# Patient Record
Sex: Male | Born: 1960 | Race: White | Hispanic: No | Smoking: Never smoker
Health system: Southern US, Community
[De-identification: ages and names within clinical notes are randomized; demographics above are authoritative.]

## PROBLEM LIST (undated history)

## (undated) DIAGNOSIS — R030 Elevated blood-pressure reading, without diagnosis of hypertension: Secondary | ICD-10-CM

## (undated) DIAGNOSIS — R7301 Impaired fasting glucose: Secondary | ICD-10-CM

## (undated) DIAGNOSIS — E782 Mixed hyperlipidemia: Secondary | ICD-10-CM

## (undated) DIAGNOSIS — K219 Gastro-esophageal reflux disease without esophagitis: Secondary | ICD-10-CM

## (undated) DIAGNOSIS — I10 Essential (primary) hypertension: Secondary | ICD-10-CM

## (undated) DIAGNOSIS — R972 Elevated prostate specific antigen [PSA]: Secondary | ICD-10-CM

## (undated) DIAGNOSIS — I639 Cerebral infarction, unspecified: Secondary | ICD-10-CM

## (undated) DIAGNOSIS — R079 Chest pain, unspecified: Secondary | ICD-10-CM

## (undated) HISTORY — DX: Chest pain, unspecified: R07.9

## (undated) HISTORY — DX: Elevated blood-pressure reading, without diagnosis of hypertension: R03.0

## (undated) HISTORY — DX: Mixed hyperlipidemia: E78.2

## (undated) HISTORY — DX: Elevated prostate specific antigen (PSA): R97.20

## (undated) HISTORY — DX: Impaired fasting glucose: R73.01

---

## 2017-10-24 DIAGNOSIS — R079 Chest pain, unspecified: Secondary | ICD-10-CM | POA: Insufficient documentation

## 2017-10-24 DIAGNOSIS — R7301 Impaired fasting glucose: Secondary | ICD-10-CM

## 2017-10-24 DIAGNOSIS — E782 Mixed hyperlipidemia: Secondary | ICD-10-CM

## 2017-10-24 DIAGNOSIS — R030 Elevated blood-pressure reading, without diagnosis of hypertension: Secondary | ICD-10-CM

## 2017-10-24 DIAGNOSIS — R972 Elevated prostate specific antigen [PSA]: Secondary | ICD-10-CM

## 2017-10-24 HISTORY — DX: Impaired fasting glucose: R73.01

## 2017-10-24 HISTORY — DX: Elevated prostate specific antigen (PSA): R97.20

## 2017-10-24 HISTORY — DX: Chest pain, unspecified: R07.9

## 2017-10-24 HISTORY — DX: Mixed hyperlipidemia: E78.2

## 2017-10-24 HISTORY — DX: Elevated blood-pressure reading, without diagnosis of hypertension: R03.0

## 2017-10-25 ENCOUNTER — Ambulatory Visit: Payer: Self-pay | Admitting: Cardiology

## 2021-12-25 ENCOUNTER — Inpatient Hospital Stay (HOSPITAL_COMMUNITY)
Admission: EM | Admit: 2021-12-25 | Discharge: 2022-01-15 | DRG: 003 | Disposition: A | Discharge status: Other Acute Inpatient Hospital | Payer: Managed Care, Other (non HMO) | Attending: Internal Medicine | Admitting: Internal Medicine

## 2021-12-25 ENCOUNTER — Inpatient Hospital Stay (HOSPITAL_COMMUNITY): Payer: Managed Care, Other (non HMO)

## 2021-12-25 ENCOUNTER — Emergency Department (HOSPITAL_COMMUNITY): Payer: Managed Care, Other (non HMO) | Admitting: Certified Registered Nurse Anesthetist

## 2021-12-25 ENCOUNTER — Emergency Department (HOSPITAL_COMMUNITY): Payer: Managed Care, Other (non HMO)

## 2021-12-25 ENCOUNTER — Encounter (HOSPITAL_COMMUNITY)
Admission: EM | Disposition: A | Discharge status: Nursing Home (Includes ICF) | Payer: Self-pay | Source: Home / Self Care | Attending: Neurology

## 2021-12-25 DIAGNOSIS — K219 Gastro-esophageal reflux disease without esophagitis: Secondary | ICD-10-CM | POA: Diagnosis present

## 2021-12-25 DIAGNOSIS — N170 Acute kidney failure with tubular necrosis: Secondary | ICD-10-CM | POA: Diagnosis present

## 2021-12-25 DIAGNOSIS — G8194 Hemiplegia, unspecified affecting left nondominant side: Secondary | ICD-10-CM | POA: Diagnosis not present

## 2021-12-25 DIAGNOSIS — R509 Fever, unspecified: Secondary | ICD-10-CM | POA: Diagnosis not present

## 2021-12-25 DIAGNOSIS — D62 Acute posthemorrhagic anemia: Secondary | ICD-10-CM | POA: Diagnosis present

## 2021-12-25 DIAGNOSIS — D649 Anemia, unspecified: Secondary | ICD-10-CM | POA: Diagnosis not present

## 2021-12-25 DIAGNOSIS — N400 Enlarged prostate without lower urinary tract symptoms: Secondary | ICD-10-CM | POA: Diagnosis present

## 2021-12-25 DIAGNOSIS — F419 Anxiety disorder, unspecified: Secondary | ICD-10-CM | POA: Diagnosis present

## 2021-12-25 DIAGNOSIS — R7303 Prediabetes: Secondary | ICD-10-CM | POA: Diagnosis present

## 2021-12-25 DIAGNOSIS — R414 Neurologic neglect syndrome: Secondary | ICD-10-CM | POA: Diagnosis present

## 2021-12-25 DIAGNOSIS — I330 Acute and subacute infective endocarditis: Secondary | ICD-10-CM | POA: Diagnosis present

## 2021-12-25 DIAGNOSIS — Z6830 Body mass index (BMI) 30.0-30.9, adult: Secondary | ICD-10-CM

## 2021-12-25 DIAGNOSIS — D500 Iron deficiency anemia secondary to blood loss (chronic): Secondary | ICD-10-CM | POA: Diagnosis not present

## 2021-12-25 DIAGNOSIS — I10 Essential (primary) hypertension: Secondary | ICD-10-CM | POA: Diagnosis present

## 2021-12-25 DIAGNOSIS — R5381 Other malaise: Secondary | ICD-10-CM | POA: Diagnosis present

## 2021-12-25 DIAGNOSIS — R233 Spontaneous ecchymoses: Secondary | ICD-10-CM | POA: Diagnosis not present

## 2021-12-25 DIAGNOSIS — R972 Elevated prostate specific antigen [PSA]: Secondary | ICD-10-CM | POA: Diagnosis not present

## 2021-12-25 DIAGNOSIS — B9561 Methicillin susceptible Staphylococcus aureus infection as the cause of diseases classified elsewhere: Secondary | ICD-10-CM | POA: Diagnosis not present

## 2021-12-25 DIAGNOSIS — G9340 Encephalopathy, unspecified: Secondary | ICD-10-CM | POA: Diagnosis present

## 2021-12-25 DIAGNOSIS — K567 Ileus, unspecified: Secondary | ICD-10-CM | POA: Diagnosis present

## 2021-12-25 DIAGNOSIS — I248 Other forms of acute ischemic heart disease: Secondary | ICD-10-CM | POA: Diagnosis present

## 2021-12-25 DIAGNOSIS — R34 Anuria and oliguria: Secondary | ICD-10-CM | POA: Diagnosis not present

## 2021-12-25 DIAGNOSIS — D72829 Elevated white blood cell count, unspecified: Secondary | ICD-10-CM

## 2021-12-25 DIAGNOSIS — R6521 Severe sepsis with septic shock: Secondary | ICD-10-CM | POA: Diagnosis present

## 2021-12-25 DIAGNOSIS — E669 Obesity, unspecified: Secondary | ICD-10-CM | POA: Diagnosis present

## 2021-12-25 DIAGNOSIS — I63411 Cerebral infarction due to embolism of right middle cerebral artery: Secondary | ICD-10-CM | POA: Diagnosis present

## 2021-12-25 DIAGNOSIS — Z930 Tracheostomy status: Secondary | ICD-10-CM | POA: Diagnosis not present

## 2021-12-25 DIAGNOSIS — R197 Diarrhea, unspecified: Secondary | ICD-10-CM | POA: Diagnosis present

## 2021-12-25 DIAGNOSIS — R5082 Postprocedural fever: Secondary | ICD-10-CM | POA: Diagnosis not present

## 2021-12-25 DIAGNOSIS — I38 Endocarditis, valve unspecified: Secondary | ICD-10-CM | POA: Diagnosis not present

## 2021-12-25 DIAGNOSIS — I639 Cerebral infarction, unspecified: Secondary | ICD-10-CM | POA: Diagnosis not present

## 2021-12-25 DIAGNOSIS — K56609 Unspecified intestinal obstruction, unspecified as to partial versus complete obstruction: Secondary | ICD-10-CM | POA: Diagnosis not present

## 2021-12-25 DIAGNOSIS — J189 Pneumonia, unspecified organism: Secondary | ICD-10-CM | POA: Diagnosis not present

## 2021-12-25 DIAGNOSIS — I63511 Cerebral infarction due to unspecified occlusion or stenosis of right middle cerebral artery: Secondary | ICD-10-CM

## 2021-12-25 DIAGNOSIS — N412 Abscess of prostate: Secondary | ICD-10-CM | POA: Diagnosis present

## 2021-12-25 DIAGNOSIS — E875 Hyperkalemia: Secondary | ICD-10-CM | POA: Diagnosis present

## 2021-12-25 DIAGNOSIS — K449 Diaphragmatic hernia without obstruction or gangrene: Secondary | ICD-10-CM | POA: Diagnosis present

## 2021-12-25 DIAGNOSIS — Z20822 Contact with and (suspected) exposure to covid-19: Secondary | ICD-10-CM | POA: Diagnosis present

## 2021-12-25 DIAGNOSIS — A4101 Sepsis due to Methicillin susceptible Staphylococcus aureus: Secondary | ICD-10-CM | POA: Diagnosis present

## 2021-12-25 DIAGNOSIS — K317 Polyp of stomach and duodenum: Secondary | ICD-10-CM | POA: Diagnosis present

## 2021-12-25 DIAGNOSIS — E878 Other disorders of electrolyte and fluid balance, not elsewhere classified: Secondary | ICD-10-CM | POA: Diagnosis not present

## 2021-12-25 DIAGNOSIS — R471 Dysarthria and anarthria: Secondary | ICD-10-CM | POA: Diagnosis present

## 2021-12-25 DIAGNOSIS — D638 Anemia in other chronic diseases classified elsewhere: Secondary | ICD-10-CM | POA: Diagnosis present

## 2021-12-25 DIAGNOSIS — I059 Rheumatic mitral valve disease, unspecified: Secondary | ICD-10-CM | POA: Diagnosis not present

## 2021-12-25 DIAGNOSIS — K2971 Gastritis, unspecified, with bleeding: Secondary | ICD-10-CM | POA: Diagnosis present

## 2021-12-25 DIAGNOSIS — I959 Hypotension, unspecified: Secondary | ICD-10-CM | POA: Diagnosis not present

## 2021-12-25 DIAGNOSIS — R4701 Aphasia: Secondary | ICD-10-CM | POA: Diagnosis present

## 2021-12-25 DIAGNOSIS — J90 Pleural effusion, not elsewhere classified: Secondary | ICD-10-CM | POA: Diagnosis present

## 2021-12-25 DIAGNOSIS — I340 Nonrheumatic mitral (valve) insufficiency: Secondary | ICD-10-CM | POA: Diagnosis not present

## 2021-12-25 DIAGNOSIS — I6601 Occlusion and stenosis of right middle cerebral artery: Secondary | ICD-10-CM | POA: Diagnosis present

## 2021-12-25 DIAGNOSIS — N179 Acute kidney failure, unspecified: Secondary | ICD-10-CM | POA: Diagnosis present

## 2021-12-25 DIAGNOSIS — E8770 Fluid overload, unspecified: Secondary | ICD-10-CM | POA: Diagnosis not present

## 2021-12-25 DIAGNOSIS — R29700 NIHSS score 0: Secondary | ICD-10-CM | POA: Diagnosis present

## 2021-12-25 DIAGNOSIS — I63442 Cerebral infarction due to embolism of left cerebellar artery: Secondary | ICD-10-CM | POA: Diagnosis present

## 2021-12-25 DIAGNOSIS — K921 Melena: Secondary | ICD-10-CM | POA: Diagnosis not present

## 2021-12-25 DIAGNOSIS — Z7982 Long term (current) use of aspirin: Secondary | ICD-10-CM

## 2021-12-25 DIAGNOSIS — I611 Nontraumatic intracerebral hemorrhage in hemisphere, cortical: Secondary | ICD-10-CM | POA: Diagnosis present

## 2021-12-25 DIAGNOSIS — H53462 Homonymous bilateral field defects, left side: Secondary | ICD-10-CM | POA: Diagnosis present

## 2021-12-25 DIAGNOSIS — E43 Unspecified severe protein-calorie malnutrition: Secondary | ICD-10-CM | POA: Diagnosis present

## 2021-12-25 DIAGNOSIS — J9601 Acute respiratory failure with hypoxia: Secondary | ICD-10-CM | POA: Diagnosis present

## 2021-12-25 DIAGNOSIS — R000 Tachycardia, unspecified: Secondary | ICD-10-CM | POA: Diagnosis present

## 2021-12-25 DIAGNOSIS — E782 Mixed hyperlipidemia: Secondary | ICD-10-CM | POA: Diagnosis present

## 2021-12-25 DIAGNOSIS — J690 Pneumonitis due to inhalation of food and vomit: Secondary | ICD-10-CM | POA: Diagnosis present

## 2021-12-25 DIAGNOSIS — R1310 Dysphagia, unspecified: Secondary | ICD-10-CM | POA: Diagnosis present

## 2021-12-25 DIAGNOSIS — Z79899 Other long term (current) drug therapy: Secondary | ICD-10-CM

## 2021-12-25 DIAGNOSIS — K648 Other hemorrhoids: Secondary | ICD-10-CM | POA: Diagnosis present

## 2021-12-25 DIAGNOSIS — R609 Edema, unspecified: Secondary | ICD-10-CM | POA: Diagnosis not present

## 2021-12-25 DIAGNOSIS — I339 Acute and subacute endocarditis, unspecified: Secondary | ICD-10-CM | POA: Diagnosis not present

## 2021-12-25 DIAGNOSIS — A419 Sepsis, unspecified organism: Secondary | ICD-10-CM | POA: Diagnosis not present

## 2021-12-25 DIAGNOSIS — M25461 Effusion, right knee: Secondary | ICD-10-CM | POA: Diagnosis present

## 2021-12-25 DIAGNOSIS — E861 Hypovolemia: Secondary | ICD-10-CM | POA: Diagnosis present

## 2021-12-25 DIAGNOSIS — K922 Gastrointestinal hemorrhage, unspecified: Secondary | ICD-10-CM | POA: Diagnosis not present

## 2021-12-25 DIAGNOSIS — R739 Hyperglycemia, unspecified: Secondary | ICD-10-CM | POA: Diagnosis present

## 2021-12-25 DIAGNOSIS — I3139 Other pericardial effusion (noninflammatory): Secondary | ICD-10-CM | POA: Diagnosis not present

## 2021-12-25 DIAGNOSIS — I76 Septic arterial embolism: Secondary | ICD-10-CM | POA: Diagnosis present

## 2021-12-25 DIAGNOSIS — G9341 Metabolic encephalopathy: Secondary | ICD-10-CM | POA: Diagnosis present

## 2021-12-25 DIAGNOSIS — F05 Delirium due to known physiological condition: Secondary | ICD-10-CM | POA: Diagnosis not present

## 2021-12-25 DIAGNOSIS — E870 Hyperosmolality and hypernatremia: Secondary | ICD-10-CM | POA: Diagnosis not present

## 2021-12-25 DIAGNOSIS — K3189 Other diseases of stomach and duodenum: Secondary | ICD-10-CM | POA: Diagnosis not present

## 2021-12-25 DIAGNOSIS — J9811 Atelectasis: Secondary | ICD-10-CM | POA: Diagnosis not present

## 2021-12-25 DIAGNOSIS — E8809 Other disorders of plasma-protein metabolism, not elsewhere classified: Secondary | ICD-10-CM | POA: Diagnosis present

## 2021-12-25 DIAGNOSIS — K56600 Partial intestinal obstruction, unspecified as to cause: Secondary | ICD-10-CM | POA: Diagnosis present

## 2021-12-25 DIAGNOSIS — H538 Other visual disturbances: Secondary | ICD-10-CM | POA: Diagnosis present

## 2021-12-25 DIAGNOSIS — R29810 Facial weakness: Secondary | ICD-10-CM | POA: Diagnosis present

## 2021-12-25 DIAGNOSIS — J181 Lobar pneumonia, unspecified organism: Secondary | ICD-10-CM | POA: Diagnosis not present

## 2021-12-25 HISTORY — PX: IR PERCUTANEOUS ART THROMBECTOMY/INFUSION INTRACRANIAL INC DIAG ANGIO: IMG6087

## 2021-12-25 HISTORY — PX: IR CT HEAD LTD: IMG2386

## 2021-12-25 HISTORY — PX: RADIOLOGY WITH ANESTHESIA: SHX6223

## 2021-12-25 LAB — PROCALCITONIN: Procalcitonin: 2.95 ng/mL

## 2021-12-25 LAB — PROTIME-INR
INR: 1.2 (ref 0.8–1.2)
INR: 1.3 — ABNORMAL HIGH (ref 0.8–1.2)
Prothrombin Time: 14.9 seconds (ref 11.4–15.2)
Prothrombin Time: 15.9 seconds — ABNORMAL HIGH (ref 11.4–15.2)

## 2021-12-25 LAB — DIFFERENTIAL
Abs Immature Granulocytes: 0.71 10*3/uL — ABNORMAL HIGH (ref 0.00–0.07)
Basophils Absolute: 0.1 10*3/uL (ref 0.0–0.1)
Basophils Relative: 0 %
Eosinophils Absolute: 0.1 10*3/uL (ref 0.0–0.5)
Eosinophils Relative: 0 %
Immature Granulocytes: 4 %
Lymphocytes Relative: 7 %
Lymphs Abs: 1.4 10*3/uL (ref 0.7–4.0)
Monocytes Absolute: 1.4 10*3/uL — ABNORMAL HIGH (ref 0.1–1.0)
Monocytes Relative: 7 %
Neutro Abs: 16.3 10*3/uL — ABNORMAL HIGH (ref 1.7–7.7)
Neutrophils Relative %: 82 %

## 2021-12-25 LAB — POCT I-STAT 7, (LYTES, BLD GAS, ICA,H+H)
Acid-base deficit: 1 mmol/L (ref 0.0–2.0)
Acid-base deficit: 2 mmol/L (ref 0.0–2.0)
Bicarbonate: 22 mmol/L (ref 20.0–28.0)
Bicarbonate: 21.5 mmol/L (ref 20.0–28.0)
Calcium, Ion: 1 mmol/L — ABNORMAL LOW (ref 1.15–1.40)
Calcium, Ion: 0.95 mmol/L — ABNORMAL LOW (ref 1.15–1.40)
HCT: 19 % — ABNORMAL LOW (ref 39.0–52.0)
HCT: 20 % — ABNORMAL LOW (ref 39.0–52.0)
Hemoglobin: 6.5 g/dL — CL (ref 13.0–17.0)
Hemoglobin: 6.8 g/dL — CL (ref 13.0–17.0)
O2 Saturation: 100 %
O2 Saturation: 100 %
Patient temperature: 98.6
Patient temperature: 98.6
Potassium: 6.1 mmol/L — ABNORMAL HIGH (ref 3.5–5.1)
Potassium: 5.7 mmol/L — ABNORMAL HIGH (ref 3.5–5.1)
Sodium: 137 mmol/L (ref 135–145)
Sodium: 137 mmol/L (ref 135–145)
TCO2: 23 mmol/L (ref 22–32)
TCO2: 22 mmol/L (ref 22–32)
pCO2 arterial: 29.4 mmHg — ABNORMAL LOW (ref 32–48)
pCO2 arterial: 30.4 mmHg — ABNORMAL LOW (ref 32–48)
pH, Arterial: 7.482 — ABNORMAL HIGH (ref 7.35–7.45)
pH, Arterial: 7.457 — ABNORMAL HIGH (ref 7.35–7.45)
pO2, Arterial: 405 mmHg — ABNORMAL HIGH (ref 83–108)
pO2, Arterial: 177 mmHg — ABNORMAL HIGH (ref 83–108)

## 2021-12-25 LAB — TROPONIN I (HIGH SENSITIVITY)
Troponin I (High Sensitivity): 337 ng/L (ref <18)
Troponin I (High Sensitivity): 315 ng/L (ref <18)

## 2021-12-25 LAB — BASIC METABOLIC PANEL
Anion gap: 6 (ref 5–15)
Anion gap: 8 (ref 5–15)
BUN: 36 mg/dL — ABNORMAL HIGH (ref 6–20)
BUN: 40 mg/dL — ABNORMAL HIGH (ref 6–20)
CO2: 21 mmol/L — ABNORMAL LOW (ref 22–32)
CO2: 19 mmol/L — ABNORMAL LOW (ref 22–32)
Calcium: 6.8 mg/dL — ABNORMAL LOW (ref 8.9–10.3)
Calcium: 6.7 mg/dL — ABNORMAL LOW (ref 8.9–10.3)
Chloride: 112 mmol/L — ABNORMAL HIGH (ref 98–111)
Chloride: 113 mmol/L — ABNORMAL HIGH (ref 98–111)
Creatinine, Ser: 1.73 mg/dL — ABNORMAL HIGH (ref 0.61–1.24)
Creatinine, Ser: 1.98 mg/dL — ABNORMAL HIGH (ref 0.61–1.24)
GFR, Estimated: 45 mL/min — ABNORMAL LOW (ref >60)
GFR, Estimated: 38 mL/min — ABNORMAL LOW (ref >60)
Glucose, Bld: 242 mg/dL — ABNORMAL HIGH (ref 70–99)
Glucose, Bld: 241 mg/dL — ABNORMAL HIGH (ref 70–99)
Potassium: 5.5 mmol/L — ABNORMAL HIGH (ref 3.5–5.1)
Potassium: 6.5 mmol/L (ref 3.5–5.1)
Sodium: 139 mmol/L (ref 135–145)
Sodium: 140 mmol/L (ref 135–145)

## 2021-12-25 LAB — HIV ANTIBODY (ROUTINE TESTING W REFLEX): HIV Screen 4th Generation wRfx: NONREACTIVE

## 2021-12-25 LAB — PREPARE RBC (CROSSMATCH)

## 2021-12-25 LAB — URINALYSIS, ROUTINE W REFLEX MICROSCOPIC
Bilirubin Urine: NEGATIVE
Glucose, UA: NEGATIVE mg/dL
Ketones, ur: NEGATIVE mg/dL
Leukocytes,Ua: NEGATIVE
Nitrite: NEGATIVE
Protein, ur: NEGATIVE mg/dL
Specific Gravity, Urine: 1.034 — ABNORMAL HIGH (ref 1.005–1.030)
pH: 5 (ref 5.0–8.0)

## 2021-12-25 LAB — CBC
HCT: 35 % — ABNORMAL LOW (ref 39.0–52.0)
HCT: 26.8 % — ABNORMAL LOW (ref 39.0–52.0)
Hemoglobin: 11.8 g/dL — ABNORMAL LOW (ref 13.0–17.0)
Hemoglobin: 9.2 g/dL — ABNORMAL LOW (ref 13.0–17.0)
MCH: 30.7 pg (ref 26.0–34.0)
MCH: 31.4 pg (ref 26.0–34.0)
MCHC: 33.7 g/dL (ref 30.0–36.0)
MCHC: 34.3 g/dL (ref 30.0–36.0)
MCV: 91.1 fL (ref 80.0–100.0)
MCV: 91.5 fL (ref 80.0–100.0)
Platelets: 199 10*3/uL (ref 150–400)
Platelets: 205 10*3/uL (ref 150–400)
RBC: 3.84 MIL/uL — ABNORMAL LOW (ref 4.22–5.81)
RBC: 2.93 MIL/uL — ABNORMAL LOW (ref 4.22–5.81)
RDW: 13.9 % (ref 11.5–15.5)
RDW: 14.1 % (ref 11.5–15.5)
WBC: 20 10*3/uL — ABNORMAL HIGH (ref 4.0–10.5)
WBC: 20.6 10*3/uL — ABNORMAL HIGH (ref 4.0–10.5)
nRBC: 0 % (ref 0.0–0.2)
nRBC: 0 % (ref 0.0–0.2)

## 2021-12-25 LAB — COMPREHENSIVE METABOLIC PANEL
ALT: 34 U/L (ref 0–44)
AST: 29 U/L (ref 15–41)
Albumin: 2.1 g/dL — ABNORMAL LOW (ref 3.5–5.0)
Alkaline Phosphatase: 100 U/L (ref 38–126)
Anion gap: 9 (ref 5–15)
BUN: 34 mg/dL — ABNORMAL HIGH (ref 6–20)
CO2: 21 mmol/L — ABNORMAL LOW (ref 22–32)
Calcium: 7.9 mg/dL — ABNORMAL LOW (ref 8.9–10.3)
Chloride: 108 mmol/L (ref 98–111)
Creatinine, Ser: 1.83 mg/dL — ABNORMAL HIGH (ref 0.61–1.24)
GFR, Estimated: 42 mL/min — ABNORMAL LOW (ref >60)
Glucose, Bld: 138 mg/dL — ABNORMAL HIGH (ref 70–99)
Potassium: 4.9 mmol/L (ref 3.5–5.1)
Sodium: 138 mmol/L (ref 135–145)
Total Bilirubin: 0.5 mg/dL (ref 0.3–1.2)
Total Protein: 5.7 g/dL — ABNORMAL LOW (ref 6.5–8.1)

## 2021-12-25 LAB — LACTIC ACID, PLASMA
Lactic Acid, Venous: 1.5 mmol/L (ref 0.5–1.9)
Lactic Acid, Venous: 1.5 mmol/L (ref 0.5–1.9)
Lactic Acid, Venous: 3 mmol/L (ref 0.5–1.9)

## 2021-12-25 LAB — RESP PANEL BY RT-PCR (FLU A&B, COVID) ARPGX2
Influenza A by PCR: NEGATIVE
Influenza B by PCR: NEGATIVE
SARS Coronavirus 2 by RT PCR: NEGATIVE

## 2021-12-25 LAB — I-STAT CHEM 8, ED
BUN: 34 mg/dL — ABNORMAL HIGH (ref 6–20)
Calcium, Ion: 1.03 mmol/L — ABNORMAL LOW (ref 1.15–1.40)
Chloride: 108 mmol/L (ref 98–111)
Creatinine, Ser: 1.9 mg/dL — ABNORMAL HIGH (ref 0.61–1.24)
Glucose, Bld: 139 mg/dL — ABNORMAL HIGH (ref 70–99)
HCT: 35 % — ABNORMAL LOW (ref 39.0–52.0)
Hemoglobin: 11.9 g/dL — ABNORMAL LOW (ref 13.0–17.0)
Potassium: 4.8 mmol/L (ref 3.5–5.1)
Sodium: 139 mmol/L (ref 135–145)
TCO2: 21 mmol/L — ABNORMAL LOW (ref 22–32)

## 2021-12-25 LAB — MRSA NEXT GEN BY PCR, NASAL: MRSA by PCR Next Gen: NOT DETECTED

## 2021-12-25 LAB — HEMOGLOBIN AND HEMATOCRIT, BLOOD
HCT: 25.6 % — ABNORMAL LOW (ref 39.0–52.0)
HCT: 21.1 % — ABNORMAL LOW (ref 39.0–52.0)
HCT: 27.3 % — ABNORMAL LOW (ref 39.0–52.0)
Hemoglobin: 8.8 g/dL — ABNORMAL LOW (ref 13.0–17.0)
Hemoglobin: 7 g/dL — ABNORMAL LOW (ref 13.0–17.0)
Hemoglobin: 9.5 g/dL — ABNORMAL LOW (ref 13.0–17.0)

## 2021-12-25 LAB — ABO/RH: ABO/RH(D): O POS

## 2021-12-25 LAB — HEMOGLOBIN A1C
Hgb A1c MFr Bld: 6 % — ABNORMAL HIGH (ref 4.8–5.6)
Mean Plasma Glucose: 125.5 mg/dL

## 2021-12-25 LAB — APTT
aPTT: 28 seconds (ref 24–36)
aPTT: 32 seconds (ref 24–36)

## 2021-12-25 LAB — GLUCOSE, CAPILLARY
Glucose-Capillary: 186 mg/dL — ABNORMAL HIGH (ref 70–99)
Glucose-Capillary: 250 mg/dL — ABNORMAL HIGH (ref 70–99)
Glucose-Capillary: 290 mg/dL — ABNORMAL HIGH (ref 70–99)

## 2021-12-25 LAB — CBG MONITORING, ED: Glucose-Capillary: 135 mg/dL — ABNORMAL HIGH (ref 70–99)

## 2021-12-25 LAB — PLATELET COUNT: Platelets: 187 10*3/uL (ref 150–400)

## 2021-12-25 LAB — SEDIMENTATION RATE: Sed Rate: 50 mm/hr — ABNORMAL HIGH (ref 0–16)

## 2021-12-25 IMAGING — CT CT ANGIO HEAD-NECK (W OR W/O PERF)
2 of 7 series · 8 of 33 positions shown · IV contrast (OMNI 350)
Comparison: Plain head CT reported separately.

CLINICAL DATA: Code stroke. 60-year-old male with right MCA
syndrome; gaze deviation and left side weakness.

EXAM:
CT ANGIOGRAPHY HEAD AND NECK
TECHNIQUE: Multidetector CT imaging of the head and neck was performed using
the standard protocol during bolus administration of intravenous
contrast. Multiplanar CT image reconstructions and MIPs were
obtained to evaluate the vascular anatomy. Carotid stenosis
measurements (when applicable) are obtained utilizing NASCET
criteria, using the distal internal carotid diameter as the
denominator.

[Series 5: cta neck · axial · 0.52mm/px · z∈[-262,-134]mm · 2 of 194 slices shown]
[im 65/194  soft-tissue]
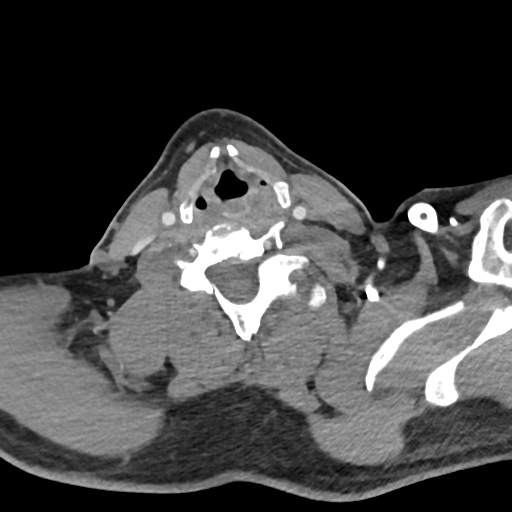
[im 129/194  soft-tissue]
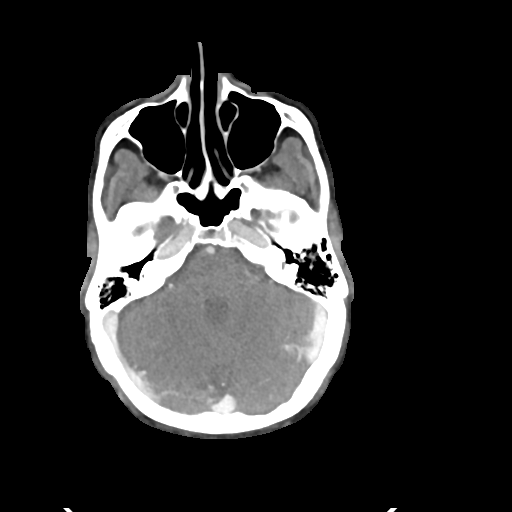

[Series 7: cta neck axial · axial · 0.39mm/px · z∈[-336,-63]mm · 6 of 383 slices shown]
[im 55/383  soft-tissue]
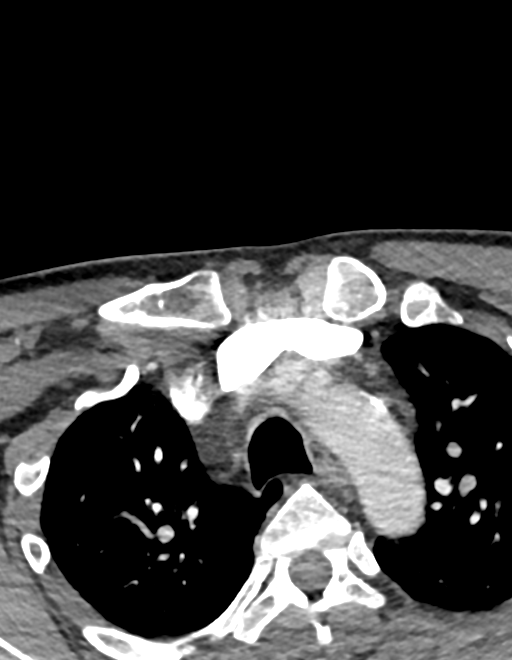
[im 110/383  bone]
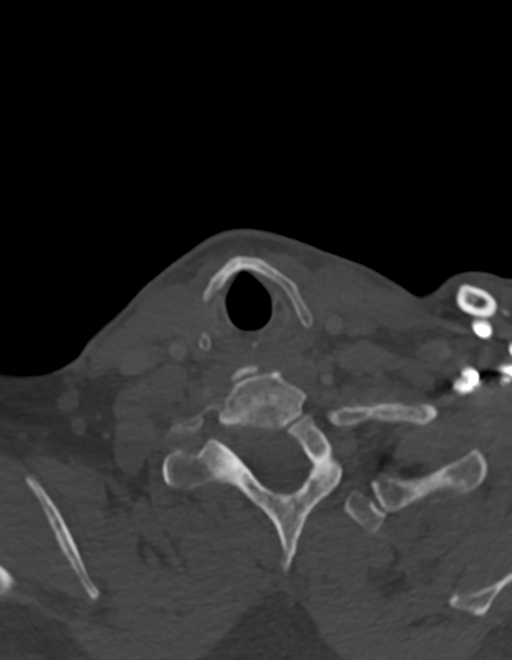
[im 164/383  soft-tissue]
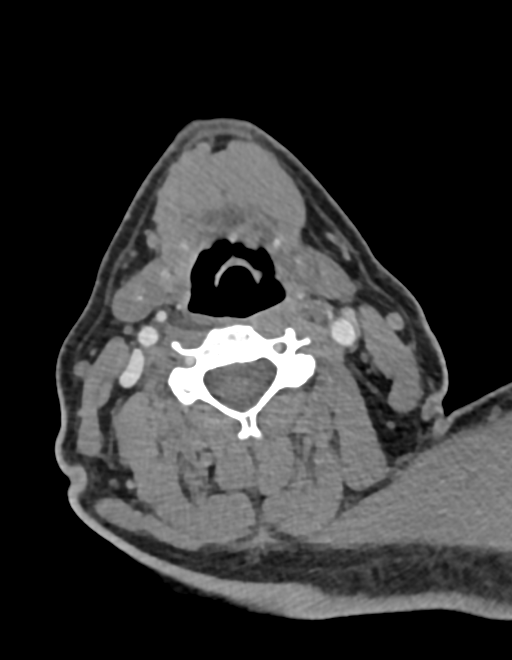
[im 219/383  bone]
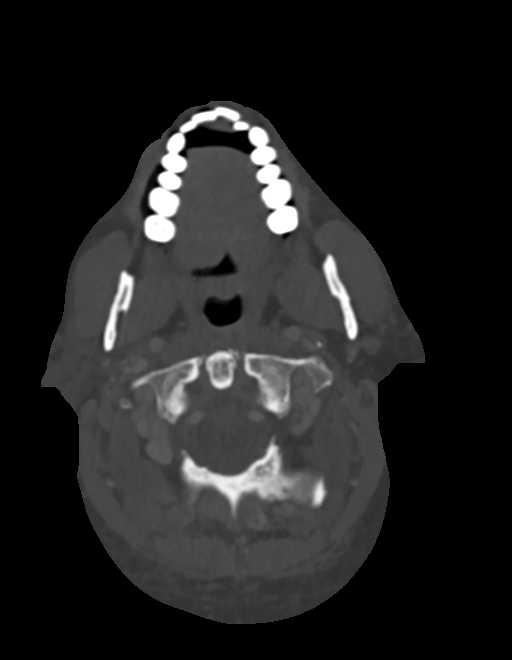
[im 273/383  soft-tissue]
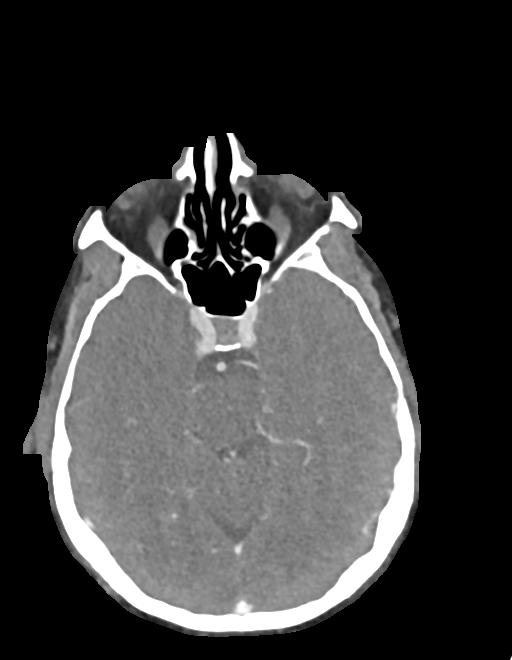
[im 328/383  bone]
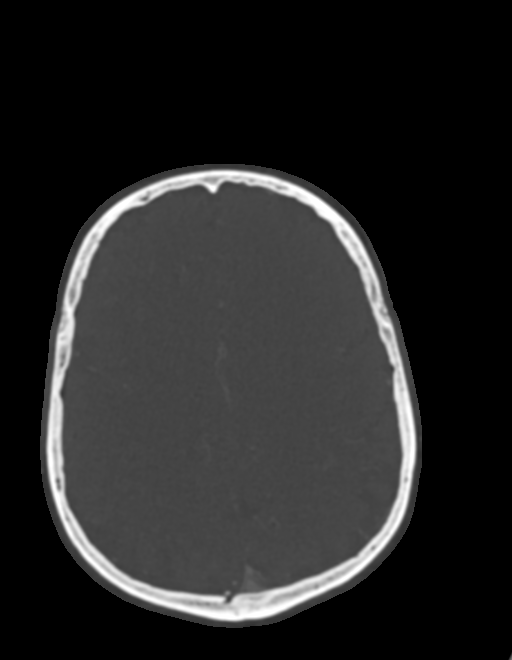

[8 of 33 positions shown; findings below may reference images not displayed]

RADIATION DOSE REDUCTION: This exam was performed according to the
departmental dose-optimization program which includes automated
exposure control, adjustment of the mA and/or kV according to
patient size and/or use of iterative reconstruction technique.

CONTRAST:  100mL OMNIPAQUE IOHEXOL 350 MG/ML SOLN
FINDINGS: CTA NECK

Skeleton: No acute osseous abnormality identified. There is
levoconvex scoliosis.

Upper chest: Mild atelectasis and gas trapping in the upper lungs.
Negative visible superior mediastinum. Visible central pulmonary
arteries appear to be patent.

Other neck: Negative.

Aortic arch: Calcified aortic atherosclerosis. 3 vessel arch
configuration.

Right carotid system: Negative brachiocephalic and right CCA origin.
Negative right CCA and carotid bifurcation. Tortuous right ICA just
below the skull base with no plaque or stenosis.

Left carotid system: Similar tortuosity with no plaque or stenosis.

Vertebral arteries:
Mild calcified plaque at the right vertebral artery origin but no
significant stenosis (series 8, image 93). Right vertebral artery
appears patent and normal to the skull base.

Normal proximal left subclavian artery and left vertebral artery
origin. Codominant left vertebral artery appears patent and normal
to the skull base.

CTA HEAD

Posterior circulation: Codominant distal vertebral arteries are
patent to the vertebrobasilar junction with no plaque or stenosis.
Mild heterogeneity of the proximal basilar artery seen on series 7,
image 131 is felt to be streak artifact. Patent right AICA near this
level. Elsewhere the basilar artery appears patent and within normal
limits. SCA and PCA origins are patent, there is a fetal type left
PCA origin. Bilateral PCA branches are within normal limits.

Anterior circulation: Both ICA siphons are patent. No significant
siphon plaque or stenosis. Normal right ophthalmic and left
posterior communicating artery origins. Patent carotid termini, MCA
and ACA origins. Dominant right ACA A1. Anterior communicating
artery and bilateral ACA branches are within normal limits. Left MCA
M1 segment and bifurcation are patent without stenosis. Left MCA
branches are within normal limits.

Right MCA M1 segment remains patent. Patent anterior temporal artery
origin. Just beyond the right MCA bifurcation the anterior division
is thrombosed. See series 9, images 59-56. Posterior MCA division
appears relatively spared. Intermediate level of collateral branch
enhancement.

Venous sinuses: Patent.

Anatomic variants: Fetal left PCA origin.  Dominant right ACA A1.

Review of the MIP images confirms the above findings
IMPRESSION: 1. Positive for Emergent Large Vessel Occlusion: Right MCA anterior
M2.
This was discussed by telephone with Dr. CEOLA initially
at [ZT] hours. CTP pending.

2. No other large vessel occlusion and no significant
atherosclerosis or stenosis elsewhere in the head or neck.

3. Aortic Atherosclerosis ([ZT]-[ZT]).

## 2021-12-25 IMAGING — DX DG ABDOMEN 1V
1 series · 1 of 1 positions shown · non-contrast
Comparison: None.

CLINICAL DATA: Orogastric tube placement.

EXAM:
ABDOMEN - 1 VIEW

[abdomen]
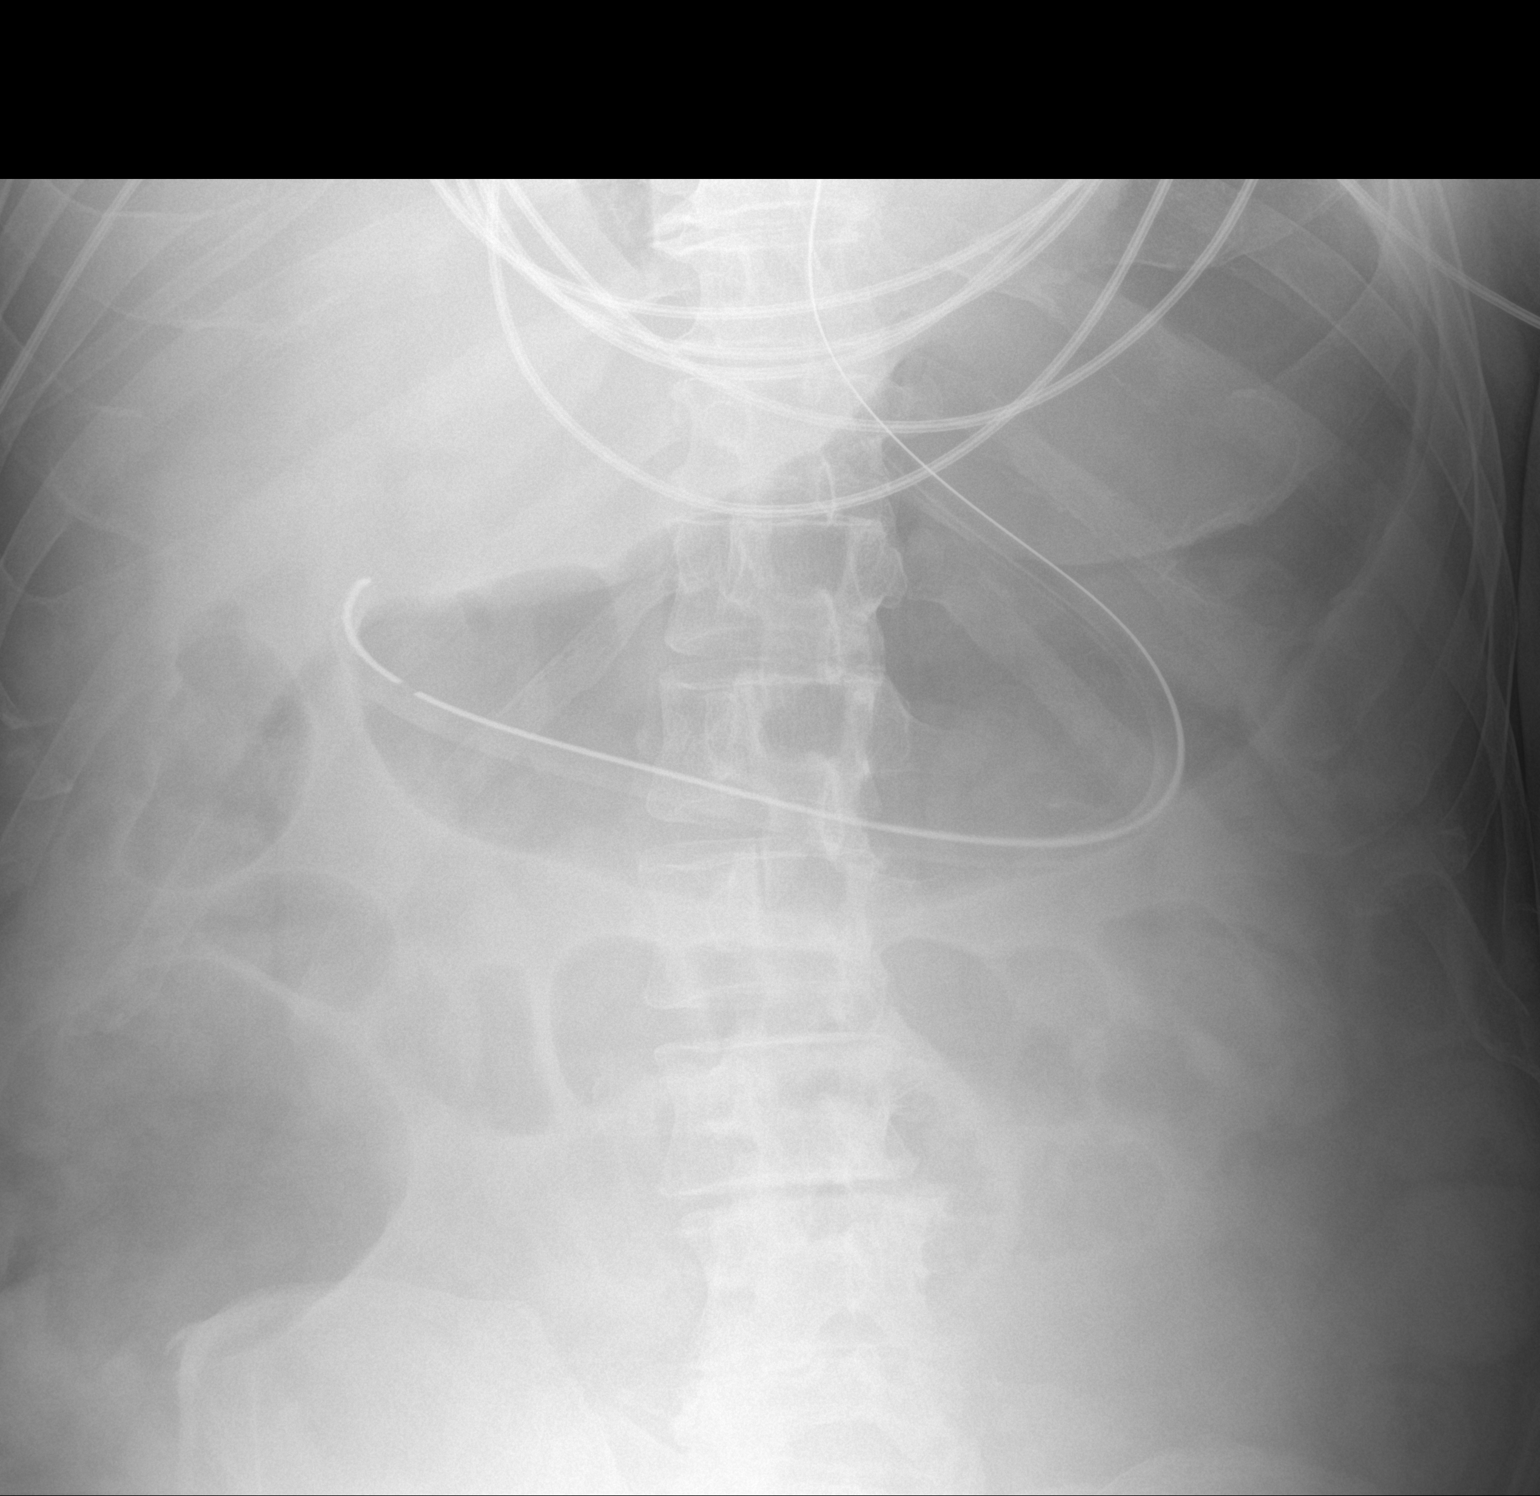

[1 of 1 positions shown; findings below may reference images not displayed]

FINDINGS: An orogastric tube is seen with its distal tip overlying the
expected region of the gastric antrum. The bowel gas pattern is
normal. No radio-opaque calculi or other significant radiographic
abnormality are seen.
IMPRESSION: Orogastric tube positioning, as described above.

## 2021-12-25 IMAGING — DX DG CHEST 1V PORT
1 series · 1 of 1 positions shown · non-contrast
Comparison: [DATE]

CLINICAL DATA: Evaluate for pneumonia.

EXAM:
PORTABLE CHEST 1 VIEW

[chest ap]
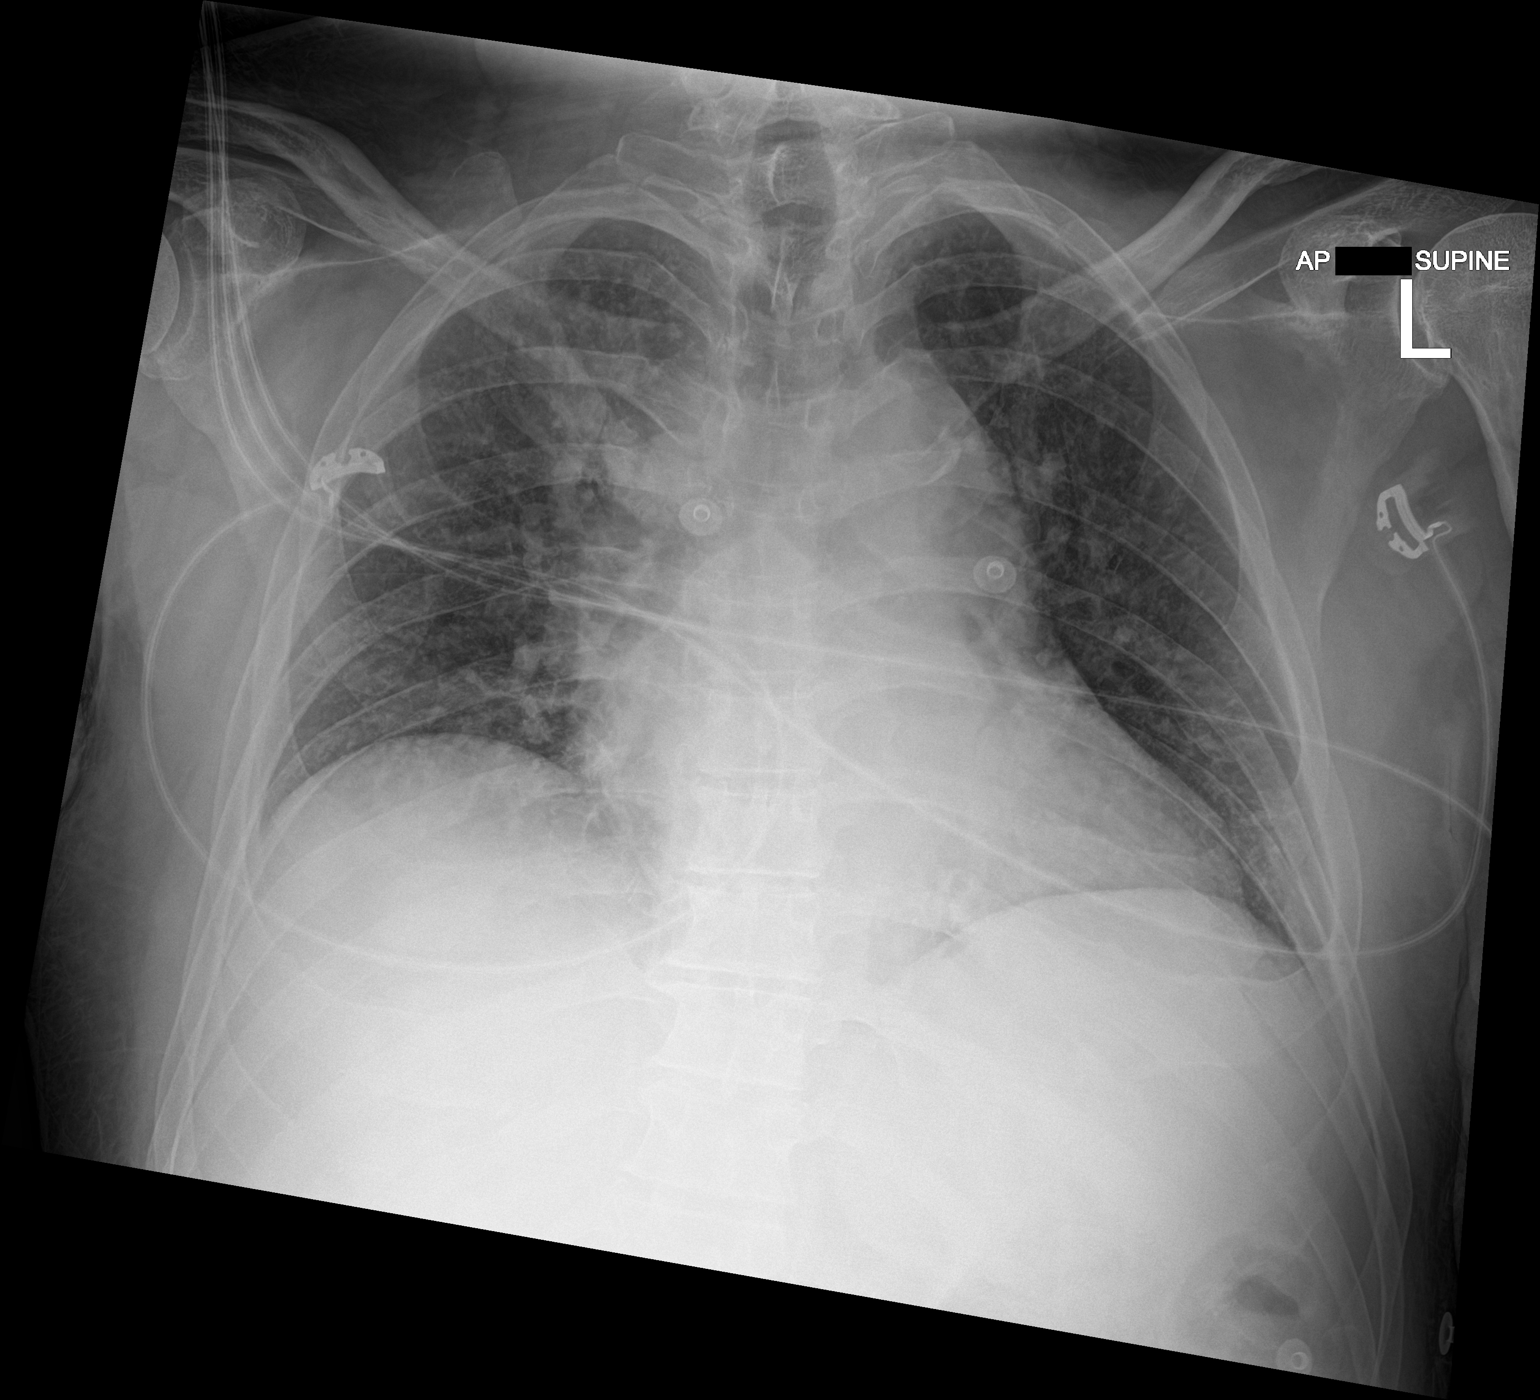

[1 of 1 positions shown; findings below may reference images not displayed]

FINDINGS: Stable cardiomediastinal contours. Lung volumes are decreased and
there is asymmetric elevation of the right hemidiaphragm. Diffuse
pulmonary vascular congestion. No pleural effusion or edema. No
airspace densities.
IMPRESSION: 1. Low lung volumes with asymmetric elevation of right
hemidiaphragm.
2. Pulmonary vascular congestion.

## 2021-12-25 IMAGING — XA IR PERCUTANEOUS ART THORMBECTOMY/INFUSION INTRACRANIAL INCLUDE D
11 of 12 series · 13 of 24 positions shown · IV contrast (IODINE)
Comparison: CT angiogram of the head and neck [DATE].

INDICATION: Left-sided hemiplegia right-sided gaze deficit. Occluded superior
division of the right middle cerebral artery on CT angiogram of the
head and neck.

EXAM:
1. EMERGENT LARGE VESSEL OCCLUSION THROMBOLYSIS (anterior
CIRCULATION)
TECHNIQUE: Following a full explanation of the procedure along with the
potential associated complications, an informed witnessed consent
was obtained. The risks of intracranial hemorrhage of 10%, worsening
neurological deficit, ventilator dependency, death and inability to
revascularize were all reviewed in detail with the patient's sister.

[Series 1: carotid · 11 acquisitions, 1 frame shown (1 of 10)]
[im 1/11]
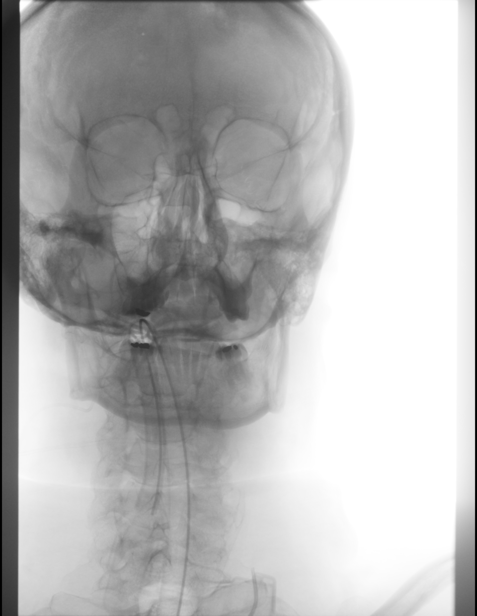

[Series 2: carotid · 10 acquisitions, 1 frame shown (2 of 10)]
[im 1/10]
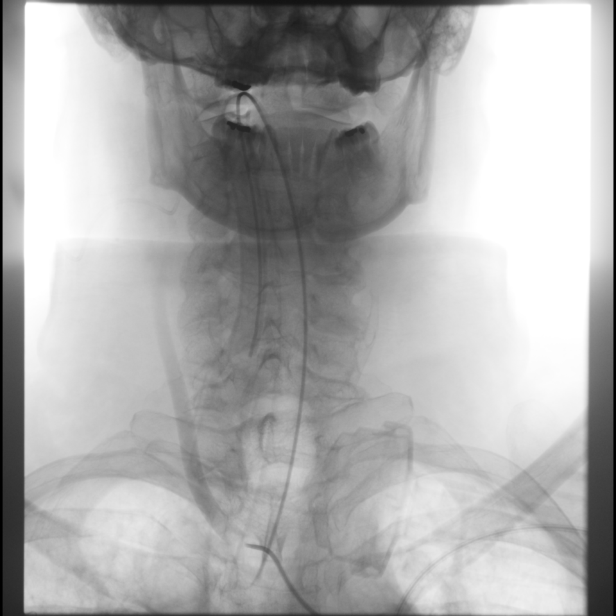

[Series 3: carotid · 10 acquisitions, 1 frame shown (3 of 10)]
[im 1/10]
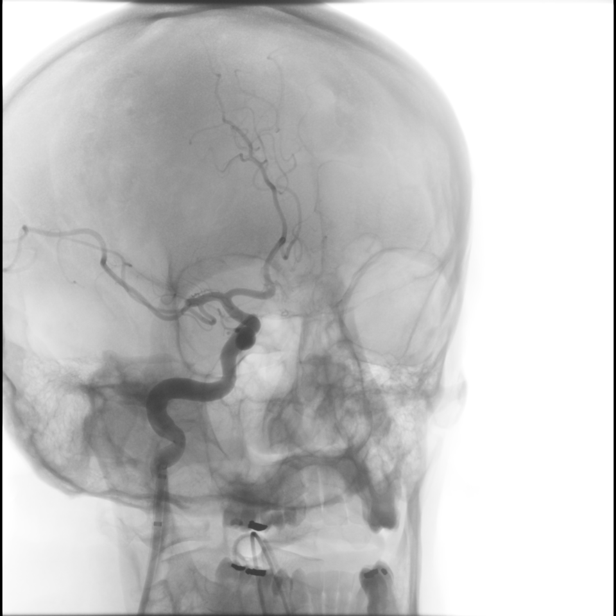

[Series 4: carotid · 17 acquisitions, 1 frame shown (4 of 10)]
[im 1/17]
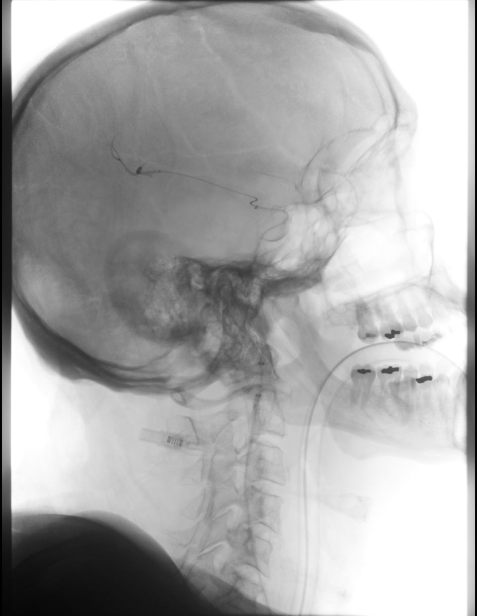

[Series 6: carotid · 19 acquisitions, 1 frame shown (5 of 10)]
[im 1/19]
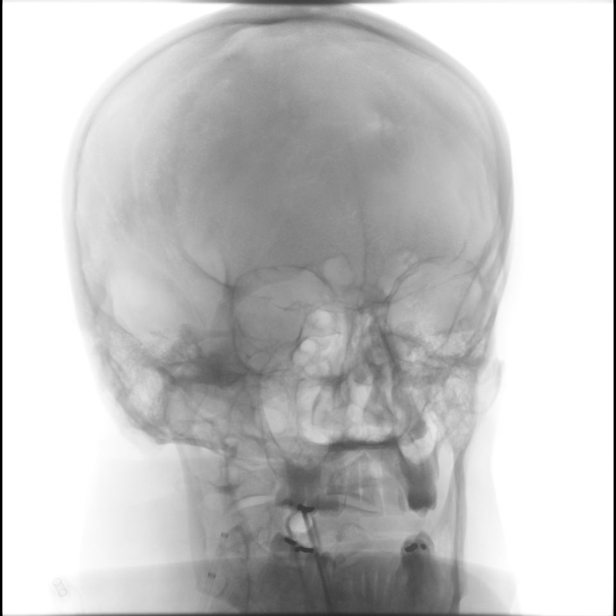

[Series 7: carotid · 11 acquisitions, 1 frame shown (6 of 10)]
[im 1/11]
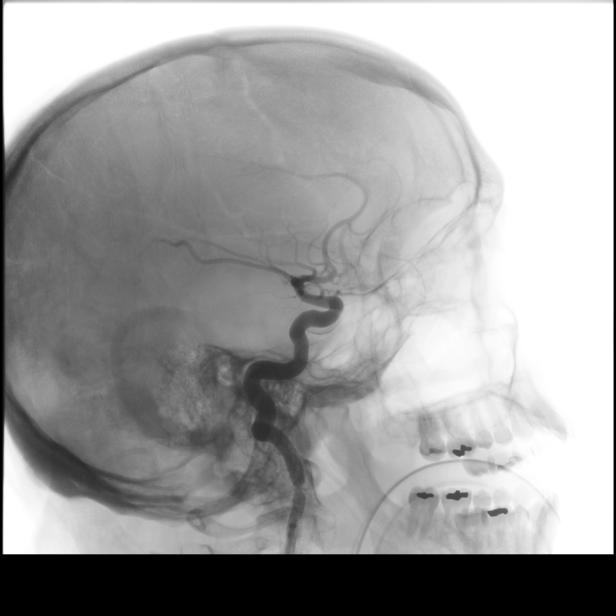

[Series 8: carotid · 11 acquisitions, 1 frame shown (7 of 10)]
[im 1/11]
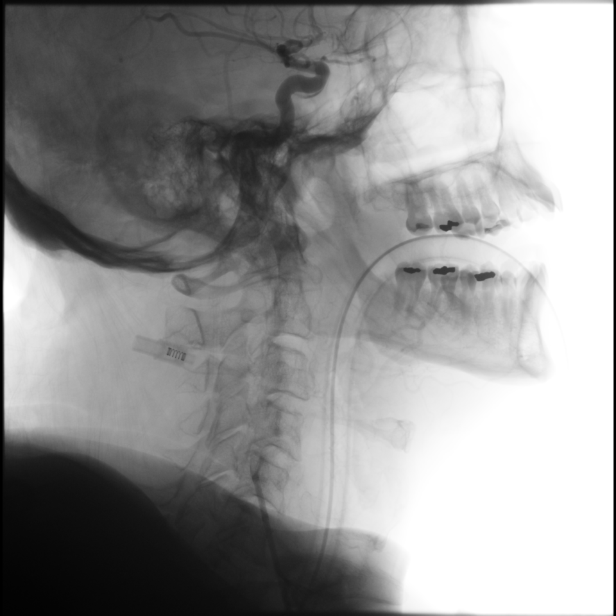

[Series 9: carotid · 23 acquisitions, 1 frame shown (8 of 10)]
[im 1/23]
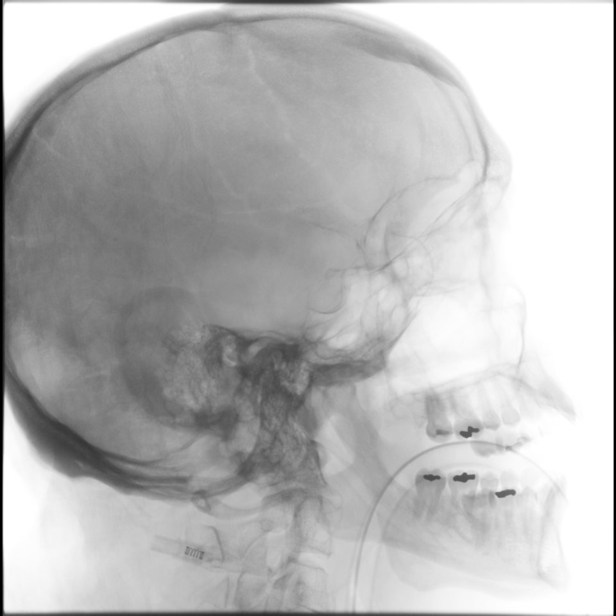

[Series 10: carotid · 15 acquisitions, 1 frame shown (9 of 10)]
[im 1/15]
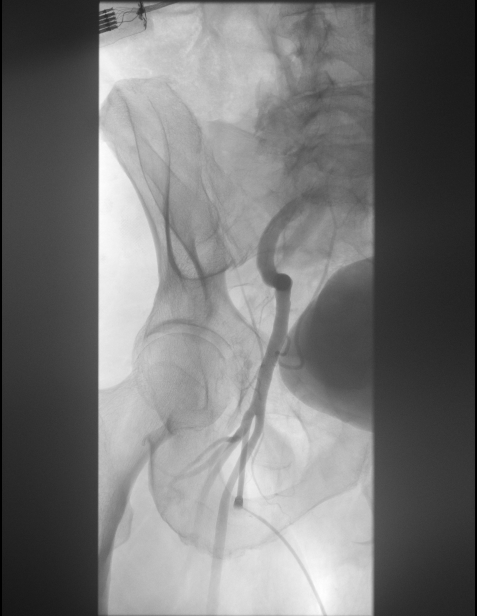

[Series 11: carotid · 1 of 18 frames shown (10 of 10)]
[frame 10/18]
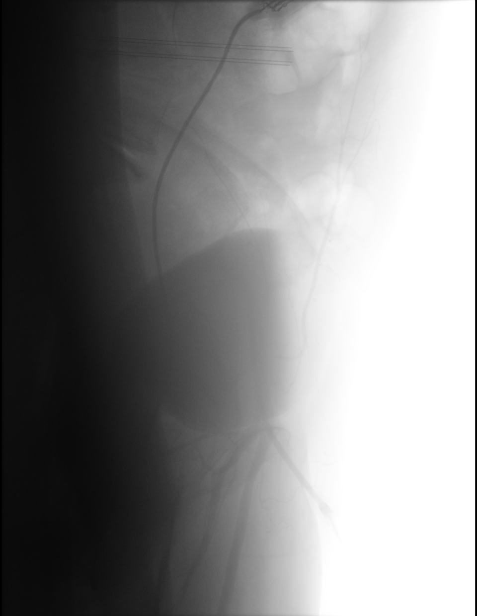

[Series 12: dct head clear nat fill full hu normal ( · axial · 0.5mm · 0.49mm/px · z∈[-187,-27]mm · 3 of 391 slices shown]
[im 66/391]
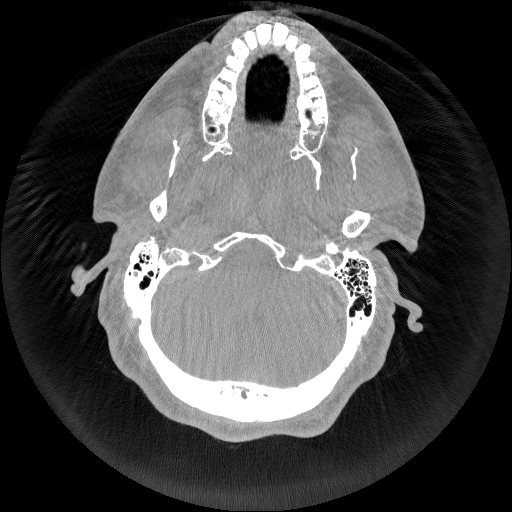
[im 228/391]
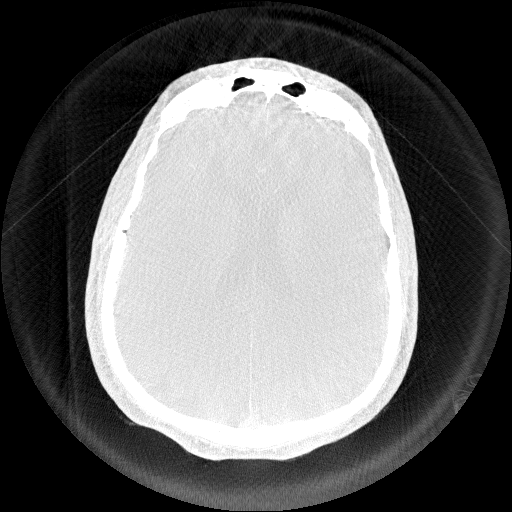
[im 391/391  full-range]
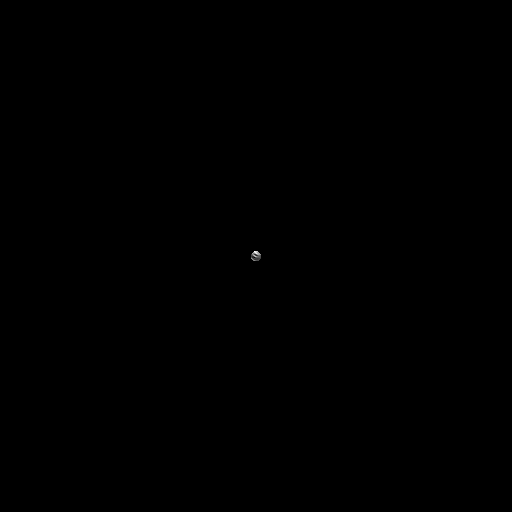

[13 of 24 positions shown; findings below may reference images not displayed]

MEDICATIONS:
Ancef 2 g IV antibiotic was administered within 1 hour of the
procedure.

ANESTHESIA/SEDATION:
General anesthesia.

CONTRAST:  Omnipaque 300 approximately 60 mL.

FLUOROSCOPY TIME:  Fluoroscopy Time: 16 minutes 48 seconds (468
mGy).

COMPLICATIONS:
None immediate.
The patient was then put under general anesthesia by the [REDACTED] at [HOSPITAL].

The right groin was prepped and draped in the usual sterile fashion.
Thereafter using modified Seldinger technique, transfemoral access
into the right common femoral artery was obtained without
difficulty. Over a 0.035 inch guidewire an 8 French 15 cm Pinnacle
sheath was inserted. Through this, and also over a 0.035 inch
guidewire a combination of an 087 95 cm balloon guide catheter
inside of which was a support 6 French 125 cm OFELIA catheter was
advanced to the right common carotid artery, and then to the distal
right internal carotid artery.
FINDINGS: Right common carotid arteriogram demonstrates the right external
carotid artery and its major branches to be widely patent.

The right internal carotid artery at the bulb to the cranial skull
base is widely patent. The petrous, the cavernous, and the
supraclinoid segments demonstrate wide patency.

The right anterior cerebral artery opacifies into the capillary and
venous phases.

The right middle cerebral artery demonstrates occlusion of the
codominant superior division. Inferior division in the anterior
temporal branches demonstrate wide patency into the capillary and
venous phases.

PROCEDURE:
The balloon guide catheter in the distal right internal carotid
artery, a combination of an 055 132 cm Zoom aspiration catheter with
an 021 150 cm microcatheter was advanced over a 0.014 inch standard
Synchro micro guidewire to the supraclinoid right ICA.

With the micro guidewire with a moderate J configuration, and a
torque device, access was obtained into the superior division M2 M3
region followed by the microcatheter.

The Zoom aspiration catheter was then advanced to the proximal right
middle cerebral artery.

Micro guidewire was removed. Good aspiration obtained from the hub
of the microcatheter. A gentle control arteriogram performed through
the microcatheter demonstrates safe positioning of tip of the
microcatheter. A 4 mm x 40 mm Solitaire X retrieval device was then
advanced to the distal end of the microcatheter and deployed in the
usual manner by slight forward gentle traction with the right hand
on the delivery micro guidewire with the left hand unsheathing the
distal and then the proximal portion of the retrieval device. The
device proximal portion was just proximal to the origin of the
superior division.

At this time the Zoom aspiration catheter was advanced into the
origin of the superior division.

With proximal flow arrest in the right internal carotid artery, and
constant aspiration with a 20 mL syringe at the hub of the balloon
guide catheter and with the aspiration device at the hub of the Zoom
aspiration catheter for approximately 2-1/2 minutes, the combination
of the retrieval device, the microcatheter and the Zoom aspiration
catheter was retrieved and removed. A control arteriogram performed
through the balloon guide catheter following reversal of flow arrest
demonstrated a complete revascularization of the superior division
with the right middle cerebral artery remaining. Branches remained
widely patent achieving a TICI 3 revascularization. A long segment
of clot was seen imbedded in the retrieval device sticky and
consistency mixed with blood.

The balloon guide catheter was retrieved and removed. A 5 French
diagnostic catheter was advanced to the left common carotid artery.
A control arteriogram performed through this demonstrated the left
external carotid artery and its major branches to be widely patent.

The left internal carotid artery at the bulb to the cranial skull
base demonstrates wide patency. The left middle and the left
anterior cerebral artery is opacified into the capillary and venous
phases.

The 8 French sheath was removed with hemostasis achieved with an 8
French Angio-Seal closure device.

The patient was then extubated and was noted to be moving his right
side spontaneously. No significant motion was noted in the left arm
and leg. He was then transferred to the PACU and then neuro ICU for
post revascularization care.

An immediate CT of the brain demonstrated no evidence of gross
intracranial hemorrhage or mass effect.
IMPRESSION: Status post endovascular complete revascularization of the superior
division of the right middle cerebral artery with 1 pass with a 4 mm
x 40 mm Solitaire X retrieval device, and contact aspiration
achieving a TICI 3 revascularization.

PLAN:
Follow-up in the clinic in 2 weeks post discharge.

## 2021-12-25 IMAGING — DX DG ABDOMEN 1V
1 series · 1 of 1 positions shown · non-contrast
Comparison: One view chest x-ray [DATE] p.m.

CLINICAL DATA: OG tube placement.

EXAM:
ABDOMEN - 1 VIEW

[abdomen]
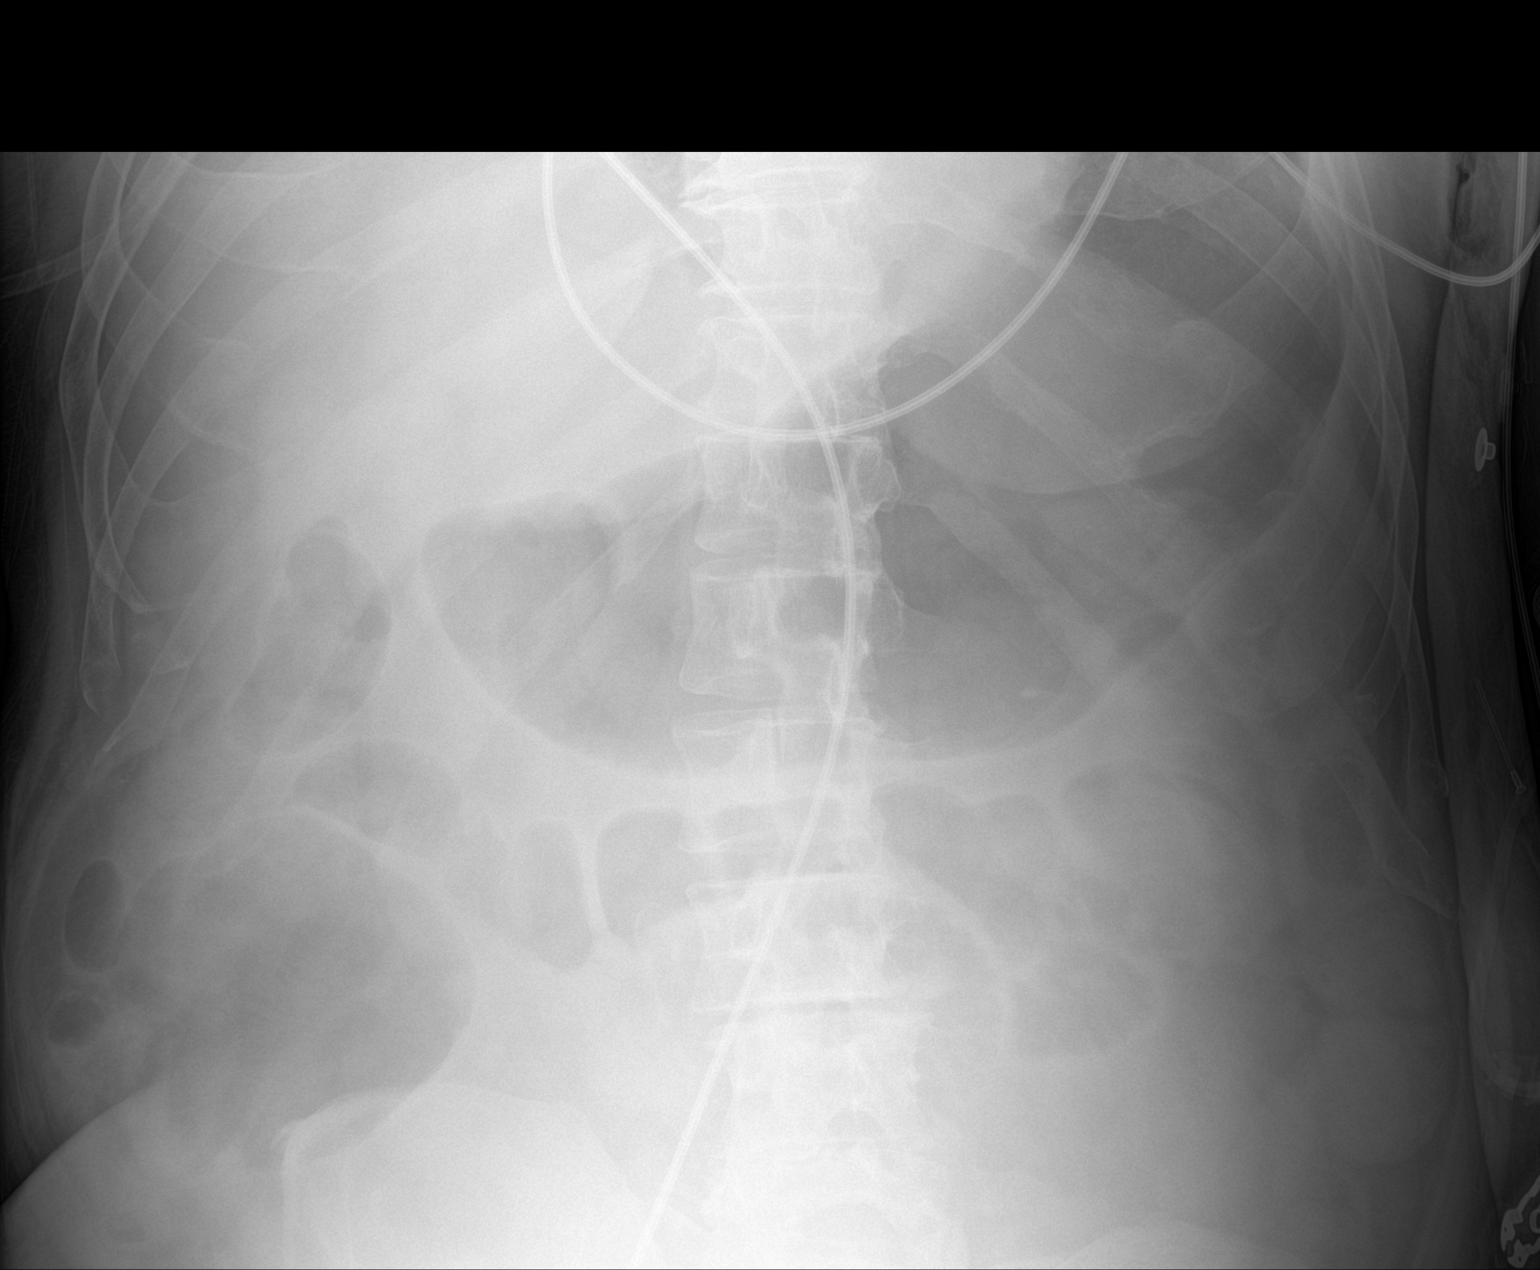

[1 of 1 positions shown; findings below may reference images not displayed]

FINDINGS: Gaseous distention of the stomach noted. Bowel gas pattern otherwise
unremarkable. OG tube is not visualized over the distal esophagus or
stomach.
IMPRESSION: OG tube is not visualized over the distal esophagus or stomach.
Gaseous distention of the stomach.

## 2021-12-25 IMAGING — DX DG CHEST 1V PORT
1 series · 1 of 1 positions shown · non-contrast
Comparison: One-view chest x-ray [DATE]

CLINICAL DATA: Intubation and OG tube placement.

EXAM:
PORTABLE CHEST 1 VIEW

[chest]
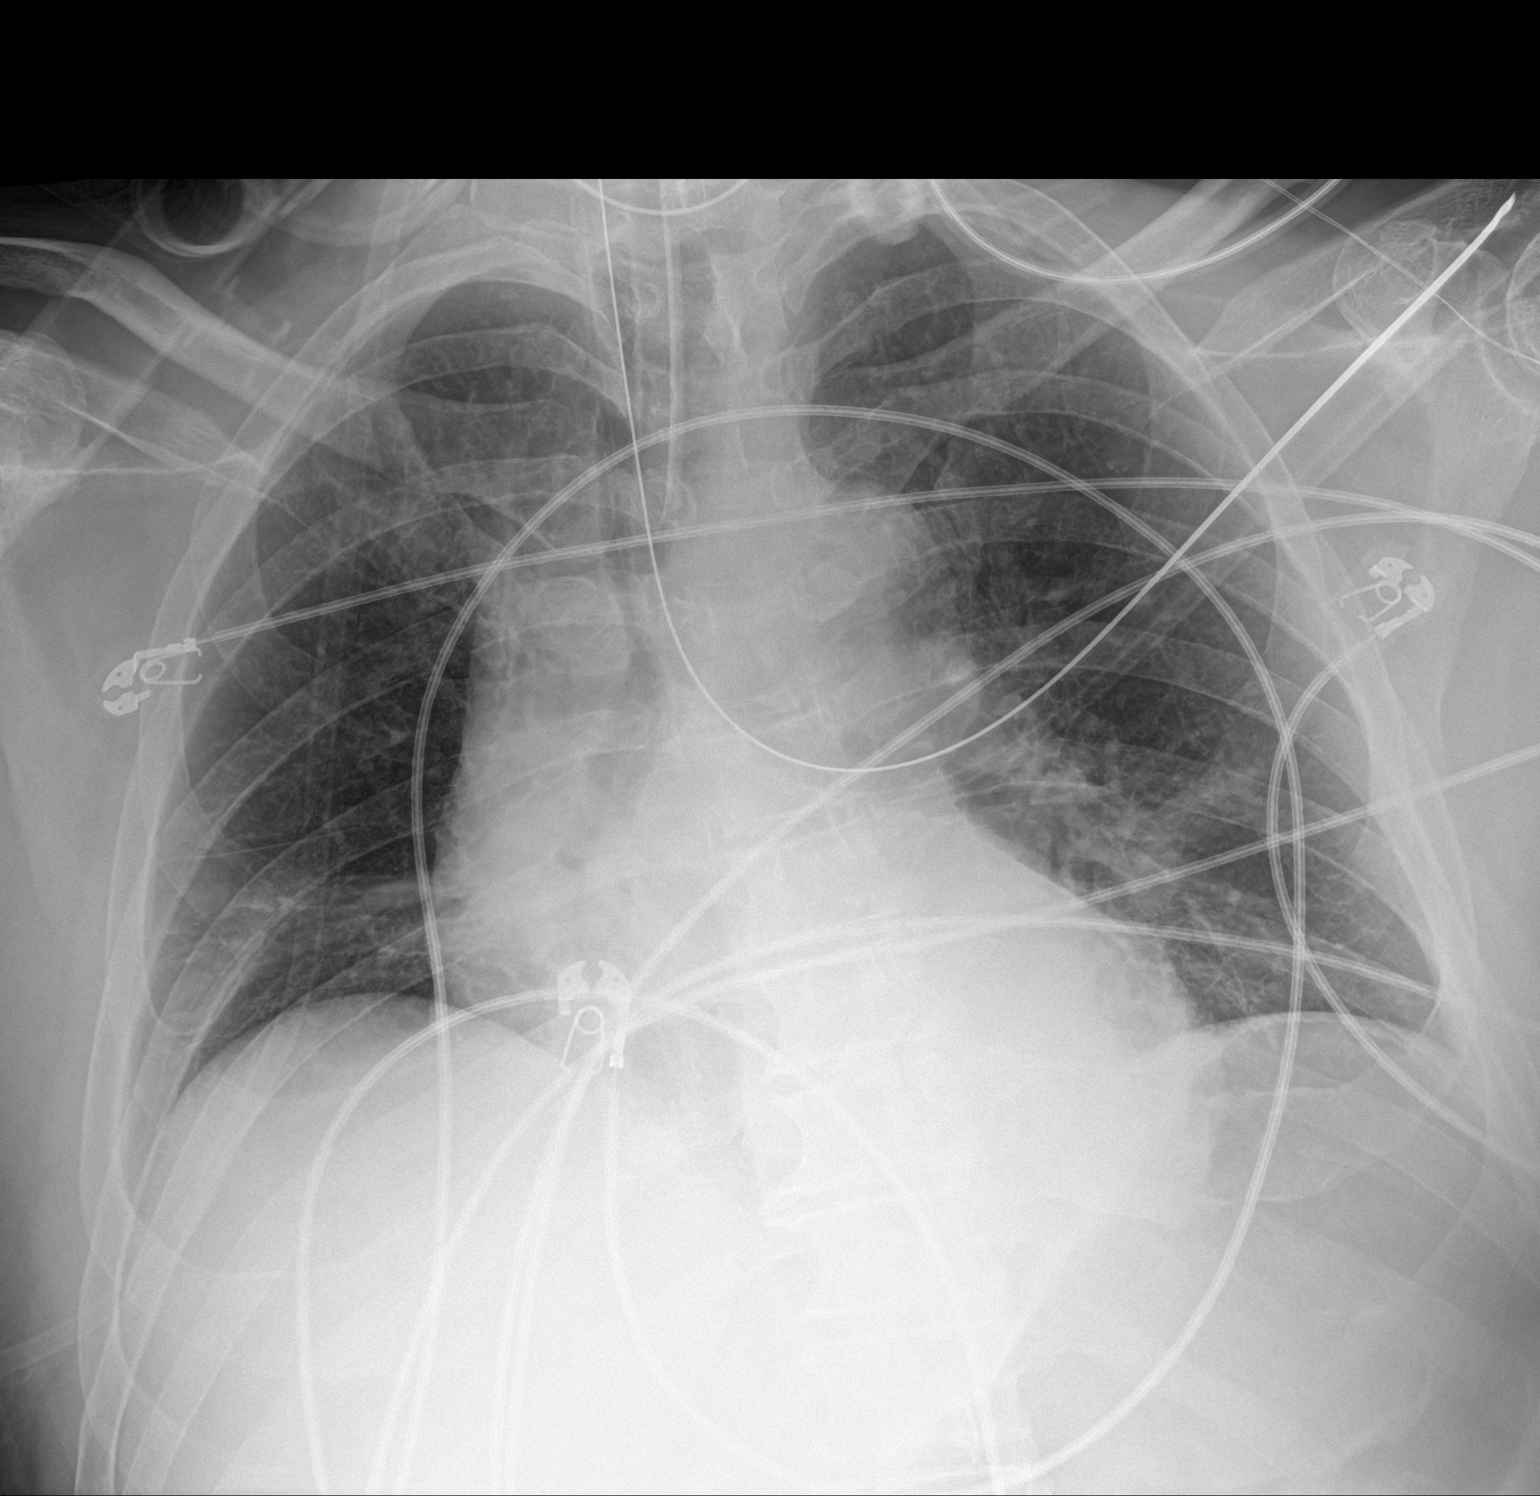

[1 of 1 positions shown; findings below may reference images not displayed]

FINDINGS: Heart is enlarged. Pulmonary vascular congestion noted peer airspace
opacities noted over the lower lobes bilaterally.

Patient has been intubated. Endotracheal tube terminates 4 cm above
the carina. OG tube is coiled in the cervical esophagus.
IMPRESSION: 1. Cardiomegaly and pulmonary vascular congestion concerning for
congestive heart failure.
2. Bibasilar airspace disease likely reflects atelectasis. Infection
is not excluded.
3. Satisfactory positioning of endotracheal tube.
4. OG tube is coiled in the cervical esophagus.

These results will be called to the ordering clinician or
representative by the Radiologist Assistant, and communication
documented in the PACS or [REDACTED].

## 2021-12-25 IMAGING — DX DG CHEST 1V PORT
1 series · 1 of 1 positions shown · non-contrast
Comparison: Earlier today.

CLINICAL DATA: Endotracheal tube placement, central line placement.

EXAM:
PORTABLE CHEST 1 VIEW

[chest ap]
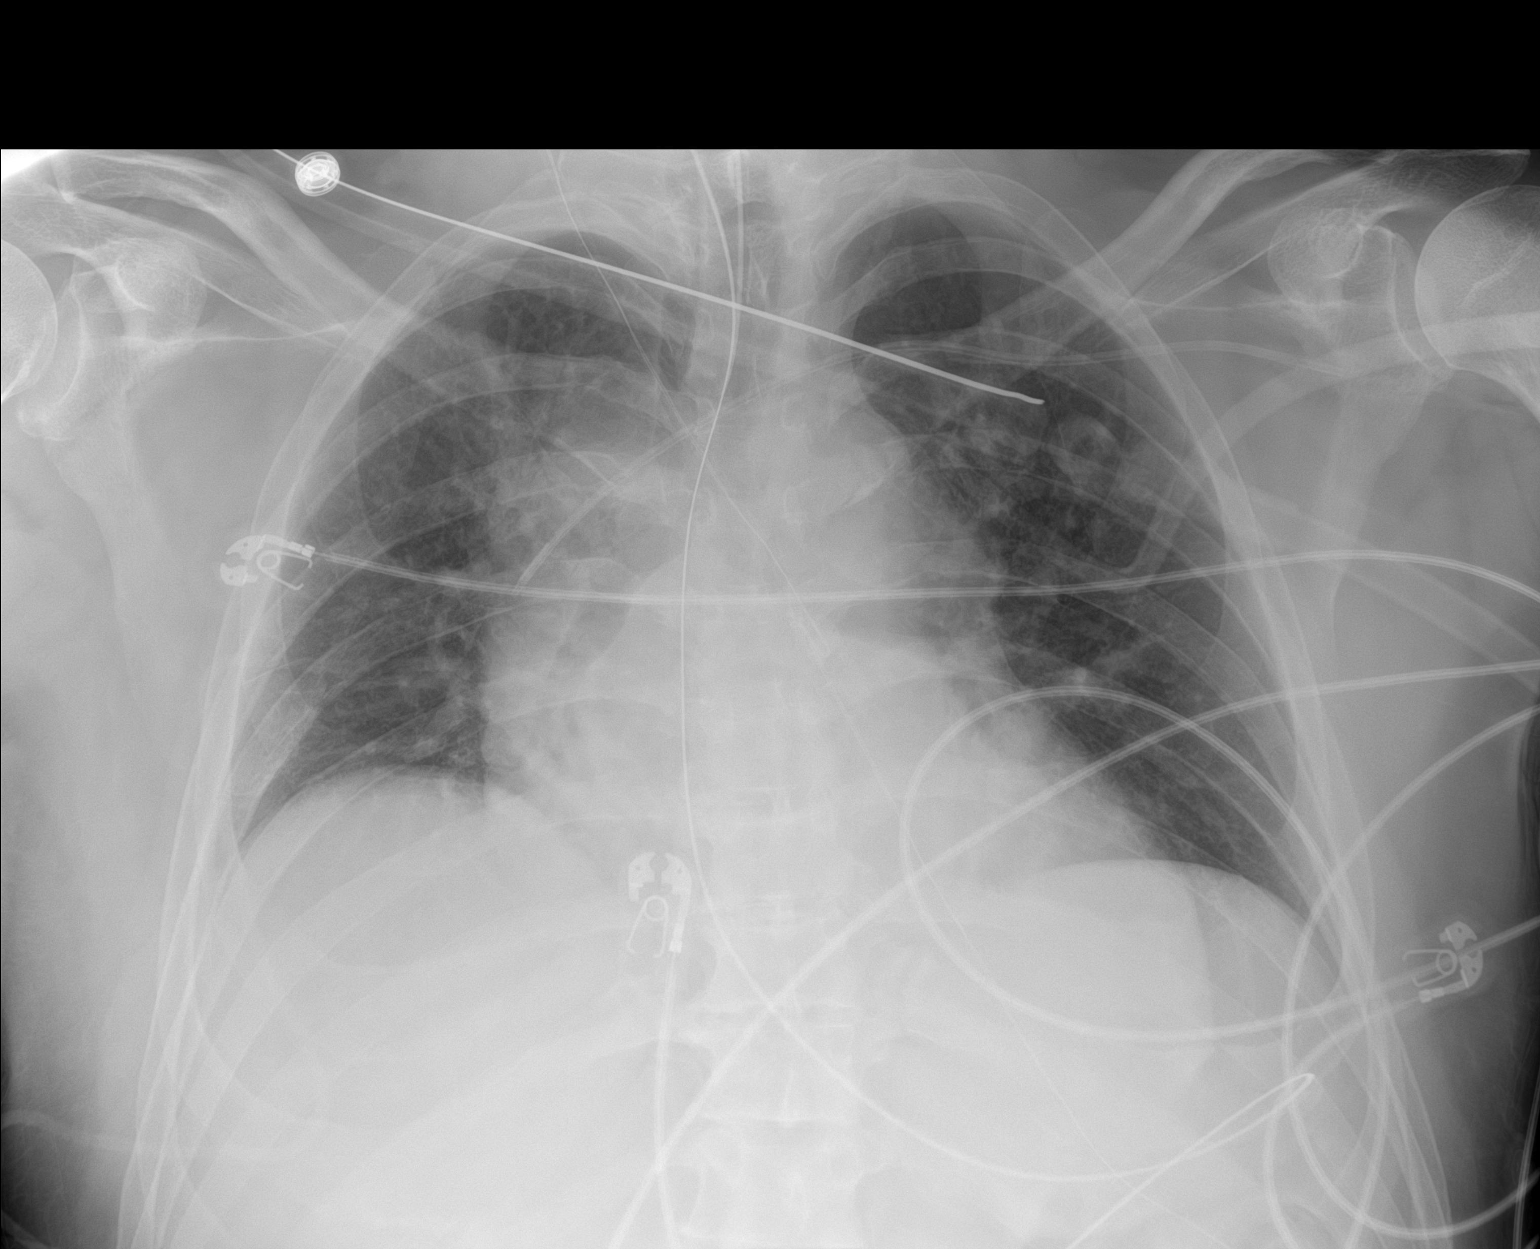

[1 of 1 positions shown; findings below may reference images not displayed]

FINDINGS: Endotracheal tube tip 4.3 cm from the carina at the level of the
clavicular heads. New left subclavian central venous catheter tip
overlies the upper SVC. No pneumothorax. Enteric tube in place with
tip and side-port below the diaphragm not included in the field of
view. Persistent low lung volumes. There are patchy right supra and
infrahilar opacities. No large pleural effusion.
IMPRESSION: 1. Left subclavian central venous line tip projects over the upper
SVC. No pneumothorax.
2. Stable positioning of endotracheal and enteric tubes.
3. Persistent low lung volumes with patchy right supra and
infrahilar opacities.

## 2021-12-25 SURGERY — IR WITH ANESTHESIA
Anesthesia: General

## 2021-12-25 MED ORDER — MIDAZOLAM HCL 2 MG/2ML IJ SOLN
INTRAMUSCULAR | Status: AC
  Filled 2021-12-25: qty 2

## 2021-12-25 MED ORDER — PANTOPRAZOLE SODIUM 40 MG IV SOLR
40.0000 mg | Freq: Two times a day (BID) | INTRAVENOUS | Status: DC
  Administered 2021-12-25 – 2022-01-03 (×19): 40 mg via INTRAVENOUS
  Filled 2021-12-25 (×19): qty 10

## 2021-12-25 MED ORDER — ROCURONIUM BROMIDE 10 MG/ML (PF) SYRINGE
PREFILLED_SYRINGE | INTRAVENOUS | Status: AC
  Administered 2021-12-25: 100 mg
  Filled 2021-12-25: qty 10

## 2021-12-25 MED ORDER — SODIUM CHLORIDE 0.9 % IV SOLN
500.0000 mg | INTRAVENOUS | Status: DC
  Administered 2021-12-25: 500 mg via INTRAVENOUS
  Filled 2021-12-25: qty 5

## 2021-12-25 MED ORDER — ACETAMINOPHEN 325 MG PO TABS: 650.0000 mg | ORAL_TABLET | ORAL | Status: DC | PRN

## 2021-12-25 MED ORDER — SODIUM CHLORIDE 0.9% FLUSH: 3.0000 mL | Freq: Once | INTRAVENOUS | Status: DC

## 2021-12-25 MED ORDER — ACETAMINOPHEN 650 MG RE SUPP
650.0000 mg | Freq: Once | RECTAL | Status: AC
  Administered 2021-12-25: 650 mg via RECTAL

## 2021-12-25 MED ORDER — SODIUM CHLORIDE 0.9% IV SOLUTION: Freq: Once | INTRAVENOUS | Status: DC

## 2021-12-25 MED ORDER — ETOMIDATE 2 MG/ML IV SOLN
INTRAVENOUS | Status: AC
Start: 2021-12-25 — End: 2021-12-25
  Administered 2021-12-25: 20 mg
  Filled 2021-12-25: qty 20

## 2021-12-25 MED ORDER — NITROGLYCERIN 1 MG/10 ML FOR IR/CATH LAB
INTRA_ARTERIAL | Status: AC
  Filled 2021-12-25: qty 10

## 2021-12-25 MED ORDER — VASOPRESSIN 20 UNITS/100 ML INFUSION FOR SHOCK
0.0000 [IU]/min | INTRAVENOUS | Status: DC
  Administered 2021-12-25 – 2021-12-26 (×2): 0.03 [IU]/min via INTRAVENOUS
  Filled 2021-12-25 (×2): qty 100

## 2021-12-25 MED ORDER — IOHEXOL 350 MG/ML SOLN
100.0000 mL | Freq: Once | INTRAVENOUS | Status: AC | PRN
  Administered 2021-12-25: 100 mL via INTRAVENOUS

## 2021-12-25 MED ORDER — POLYETHYLENE GLYCOL 3350 17 G PO PACK
17.0000 g | PACK | Freq: Every day | ORAL | Status: DC
  Filled 2021-12-25: qty 1

## 2021-12-25 MED ORDER — ACETAMINOPHEN 325 MG PO TABS
650.0000 mg | ORAL_TABLET | ORAL | Status: DC | PRN
  Administered 2022-01-15: 650 mg via ORAL
  Filled 2021-12-25: qty 2

## 2021-12-25 MED ORDER — VANCOMYCIN HCL IN DEXTROSE 1-5 GM/200ML-% IV SOLN
1000.0000 mg | Freq: Two times a day (BID) | INTRAVENOUS | Status: DC
  Administered 2021-12-26 – 2021-12-27 (×2): 1000 mg via INTRAVENOUS
  Filled 2021-12-25 (×2): qty 200

## 2021-12-25 MED ORDER — LACTATED RINGERS IV BOLUS
1000.0000 mL | Freq: Once | INTRAVENOUS | Status: AC
Start: 2021-12-25 — End: 2021-12-25
  Administered 2021-12-25: 1000 mL via INTRAVENOUS

## 2021-12-25 MED ORDER — ORAL CARE MOUTH RINSE
15.0000 mL | OROMUCOSAL | Status: DC
  Administered 2021-12-25 – 2022-01-15 (×200): 15 mL via OROMUCOSAL

## 2021-12-25 MED ORDER — DOCUSATE SODIUM 50 MG/5ML PO LIQD
100.0000 mg | Freq: Two times a day (BID) | ORAL | Status: DC
  Administered 2021-12-26 – 2022-01-04 (×7): 100 mg
  Filled 2021-12-25 (×10): qty 10

## 2021-12-25 MED ORDER — ENOXAPARIN SODIUM 40 MG/0.4ML IJ SOSY: 40.0000 mg | PREFILLED_SYRINGE | INTRAMUSCULAR | Status: DC

## 2021-12-25 MED ORDER — ACETAMINOPHEN 160 MG/5ML PO SOLN: 650.0000 mg | ORAL | Status: DC | PRN

## 2021-12-25 MED ORDER — INSULIN ASPART 100 UNIT/ML IJ SOLN
5.0000 [IU] | Freq: Once | INTRAMUSCULAR | Status: AC
  Administered 2021-12-25: 5 [IU] via INTRAVENOUS

## 2021-12-25 MED ORDER — SENNOSIDES-DOCUSATE SODIUM 8.6-50 MG PO TABS: 1.0000 | ORAL_TABLET | Freq: Every evening | ORAL | Status: DC | PRN

## 2021-12-25 MED ORDER — SODIUM CHLORIDE 0.9 % IV SOLN
2.0000 g | INTRAVENOUS | Status: DC
  Administered 2021-12-25: 2 g via INTRAVENOUS
  Filled 2021-12-25: qty 20

## 2021-12-25 MED ORDER — PHENYLEPHRINE HCL-NACL 20-0.9 MG/250ML-% IV SOLN
INTRAVENOUS | Status: DC | PRN
Start: 2021-12-25 — End: 2021-12-25
  Administered 2021-12-25: 50 ug/min via INTRAVENOUS

## 2021-12-25 MED ORDER — CEFAZOLIN SODIUM-DEXTROSE 2-4 GM/100ML-% IV SOLN
INTRAVENOUS | Status: AC
  Filled 2021-12-25: qty 100

## 2021-12-25 MED ORDER — SODIUM CHLORIDE (PF) 0.9 % IJ SOLN
5.0000 [IU] | Freq: Once | INTRAMUSCULAR | Status: DC
  Filled 2021-12-25: qty 0.05

## 2021-12-25 MED ORDER — EPHEDRINE SULFATE-NACL 50-0.9 MG/10ML-% IV SOSY
PREFILLED_SYRINGE | INTRAVENOUS | Status: DC | PRN
Start: 2021-12-25 — End: 2021-12-25
  Administered 2021-12-25: 10 mg via INTRAVENOUS

## 2021-12-25 MED ORDER — FENTANYL CITRATE PF 50 MCG/ML IJ SOSY
50.0000 ug | PREFILLED_SYRINGE | INTRAMUSCULAR | Status: AC | PRN
  Administered 2021-12-28 (×3): 50 ug via INTRAVENOUS
  Filled 2021-12-25 (×3): qty 1

## 2021-12-25 MED ORDER — SUCCINYLCHOLINE CHLORIDE 200 MG/10ML IV SOSY
PREFILLED_SYRINGE | INTRAVENOUS | Status: DC | PRN
Start: 2021-12-25 — End: 2021-12-25
  Administered 2021-12-25: 160 mg via INTRAVENOUS

## 2021-12-25 MED ORDER — CHLORHEXIDINE GLUCONATE CLOTH 2 % EX PADS
6.0000 | MEDICATED_PAD | Freq: Every day | CUTANEOUS | Status: DC
  Administered 2021-12-25 – 2022-01-15 (×23): 6 via TOPICAL

## 2021-12-25 MED ORDER — PIPERACILLIN-TAZOBACTAM 3.375 G IVPB 30 MIN: 3.3750 g | Freq: Once | INTRAVENOUS | Status: DC

## 2021-12-25 MED ORDER — ROCURONIUM BROMIDE 10 MG/ML (PF) SYRINGE
PREFILLED_SYRINGE | INTRAVENOUS | Status: DC | PRN
  Administered 2021-12-25: 50 mg via INTRAVENOUS

## 2021-12-25 MED ORDER — CHLORHEXIDINE GLUCONATE 0.12% ORAL RINSE (MEDLINE KIT)
15.0000 mL | Freq: Two times a day (BID) | OROMUCOSAL | Status: DC
  Administered 2021-12-25 – 2022-01-15 (×42): 15 mL via OROMUCOSAL

## 2021-12-25 MED ORDER — NITROGLYCERIN 5 MG/ML IV SOLN
INTRAVENOUS | Status: DC | PRN
  Administered 2021-12-25 (×2): 25 ug via INTRA_ARTERIAL

## 2021-12-25 MED ORDER — SODIUM CHLORIDE 0.9 % IV SOLN: INTRAVENOUS | Status: DC | PRN

## 2021-12-25 MED ORDER — SODIUM CHLORIDE 0.9 % IV SOLN: INTRAVENOUS | Status: DC

## 2021-12-25 MED ORDER — ACETAMINOPHEN 650 MG RE SUPP
RECTAL | Status: AC
  Filled 2021-12-25: qty 1

## 2021-12-25 MED ORDER — NOREPINEPHRINE 4 MG/250ML-% IV SOLN
0.0000 ug/min | INTRAVENOUS | Status: DC
  Administered 2021-12-25: 34 ug/min via INTRAVENOUS
  Administered 2021-12-26: 22 ug/min via INTRAVENOUS
  Administered 2021-12-26: 7 ug/min via INTRAVENOUS
  Administered 2021-12-26: 22 ug/min via INTRAVENOUS
  Administered 2021-12-26: 18 ug/min via INTRAVENOUS
  Filled 2021-12-25 (×5): qty 250

## 2021-12-25 MED ORDER — FENTANYL CITRATE PF 50 MCG/ML IJ SOSY
50.0000 ug | PREFILLED_SYRINGE | INTRAMUSCULAR | Status: DC | PRN
  Administered 2021-12-25: 100 ug via INTRAVENOUS
  Administered 2021-12-26 – 2021-12-27 (×2): 50 ug via INTRAVENOUS
  Administered 2021-12-27 – 2021-12-30 (×6): 100 ug via INTRAVENOUS
  Administered 2021-12-30: 50 ug via INTRAVENOUS
  Administered 2021-12-31 (×2): 100 ug via INTRAVENOUS
  Administered 2021-12-31 – 2022-01-01 (×2): 50 ug via INTRAVENOUS
  Administered 2022-01-01 (×2): 100 ug via INTRAVENOUS
  Administered 2022-01-01 – 2022-01-02 (×2): 50 ug via INTRAVENOUS
  Administered 2022-01-03: 100 ug via INTRAVENOUS
  Administered 2022-01-03: 50 ug via INTRAVENOUS
  Administered 2022-01-03 (×4): 100 ug via INTRAVENOUS
  Filled 2021-12-25: qty 1
  Filled 2021-12-25 (×4): qty 2
  Filled 2021-12-25: qty 1
  Filled 2021-12-25: qty 2
  Filled 2021-12-25 (×2): qty 1
  Filled 2021-12-25 (×3): qty 2
  Filled 2021-12-25: qty 1
  Filled 2021-12-25 (×4): qty 2
  Filled 2021-12-25: qty 1
  Filled 2021-12-25: qty 4
  Filled 2021-12-25 (×3): qty 2
  Filled 2021-12-25 (×2): qty 1
  Filled 2021-12-25: qty 2

## 2021-12-25 MED ORDER — DEXTROSE 50 % IV SOLN
25.0000 g | Freq: Once | INTRAVENOUS | Status: AC
  Administered 2021-12-25: 25 g via INTRAVENOUS
  Filled 2021-12-25: qty 50

## 2021-12-25 MED ORDER — SODIUM BICARBONATE 8.4 % IV SOLN
INTRAVENOUS | Status: AC
  Filled 2021-12-25: qty 50

## 2021-12-25 MED ORDER — VANCOMYCIN HCL 2000 MG/400ML IV SOLN
2000.0000 mg | Freq: Once | INTRAVENOUS | Status: AC
  Administered 2021-12-25: 2000 mg via INTRAVENOUS
  Filled 2021-12-25 (×2): qty 400

## 2021-12-25 MED ORDER — IOHEXOL 300 MG/ML  SOLN
100.0000 mL | Freq: Once | INTRAMUSCULAR | Status: AC | PRN
  Administered 2021-12-25: 68 mL via INTRA_ARTERIAL

## 2021-12-25 MED ORDER — LIDOCAINE 2% (20 MG/ML) 5 ML SYRINGE
INTRAMUSCULAR | Status: DC | PRN
  Administered 2021-12-25: 100 mg via INTRAVENOUS

## 2021-12-25 MED ORDER — ACETAMINOPHEN 160 MG/5ML PO SOLN
650.0000 mg | ORAL | Status: DC | PRN
Start: 2021-12-25 — End: 2022-01-15
  Administered 2021-12-26 – 2022-01-14 (×27): 650 mg
  Filled 2021-12-25 (×28): qty 20.3

## 2021-12-25 MED ORDER — FENTANYL CITRATE PF 50 MCG/ML IJ SOSY
PREFILLED_SYRINGE | INTRAMUSCULAR | Status: AC
  Filled 2021-12-25: qty 2

## 2021-12-25 MED ORDER — ALBUMIN HUMAN 5 % IV SOLN: INTRAVENOUS | Status: DC | PRN

## 2021-12-25 MED ORDER — LACTATED RINGERS IV SOLN: INTRAVENOUS | Status: DC | PRN

## 2021-12-25 MED ORDER — LACTATED RINGERS IV BOLUS
250.0000 mL | Freq: Once | INTRAVENOUS | Status: AC
  Administered 2021-12-25: 250 mL via INTRAVENOUS

## 2021-12-25 MED ORDER — PROPOFOL 10 MG/ML IV BOLUS
INTRAVENOUS | Status: DC | PRN
  Administered 2021-12-25: 100 mg via INTRAVENOUS

## 2021-12-25 MED ORDER — ACETAMINOPHEN 650 MG RE SUPP: 650.0000 mg | RECTAL | Status: DC | PRN

## 2021-12-25 MED ORDER — CEFAZOLIN SODIUM-DEXTROSE 2-3 GM-%(50ML) IV SOLR
INTRAVENOUS | Status: DC | PRN
  Administered 2021-12-25: 2 g via INTRAVENOUS

## 2021-12-25 MED ORDER — CLEVIDIPINE BUTYRATE 0.5 MG/ML IV EMUL: 0.0000 mg/h | INTRAVENOUS | Status: DC

## 2021-12-25 MED ORDER — SODIUM BICARBONATE 8.4 % IV SOLN
50.0000 meq | Freq: Once | INTRAVENOUS | Status: AC
  Administered 2021-12-25: 50 meq via INTRAVENOUS

## 2021-12-25 MED ORDER — DEXMEDETOMIDINE HCL IN NACL 400 MCG/100ML IV SOLN
0.0000 ug/kg/h | INTRAVENOUS | Status: AC
  Administered 2021-12-25: 0.4 ug/kg/h via INTRAVENOUS
  Administered 2021-12-25 – 2021-12-26 (×2): 0.8 ug/kg/h via INTRAVENOUS
  Administered 2021-12-26 (×2): 0.4 ug/kg/h via INTRAVENOUS
  Administered 2021-12-26 – 2021-12-27 (×2): 0.8 ug/kg/h via INTRAVENOUS
  Administered 2021-12-27: 0.6 ug/kg/h via INTRAVENOUS
  Administered 2021-12-27: 0.4 ug/kg/h via INTRAVENOUS
  Administered 2021-12-27: 0.8 ug/kg/h via INTRAVENOUS
  Administered 2021-12-28: 0.6 ug/kg/h via INTRAVENOUS
  Administered 2021-12-28 (×2): 0.8 ug/kg/h via INTRAVENOUS
  Administered 2021-12-28: 1 ug/kg/h via INTRAVENOUS
  Filled 2021-12-25 (×14): qty 100

## 2021-12-25 MED ORDER — STROKE: EARLY STAGES OF RECOVERY BOOK
Freq: Once | Status: AC
  Filled 2021-12-25 (×2): qty 1

## 2021-12-25 MED ORDER — ACETAMINOPHEN 650 MG RE SUPP
650.0000 mg | RECTAL | Status: DC | PRN
Start: 2021-12-25 — End: 2022-01-15

## 2021-12-25 MED ORDER — SODIUM CHLORIDE 0.9% IV SOLUTION: Freq: Once | INTRAVENOUS | Status: AC

## 2021-12-25 MED ORDER — SUGAMMADEX SODIUM 200 MG/2ML IV SOLN
INTRAVENOUS | Status: DC | PRN
  Administered 2021-12-25: 200 mg via INTRAVENOUS

## 2021-12-25 MED ORDER — PHENYLEPHRINE 40 MCG/ML (10ML) SYRINGE FOR IV PUSH (FOR BLOOD PRESSURE SUPPORT)
PREFILLED_SYRINGE | INTRAVENOUS | Status: DC | PRN
  Administered 2021-12-25: 80 ug via INTRAVENOUS
  Administered 2021-12-25: 200 ug via INTRAVENOUS

## 2021-12-25 MED ORDER — NOREPINEPHRINE 4 MG/250ML-% IV SOLN
0.0000 ug/min | INTRAVENOUS | Status: DC
  Administered 2021-12-25: 2 ug/min via INTRAVENOUS
  Filled 2021-12-25: qty 250

## 2021-12-25 NOTE — Progress Notes (Signed)
Pharmacy Antibiotic Note ? ?Vincent Ferrell is a 61 y.o. 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  Pharmacy has been consulted for vancomycin and Zosyn dosing. ? ?WBC 20.6, febrile at 101.19F on presentation. Will give vancomycin 2000 mg IV x1 (~20mg /kg). ? ?Plan: ?Vancomycin 2000 mg IV x1 ?Zosyn 3.375 mg IV x1 ?F/u clinical course, fever curve, renal function ? ?Weight: 100.8 kg (222 lb 3.6 oz) ? ?No data recorded. ? ?Recent Labs  ?Lab 12/25/21 ?1032 12/25/21 ?1048  ?WBC  --  20.0*  ?CREATININE 1.90*  --   ?  ?CrCl cannot be calculated (Unknown ideal weight.).   ? ?Not on File ? ?Antimicrobials this admission: ?Zosyn 4/14 >>  ?Vancomycin 4/14 >>  ? ?Dose adjustments this admission: ?N/A ? ?Microbiology results: ?None ? ?Thank you for allowing pharmacy to be a part of this patient?s care. ? ?Lissa Merlin, PharmD ?PGY1 Acute Care Pharmacy Resident  ?Phone: [phone] ?12/25/2021 11:41 AM ? ?Please check [email or web address] for unit-specific pharmacy phone numbers. ? ? ?

## 2021-12-25 NOTE — Consult Note (Signed)
? ?Referring Provider:  Dr. Prudencio Burly          ?Primary Care Physician:  Houston Siren., MD ?Primary Gastroenterologist:     Althia Forts        ?Reason for Consultation:  Hematochezia               ? ? ?ASSESSMENT /  PLAN   ? ?Hematochezia ?Hypovolemic/septic shock ?Patient developed significant maroon colored hematochezia this evening.  Hemoglobin has steadily downtrended throughout today from 11.9 upon arrival to 9.2 to 8.8 to 7.0.  At this time would be more suspicious for a lower GI source since patient has yellow-colored OG tube output. No blood thinner use.  Last platelets and coags were normal, though patient has become progressively more hypotensive, requiring vasopressor therapy, which could put him at increased risk for going into DIC.  At this time sources of his GI bleed could include diverticular bleed, colonic ischemia, Dieulafoy's lesion, AVM, or malignancy. With normal platelets upon arrival and lack of liver disease history would not be concerned about variceal bleeding. Plan for CTA GI bleed for initial evaluation if patient is able to be adequately stabilized and if patient's family is agreeable to a contrast load in order to be able to perform the CT GI bleed. I do believe that the patient's hypotension is likely multifactorial in the setting of infection and lower GI bleeding. Patient may also be experiencing cardiogenic shock since there is still potential concern that the patient may have suffered a STEMI. I spoke to the patient's sister and significant other to update them about the plan for evaluation of his hematochezia ?-Trend hemoglobin closely. Transfuse for hemoglobin less than 7  ?-Maintain active type and screen ?-Recommend rechecking PT/INR and PTT ?-Recommend CTA GI bleed to try and identify source of lower GI bleeding.  If a source is identified, recommend contacting IR for further treatment.  If CTA does not reveal a source of GI bleeding, then would plan for colonoscopy for  further evaluation pending discussion with patient's sister who is his HCPOA. ? ? ?HPI:   ? ? ?Vincent Ferrell is a 61 y.o. male with history of GERD, hypertension, HLD, recent pneumonia presents with left-sided weakness, left facial droop, and right gaze deviation found to have a right MCA stroke for which he underwent mechanical revascularization with IR earlier today.  We are consulted for hematochezia that developed this evening.  He had scant amounts of rectal bleeding noted earlier today when he had a Tylenol suppository that was administered.  Has never had GI bleeding like this in the past.  Has passed several episodes of maroon-colored blood clots this evening.  An OG tube was placed that has yellow output.  Denies home blood thinner use.  Has not received any blood thinners during this hospitalization.  Over the last week he has been having some issues with respiratory symptoms and fever for which he has been taking ibuprofen alternating with Tylenol.  Per review of [facility]where notes, patient had last colonoscopy done in 2014 and has a repeat due in 2024. Per patient's sister, patient has been quite private about his medical history in the past. ? ?During this hospitalization patient has been diagnosed with potential endocarditis and pericarditis based upon diffuse ST elevations that were noted on his EKG.  His chest imaging shows pneumonia.  He was also noted to have an AKI upon arrival with creatinine of 1.9, rechecked as 1.7.  INR upon arrival was 1.2.  PTT was  28.  CBC with hemoglobin of 11.8 earlier today, which has subsequently dropped to 7.0. ? ? ?Past Medical History:  ?Diagnosis Date  ? Chest pain in adult 10/24/2017  ? Elevated BP without diagnosis of hypertension 10/24/2017  ? Impaired fasting glucose 10/24/2017  ? Mixed hyperlipidemia 10/24/2017  ? Rising PSA level 10/24/2017  ? ?Prior to Admission medications   ?Medication Sig Start Date End Date Taking? Authorizing Provider  ?benzonatate  (TESSALON) 100 MG capsule Take 100 mg by mouth 3 (three) times daily as needed for cough. 12/22/21   [provider]  ?doxycycline (VIBRAMYCIN) 100 MG capsule Take 100 mg by mouth 2 (two) times daily. 12/22/21   [provider]  ?lisinopril (ZESTRIL) 5 MG tablet Take 5 mg by mouth daily. 12/03/21   [provider]  ?pantoprazole (PROTONIX) 20 MG tablet Take 20 mg by mouth daily. 12/03/21   [provider]  ?simvastatin (ZOCOR) 40 MG tablet Take 40 mg by mouth at bedtime. 12/03/21   [provider]  ? ? ?Current Facility-Administered Medications  ?Medication Dose Route Frequency Provider Last Rate Last Admin  ?  stroke: early stages of recovery book   Does not apply Once Donnetta Simpers, MD      ? 0.9 %  sodium chloride infusion (Manually program via Guardrails IV Fluids)   Intravenous Once Jennelle Human B, NP      ? 0.9 %  sodium chloride infusion (Manually program via Guardrails IV Fluids)   Intravenous Once Cecilie Lowers T, MD      ? 0.9 %  sodium chloride infusion (Manually program via Guardrails IV Fluids)   Intravenous Once Estill Cotta, NP      ? 0.9 %  sodium chloride infusion (Manually program via Guardrails IV Fluids)   Intravenous Once Etheleen Nicks, MD      ? 0.9 %  sodium chloride infusion   Intravenous Continuous Donnetta Simpers, MD   Stopped at 12/25/21 1817  ? 0.9 %  sodium chloride infusion   Intra-arterial PRN Elmer Sow, MD      ? acetaminophen (TYLENOL) tablet 650 mg  650 mg Oral Q4H PRN Luanne Bras, MD      ? Or  ? acetaminophen (TYLENOL) 160 MG/5ML solution 650 mg  650 mg Per Tube Q4H PRN Deveshwar, Willaim Rayas, MD      ? Or  ? acetaminophen (TYLENOL) suppository 650 mg  650 mg Rectal Q4H PRN Deveshwar, Willaim Rayas, MD      ? acetaminophen (TYLENOL) 650 MG suppository           ? azithromycin (ZITHROMAX) 500 mg in sodium chloride 0.9 % 250 mL IVPB  500 mg Intravenous Q24H Jennelle Human B, NP   Stopped at 12/25/21 1800  ? ceFAZolin  (ANCEF) 2-4 GM/100ML-% IVPB           ? cefTRIAXone (ROCEPHIN) 2 g in sodium chloride 0.9 % 100 mL IVPB  2 g Intravenous Q24H Arnell Asal, NP   Paused at 12/25/21 1615  ? chlorhexidine gluconate (MEDLINE KIT) (PERIDEX) 0.12 % solution 15 mL  15 mL Mouth Rinse BID Arnell Asal, NP      ? Chlorhexidine Gluconate Cloth 2 % PADS 6 each  6 each Topical Daily Donnetta Simpers, MD   6 each at 12/25/21 1548  ? clevidipine (CLEVIPREX) infusion 0.5 mg/mL  0-21 mg/hr Intravenous Continuous Deveshwar, Sanjeev, MD      ? dexmedetomidine (PRECEDEX) 400 MCG/100ML (4 mcg/mL) infusion  0-1.2 mcg/kg/hr  Intravenous Continuous Jennelle Human B, NP 20.2 mL/hr at 12/25/21 2224 0.8 mcg/kg/hr at 12/25/21 2224  ? docusate (COLACE) 50 MG/5ML liquid 100 mg  100 mg Per Tube BID Jennelle Human B, NP      ? fentaNYL (SUBLIMAZE) injection 50 mcg  50 mcg Intravenous Q15 min PRN Jennelle Human B, NP      ? fentaNYL (SUBLIMAZE) injection 50-200 mcg  50-200 mcg Intravenous Q30 min PRN Jennelle Human B, NP   100 mcg at 12/25/21 2119  ? lactated ringers bolus 250 mL  250 mL Intravenous Once Elmer Sow, MD      ? MEDLINE mouth rinse  15 mL Mouth Rinse 10 times per day Jennelle Human B, NP      ? nitroGLYCERIN 100 mcg/mL intra-arterial injection           ? norepinephrine (LEVOPHED) 51m in 2549m(0.016 mg/mL) premix infusion  0-40 mcg/min Intravenous Titrated GeEstill CottaNP      ? pantoprazole (PROTONIX) injection 40 mg  40 mg Intravenous Q12H SiJennelle Human, NP      ? polyethylene glycol (MIRALAX / GLYCOLAX) packet 17 g  17 g Per Tube Daily SiJennelle Human, NP      ? senna-docusate (Senokot-S) tablet 1 tablet  1 tablet Oral QHS PRN KhDonnetta SimpersMD      ? sodium bicarbonate 1 mEq/mL injection           ? sodium chloride flush (NS) 0.9 % injection 3 mL  3 mL Intravenous Once RaPattricia BossMD      ? [SDerrill MemoN 12/26/2021] vancomycin (VANCOCIN) IVPB 1000 mg/200 mL premix  1,000 mg Intravenous Q12H AmFranky MachoRPH      ?  vasopressin (PITRESSIN) 20 Units in sodium chloride 0.9 % 100 mL infusion-*FOR SHOCK*  0-0.03 Units/min Intravenous Continuous GeEstill CottaNP 9 mL/hr at 12/25/21 2159 0.03 Units/min at 12/25/21 2159  ? ? ?Aller

## 2021-12-25 NOTE — Procedures (Signed)
INR. ?Bilateral common carotid arteriograms. ?Right CFA approach. ?Findings. ?1.  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 ?8 French Angio-Seal closure device placed for hemostasis at the the right groin puncture site.  Distal pulses all dopplerable unchanged ?Patient extubated. ?Moves right side spontaneously.  No movement in the left arm and leg.  Mild left facial droop. ?Both pupils 3 mm, sluggishly reactive. ? ?Fatima Sanger MD ?

## 2021-12-25 NOTE — Anesthesia Postprocedure Evaluation (Signed)
Anesthesia Post Note ? ?Patient: Vincent Ferrell ? ?Procedure(s) Performed: IR WITH ANESTHESIA ? ?  ? ?Patient location during evaluation: PACU ?Anesthesia Type: General ?Level of consciousness: awake and alert ?Pain management: pain level controlled ?Vital Signs Assessment: post-procedure vital signs reviewed and stable ?Respiratory status: spontaneous breathing, nonlabored ventilation and respiratory function stable ?Cardiovascular status: blood pressure returned to baseline and stable ?Postop Assessment: no apparent nausea or vomiting ?Anesthetic complications: no ? ? ?No notable events documented. ? ?Last Vitals:  ?Vitals:  ? 12/25/21 1400 12/25/21 1415  ?BP: 129/79 123/86  ?Pulse: (!) 144 (!) 145  ?Resp: (!) 39 (!) 32  ?Temp:    ?SpO2: 95% 95%  ?  ?Last Pain: There were no vitals filed for this visit. ? ?  ?  ?  ?  ?  ?  ? ?Lynda Rainwater ? ? ? ? ?

## 2021-12-25 NOTE — Progress Notes (Signed)
RN and team setting up patient for intubation and found a large amount of bloody clots from his rectum. Pt was intubated and cleaned up. CCM MD aware. 1 unit of blood ordered to transfuse. Evone Arseneau, Rande Brunt, RN   ?

## 2021-12-25 NOTE — Progress Notes (Signed)
Echo attempted. Patient HR 150+ at time of attempt. Will attempt again later when HR is appropriate for accurate echo.  ?

## 2021-12-25 NOTE — Consult Note (Signed)
? ?NAME:  Vincent GowdaDavid Wendland, MRN:  [id number], DOB:  1960/11/05, LOS: 0 ?ADMISSION DATE:  12/25/2021, CONSULTATION DATE:  12/25/2021 ?REFERRING MD:  Dr. Corliss Skainseveshwar, CHIEF COMPLAINT:  left sided weakness  ? ?History of Present Illness:  ?HPI mostly obtained from medical chart review.  Attempted from patient but limited given aphasia.  ? ?61 year old male with prior history of HTN, HLD, and GERD who presented to ER as code stroke.  Patient was at the vet's office when he acutely developed left sided weakness and right gaze deviation at 0945.  Code stroke activated by EMS in addition to code STEMI based on diffuse STE.  Of note, patient recently diagnosed with RLL bronchopneumonia two days ago by his PCP.   ? ?He was evaluated by Neurology and Cardiology in ER, HR in 150s with no chest pain or dyspnea, with EKG not meeting criteria for STEMI but showed mild STE, felt possibly rate related versus possible pericarditis.  Echo and cardiac workup ordered.  CT head showed concern for subacute infarcts in the R PCA territory and two small areas possibly representing calcification or blood, therefore thrombolytics deferred.  CTA head/ neck and perfusion study showed acute right MCA anterior M2 occlusion.   ?Labs significant for WBC 20.6, Hgb 9.2, Hct 26.8, plts 205, normal coags, K 4.9, bicarb 21, glucose 138, BUN 34, sCr 1.83, iCa 1.03, low albumin/ protein, lactic acid 1.5. Trop hs pending.  CXR showing elevation of right hemidiaphragm with pulmonary vascular congestion.  Blood cultures sent.   Temp 100 in ER, remains ST, and initial BP 140/90, and remains on room air > 94%.  He was taken emergently to Neuro IR for mechanical thrombectomy with complete revascularization achieving TICI 3 revascularization.  Patient was extubated post intervention.  Briefly required phenylephrine in PACU.  Received 2L LR and albumin in procedure.  PCCM consulted for further medical assistance given concern for sepsis and possible cardioembolic  event related to stroke. ? ?Patient is currently only able to nod yes/ no to question given expressive aphasia and mostly incomprehensible speech.  Nodded yes to recent fever, cough with productive sputum, and headache.  Nods yes to recent bloody stools.  Did indicate that he started antibiotics from PCP.  Denied current or previous SOB, CP, palpitations, N/V/abd pain, lower leg swelling.   Patient indicates he lives alone, not married, no children, and has living parents out of state.  Non smoker, occasional but not daily ETOH, denies drug abuse.  ? ?Pertinent  Medical History  ?HLT, HLD, GERD, rising PSA level, impaired fasting glucose ? 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  Cards consulted for initial code STEMI with diffuse STE, no STEMI   ? ?Interim History / Subjective:  ? ? ?Objective   ?Blood pressure (!) 135/95, pulse (!) 138, temperature 100 ?F (37.8 ?C), resp. rate (!) 22, weight 100.8 kg, SpO2 94 %. ?   ?   ? ?Intake/Output Summary (Last 24 hours) at 12/25/2021 1337 ?Last data filed at 12/25/2021 1233 ?Gross per 24 hour  ?Intake 1300 ml  ?Output --  ?Net 1300 ml  ? ?Filed Weights  ? 12/25/21 1000  ?Weight: 100.8 kg  ? ? ?Examination: ?General:  Well nourished adult male lying flat in PACU stretcher in semi reverse trendelenburg  ?HEENT: MM pink/dry, pupils 3/reactive, anicertic, facial droop present, no obvious dental abnormalities ?  Neuro:  mostly incomprehensible slurred speech but is awake and follows specific commands in RUE, RLL and decreased movement in LLL, LUE remains flaccid.  ?CV: rr, ST, no obvious murmur ?PULM:  tachypneic, mild work of breathing, sounds audibly rhonchus at times, felt to be upper airway, lungs clear and diminished in bases  ?GI: soft, +bs, voiding, found to have dark red/brownish liquid stool   ?Extremities: warm/dry, no LE edema, previous left middle finger amputation ?Skin: see pictures.  Splinter hemorrhage noted on left middle finger and suspected janeway lesions to right hand and soles of feet as these are not painful to patient ? ? ? ? ? ? ? ? ? ?Resolved Hospital Problem list   ? ?Assessment & Plan:  ? ?Acute R MCA, M2 occlusion s/p mechanical thrombectomy with complete revascularization.  ?- Per Neuro and Neuro IR  ?- SBP goal 120-140 ?- stroke workup per Neurology ?- serial neuro exams ?- further imaging per Neuro, MRI ordered when stable  ?- still flaccid in left arm but getting some movement in LLL ?- suspected cardio-embolic source, workup as below.  Additional small areas on [facility] concerning for blood vs calcification -> ?septic emboli  ? ? ?RLL pneumonia ?High risk for aspiration/ respiratory insufficiency related to above  ?- supplemental O2 prn ?- high risk for reintubation, monitor closely ?- reverse trendelenburg for now for HOB up ?- NPO ?- CAP coverage as below  ?- checking strep ua ?- CXR in am  ? ?Sepsis ?Suspected IE  ?- given fever, janeway lesions, leukocytosis, diffuse mild STE on EKG ?P:  ?- additional LR bolus now ?- remains normotensive currently, map goal > 65, SBP for stroke 120-140 ?- tylenol prn fever  ?- previous post procedure hypotension resolved, goal MAP remains > 65 ?- pending trop hs ?- lactic acid 1.5, reassuring  ?- abx - vanc, ceftriaxone, azithro for now ?- follow cultures  ?- pending PCT  ?- echo when HR better ?- pending UA ? ?Diffuse mild STE  ?- suspected to be related to possible IE as above vs sepsis vs stress demand ?- echo, troponin's pending ?- consider cardiology consult  ?- likely will need TEE  ?- blood cultures and abx ordered  ? ?AKI  ?- s/p 2L LR and albumin in OR, additional LR bolus now ?- unclear baseline sCr  ?- foley removed as was leaking ?- pending UA ?- consider renal ultrasound if worsening renal function ?- serial renal indices, strict  I/Os / urinary output ?- Replace electrolytes as indicated ?- Avoid nephrotoxic agents, ensure adequate renal perfusion ? ?Normocytic anemia  ?Suspected GI bleed- although denied any previous N/V and abd pain ?- PPI BID ?- serial H/H q 6 ?- stat T&S ?- hold on GI consult for now ? ? ?Best Practice (right click and "Reselect all SmartList Selections" daily)  ? ?Diet/type: NPO ?DVT prophylaxis: LMWH ?GI prophylaxis: PPI ?Lines: N/A ?Foley:  N/A ?Code Status:  full code ?Last date of multidisciplinary goals of care discussion [pending] ? ?Unknown parents number.  Patient did not want ER staff to contact his girlfriend.   ? ?Labs   ?CBC: ?Recent Labs  ?Lab 12/25/21 ?1032 12/25/21 ?1048  ?WBC  --  20.0*  ?NEUTROABS  --  16.3*  ?HGB 11.9* 11.8*  ?HCT 35.0* 35.0*  ?MCV  --  91.1  ?PLT  --  199  ? ? ?Basic Metabolic Panel: ?Recent Labs  ?Lab 12/25/21 ?1032 12/25/21 ?1048  ?NA 139 138  ?K 4.8 4.9  ?  CL 108 108  ?CO2  --  21*  ?GLUCOSE 139* 138*  ?BUN 34* 34*  ?CREATININE 1.90* 1.83*  ?CALCIUM  --  7.9*  ? ?GFR: ?CrCl cannot be calculated (Unknown ideal weight.). ?Recent Labs  ?Lab 12/25/21 ?1048  ?WBC 20.0*  ? ? ?Liver Function Tests: ?Recent Labs  ?Lab 12/25/21 ?1048  ?AST 29  ?ALT 34  ?ALKPHOS 100  ?BILITOT 0.5  ?PROT 5.7*  ?ALBUMIN 2.1*  ? ?No results for input(s): LIPASE, AMYLASE in the last 168 hours. ?No results for input(s): AMMONIA in the last 168 hours. ? ?ABG ?   ?Component Value Date/Time  ? TCO2 21 (L) 12/25/2021 1032  ?  ? ?Coagulation Profile: ?Recent Labs  ?Lab 12/25/21 ?1048  ?INR 1.2  ? ? ?Cardiac Enzymes: ?No results for input(s): CKTOTAL, CKMB, CKMBINDEX, TROPONINI in the last 168 hours. ? ?HbA1C: ?No results found for: HGBA1C ? ?CBG: ?Recent Labs  ?Lab 12/25/21 ?1025  ?GLUCAP 135*  ? ? ?Review of Systems:   ?Limited given aphasia- see HPI  ? ?Past Medical History:  ?He,  has a past medical history of Chest pain in adult (10/24/2017), Elevated BP without diagnosis of hypertension (10/24/2017), Impaired  fasting glucose (10/24/2017), Mixed hyperlipidemia (10/24/2017), and Rising PSA level (10/24/2017).  ? ?Surgical History:  ?Unable  ? ?Social History:  ?  Lives alone, non smoker, occasional ETOH, denies drug use  ? ?Family

## 2021-12-25 NOTE — Anesthesia Procedure Notes (Signed)
Procedure Name: Intubation ?Date/Time: 12/25/2021 11:12 AM ?Performed by: Mayer Camel, CRNA ?Pre-anesthesia Checklist: Patient identified, Emergency Drugs available, Suction available and Patient being monitored ?Patient Re-evaluated:Patient Re-evaluated prior to induction ?Oxygen Delivery Method: Circle System Utilized ?Preoxygenation: Pre-oxygenation with 100% oxygen ?Induction Type: IV induction and Rapid sequence ?Laryngoscope Size: Hyacinth Meeker and 2 ?Grade View: Grade I ?Tube type: Oral ?Tube size: 7.5 mm ?Number of attempts: 1 ?Airway Equipment and Method: Stylet and Oral airway ?Placement Confirmation: ETT inserted through vocal cords under direct vision, positive ETCO2 and breath sounds checked- equal and bilateral ?Secured at: 23 cm ?Tube secured with: Tape ?Dental Injury: Teeth and Oropharynx as per pre-operative assessment  ? ? ? ? ?

## 2021-12-25 NOTE — Progress Notes (Signed)
Pharmacy Antibiotic Note ? ?Vincent Ferrell is a 61 y.o. 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  Pharmacy has been consulted for vancomycin dosing. ? ?Plan: ?Vancomycin 1000mg  IV q24h. Goal AUC 400-550. ?Expected AUC: 462, SCr used: 1.83, Vd coeff 0.5 ?Will f/u renal function, micro data, and pt's clinical condition ?Vanc levels prn ? ?Height: 5' 11.5" (181.6 cm) ?Weight: 100.8 kg (222 lb 3.6 oz) ?IBW/kg (Calculated) : 76.45 ? ?Temp (24hrs), Avg:100.9 ?F (38.3 ?C), Min:100 ?F (37.8 ?C), Max:101.5 ?F (38.6 ?C) ? ?Recent Labs  ?Lab 12/25/21 ?1032 12/25/21 ?1048 12/25/21 ?1315 12/25/21 ?1520  ?WBC  --  20.0* 20.6*  --   ?CREATININE 1.90* 1.83*  --   --   ?LATICACIDVEN  --   --  1.5 1.5  ? ?  ?Estimated Creatinine Clearance: 52.3 mL/min (A) (by C-G formula based on SCr of 1.83 mg/dL (H)).   ? ?Not on File ? ?Antimicrobials this admission: ?Vancomycin 4/14 >>  ?Rocephin 4/14 >> ?Azithromycin 4/14>> ? ?Microbiology results: ?4/14 BCx: ?4/14 Trach asp: ?4/14 MRSA PCR: ? ?Thank you for allowing pharmacy to be a part of this patient?s care. ? ?Sherlon Handing, PharmD, BCPS ?Please see amion for complete clinical pharmacist phone list ?12/25/2021 8:11 PM ? ? ? ?

## 2021-12-25 NOTE — Progress Notes (Signed)
Patient was noted to be tachypneic with a decreased responsiveness, he continued to spike fever and remained tachycardic to 140s ? ?Due to decreased responsiveness and high risk of aspiration, decision was to intubate and place him on mechanical ventilator.  Patient's family was contacted they were in agreement, all questions answered, patient was intubated and placed on mechanical ventilator successfully. ? ?We will start him on Precedex and fentanyl infusions with RASS goal -2/-3 ? ? ? ?Additional critical care time: 31 minutes ? ?Performed by: Cheri Fowler ?  ?Critical care time was exclusive of separately billable procedures and treating other patients. ?  ?Critical care was necessary to treat or prevent imminent or life-threatening deterioration. ?  ?Critical care was time spent personally by me on the following activities: development of treatment plan with patient and/or surrogate as well as nursing, discussions with consultants, evaluation of patient's response to treatment, examination of patient, obtaining history from patient or surrogate, ordering and performing treatments and interventions, ordering and review of laboratory studies, ordering and review of radiographic studies, pulse oximetry and re-evaluation of patient's condition. ?  ?Cheri Fowler MD ?[facility] ?See Amion for pager ?If no response to pager, please call [phone] until 7pm ?After 7pm, Please call E-link [phone] ? ?

## 2021-12-25 NOTE — Progress Notes (Signed)
Patient's sister Vincent Ferrell lives in [place] and was updated over the phone. Pt's girlfriend, Vincent Ferrell, is at the bedside. Pt gave permission for them both to be updated.  A password was set up for the chart. Aviv Lengacher, [place] Scrape, RN   ?

## 2021-12-25 NOTE — Sedation Documentation (Signed)
Patient transported to PACU with CRNA-Shane. Kaitlyn RN at the bedside to receive patient. Groin site assessed. Clean, dry and intact. Soft to palpation, no hematoma noted. +2 distal pulses intact.  ?

## 2021-12-25 NOTE — Progress Notes (Addendum)
eLink Physician-Brief Progress Note ?Patient Name: Vincent Ferrell ?DOB: September 25, 1960 ?MRN: [id number] ? ? ?Date of Service ? 12/25/2021  ?HPI/Events of Note ? Pt with lower GIB having bloody stools, receivng PRBC x 1 for Hgb 9.0. Ordered to maintain SBP 120-140, now 106 with MAP 88. Pt only has PIVs and precedex running. ? ?Discussed with RN. ?Re intubated in evening. ? ?Camera: ?Fresh and clotted blood per rectum. MAP < 65. Getting PRBC. ?On Vent, in synchrony. On precedex gtt. ?Sinus tachy 112. ? ?GI been notified earlier  by NP, before this  shift, not on order list. ?Abdomen is soft. Not distended.  ?A: ?Hypotension. LGI bleeding. NG no  bleeding. ?No TNK, just thrombectomy for CVA earlier. ? 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 Elevated troponin. Seen by cards already.  ?Demand cardiac ischemia ?Acute kidney injury-hyperkalemia.  ? ? ?   ?eICU Interventions ? - Levophed via 18 G PIV, has 4 PIV. Asking for a central line soon. ?- will call GI consultation. Cr > 1.7. might need CTA abdomen and IR intervention ?- stat ABG, troponin and LA follow up. ?- hyperkalemia-insuline-dextrose ordered. Follow BMP and Hg post PRBC. ?- SCD ?- on Protonix q12 hr. ?- on abx for pneumonia.  ?  ? ? ? ?Intervention Category ?Intermediate Interventions: Bleeding - evaluation and treatment with blood products ? ?Ranee Gosselin ?12/25/2021, 8:05 PM ? ?21:00 ? ?Camera: ?MAP < 65, levo at 10. ?Notified bed side CCM for evaluation for bleeding and central line. ? ?Completed PRBC. ? ?Plan: ?- stat 2 units PRBC ordered on emergent basis. Follow Hg. ?- GI been contacted from bed side RN. He is going to call us, per RN for further recommendations. ?- LR 250 ml bolus ?- wean fio2. pH 7.48.  ?- updated ground team ? ?21:04 ?GI Dr Leonides Schanz called back. If family take risk of contrast/CTA due to AKI, will  go for it.  ?- place art line. ?Bolus- MAP > 70 now ? ?22;25 ?Camera: ?Getting 4 th unit, FFP, platelet. Trendelenberg position. LA 3. Troponin decreasing.  ?Once stable to go for CTA. Discussed with RN. ?Hyperkalemia down to 5.7 from > 6.5, treated earlier. Follow levels at mid night ? ? ?23:47 ?Camera re eval as a follow through; ? ?Hg 9.6, VS  stable. Getting ready to go for CTA ? ?GI notes reviewed ?Follow labs post PRBC, CTA.  ? ?2:12 ?CTA chest, abd, pelvis: no bleeding. SBO +. Bed side notifying GI Dr Leonides Schanz. ? ?IMPRESSION: ?1. Partial small bowel obstruction with a transition zone seen ?within the anterior aspect of the mid abdomen. ?2. Moderate severity areas of posterior bilateral upper lobe and ?bilateral lower lobe consolidation, concerning for multifocal ?pneumonia. ?3. Small bilateral pleural effusions. ?4. 2.4 cm x 3.5 cm x 2.9 cm lobulated area of low attenuation within ?the inferolateral aspect of a markedly enlarged prostate gland on ?the left. Correlation with PSA values is recommended. ?5. Cholelithiasis. ? ? ? ? ?

## 2021-12-25 NOTE — H&P (Signed)
NEUROLOGY CONSULTATION NOTE  ? ?Date of service: December 25, 2021 ?Patient Name: Vincent Ferrell ?MRN:  [id number] ?DOB:  1961/04/26 ? ?_ _ _   _ __   _ __ _ _  __ __   _ __   __ _ ? ?History of Present Illness  ?Vincent Ferrell is a 61 y.o. male with PMH significant for HLD, HTN, impaired fasting glucose, recent diagnosis of bronchopneumonia by PCP on 12/22/21 and on doxycycline who had sudden onset L sided weakness, L facial droop and R gaze deviation [facility]. ? ?Symptoms started at 0945. He was noted to be tachycardic to 140s-150s by EMS and EKG with diffuse ST elevation and he was brought in as a code stroke and code STEMI. ED team discussed case with Dr. Martinique with cardiology and felt this was more concerning for pericarditis rather than STEMI. ? ?On arrival, patient denies any chest pain, no shortness of breath. He has no immediate family in [place]. Endorses being [facility] this [facility] when he collapsed. Unable to provide detailed history thou. He has L sided neglect and does not recognize his L side at  all. We were unable to get in touch with any immediate family on the listed phone numbers. ? ?LKW: 12/25/21 0945. ?mRS: 0 ?tNKASE: not offered, CTH demonstrates subacute appearing infarct in the R PCA territory along with 2 small L MCA stroke. Suspicion for potential endocarditis given recent pneumonia, EKG concerning for pericarditis, leukocytosis and subacute embolic appearing infarcts along with ?janeway lesions in palms and splinter hemorrhages in finger nails. ?Thrombectomy: yes offered, found to have a R MCA M2 anterior division occlusion. Unable to get in touch with family and thus 2 phsycician emergent consent was obtained. I discussed the case with Dr. Juliet Rude and we both agreed that an emergent thrombectomy would be beneficial. ? ? ?NIHSS components Score: Comment  ?1a Level of Conscious 0[x]  1[]  2[]  3[]      ?1b LOC Questions 0[x]  1[]  2[]        ?1c LOC Commands 0[x]  1[]  2[]       ?2 Best Gaze 0[]  1[x]  2[]       ?3 Visual 0[]  1[]  2[x]  3[]      ?4 Facial Palsy 0[]  1[]  2[x]  3[]      ?5a Motor Arm - left 0[]  1[]  2[]  3[]  4[x]  UN[]    ?5b Motor Arm - Right 0[x]  1[]  2[]  3[]  4[]  UN[]    ?6a Motor Leg - Left 0[]  1[]  2[]  3[]  4[x]  UN[]    ?6b Motor Leg - Right 0[]  1[x]  2[]  3[]  4[]  UN[]    ?7 Limb Ataxia 0[x]  1[]  2[]  3[]  UN[]     ?8 Sensory 0[]  1[]  2[x]  UN[]      ?9 Best Language 0[]  1[]  2[x]  3[]      ?10 Dysarthria 0[]  1[]  2[x]  UN[]      ?11 Extinct. and Inattention 0[]  1[x]  2[]       ?TOTAL: 21   ?  ?ROS  ? ?Unable to obtain detailed ROS given dysarthric speech, bradyphrenia/aphasia. ? ?Past History  ? ?Past Medical History:  ?Diagnosis Date  ?? Chest pain in adult 10/24/2017  ?? Elevated BP without diagnosis of hypertension 10/24/2017  ?? Impaired fasting glucose 10/24/2017  ?? Mixed hyperlipidemia 10/24/2017  ?? Rising PSA level 10/24/2017  ? ? ?No family history on file. ?Social History  ? ?Socioeconomic History  ?? Marital status: Unknown  ?  Spouse name: Not on file  ?? Number of children: Not  on file  ?? Years of education: Not on file  ?? Highest education level: Not on file  ?Occupational History  ?? Not on file  ?Tobacco Use  ?? Smoking status: Not on file  ?? Smokeless tobacco: Not on file  ?Substance and Sexual Activity  ?? Alcohol use: Not on file  ?? Drug use: Not on file  ?? Sexual activity: Not on file  ?Other Topics Concern  ?? Not on file  ?Social History Narrative  ?? Not on file  ? ?Social Determinants of Health  ? ?Financial Resource Strain: Not on file  ?Food Insecurity: Not on file  ?Transportation Needs: Not on file  ?Physical Activity: Not on file  ?Stress: Not on file  ?Social Connections: Not on file  ? ?Not on File ? ?Medications  ?(Not in a hospital admission) ?  ? ?Vitals  ? ?Vitals:  ? 12/25/21 1000 12/25/21 1030  ?BP:  (!) 139/91  ?Weight: 100.8 kg   ?  ? ?There is no height or weight on file to calculate BMI. ? ?Physical Exam  ? ?General: Laying  comfortably in bed; in no acute distress.  ?HENT: Normal oropharynx and mucosa. Normal external appearance of ears and nose.  ?Neck: Supple, no pain or tenderness  ?CV: No JVD. No peripheral edema.  ?Pulmonary: Symmetric Chest rise. Normal respiratory effort.  ?Abdomen: Soft to touch, non-tender.  ?Ext: No cyanosis, edema, or deformity  ?Skin: No rash. Normal palpation of skin.   ?Musculoskeletal: Normal digits and nails by inspection. No clubbing.  ? ?Neurologic Examination  ?Mental status/Cognition: Alert, oriented to self, poor attention.  ?Speech/language: soft, non fluent, dysarthric speech. Follows 1 step commands with only LUE. ?Cranial nerves:  ? CN II Pupils equal and reactive to light, L hemianopsia.  ? CN III,IV,VI R gaze preference.  ? CN V Corneals intact BL  ? CN VII L facial droop  ? CN VIII normal hearing to speech  ? CN IX & X normal palatal elevation, no uvular deviation  ? CN XI   ? CN XII midline tongue protrusion  ? ?Motor:  ?Muscle bulk: poor, tone flaccid in LUE and LLE. ?Unable to do detailed strength testing secondary to aphasia and bradyphrenia/somnolence. ?He will hold his right upper extremity off the bed for more than 10 seconds with no drift. ?Right lower extremity will drift when held off the bed. ?No movement in left upper extremity and left lower extremity. ? ?Sensation: ? Light touch Absent left upper extremity and left lower extremity.  ? Pin prick   ? Temperature   ? Vibration   ?Proprioception   ? ?Coordination/Complex Motor:  ?- Finger to Nose intact with RUE ?- Gait: Deferred for patient safety. ? ?Labs  ? ?CBC:  ?Recent Labs  ?Lab 12/25/21 ?1032 12/25/21 ?1048  ?WBC  --  20.0*  ?NEUTROABS  --  16.3*  ?HGB 11.9* 11.8*  ?HCT 35.0* 35.0*  ?MCV  --  91.1  ?PLT  --  199  ? ? ?Basic Metabolic Panel:  ?Lab Results  ?Component Value Date  ? NA 139 12/25/2021  ? K 4.8 12/25/2021  ? GLUCOSE 139 (H) 12/25/2021  ? BUN 34 (H) 12/25/2021  ? CREATININE 1.90 (H) 12/25/2021  ? ?Lipid Panel: No  results found for: [place] ?HgbA1c: No results found for: HGBA1C ?Urine Drug Screen: No results found for: LABOPIA, COCAINSCRNUR, [place], [place], THCU, LABBARB  ?Alcohol Level No results found for: ETH ? ?CT Head without contrast(Personally reviewed): ?1. No Right MCA  acute cortically based infarct.  ASPECTS 10. ?  ?2. Subacute appearing small infarct in the Right PCA territory, ?occipital pole. And at least two small areas of chronic appearing ?posterior Left MCA territory encephalomalacia, possibly with chronic ?hemosiderin. ? ?CT angio Head and Neck with contrast(Personally reviewed): ?Right MCA M2 anterior division occluded. ? ?CT perfusion (Personally reviewed): ?CT Perfusion estimates a core infarct of 40 mL, despite normal ?ASPECTS. ?72 mL total estimated penumbra volume with hypoperfusion index of ?0.4. ? ?MRI Brain: ?pending ? ?Impression  ? ?Qwanell Pelegrin is a 61 y.o. male with PMH significant for HLD, HTN, impaired fasting glucose, recent diagnosis of bronchopneumonia by PCP on 12/22/21 and on doxycycline who had sudden onset L sided weakness, L facial droop and R gaze deviation. Found to have proximal R MCA M2 occlusion. ?He was not offered tNKASE due to noted subacute embolic appearing infarcts BL and suspicion for infective endocarditis. He was taken for thrombectomy after emergency 2 physician consent after discussion between me and Dr. Estanislado Pandy due to no identifiable next of kin. ? ?Primary Diagnosis:  ?Cerebral infarction due to embolism of  right middle cerebral artery.  ? ?Secondary Diagnosis: ?Essential (primary) hypertension ?Right lower lobe pneumonia. ?Concern for infective endocarditis. ? ?Recommendations  ? ?Acute ischemic R MCA stroke with Right MCA M2 occlusion undergoing mechanical thrombectomy: ?- Frequent Neuro checks per Neuro ICU protocol ?- Brain imaging with MRI Brain without contrast ?- TTE ?- Recommend obtaining Lipid panel with LDL ?- Please start statin if LDL > 70 ?-  Recommend HbA1c ?- Antithrombotic - Per Neuro IR for the first 24 hours after intervention. ?- DVT ppx ?- SBP goal - per Neuro IR for the first 24 hours after intervention. ?- Recommend Telemetry monitor

## 2021-12-25 NOTE — Progress Notes (Signed)
Patient ID: Vincent Ferrell, male   DOB: [birth year], 61 y.o.   MRN: [id number] ?INR. ?61 year old male right-handed.  Last seen well 9:45 AM. ?New onset left-sided paralysis, and right gaze deviation..  CT of the brain demonstrates no intracranial hemorrhage.  CT angiogram of the head and neck shows occlusion of her superior division right middle cerebral artery. ? ?CT perfusion maps show a 40 mm core, and Tmax greater than 6 seconds of 72 mL with mismatch of 32 mL. ? ?Patient did not appropriate for endovascular revascularization given the neurological examination, in the neuroimaging findings.  A two-physician consent was obtained as no family member available or reachable by telephone.  Patient unable to provide consent due to medical condition. ? ?Fatima Sanger MD ?

## 2021-12-25 NOTE — Progress Notes (Signed)
Sister in [place] [phone] updated by phone.  ?

## 2021-12-25 NOTE — Progress Notes (Signed)
Patient arrived to 4N26 from PACU. CCM NP at bedside within few min. She is aware of the bloody stool smear seen by PACU. We cleaned him up and found more red colored stool. Pt says he started noticing it yesterday. Pt's groin site from procedure is soft and clean. Pulses are palpable. LR bolus and labs ordered. HR still in the 150's and Trop elevated. Providers aware. Kayley Zeiders, Rande Brunt, RN   ?

## 2021-12-25 NOTE — Code Documentation (Addendum)
Stroke Response Nurse Documentation ?Code Documentation ? ?Vincent Ferrell is a 61 y.o. male arriving to [facility]  via [facility] on 12/25/21 with past medical hx of HTN, GERD, bronchopneumonia, HLD, chest pain. On aspirin 81 mg daily. Code stroke was activated by EMS. BP 144/96. HR in the 150's. Concern for ST elevation. No chest pain. ? ?Patient at the [facility] where he was LKW at [place] and now complaining of right gaze, left-sided weakness, slurred speech and facial droop. Patient was at a vet appointment and LSN at 0945 when he came up to the desk and sat down and the staff noticed his facial droop and slurred speech. They called EMS. [facility] arrived and activated the code stroke.  ? ?Stroke team at the bedside on patient arrival. Labs drawn and patient cleared for CT by Dr. Billy Fischer. EKG showing sinus tach and per EDP not a candidate for code stemi. Patient to CT with team. NIHSS 20, see documentation for details and code stroke times. Patient with right gaze preference , left hemianopia, right facial droop, left arm weakness, left leg weakness, left decreased sensation, Expressive aphasia , dysarthria , and Sensory  neglect on exam. The following imaging was completed:  CT Head, CTA, and CTP. Patient is not a candidate for IV Thrombolytic due to subacute strokes seen on CT scan per MD. Patient is a candidate for IR due to CTP results and discussion between IR MD and neurologist. Code IR activated by rapid response RN. Patient taken to IR and prepped for procedure in the suite. 4N made aware of bed need.  ? ?Care Plan: IR for thrombectomy and admission to ICU.  ? ?Bedside handoff with IR RN.   ? ?Raidyn Breiner, Rande Brunt  ?Stroke Response RN ?  ?

## 2021-12-25 NOTE — Procedures (Signed)
Name:  [name] ?MRN:  [id number] ?DOB:  11/09/1960 ? ?PROCEDURE NOTE ? ?Procedure:  Central venous catheter placement. ? ?Indications:  Need for intravenous access and hemodynamic monitoring. ? ?Consent:  Consent was implied due to the emergency nature of the procedure. ? ?Anesthesia:  A total of 10 mL of 1% Lidocaine was used for local infiltration anesthesia. ? ?Procedure summary:  Appropriate equipment was assembled.  The patient was identified as Vincent Ferrell and safety timeout was performed. The patient was placed in Trendelenburg position.  Sterile technique was used. The patient's Left anterior chest wall was prepped using chlorhexidine / alcohol scrub and the field was draped in usual sterile fashion with full body drape. After the adequate anesthesia was achieved, the Left subclavian vein was cannulated with the introducer needle without difficulty. A guide wire was advanced through the introducer needle, which was then withdrawn. A small skin incision was made at the point of wire entry, the dilator was inserted over the guide wire and appropriate dilation was obtained. The dilator was removed and 7 French triple-lumen catheter was advanced over the guide wire, which was then removed.  All ports were aspirated and flushed with normal saline without difficulty. The catheter was secured into place at 18 cm. Antibiotic patch was placed and sterile dressing was applied. Post-procedure chest x-ray was ordered. ? ?Complications:  No immediate complications were noted.  Hemodynamic parameters and oxygenation remained stable throughout the procedure. ? ?Estimated blood loss:  Less then 5 mL. ? ? ? ?12/25/2021, 9:50 PM  ?

## 2021-12-25 NOTE — Progress Notes (Signed)
?  PCCM interval note ? ?-   large red bloody stool noted.  LGIB suspected. ? Ischemic given today's events, although admit lactate was reassuring.  Pending repeat.  Will transfuse 1unit now and recheck post transfusion ?- serial H/H ordered ?-   called GI consult- Vincent Ferrell, will see.  Ideally would like CTA GIB study however he came in with AKI then to NIR.  Will recheck BMET now, if sCr ok, will proceed with CTA study.  If not, consider NM red tagged study and possibly IR.  ?-  GI ask that if he has more bloody stools and if requires vasopressor support to please let them know.   ? ? ?Additional CCT 20 mins ? ? ?Vincent Ferrell, ACNP ?[facility] ?12/25/2021, 7:16 PM ? ?See Amion for pager ?If no response to pager, please call PCCM consult pager ?After 7:00 pm call Elink   ? ?

## 2021-12-25 NOTE — Transfer of Care (Signed)
Immediate Anesthesia Transfer of Care Note ? ?Patient: Vincent Ferrell ? ?Procedure(s) Performed: IR WITH ANESTHESIA ? ?Patient Location: PACU ? ?Anesthesia Type:General ? ?Level of Consciousness: unresponsive and drowsy ? ?Airway & Oxygen Therapy: Patient Spontanous Breathing and Patient connected to face mask oxygen ? ?Post-op Assessment: Report given to RN and Post -op Vital signs reviewed and stable ? ?Post vital signs: Reviewed ? ?Last Vitals:  ?Vitals Value Taken Time  ?BP 131/86 12/25/21 1246  ?Temp    ?Pulse 131 12/25/21 1248  ?Resp 27 12/25/21 1248  ?SpO2 100 % 12/25/21 1248  ?Vitals shown include unvalidated device data. ? ?Last Pain: There were no vitals filed for this visit.   ? ?  ? ?Complications: No notable events documented. ?

## 2021-12-25 NOTE — Sedation Documentation (Signed)
Robin-Bed control notified of active code stroke at [facility] IR. No ICU beds at this time  ?

## 2021-12-25 NOTE — Anesthesia Preprocedure Evaluation (Signed)
Anesthesia Evaluation  ?Patient identified by MRN, date of birth, ID band ?Patient awake ? ? ? ?Reviewed: ?Allergy & Precautions, NPO status , Patient's Chart, lab work & pertinent test results ? ?Airway ?Mallampati: II ? ?TM Distance: >3 FB ?Neck ROM: Full ? ? ? Dental ?no notable dental hx. ? ?  ?Pulmonary ?neg pulmonary ROS,  ?  ?Pulmonary exam normal ?breath sounds clear to auscultation ? ? ? ? ? ? Cardiovascular ?negative cardio ROS ?Normal cardiovascular exam ?Rhythm:Regular Rate:Normal ? ? ?  ?Neuro/Psych ?CVA negative psych ROS  ? GI/Hepatic ?negative GI ROS, Neg liver ROS,   ?Endo/Other  ?negative endocrine ROS ? Renal/GU ?negative Renal ROS  ?negative genitourinary ?  ?Musculoskeletal ?negative musculoskeletal ROS ?(+)  ? Abdominal ?  ?Peds ?negative pediatric ROS ?(+)  Hematology ?negative hematology ROS ?(+)   ?Anesthesia Other Findings ?Acute Stroke ? Reproductive/Obstetrics ?negative OB ROS ? ?  ? ? ? ? ? ? ? ? ? ? ? ? ? ?  ?  ? ? ? ? ? ? ? ? ?Anesthesia Physical ?Anesthesia Plan ? ?ASA: 4 and emergent ? ?Anesthesia Plan: General  ? ?Post-op Pain Management:   ? ?Induction: Intravenous ? ?PONV Risk Score and Plan: 2 and Ondansetron, Midazolam and Treatment may vary due to age or medical condition ? ?Airway Management Planned: Oral ETT ? ?Additional Equipment:  ? ?Intra-op Plan:  ? ?Post-operative Plan: Post-operative intubation/ventilation ? ?Informed Consent: I have reviewed the patients History and Physical, chart, labs and discussed the procedure including the risks, benefits and alternatives for the proposed anesthesia with the patient or authorized representative who has indicated his/her understanding and acceptance.  ? ? ? ?Dental advisory given ? ?Plan Discussed with: CRNA ? ?Anesthesia Plan Comments:   ? ? ? ? ? ? ?Anesthesia Quick Evaluation ? ?

## 2021-12-25 NOTE — Procedures (Signed)
Intubation Procedure Note ? ?Vincent Ferrell  ?[id number]  ?[birth year] ? ?Date:12/25/21  ?Time:6:10 PM  ? ?Provider Performing:Maurilio Puryear  ? ? ?Procedure: Intubation (31500) ? ?Indication(s) ?Respiratory Failure ? ?Consent ?Risks of the procedure as well as the alternatives and risks of each were explained to the patient and/or caregiver.  Consent for the procedure was obtained and is signed in the bedside chart ? ? ?Anesthesia ?Etomidate and Rocuronium ? ? ?Time Out ?Verified patient identification, verified procedure, site/side was marked, verified correct patient position, special equipment/implants available, medications/allergies/relevant history reviewed, required imaging and test results available. ? ? ?Sterile Technique ?Usual hand hygeine, masks, and gloves were used ? ? ?Procedure Description ?Patient positioned in bed supine.  Sedation given as noted above.  Patient was intubated with endotracheal tube using  DL .  View was Grade 2 only posterior commissure .  Number of attempts was 1.  Colorimetric CO2 detector was consistent with tracheal placement. ? ? ?Complications/Tolerance ?None; patient tolerated the procedure well. ?Chest X-ray is ordered to verify placement. ? ? ?EBL ?Minimal ? ? ?Specimen(s) ?None ? ?

## 2021-12-25 NOTE — ED Provider Notes (Signed)
?[facility] EMERGENCY DEPARTMENT ?Provider Note ? ? ?CSN: [id number] ?Arrival date & time: 12/25/21  1022 ? ?An emergency department physician performed an initial assessment on this suspected stroke patient at 44. ? ?History ? ?No chief complaint on file. ? ? ?Yacob Wilkerson is a 61 y.o. male. ? ?HPI ? ?  ? ?61 year old male with history of hyperlipidemia, essential hypertension, GERD, impaired fasting glucose, rising PSA, recent diagnosis of bronchopneumonia 2 days ago through PCP, presents as a code stroke with acute left-sided paralysis and right gaze deviation. ? ?He was also noted to have ST elevations inferiorly on his EKG. ? ?He was bringing his pet to the vet, when acutely at 945 developed left-sided weakness.  EMS was called and a code stroke was called.  They also called a code STEMI based off of EKG changes. ? ?History is limited by acuity of condition, slurred speech. ?He is able to state his name.  Denies chest pain, shortness of breath, headache.  Acknowledges that the left side feels different. ? ?Past Medical History:  ?Diagnosis Date  ? Chest pain in adult 10/24/2017  ? Elevated BP without diagnosis of hypertension 10/24/2017  ? Impaired fasting glucose 10/24/2017  ? Mixed hyperlipidemia 10/24/2017  ? Rising PSA level 10/24/2017  ?  ? ?Home Medications ?Prior to Admission medications   ?Not on File  ?   ? ?Allergies    ?Patient has no allergy information on record.   ? ?Review of Systems   ?Review of Systems ? ?Physical Exam ?Updated Vital Signs ?BP (!) 139/91   Wt 100.8 kg  ?Physical Exam ?Vitals and nursing note reviewed.  ?Constitutional:   ?   General: He is not in acute distress. ?   Appearance: He is well-developed. He is not diaphoretic.  ?HENT:  ?   Head: Normocephalic and atraumatic.  ?Eyes:  ?   Conjunctiva/sclera: Conjunctivae normal.  ?Cardiovascular:  ?   Rate and Rhythm: Normal rate and regular rhythm.  ?   Heart sounds: Normal heart sounds. No murmur heard. ?  No  friction rub. No gallop.  ?Pulmonary:  ?   Effort: Pulmonary effort is normal. No respiratory distress.  ?   Breath sounds: Normal breath sounds. No wheezing or rales.  ?Abdominal:  ?   General: There is no distension.  ?   Palpations: Abdomen is soft.  ?   Tenderness: There is no abdominal tenderness. There is no guarding.  ?Musculoskeletal:  ?   Cervical back: Normal range of motion.  ?Skin: ?   General: Skin is warm and dry.  ?Neurological:  ?   Mental Status: He is alert and oriented to person, place, and time.  ?   GCS: GCS eye subscore is 4. GCS verbal subscore is 5. GCS motor subscore is 6.  ?   Cranial Nerves: Dysarthria present.  ?   Sensory: Sensory deficit present.  ?   Motor: Weakness present.  ?   Comments: Right sided gaze deviation ?Left facial droop, left sided paralysis ?Able to name things, thumb, ring, fish, month of april  ? ? ?ED Results / Procedures / Treatments   ?Labs ?(all labs ordered are listed, but only abnormal results are displayed) ?Labs Reviewed  ?CBC - Abnormal; Notable for the following components:  ?    Result Value  ? WBC 20.0 (*)   ? RBC 3.84 (*)   ? Hemoglobin 11.8 (*)   ? HCT 35.0 (*)   ? All other components within  normal limits  ?DIFFERENTIAL - Abnormal; Notable for the following components:  ? Neutro Abs 16.3 (*)   ? Monocytes Absolute 1.4 (*)   ? Abs Immature Granulocytes 0.71 (*)   ? All other components within normal limits  ?CBG MONITORING, ED - Abnormal; Notable for the following components:  ? Glucose-Capillary 135 (*)   ? All other components within normal limits  ?I-STAT CHEM 8, ED - Abnormal; Notable for the following components:  ? BUN 34 (*)   ? Creatinine, Ser 1.90 (*)   ? Glucose, Bld 139 (*)   ? Calcium, Ion 1.03 (*)   ? TCO2 21 (*)   ? Hemoglobin 11.9 (*)   ? HCT 35.0 (*)   ? All other components within normal limits  ?RESP PANEL BY RT-PCR (FLU A&B, COVID) ARPGX2  ?PROTIME-INR  ?APTT  ?COMPREHENSIVE METABOLIC PANEL  ?HIV ANTIBODY (ROUTINE TESTING W REFLEX)   ?HEMOGLOBIN A1C  ?CBC  ?I-STAT CHEM 8, ED  ?CBG MONITORING, ED  ? ? ?EKG ?EKG Interpretation ? ?Date/Time:  Friday December 25 2021 10:55:58 EDT ?Ventricular Rate:  148 ?PR Interval:  142 ?QRS Duration: 78 ?QT Interval:  260 ?QTC Calculation: 408 ?R Axis:   8 ?Text Interpretation: Sinus tachycardia Mild diffuse STE, consider pericarditis Abnormal ECG No previous ECGs available Confirmed by Alvira MondaySchlossman, Donae Kueker ([id number]) on 12/25/2021 11:03:06 AM ? ?Radiology ?CT CEREBRAL PERFUSION W CONTRAST ? ?Result Date: 12/25/2021 ?CLINICAL DATA:  Code stroke. 61 year old male with right MCA M2 ELVO. EXAM: CT PERFUSION BRAIN TECHNIQUE: Multiphase CT imaging of the brain was performed following IV bolus contrast injection. Subsequent parametric perfusion maps were calculated using RAPID software. RADIATION DOSE REDUCTION: This exam was performed according to the departmental dose-optimization program which includes automated exposure control, adjustment of the mA and/or kV according to patient size and/or use of iterative reconstruction technique. CONTRAST:  100mL OMNIPAQUE IOHEXOL 350 MG/ML SOLN COMPARISON:  CTA the same day. FINDINGS: CT Brain Perfusion Findings: CBF (<30%) Volume: 40mL. 10 mL of detected CBV changes in the right MCA middle [place] territory. Perfusion (Tmax>6.0s) volume: 72mL. Hypoperfusion index of 0.4, 30 mL of T-max greater than 10 seconds in the right MCA territory. Mismatch Volume: 32mL (mismatch ratio 1.8). ASPECTS on noncontrast CT Head: 10 at 1034 hours today. Infarction Location:Right MCA IMPRESSION: Right MCA territory ischemia. CT Perfusion estimates a core infarct of 40 mL, despite normal ASPECTS. 72 mL total estimated penumbra volume with hypoperfusion index of 0.4. Electronically Signed   By: Odessa FlemingH  Hall M.D.   On: 12/25/2021 11:09  ? ?CT HEAD CODE STROKE WO CONTRAST` ? ?Result Date: 12/25/2021 ?CLINICAL DATA:  Code stroke. 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 EXAM: CT HEAD WITHOUT CONTRAST TECHNIQUE: Contiguous axial images were obtained from the base of the skull through the vertex without intravenous contrast. RADIATION DOSE REDUCTION: This exam was performed according to the departmental dose-optimization program which includes automated exposure control, adjustment of the mA and/or kV according to patient size and/or use of iterative reconstruction technique. COMPARISON:  None. FINDINGS: Brain: There are at least 2 small areas of chronic appearing cortical encephalomalacia in the posterior left MCA territory (series 2, images 29 and 24). Associated with the 2nd area is a small circumscribed crescent shaped 6 mm density best seen on sagittal image 44. This most resembles dystrophic calcification or chronic blood. No mass effect there. Superimposed small right occipital pole roughly 18 mm area of well-developed cortical edema (series 2, image 15  and series 6, image 30) with no evidence of hemorrhage or mass effect there. No other cortical infarct changes identified. No acute intracranial hemorrhage or mass effect. No ventriculomegaly. Normal basilar cisterns. Vascular: No suspicious intracranial vascular hyperdensity. Skull: Negative. Sinuses/Orbits: Well aerated paranasal sinuses. Occasional small polypoid areas of mucosal thickening. Tympanic cavities and mastoids are clear. Other: Orbit motion artifact. Visualized scalp soft tissues are within normal limits. ASPECTS [facility] Stroke Program Early CT Score) Total score (0-10 with 10 being normal): 10 (negative right MCA territory and chronic appearing cortical encephalomalacia on the left). IMPRESSION: 1. No Right MCA acute cortically based infarct.  ASPECTS 10. 2. Subacute appearing small infarct in the Right PCA territory, occipital pole. And at least two small areas of chronic appearing posterior Left MCA territory encephalomalacia, possibly with chronic hemosiderin. 3. Study discussed by telephone with  Dr. Erick Blinks on 12/25/2021 beginning at 1036 hours. Electronically Signed   By: Odessa Fleming M.D.   On: 12/25/2021 10:57  ? ?CT ANGIO HEAD NECK W WO CM (CODE STROKE) ? ?Result Date: 12/25/2021 ?CLINICAL DATA:  Cod

## 2021-12-25 NOTE — Procedures (Signed)
Arterial Catheter Insertion Procedure Note ? ?Vincent Ferrell  ?[id number]  ?1961-03-20 ? ?Date:12/25/21  ?Time:10:03 PM  ? ? ?Provider Performing: Donnelly Angelica  ? ? ?Procedure: Insertion of Arterial Line (72536) without US guidance ? ?Indication(s) ?Blood pressure monitoring and/or need for frequent ABGs ? ?Consent ?Unable to obtain consent due to emergent nature of procedure. ? ?Anesthesia ?None ? ? ?Time Out ?Verified patient identification, verified procedure, site/side was marked, verified correct patient position, special equipment/implants available, medications/allergies/relevant history reviewed, required imaging and test results available. ? ? ?Sterile Technique ?Maximal sterile technique including full sterile barrier drape, hand hygiene, sterile gown, sterile gloves, mask, hair covering, sterile ultrasound probe cover (if used). ? ? ?Procedure Description ?Area of catheter insertion was cleaned with chlorhexidine and draped in sterile fashion. Without real-time ultrasound guidance an arterial catheter was placed into the left radial artery.  Appropriate arterial tracings confirmed on monitor.   ? ? ?Complications/Tolerance ?None; patient tolerated the procedure well. ? ? ?EBL ?Minimal ? ?20G catheter placed on first attempt.  Site dressed and secured per RT protocol.  Line leveled and zeroed.  Normal saline flush used. Good draw back and waveform.  ? ? ?Specimen(s) ?None ? ?

## 2021-12-26 ENCOUNTER — Inpatient Hospital Stay (HOSPITAL_COMMUNITY): Payer: Managed Care, Other (non HMO)

## 2021-12-26 DIAGNOSIS — I63511 Cerebral infarction due to unspecified occlusion or stenosis of right middle cerebral artery: Secondary | ICD-10-CM

## 2021-12-26 DIAGNOSIS — K56609 Unspecified intestinal obstruction, unspecified as to partial versus complete obstruction: Secondary | ICD-10-CM

## 2021-12-26 DIAGNOSIS — N400 Enlarged prostate without lower urinary tract symptoms: Secondary | ICD-10-CM

## 2021-12-26 DIAGNOSIS — R509 Fever, unspecified: Secondary | ICD-10-CM

## 2021-12-26 DIAGNOSIS — A419 Sepsis, unspecified organism: Secondary | ICD-10-CM

## 2021-12-26 DIAGNOSIS — K922 Gastrointestinal hemorrhage, unspecified: Secondary | ICD-10-CM

## 2021-12-26 DIAGNOSIS — R6521 Severe sepsis with septic shock: Secondary | ICD-10-CM

## 2021-12-26 LAB — CBC WITH DIFFERENTIAL/PLATELET
Abs Immature Granulocytes: 1.1 10*3/uL — ABNORMAL HIGH (ref 0.00–0.07)
Basophils Absolute: 0.1 10*3/uL (ref 0.0–0.1)
Basophils Relative: 0 %
Eosinophils Absolute: 0 10*3/uL (ref 0.0–0.5)
Eosinophils Relative: 0 %
HCT: 27.2 % — ABNORMAL LOW (ref 39.0–52.0)
Hemoglobin: 9.6 g/dL — ABNORMAL LOW (ref 13.0–17.0)
Immature Granulocytes: 5 %
Lymphocytes Relative: 9 %
Lymphs Abs: 2 10*3/uL (ref 0.7–4.0)
MCH: 30.2 pg (ref 26.0–34.0)
MCHC: 35.3 g/dL (ref 30.0–36.0)
MCV: 85.5 fL (ref 80.0–100.0)
Monocytes Absolute: 2 10*3/uL — ABNORMAL HIGH (ref 0.1–1.0)
Monocytes Relative: 9 %
Neutro Abs: 17.1 10*3/uL — ABNORMAL HIGH (ref 1.7–7.7)
Neutrophils Relative %: 77 %
Platelets: 202 10*3/uL (ref 150–400)
RBC: 3.18 MIL/uL — ABNORMAL LOW (ref 4.22–5.81)
RDW: 15.4 % (ref 11.5–15.5)
WBC: 22.3 10*3/uL — ABNORMAL HIGH (ref 4.0–10.5)
nRBC: 0 % (ref 0.0–0.2)

## 2021-12-26 LAB — PREPARE FRESH FROZEN PLASMA: Unit division: 0

## 2021-12-26 LAB — BPAM PLATELET PHERESIS
Blood Product Expiration Date: 202304162359
ISSUE DATE / TIME: 202304142207
Unit Type and Rh: 6200

## 2021-12-26 LAB — PREPARE PLATELET PHERESIS: Unit division: 0

## 2021-12-26 LAB — BASIC METABOLIC PANEL
Anion gap: 8 (ref 5–15)
BUN: 40 mg/dL — ABNORMAL HIGH (ref 6–20)
CO2: 20 mmol/L — ABNORMAL LOW (ref 22–32)
Calcium: 6.6 mg/dL — ABNORMAL LOW (ref 8.9–10.3)
Chloride: 112 mmol/L — ABNORMAL HIGH (ref 98–111)
Creatinine, Ser: 1.99 mg/dL — ABNORMAL HIGH (ref 0.61–1.24)
GFR, Estimated: 38 mL/min — ABNORMAL LOW (ref >60)
Glucose, Bld: 299 mg/dL — ABNORMAL HIGH (ref 70–99)
Potassium: 4.9 mmol/L (ref 3.5–5.1)
Sodium: 140 mmol/L (ref 135–145)

## 2021-12-26 LAB — LIPID PANEL
Cholesterol: 89 mg/dL (ref 0–200)
HDL: <10 mg/dL — ABNORMAL LOW (ref >40)
Triglycerides: 214 mg/dL — ABNORMAL HIGH (ref <150)
VLDL: 43 mg/dL — ABNORMAL HIGH (ref 0–40)

## 2021-12-26 LAB — RAPID URINE DRUG SCREEN, HOSP PERFORMED
Amphetamines: NOT DETECTED
Barbiturates: NOT DETECTED
Benzodiazepines: NOT DETECTED
Cocaine: NOT DETECTED
Opiates: NOT DETECTED
Tetrahydrocannabinol: NOT DETECTED

## 2021-12-26 LAB — STREP PNEUMONIAE URINARY ANTIGEN: Strep Pneumo Urinary Antigen: NEGATIVE

## 2021-12-26 LAB — ECHOCARDIOGRAM COMPLETE
AR max vel: 3.5 cm2
AV Area VTI: 3.12 cm2
AV Area mean vel: 3.02 cm2
AV Mean grad: 3 mmHg
AV Peak grad: 5.2 mmHg
Ao pk vel: 1.14 m/s
Area-P 1/2: 4.33 cm2
Height: 71.5 in
S' Lateral: 3.2 cm
Weight: 3555.58 oz

## 2021-12-26 LAB — GLUCOSE, CAPILLARY
Glucose-Capillary: 109 mg/dL — ABNORMAL HIGH (ref 70–99)
Glucose-Capillary: 132 mg/dL — ABNORMAL HIGH (ref 70–99)
Glucose-Capillary: 156 mg/dL — ABNORMAL HIGH (ref 70–99)
Glucose-Capillary: 197 mg/dL — ABNORMAL HIGH (ref 70–99)
Glucose-Capillary: 257 mg/dL — ABNORMAL HIGH (ref 70–99)
Glucose-Capillary: 276 mg/dL — ABNORMAL HIGH (ref 70–99)

## 2021-12-26 LAB — POTASSIUM: Potassium: 5.3 mmol/L — ABNORMAL HIGH (ref 3.5–5.1)

## 2021-12-26 LAB — BPAM FFP
Blood Product Expiration Date: 202304192359
ISSUE DATE / TIME: 202304142207
Unit Type and Rh: 6200

## 2021-12-26 LAB — LDL CHOLESTEROL, DIRECT: Direct LDL: 37.7 mg/dL (ref 0–99)

## 2021-12-26 IMAGING — CT CT ANGIO CHEST-ABD-PELV FOR DISSECTION W/ AND WO/W CM
3 of 10 series · 9 of 46 positions shown, 15 images · IV contrast (Omni 300)
Comparison: None.

CLINICAL DATA: Evaluation for GI bleed.

EXAM:
CT ANGIOGRAPHY CHEST, ABDOMEN AND PELVIS
TECHNIQUE: Non-contrast CT of the chest was initially obtained.

[Series 7: arterial 3.0 · axial · arterial · 0.82mm/px · z∈[-668,-596]mm · 2 of 239 slices shown]
[im 24/239  soft-tissue]
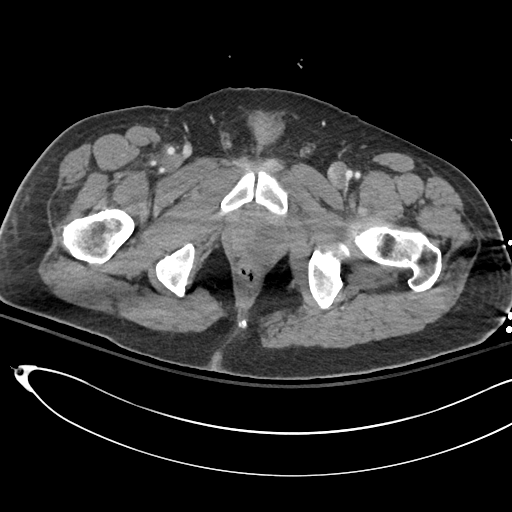
[im 48/239  soft-tissue]
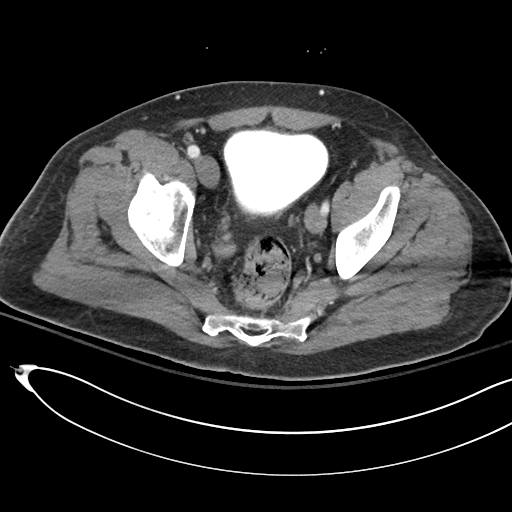

[Series 9: arterial cor · coronal · arterial · 0.98mm/px · 2 of 183 slices shown, 3 images]
[im 61/183  soft-tissue]
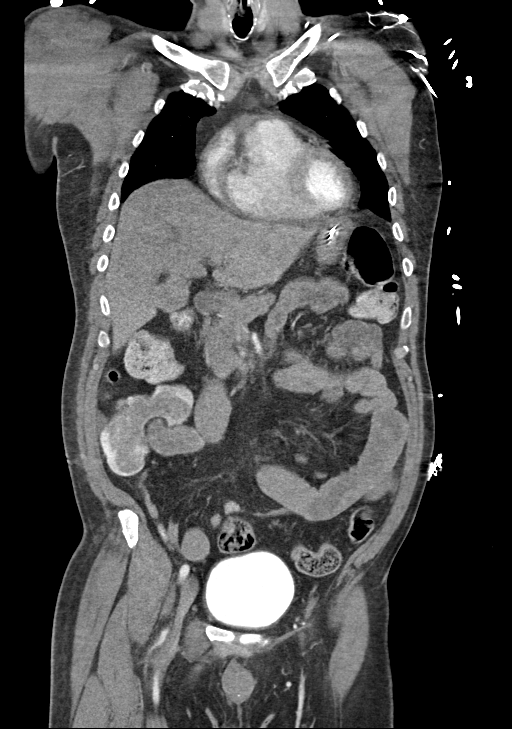
[im 61/183  bone]
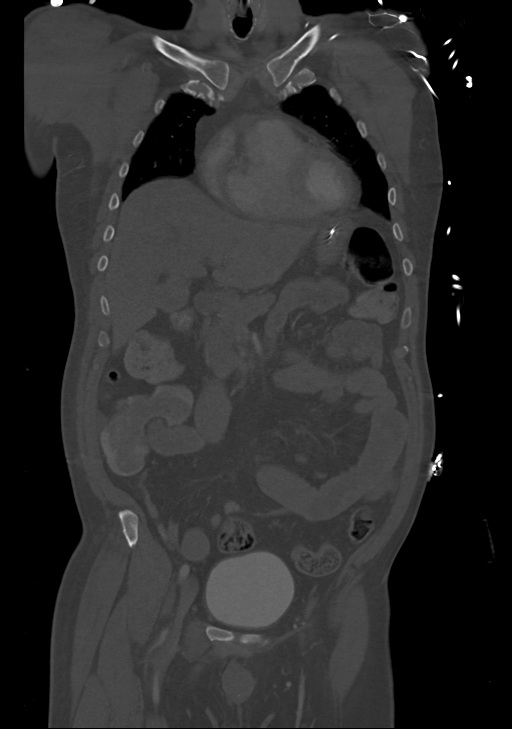
[im 122/183  soft-tissue]
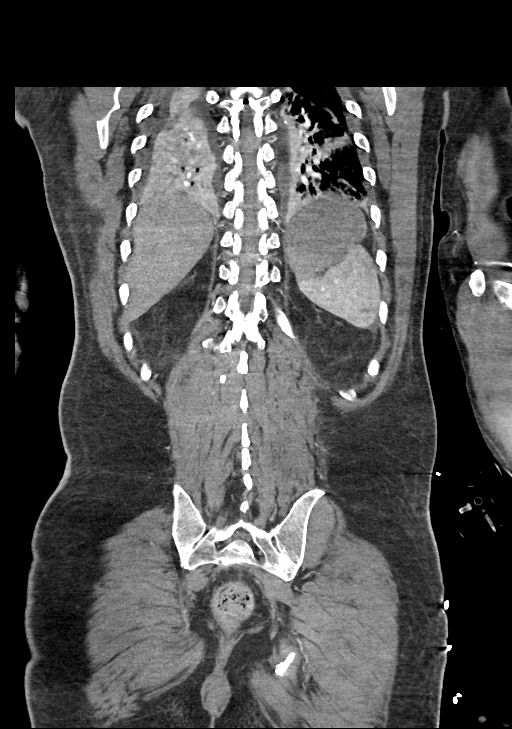

[Series 13: portal venous · axial · portal-venous · 0.82mm/px · z∈[-622,-142]mm · 5 of 144 slices shown, 10 images]
[im 24/144  soft-tissue]
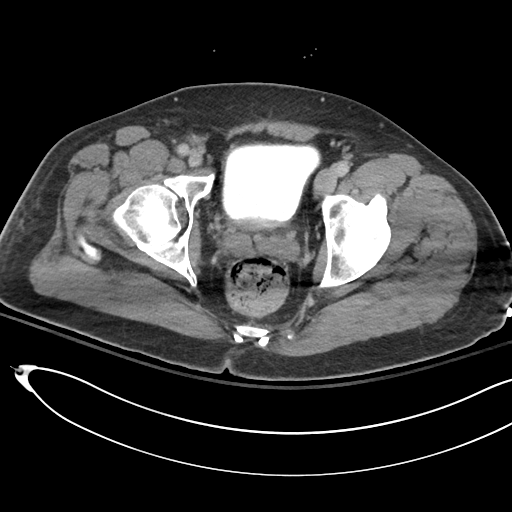
[im 24/144  bone]
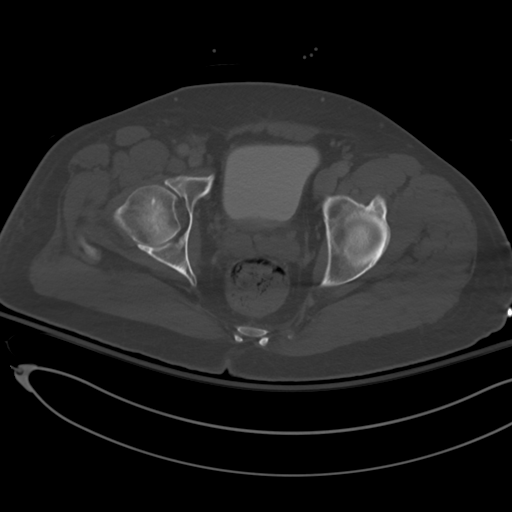
[im 48/144  soft-tissue]
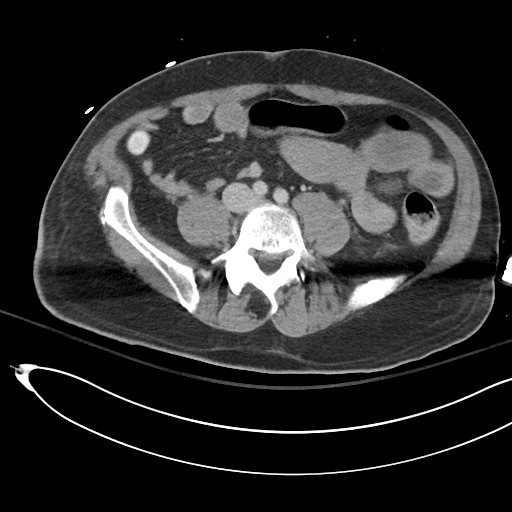
[im 48/144  lung]
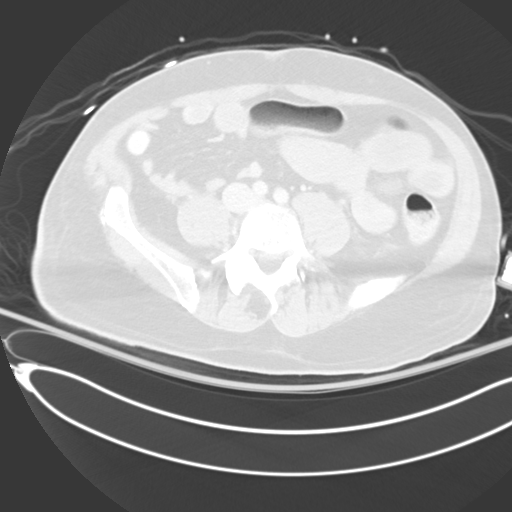
[im 72/144  soft-tissue]
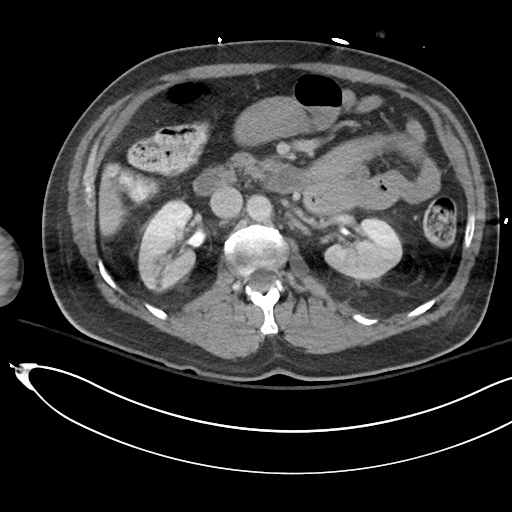
[im 72/144  lung]
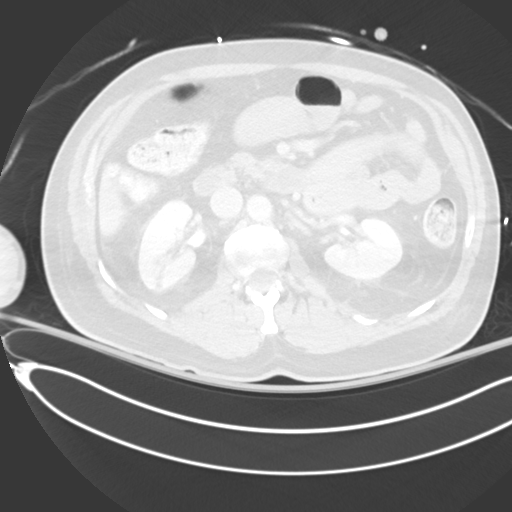
[im 96/144  soft-tissue]
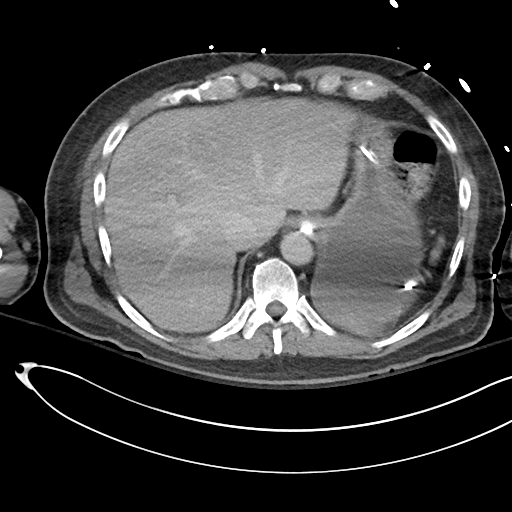
[im 96/144  lung]
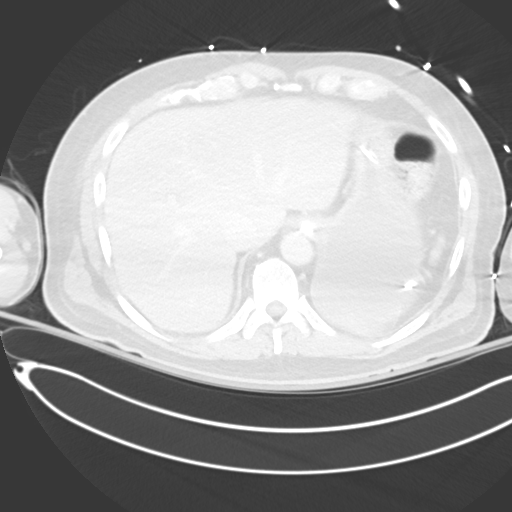
[im 120/144  soft-tissue]
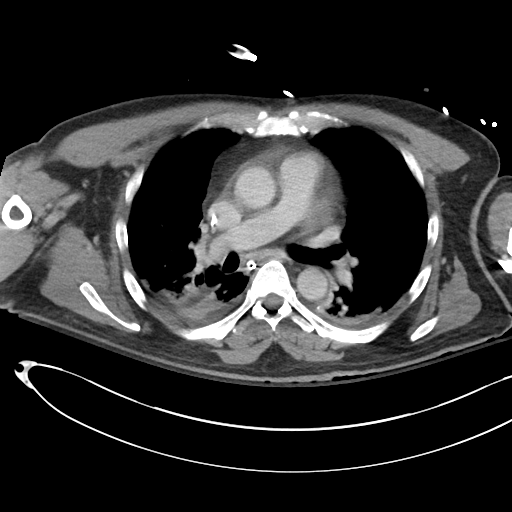
[im 120/144  lung]
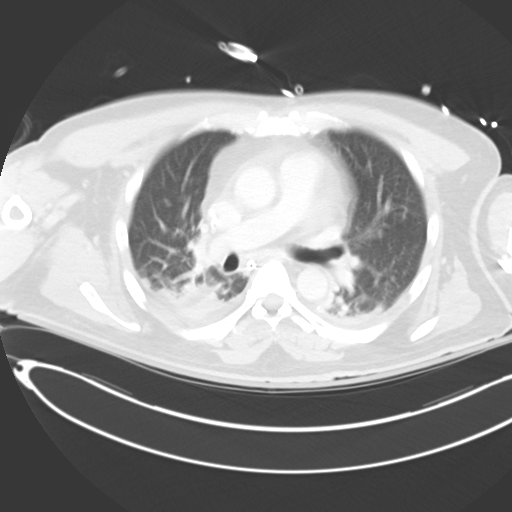

[9 of 46 positions shown; findings below may reference images not displayed]

Multidetector CT imaging through the chest, abdomen and pelvis was
performed using the standard protocol during bolus administration of
intravenous contrast. Multiplanar reconstructed images and MIPs were
obtained and reviewed to evaluate the vascular anatomy.

RADIATION DOSE REDUCTION: This exam was performed according to the
departmental dose-optimization program which includes automated
exposure control, adjustment of the mA and/or kV according to
patient size and/or use of iterative reconstruction technique.

CONTRAST:  100mL OMNIPAQUE IOHEXOL 350 MG/ML SOLN
FINDINGS: CTA CHEST FINDINGS

Cardiovascular: There is moderate to marked severity calcification
of the aortic arch, without evidence of aortic aneurysm or
dissection. Satisfactory opacification of the pulmonary arteries to
the segmental level. No evidence of pulmonary embolism. There is
mild cardiomegaly. A trace amount of pericardial effusion is noted.

Mediastinum/Nodes: Endotracheal and nasogastric tubes are in place.
No enlarged mediastinal, hilar, or axillary lymph nodes. Thyroid
gland, trachea, and esophagus demonstrate no significant findings.

Lungs/Pleura: Moderate severity linear atelectasis is seen within
the anterior aspect of the right upper lobe.

Moderate severity areas of consolidation are noted within the
posterior aspects of the bilateral upper lobes and bilateral lower
lobes.

Small bilateral pleural effusions are seen.

No pneumothorax is identified.

Musculoskeletal: Degenerative changes are seen throughout the
thoracic spine.

Review of the MIP images confirms the above findings.

CTA ABDOMEN AND PELVIS FINDINGS

VASCULAR

Aorta: Normal caliber aorta without aneurysm, dissection, vasculitis
or significant stenosis.

Celiac: Patent without evidence of aneurysm, dissection, vasculitis
or significant stenosis.

SMA: Patent without evidence of aneurysm, dissection, vasculitis or
significant stenosis.

Renals: Both renal arteries are patent without evidence of aneurysm,
dissection, vasculitis, fibromuscular dysplasia or significant
stenosis.

IMA: Patent without evidence of aneurysm, dissection, vasculitis or
significant stenosis.

Inflow: Patent without evidence of aneurysm, dissection, vasculitis
or significant stenosis.

Veins: No obvious venous abnormality within the limitations of this
arterial phase study.

Review of the MIP images confirms the above findings.

NON-VASCULAR

Hepatobiliary: A 7 mm focus of parenchymal low attenuation is seen
within the anterior aspect of the left lobe of the liver. This is
too small to characterize by CT examination. Numerous tiny
gallstones are seen within the lumen of an otherwise
normal-appearing gallbladder.

Pancreas: Unremarkable. No pancreatic ductal dilatation or
surrounding inflammatory changes.

Spleen: A 2.9 cm x 1.6 cm cyst versus hemangioma is seen within the
anterior aspect of an otherwise normal-appearing spleen.

Adrenals/Urinary Tract: Adrenal glands are unremarkable. Kidneys are
normal in size, without renal calculi or hydronephrosis.
Subcentimeter simple cysts are seen within both kidneys. No
additional follow-up or imaging is recommended. Contrast is seen
throughout the lumen of a normal appearing urinary bladder.

Stomach/Bowel: Stomach is within normal limits. Appendix appears
normal. Multiple dilated small bowel loops are seen within the
abdomen (maximum small bowel diameter of approximately 3.0 cm). A
transition zone is seen within the anterior aspect of the mid
series 7).

Lymphatic: No abnormal abdominal or pelvic lymph nodes are
identified.

Reproductive: A 2.4 cm x 3.5 cm x 2.9 cm lobulated area of low
attenuation (approximately 30.68 Hounsfield units) is seen within
the inferolateral aspect of a markedly enlarged prostate gland on
the left.

Other: No abdominal wall hernia or abnormality. No abdominopelvic
ascites.

Musculoskeletal: Degenerative changes seen throughout the lumbar
spine.

Review of the MIP images confirms the above findings.
IMPRESSION: 1. Partial small bowel obstruction with a transition zone seen
within the anterior aspect of the mid abdomen.
2. Moderate severity areas of posterior bilateral upper lobe and
bilateral lower lobe consolidation, concerning for multifocal
pneumonia.
3. Small bilateral pleural effusions.
4. 2.4 cm x 3.5 cm x 2.9 cm lobulated area of low attenuation within
the inferolateral aspect of a markedly enlarged prostate gland on
the left. Correlation with PSA values is recommended.
5. Cholelithiasis.

Aortic Atherosclerosis ([5T]-[5T]).

## 2021-12-26 IMAGING — MR MR MRA HEAD W/O CM
1 series · 19 of 48 positions shown · non-contrast
Comparison: CTA head and neck [DATE].  Revascularization films.

CLINICAL DATA: Acute infarct. Revascularization of superior right
M2 segment.

EXAM:
MRA HEAD WITHOUT CONTRAST
TECHNIQUE: Angiographic images of the Circle of Willis were acquired using MRA
technique without intravenous contrast.

[Series 3: ax (id) · axial · 1.0mm · 0.43mm/px · z∈[-43,+71]mm · 19 of 241 slices shown]
[im 1/241]
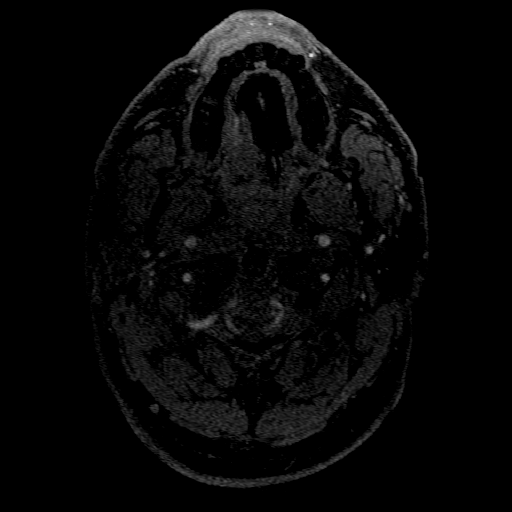
[im 6/241]
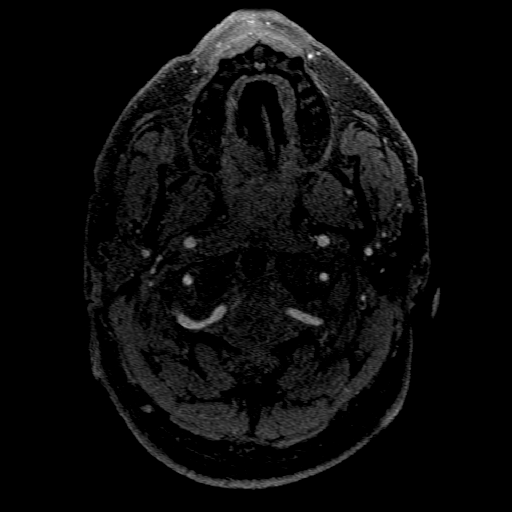
[im 11/241]
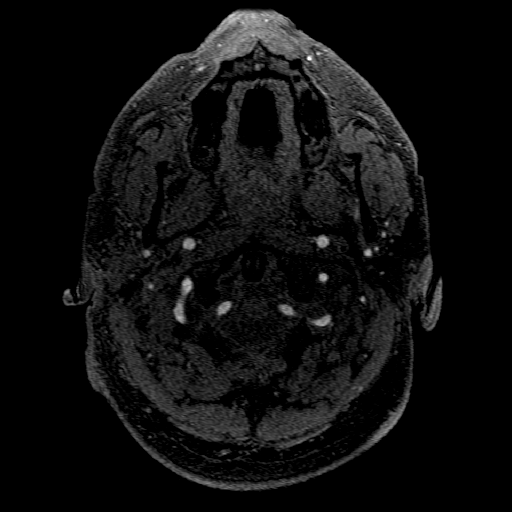
[im 16/241]
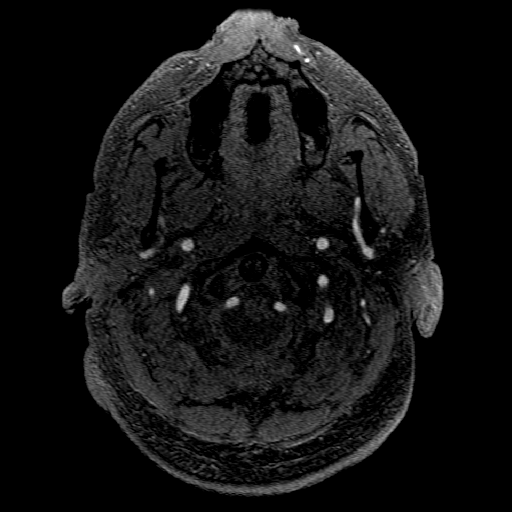
[im 21/241]
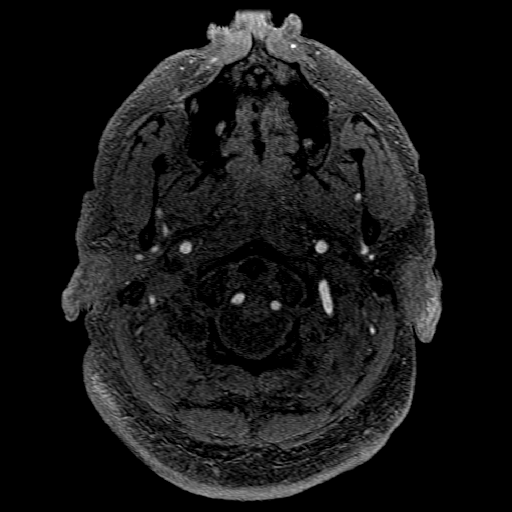
[im 26/241]
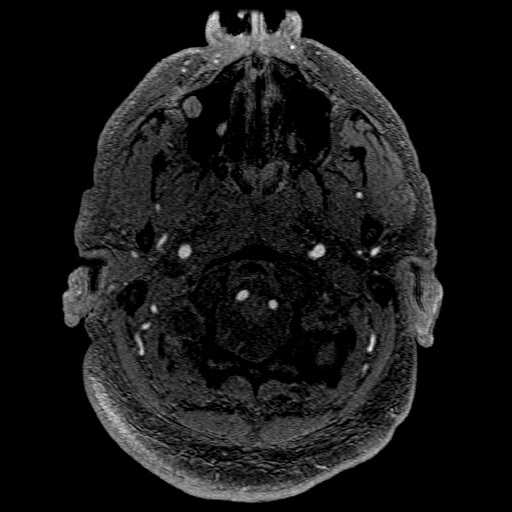
[im 31/241]
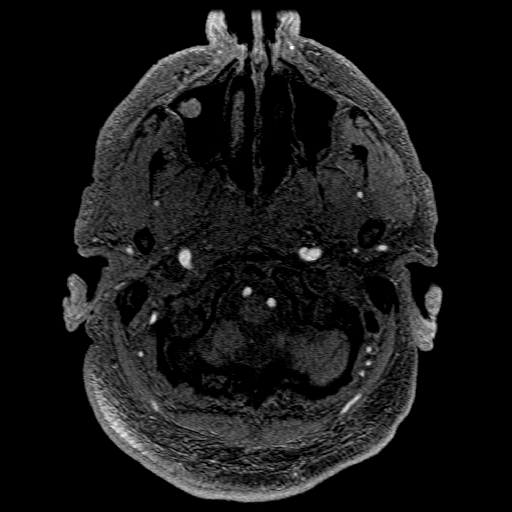
[im 36/241]
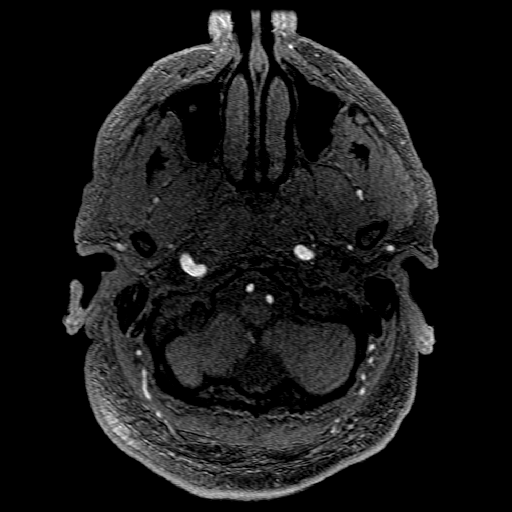
[im 41/241]
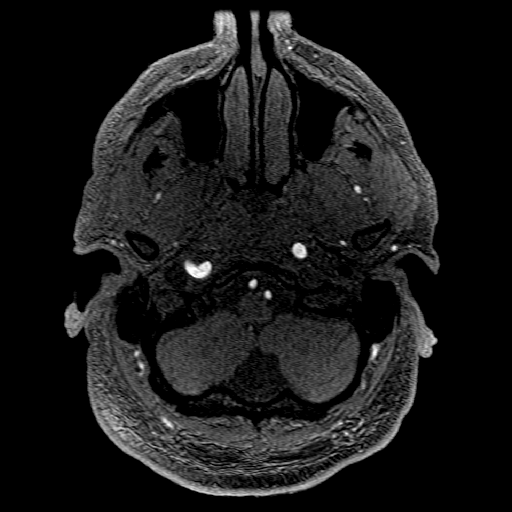
[im 46/241]
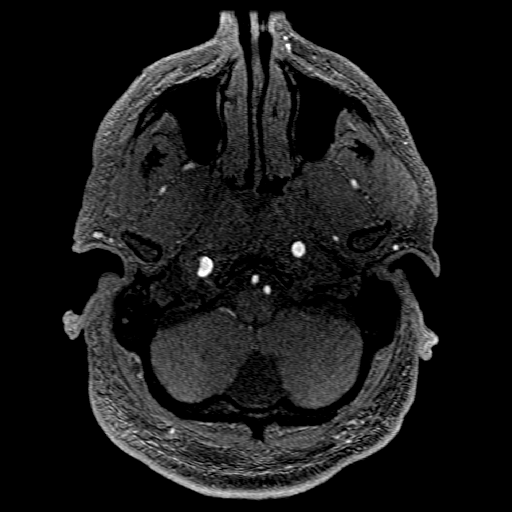
[im 52/241]
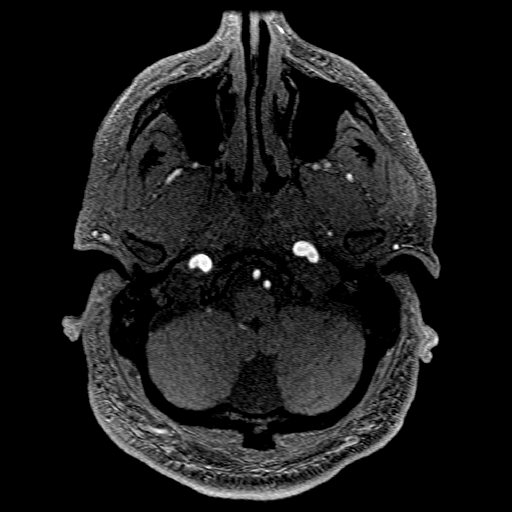
[im 77/241]
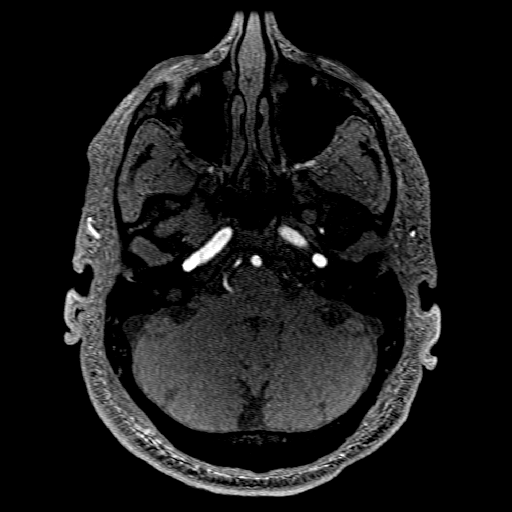
[im 108/241]
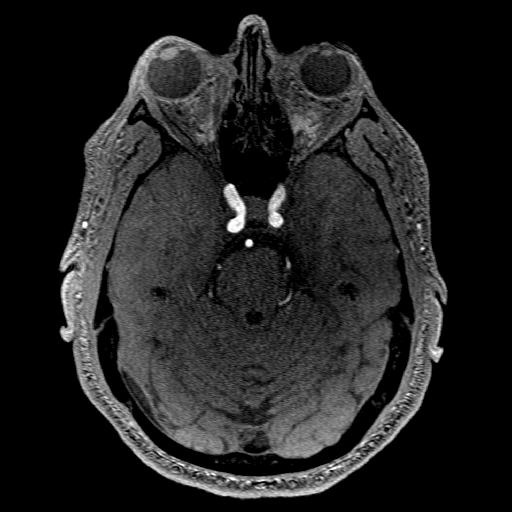
[im 123/241]
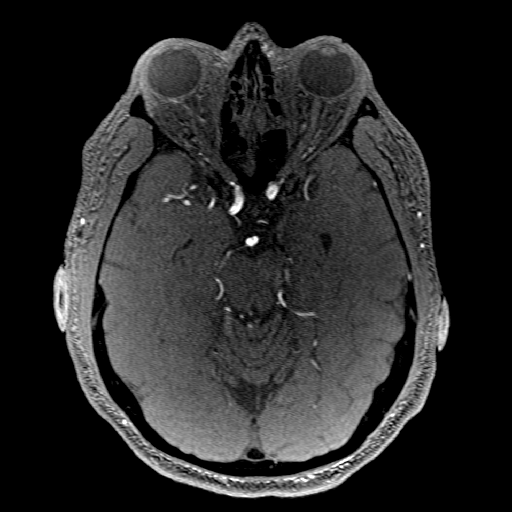
[im 138/241]
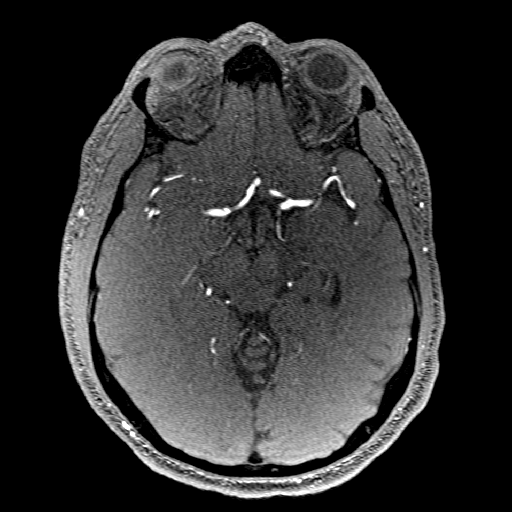
[im 169/241]
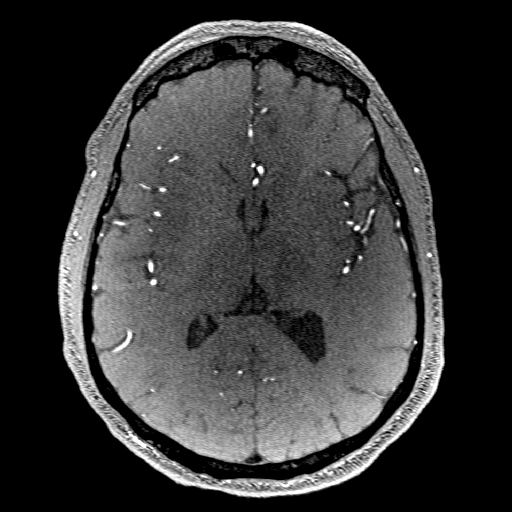
[im 200/241]
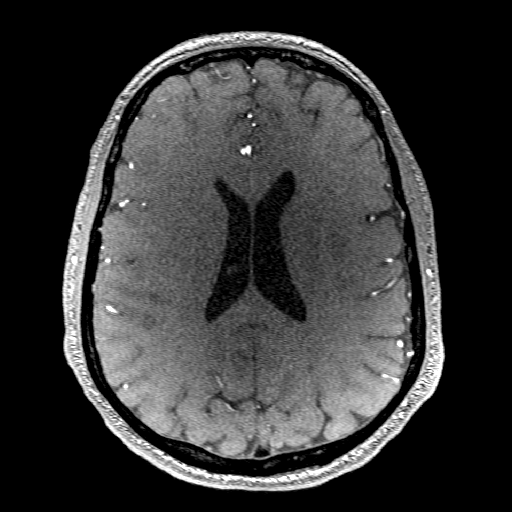
[im 205/241]
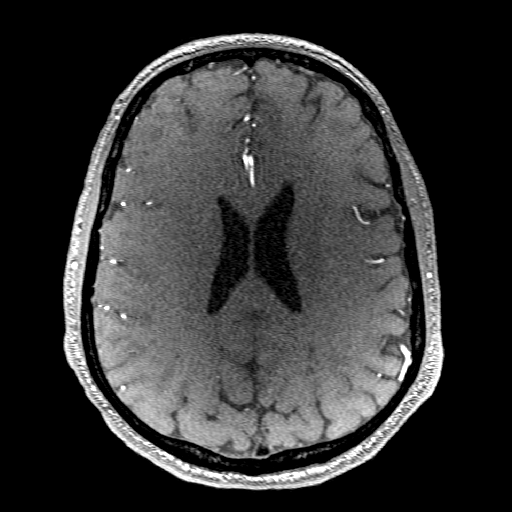
[im 230/241]
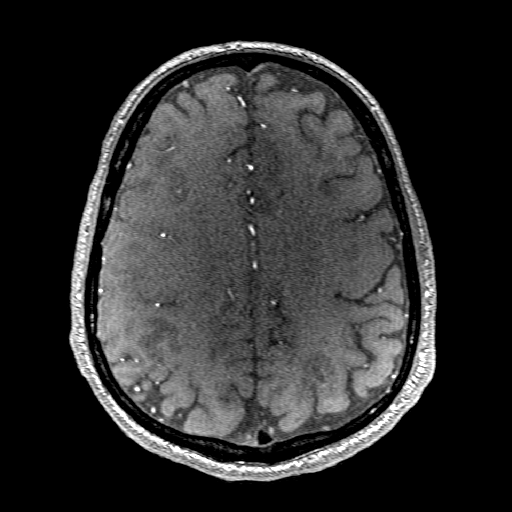

[19 of 48 positions shown; findings below may reference images not displayed]

FINDINGS: Anterior circulation: The internal carotid arteries are within
normal limits from high cervical segments through the ICA termini
bilaterally. The A1 and M1 segments are normal. MCA bifurcations are
within normal limits. MCA bifurcations are intact. The
revascularized superior right M2 segments remain patent. ACA and MCA
branch vessels are normal bilaterally.

Posterior circulation: The vertebral arteries are codominant. AICA
vessels are dominant. The basilar artery is within normal limits.
Right posterior cerebral artery originates from basilar tip. Left
posterior cerebral artery is of fetal type. A left P1 segment
contributes. PCA branch vessels are within normal limits
bilaterally.

Anatomic variants: None

Other: None.
IMPRESSION: 1. Normal variant MRA Circle of Willis without evidence for
significant proximal stenosis, aneurysm, or branch vessel occlusion.
2. The revascularized superior right M2 segments remain patent.

## 2021-12-26 IMAGING — MR MR HEAD W/O CM
6 of 11 series · 26 of 48 positions shown · non-contrast
Comparison: CTA head and neck, CT perfusion, and revascularization
images [DATE]

CLINICAL DATA: Status post revascularization of right superior M2
occlusion.

EXAM:
MRI HEAD WITHOUT CONTRAST
TECHNIQUE: Multiplanar, multiecho pulse sequences of the brain and surrounding
structures were obtained without intravenous contrast.

[Series 2: DWI · axial · 3.0mm · 0.94mm/px · z∈[-41,+114]mm · 8 of 106 slices shown (1 of 2)]
[im 1/106]
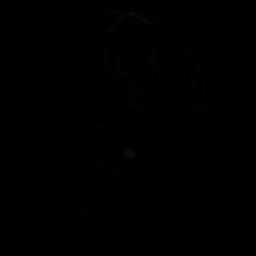
[im 12/106]
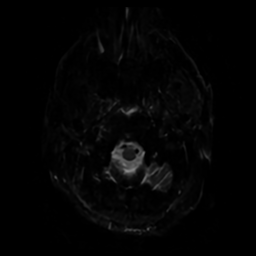
[im 36/106]
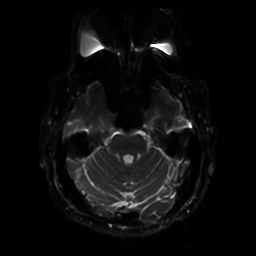
[im 47/106]
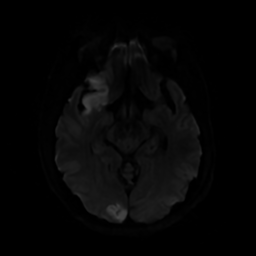
[im 59/106]
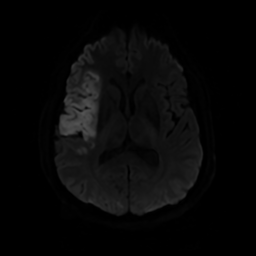
[im 71/106]
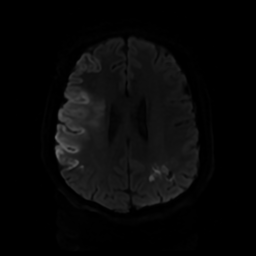
[im 94/106]
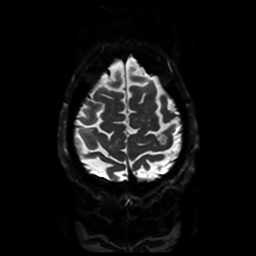
[im 106/106]
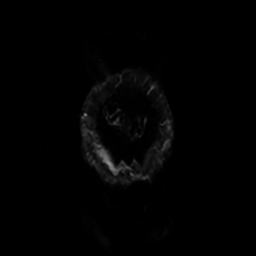

[Series 4: DWI · coronal · 4.0mm · 0.94mm/px · 6 of 73 slices shown (2 of 2)]
[im 1/73]
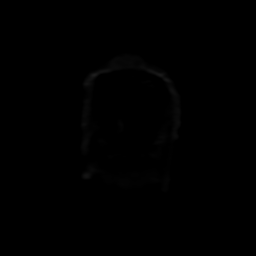
[im 15/73]
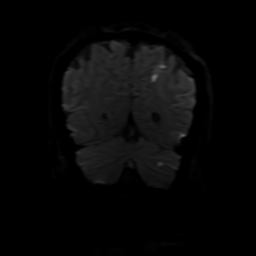
[im 29/73]
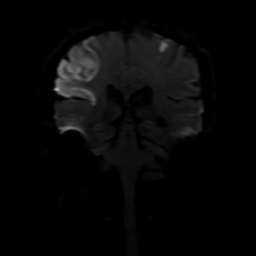
[im 44/73]
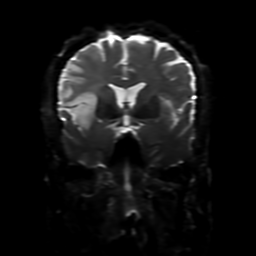
[im 58/73]
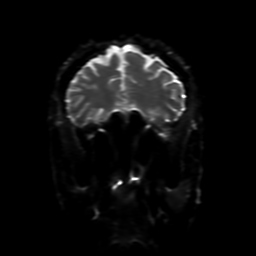
[im 73/73]
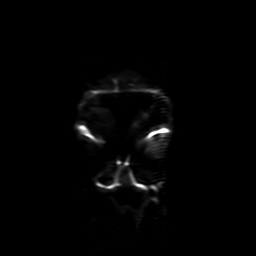

[Series 5: FLAIR · sagittal · 5.0mm · 0.23mm/px · 2 of 25 slices shown (1 of 2)]
[im 1/25]
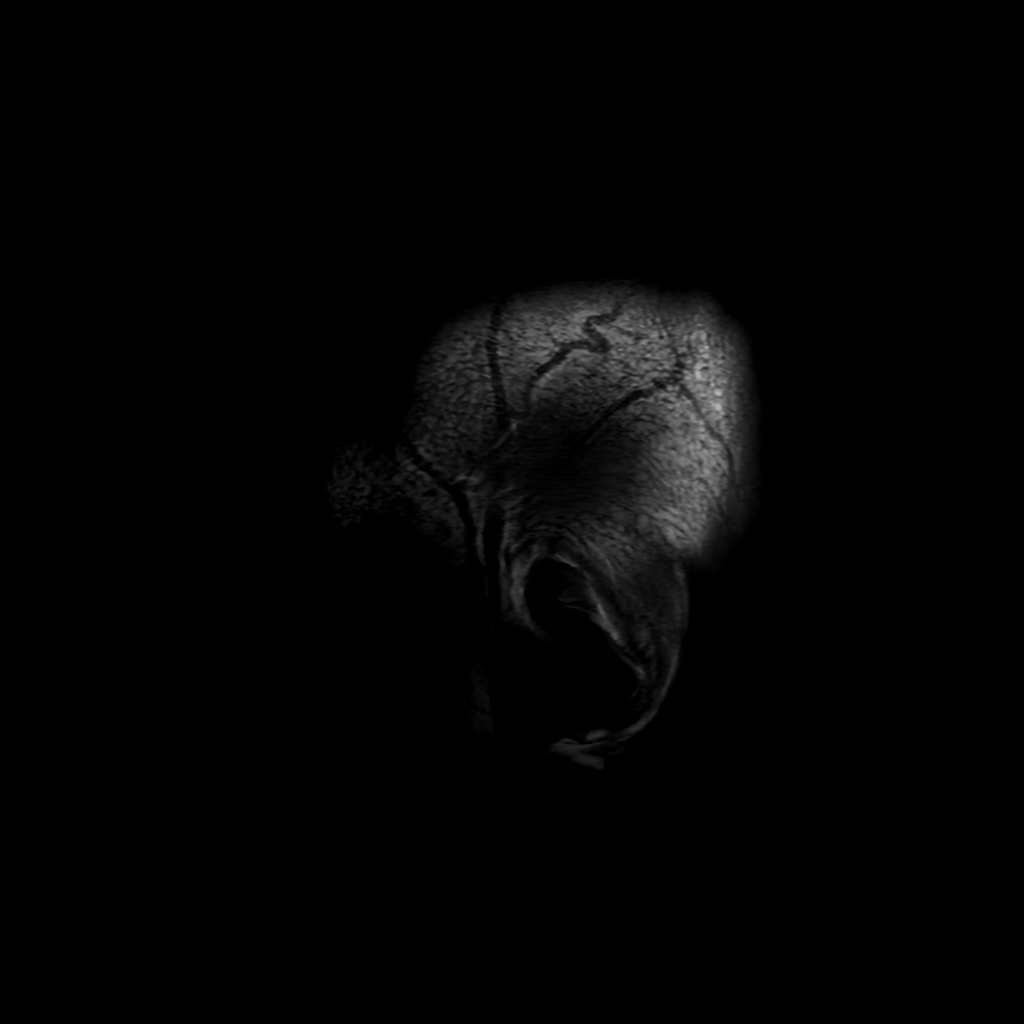
[im 25/25]
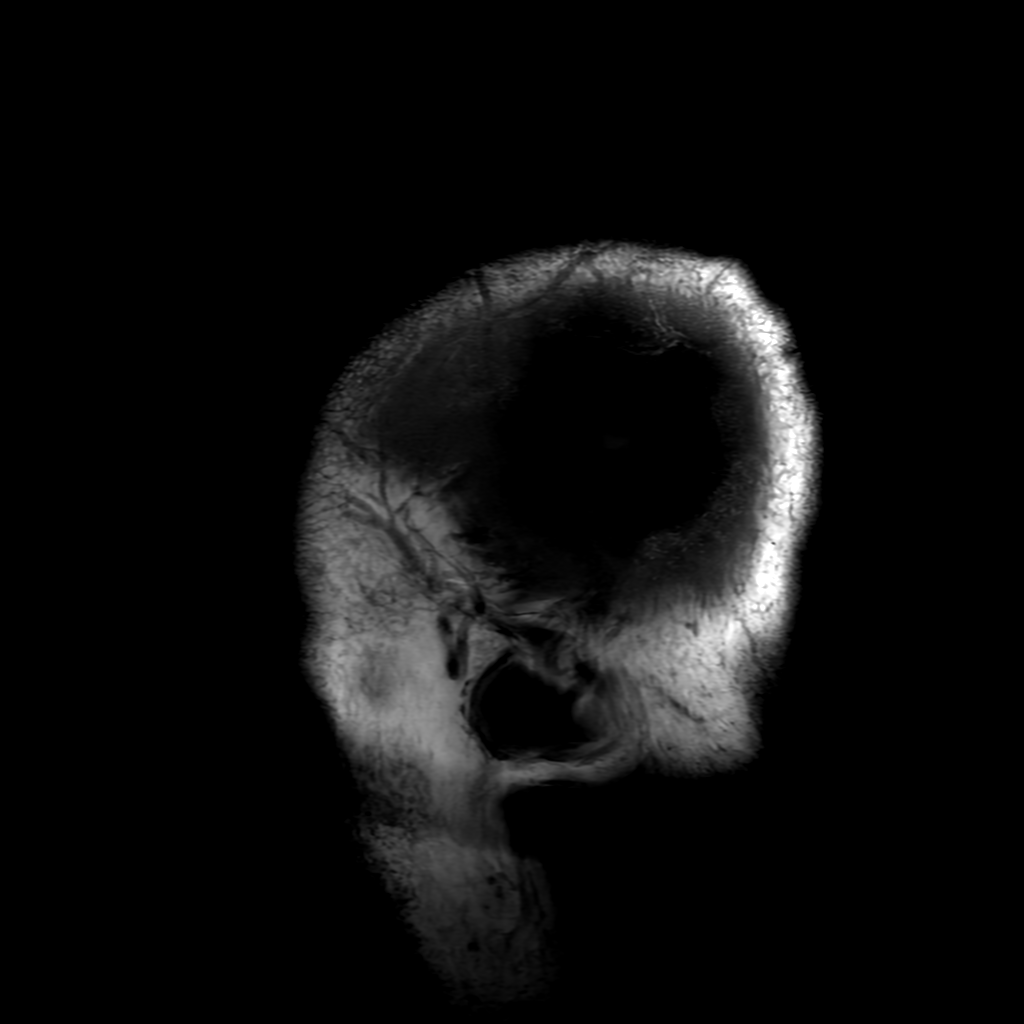

[Series 7: FLAIR · axial · 4.0mm · 0.45mm/px · z∈[-47,+116]mm · 3 of 35 slices shown (2 of 2)]
[im 1/35]
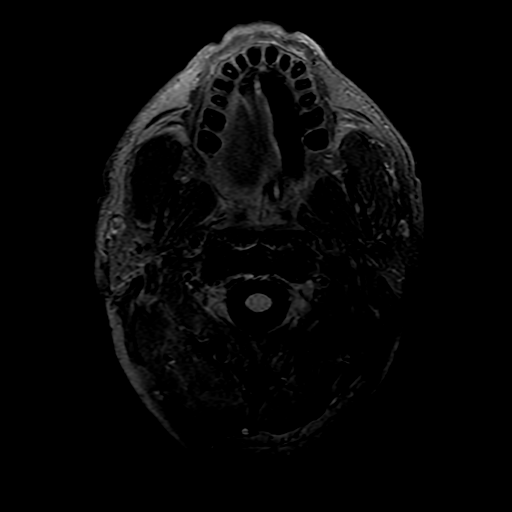
[im 18/35]
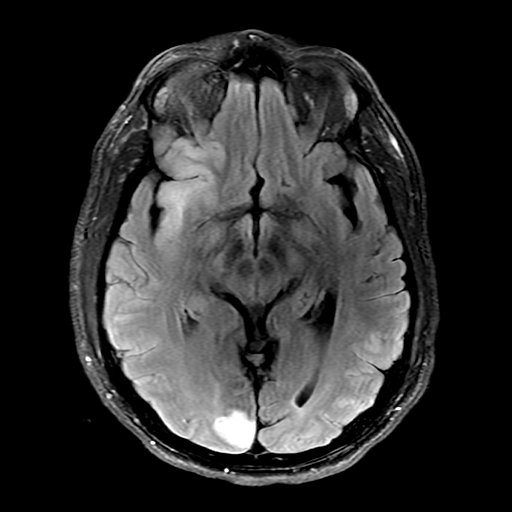
[im 35/35]
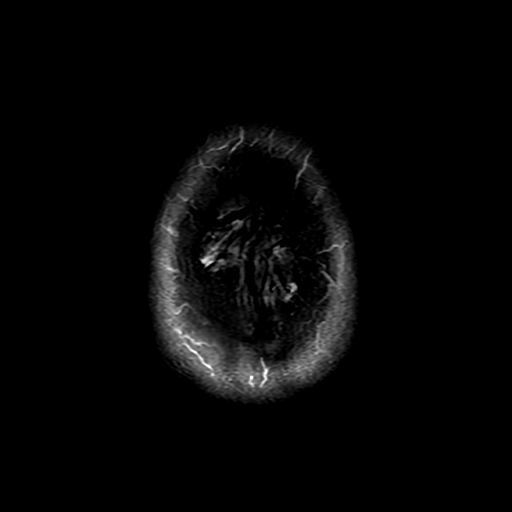

[Series 250: ADC · axial · 3.0mm · 0.94mm/px · z∈[-41,+114]mm · 4 of 50 slices shown (1 of 2)]
[im 1/50]
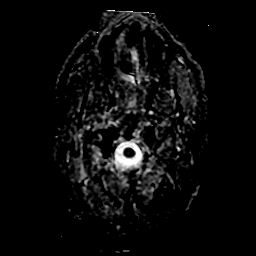
[im 17/50]
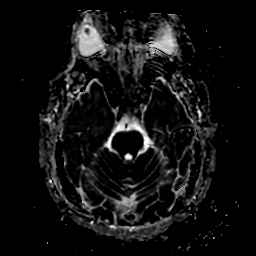
[im 33/50]
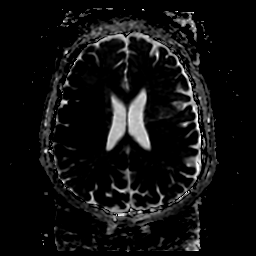
[im 50/50]
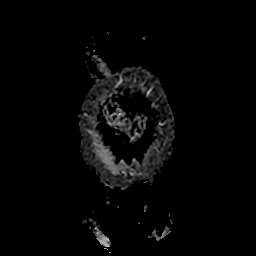

[Series 450: ADC · coronal · 4.0mm · 0.94mm/px · 3 of 37 slices shown (2 of 2)]
[im 1/37]
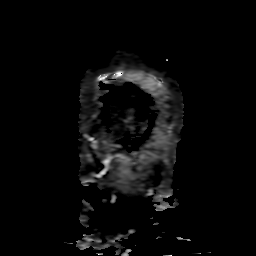
[im 19/37]
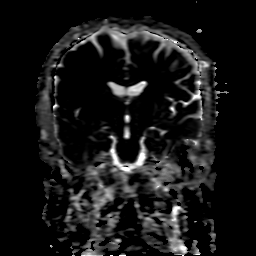
[im 37/37]
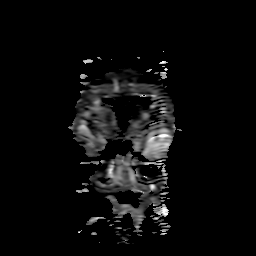

[26 of 48 positions shown; findings below may reference images not displayed]

FINDINGS: Brain: Diffusion-weighted images demonstrate acute hemorrhage within
the superior right MCA division involving the right insular cortex
and frontal lobe. Diffuse cortical infarct involves the high right
parietal cortex. Sparing of the more inferior parietal lobe and
superior temporal lobe noted.

Focal area of nonhemorrhagic infarct is present in the right
occipital pole. Contralateral left acute parietal and posterior left
frontal lobe cortical infarcts are present as well.

T2 hyperintensities are associated with the areas of acute/subacute
infarction. Focal susceptibility involving the left parietal infarct
noted. Additional areas of cortical hemorrhage are present in the
high posterior right parietal lobe and in the right frontal
operculum. Smaller foci of hemorrhage are present in the posterior
left frontal infarct.

Minimal white matter changes are present otherwise. The ventricles
are of normal size. No significant extraaxial fluid collection is
present.

The internal auditory canals are within normal limits. The brainstem
and cerebellum are within normal limits.

Vascular: Flow is present in the major intracranial arteries.

Skull and upper cervical spine: The craniocervical junction is
normal. Upper cervical spine is within normal limits. Marrow signal
is unremarkable.

Sinuses/Orbits: Bilateral mastoid effusions are present. There is
some fluid in the posterior nasopharynx. OG tube and endotracheal
tube are in place. The paranasal sinuses and mastoid air cells are
otherwise clear. The globes and orbits are within normal limits.
IMPRESSION: 1. Acute/subacute cortical infarct involving the superior right MCA
division involving the right insular cortex and frontal lobe spite
revascularization.
2. Contralateral acute/subacute cortical infarcts involving the left
parietal and posterior left frontal lobe.
3. Focal area of acute/subacute infarct in the right occipital pole.
4. Focal cortical hemorrhage within scattered infarcts involving the
left parietal and frontal lobes. Additional cortical hemorrhage in
the high posterior right parietal lobe and frontal operculum.
5. Bilateral mastoid effusions likely secondary to intubation.

## 2021-12-26 MED ORDER — INSULIN ASPART 100 UNIT/ML IJ SOLN
0.0000 [IU] | INTRAMUSCULAR | Status: DC
  Administered 2021-12-26 (×2): 3 [IU] via SUBCUTANEOUS
  Administered 2021-12-26: 2 [IU] via SUBCUTANEOUS
  Administered 2021-12-27 (×2): 3 [IU] via SUBCUTANEOUS
  Administered 2021-12-27 – 2021-12-28 (×4): 2 [IU] via SUBCUTANEOUS
  Administered 2021-12-28: 3 [IU] via SUBCUTANEOUS
  Administered 2021-12-29 – 2022-01-05 (×15): 2 [IU] via SUBCUTANEOUS
  Administered 2022-01-05 – 2022-01-06 (×2): 3 [IU] via SUBCUTANEOUS
  Administered 2022-01-06 – 2022-01-07 (×5): 2 [IU] via SUBCUTANEOUS
  Administered 2022-01-07: 3 [IU] via SUBCUTANEOUS
  Administered 2022-01-07 – 2022-01-08 (×3): 2 [IU] via SUBCUTANEOUS
  Administered 2022-01-08 (×3): 3 [IU] via SUBCUTANEOUS
  Administered 2022-01-08 – 2022-01-09 (×3): 2 [IU] via SUBCUTANEOUS
  Administered 2022-01-09: 3 [IU] via SUBCUTANEOUS
  Administered 2022-01-09 (×3): 2 [IU] via SUBCUTANEOUS
  Administered 2022-01-09 – 2022-01-10 (×4): 3 [IU] via SUBCUTANEOUS
  Administered 2022-01-10 (×2): 2 [IU] via SUBCUTANEOUS
  Administered 2022-01-10 – 2022-01-11 (×2): 3 [IU] via SUBCUTANEOUS
  Administered 2022-01-11 (×4): 2 [IU] via SUBCUTANEOUS
  Administered 2022-01-11: 3 [IU] via SUBCUTANEOUS
  Administered 2022-01-11 – 2022-01-12 (×2): 2 [IU] via SUBCUTANEOUS
  Administered 2022-01-12: 3 [IU] via SUBCUTANEOUS
  Administered 2022-01-12 (×3): 2 [IU] via SUBCUTANEOUS
  Administered 2022-01-13: 3 [IU] via SUBCUTANEOUS
  Administered 2022-01-13 (×2): 2 [IU] via SUBCUTANEOUS
  Administered 2022-01-13: 3 [IU] via SUBCUTANEOUS
  Administered 2022-01-13 – 2022-01-14 (×4): 2 [IU] via SUBCUTANEOUS
  Administered 2022-01-14: 3 [IU] via SUBCUTANEOUS
  Administered 2022-01-14 (×3): 2 [IU] via SUBCUTANEOUS
  Administered 2022-01-15 (×2): 3 [IU] via SUBCUTANEOUS
  Administered 2022-01-15: 2 [IU] via SUBCUTANEOUS

## 2021-12-26 MED ORDER — PERFLUTREN LIPID MICROSPHERE
1.0000 mL | INTRAVENOUS | Status: DC | PRN
  Administered 2021-12-26: 5 mL via INTRAVENOUS
  Filled 2021-12-26: qty 10

## 2021-12-26 MED ORDER — SODIUM CHLORIDE 0.9 % IV SOLN
3.0000 g | Freq: Four times a day (QID) | INTRAVENOUS | Status: DC
  Administered 2021-12-26 – 2021-12-28 (×9): 3 g via INTRAVENOUS
  Filled 2021-12-26 (×9): qty 8

## 2021-12-26 MED ORDER — IOHEXOL 350 MG/ML SOLN
100.0000 mL | Freq: Once | INTRAVENOUS | Status: AC | PRN
  Administered 2021-12-26: 100 mL via INTRAVENOUS

## 2021-12-26 NOTE — Progress Notes (Signed)
[facility] ROUNDING NOTE ? ? ?Subjective: ?Per nursing, the patient did not have any more significant bloody bowel movements late last night or this morning.  His hemoglobin responded well to the 3 units he was transfused.  He is still on 2 vasopressors, but appears more clinically stable than he was last night, with heart rates now in the 80s. ? ?The CTA last night was negative for active bleeding, but did show what appeared to be a small bowel obstruction.  The patient has had yellowish/green output through his OG tube, but prior to that, there is no history to suggest a bowel obstruction. ? ?The patient was able to interact well despite being intubated and was able to respond to questions with head nods/shakes and hand gestures.  He denied having any abdominal pain, or having any pain with palpation of his abdomen. ? ? ?Objective: ?Vital signs in last 24 hours: ?Temp:  [97.7 ?F (36.5 ?C)-101.5 ?F (38.6 ?C)] 99.9 ?F (37.7 ?C) (04/15 0900) ?Pulse Rate:  [75-152] 85 (04/15 0900) ?Resp:  [20-39] 30 (04/15 0900) ?BP: (54-147)/(32-105) 137/84 (04/15 0900) ?SpO2:  [93 %-100 %] 94 % (04/15 0900) ?Arterial Line BP: (66-146)/(44-94) 146/76 (04/15 0900) ?FiO2 (%):  [40 %-100 %] 40 % (04/15 0738) ?Weight:  [100.8 kg] 100.8 kg (04/14 1000) ?Last BM Date : 12/25/21 ?General: Intubated, ill-appearing, responds appropriately to questions with head nods/shakes and hand gestures ?Lungs: Coarse breath sounds bilaterally ?Heart:  RRR, no m/r/g ?Abdomen:  Soft, NT, ND, hypoactive bowel sounds ?Rectal: Maroon-colored stool in rectal vault ?Ext:  No c/c/e, purplish discoloration of toe noted ? ? ? ?Intake/Output from previous day: ?04/14 0701 - 04/15 0700 ?In: 6737.4 [I.V.:3103.7; Blood:1849.8; IV Piggyback:1783.8] ?Out: 2025 [id number]; Emesis/NG output:100] ?Intake/Output this shift: ?No intake/output data recorded. ? ? ?Lab Results: ?Recent Labs  ?  12/25/21 ?1048 12/25/21 ?1315 12/25/21 ?1526 12/25/21 ?2209  12/25/21 ?2319 12/26/21 ?0230  ?WBC 20.0* 20.6*  --   --   --  22.3*  ?HGB 11.8* 9.2*   < > 6.8* 9.5* 9.6*  ?PLT 199 205  --   --  187 202  ?MCV 91.1 91.5  --   --   --  85.5  ? < > = values in this interval not displayed.  ? ?BMET ?Recent Labs  ?  12/25/21 ?1923 12/25/21 ?2027 12/25/21 ?2053 12/25/21 ?2209 12/25/21 ?2319 12/26/21 ?0230  ?NA 139   < > 140 137  --  140  ?K 5.5*   < > 6.5* 5.7* 5.3* 4.9  ?CL 112*  --  113*  --   --  112*  ?CO2 21*  --  19*  --   --  20*  ?GLUCOSE 242*  --  241*  --   --  299*  ?BUN 36*  --  40*  --   --  40*  ?CREATININE 1.73*  --  1.98*  --   --  1.99*  ?CALCIUM 6.8*  --  6.7*  --   --  6.6*  ? < > = values in this interval not displayed.  ? ?LFT ?Recent Labs  ?  12/25/21 ?1048  ?PROT 5.7*  ?ALBUMIN 2.1*  ?AST 29  ?ALT 34  ?ALKPHOS 100  ?BILITOT 0.5  ? ?PT/INR ?Recent Labs  ?  12/25/21 ?1048 12/25/21 ?2319  ?INR 1.2 1.3*  ? ? ? ? ?Imaging/Other results: ?DG Abd 1 View ? ?Result Date: 12/25/2021 ?CLINICAL DATA:  Orogastric tube placement. EXAM: ABDOMEN - 1 VIEW COMPARISON:  None.  FINDINGS: An orogastric tube is seen with its distal tip overlying the expected region of the gastric antrum. The bowel gas pattern is normal. No radio-opaque calculi or other significant radiographic abnormality are seen. IMPRESSION: Orogastric tube positioning, as described above. Electronically Signed   By: Virgina Norfolk M.D.   On: 12/25/2021 19:09  ? ?DG Abd 1 View ? ?Result Date: 12/25/2021 ?CLINICAL DATA:  OG tube placement. EXAM: ABDOMEN - 1 VIEW COMPARISON:  One view chest x-ray 12:45 p.m. FINDINGS: Gaseous distention of the stomach noted. Bowel gas pattern otherwise unremarkable. OG tube is not visualized over the distal esophagus or stomach. IMPRESSION: OG tube is not visualized over the distal esophagus or stomach. Gaseous distention of the stomach. Electronically Signed   By: San Morelle M.D.   On: 12/25/2021 19:06  ? ?CT CEREBRAL PERFUSION W CONTRAST ? ?Result Date: 12/25/2021 ?CLINICAL  DATA:  Code stroke. 61 year old male with right MCA M2 ELVO. EXAM: CT PERFUSION BRAIN TECHNIQUE: Multiphase CT imaging of the brain was performed following IV bolus contrast injection. Subsequent parametric perfusion maps were calculated using RAPID software. RADIATION DOSE REDUCTION: This exam was performed according to the departmental dose-optimization program which includes automated exposure control, adjustment of the mA and/or kV according to patient size and/or use of iterative reconstruction technique. CONTRAST:  161mL OMNIPAQUE IOHEXOL 350 MG/ML SOLN COMPARISON:  CTA the same day. FINDINGS: CT Brain Perfusion Findings: CBF (<30%) Volume: 35mL. 10 mL of detected CBV changes in the right MCA middle sylvian division territory. Perfusion (Tmax>6.0s) volume: 31mL. Hypoperfusion index of 0.4, 30 mL of T-max greater than 10 seconds in the right MCA territory. Mismatch Volume: 27mL (mismatch ratio 1.8). ASPECTS on noncontrast CT Head: 10 at 1034 hours today. Infarction Location:Right MCA IMPRESSION: Right MCA territory ischemia. CT Perfusion estimates a core infarct of 40 mL, despite normal ASPECTS. 72 mL total estimated penumbra volume with hypoperfusion index of 0.4. Electronically Signed   By: Genevie Ann M.D.   On: 12/25/2021 11:09  ? ?DG CHEST PORT 1 VIEW ? ?Result Date: 12/25/2021 ?CLINICAL DATA:  Endotracheal tube placement, central line placement. EXAM: PORTABLE CHEST 1 VIEW COMPARISON:  Earlier today. FINDINGS: Endotracheal tube tip 4.3 cm from the carina at the level of the clavicular heads. New left subclavian central venous catheter tip overlies the upper SVC. No pneumothorax. Enteric tube in place with tip and side-port below the diaphragm not included in the field of view. Persistent low lung volumes. There are patchy right supra and infrahilar opacities. No large pleural effusion. IMPRESSION: 1. Left subclavian central venous line tip projects over the upper SVC. No pneumothorax. 2. Stable positioning of  endotracheal and enteric tubes. 3. Persistent low lung volumes with patchy right supra and infrahilar opacities. Electronically Signed   By: Keith Rake M.D.   On: 12/25/2021 21:54  ? ?Portable Chest x-ray ? ?Result Date: 12/25/2021 ?CLINICAL DATA:  Intubation and OG tube placement. EXAM: PORTABLE CHEST 1 VIEW COMPARISON:  One-view chest x-ray 12/25/2021 FINDINGS: Heart is enlarged. Pulmonary vascular congestion noted peer airspace opacities noted over the lower lobes bilaterally. Patient has been intubated. Endotracheal tube terminates 4 cm above the carina. OG tube is coiled in the cervical esophagus. IMPRESSION: 1. Cardiomegaly and pulmonary vascular congestion concerning for congestive heart failure. 2. Bibasilar airspace disease likely reflects atelectasis. Infection is not excluded. 3. Satisfactory positioning of endotracheal tube. 4. OG tube is coiled in the cervical esophagus. These results will be called to the ordering clinician or representative by the  Psychologist, clinical, and communication documented in the PACS or [employer]. Electronically Signed   By: San Morelle M.D.   On: 12/25/2021 19:05  ? ?DG Chest Port 1 View ? ?Result Date: 12/25/2021 ?CLINICAL DATA:  Evaluate for pneumonia. EXAM: PORTABLE CHEST 1 VIEW COMPARISON:  12/22/2021 FINDINGS: Stable cardiomediastinal contours. Lung volumes are decreased and there is asymmetric elevation of the right hemidiaphragm. Diffuse pulmonary vascular congestion. No pleural effusion or edema. No airspace densities. IMPRESSION: 1. Low lung volumes with asymmetric elevation of right hemidiaphragm. 2. Pulmonary vascular congestion. Electronically Signed   By: Kerby Moors M.D.   On: 12/25/2021 13:05  ? ?CT Angio Chest/Abd/Pel for Dissection W and/or W/WO ? ?Result Date: 12/26/2021 ?CLINICAL DATA:  Evaluation for GI bleed. EXAM: CT ANGIOGRAPHY CHEST, ABDOMEN AND PELVIS TECHNIQUE: Non-contrast CT of the chest was initially obtained. Multidetector  CT imaging through the chest, abdomen and pelvis was performed using the standard protocol during bolus administration of intravenous contrast. Multiplanar reconstructed images and MIPs were obtained and rev

## 2021-12-26 NOTE — Progress Notes (Signed)
PT Cancellation Note ? ?Patient Details ?Name: Vincent Ferrell ?MRN: [id number] ?DOB: [birth year] ? ? ?Cancelled Treatment:    Reason Eval/Treat Not Completed: Active bedrest order (post sheath removal). Will follow as activity orders are advanced.  ? ?Lyanne Co, PT  ?Acute Rehab Services ? Pager [phone] ?Office [phone] ? ? ? ?Jazae Gandolfi L Siya Flurry ?12/26/2021, 8:04 AM ?

## 2021-12-26 NOTE — Plan of Care (Signed)
Care plan initiated.

## 2021-12-26 NOTE — Progress Notes (Signed)
Echo attempted. Patient not in room at time of attempt. Will attempt again later as schedule permits. ?

## 2021-12-26 NOTE — Progress Notes (Signed)
Pharmacy Antibiotic Note ? ?Vincent Ferrell is a 61 y.o. 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  Pharmacy has been consulted for vancomycin dosing, also changing to Unasyn. ? ?Tm 101.5, WBC up 22.3, PCT 2.95. AKI, SCr 1.99 (baseline ~1.2s). Cultures pending. ? ?Plan: ?Unasyn 3 g IV q6h ?Vancomycin 1000mg  IV q24h. Goal AUC 400-550. ?Expected AUC: 462, SCr used: 1.83, Vd coeff 0.5 ?Will f/u renal function, micro data, and pt's clinical condition ?Vanc levels prn ?Consider d/c vancomycin since MRSA PCR neg ? ?Height: 5' 11.5" (181.6 cm) ?Weight: 100.8 kg (222 lb 3.6 oz) ?IBW/kg (Calculated) : 76.45 ? ?Temp (24hrs), Avg:100.6 ?F (38.1 ?C), Min:97.7 ?F (36.5 ?C), Max:101.5 ?F (38.6 ?C) ? ?Recent Labs  ?Lab 12/25/21 ?1032 12/25/21 ?1048 12/25/21 ?1315 12/25/21 ?1520 12/25/21 ?1923 12/25/21 ?2053 12/26/21 ?0230  ?WBC  --  20.0* 20.6*  --   --   --  22.3*  ?CREATININE 1.90* 1.83*  --   --  1.73* 1.98* 1.99*  ?LATICACIDVEN  --   --  1.5 1.5  --  3.0*  --   ? ?  ?Estimated Creatinine Clearance: 48.1 mL/min (A) (by C-G formula based on SCr of 1.99 mg/dL (H)).   ? ?Not on File ? ?Antimicrobials this admission: ?Vancomycin 4/14 >>  ?Cefazolin 4/14 x1 ?Rocephin 4/14 x1 ?Azithromycin 4/14 x1 ?Unasyn 4/15 >> ? ?Microbiology results: ?4/14 BCx: ngtd ?4/14 Trach asp: ?4/14 MRSA PCR: neg ? ?Thank you for involving pharmacy in this patient's care. ? ?Renold Genta, PharmD, BCPS ?Clinical Pharmacist ?Clinical phone for 12/26/2021 until 3p is [phone] ?12/26/2021 10:19 AM ? ?**Pharmacist phone directory can be found on [email or web address] listed under [place]** ? ? ? ?

## 2021-12-26 NOTE — Plan of Care (Signed)
Interdisciplinary Goals of Care Family Meeting ? ? ?Date carried out:: 12/26/2021 ? ?Location of the meeting: Bedside ? ?Member's involved: Physician, Bedside Registered Nurse, and Family Member or next of kin ? ?Durable Power of Attorney or acting medical decision maker: Materials engineer (patient's sister) ? ?Discussion: We discussed goals of care for [facility] .   ? ?The Clinical status was relayed to patient's sister at bedside in detail. ?  ?Updated and notified of patients medical condition. ?Patient remains unresponsive and will not open eyes to command.   ?Patient is having a weak cough and struggling to remove secretions.   ?Patient with increased WOB and using accessory muscles to breathe ?Explained to family course of therapy and the modalities  ?Signs of brain damage ?Evidence of kidney failure ?Recommend follow up NEUROLOGY RECS ?  ?Patient with Progressive multiorgan failure and septic shock is high risk of complications and risk of death despite all aggressive and optimal medical therapy. ? ?Code status: Full Code ? ?Disposition: Continue current acute care ? ?  ?Family are satisfied with Plan of action and management. All questions answered ?  ?Cheri Fowler MD ?[facility] ?See Amion for pager ?If no response to pager, please call [phone] until 7pm ?After 7pm, Please call E-link [phone] ? ?

## 2021-12-26 NOTE — Progress Notes (Signed)
OT Cancellation Note ? ?Patient Details ?Name: Vincent Ferrell ?MRN: [id number] ?DOB: 21-Jul-1961 ? ? ?Cancelled Treatment:    Reason Eval/Treat Not Completed: Active bedrest order.  OT to continue efforts as appropriate.   ? ?Blaike Newburn D Jessah Danser ?12/26/2021, 1:16 PM ?

## 2021-12-26 NOTE — Progress Notes (Addendum)
Notified CCM MD, Dr. Marlane Mingle of low BP at 1945.  While on the phone with the MD, RN noticed large amount of bloody clots from pts rectum.  Levophed ordered and started.  Paged GI MD and spoke with Dr. Leonides Schanz on the phone.  RN gave Dr. Leonides Schanz Dr. Madilyn Fireman number so they could discuss the pt's needs.  Notified Dr. Marlane Mingle again when pt's BP did not improve while being maxed on Levophed.  CCM ground team came to bedside to assess pt and place central line.  3 units PRBCs, 1 unit of FFP, and 1 unit of platelets ordered and started.  Vasopressin ordered and hung.  Increased Levophed.  Pt's BP began to increase.  Dr. Leonides Schanz came to bedside to assess pt.  RN discussed need for CTA chest/abd/pelvis with Dr. Leonides Schanz.  RN will take pt to CT once vitals are stable.       ?

## 2021-12-26 NOTE — Progress Notes (Signed)
SLP Cancellation Note ? ?Patient Details ?Name: Vincent Ferrell ?MRN: [id number] ?DOB: 1960/11/01 ? ? ?Cancelled treatment:       Reason Eval/Treat Not Completed: Other (Pt on ventilator. Will f/u for completion of speech-language eval when pt is medically ready). ? ? ? ?Avie Echevaria, MA, CCC-SLP ?Acute Rehabilitation Services ?Office Number: [phone] ? ?Paulette Blanch ?12/26/2021, 8:26 AM ?

## 2021-12-26 NOTE — Progress Notes (Signed)
? ? ?Referring Physician(s): ?Dr Jerel Shepherd ? ?Supervising Physician: Julieanne Cotton ? ?Patient Status:  [facility] - In-pt ? ?Chief Complaint: ? 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 ?Subjective: ? 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 ? ?Intubated ?Moving right well ?Some movement of left leg ?Opens eyes to voice ?Follows most commands ? ?Will need MRI today ?Dr Corliss Skains spoke with family at bedside ? ?Allergies: ?Patient has no allergy information on record. ? ?Medications: ?Prior to Admission medications   ?Medication Sig Start Date End Date Taking? Authorizing Provider  ?benzonatate (TESSALON) 100 MG capsule Take 100 mg by mouth 3 (three) times daily as needed for cough. 12/22/21   [provider]  ?doxycycline (VIBRAMYCIN) 100 MG capsule Take 100 mg by mouth 2 (two) times daily. 12/22/21   [provider]  ?lisinopril (ZESTRIL) 5 MG tablet Take 5 mg by mouth daily. 12/03/21   [provider]  ?pantoprazole (PROTONIX) 20 MG tablet Take 20 mg by mouth daily. 12/03/21   [provider]  ?simvastatin (ZOCOR) 40 MG tablet Take 40 mg by mouth at bedtime. 12/03/21   [provider]  ? ? ? ?Vital Signs: ?BP 107/68   Pulse 89   Temp 100.2 ?F (37.9 ?C)   Resp (!) 30   Ht 5' 11.5" (1.816 m)   Wt 222 lb 3.6 oz (100.8 kg)   SpO2 95%   BMI 30.56 kg/m?  ? ?Physical Exam ?Vitals reviewed.  ?Constitutional:   ?   Comments: Intubated   ?Pulmonary:  ?   Comments: vent ?Musculoskeletal:  ?   Comments: Moving Rt well and to command ?Moving left minimally-- leg better than arm ?  ?Skin: ?   Comments: Rt groin is clean and dry ?NT ?No bleeding ?No hematoma ?Good Rt foot pulse ?   ? ? ?Imaging: ?DG Abd 1 View ? ?Result Date: 12/25/2021 ?CLINICAL DATA:  Orogastric tube placement. EXAM: ABDOMEN - 1 VIEW COMPARISON:  None. FINDINGS: An orogastric tube is seen with its distal tip overlying the expected region of the gastric antrum. The bowel gas pattern is normal. No radio-opaque calculi or other significant radiographic abnormality are seen. IMPRESSION: Orogastric tube positioning, as described above. Electronically Signed   By: Aram Candela M.D.   On: 12/25/2021 19:09  ? ?DG Abd 1 View ? ?Result Date: 12/25/2021 ?CLINICAL DATA:  OG tube placement. EXAM: ABDOMEN - 1 VIEW COMPARISON:  One view chest x-ray 12:45 p.m. FINDINGS: Gaseous distention of the stomach noted. Bowel gas pattern otherwise unremarkable. OG tube is not visualized over the distal esophagus or stomach. IMPRESSION: OG tube is not visualized over the distal esophagus or stomach. Gaseous distention of the stomach. Electronically Signed   By: Marin Roberts M.D.   On: 12/25/2021 19:06  ? ?CT CEREBRAL PERFUSION W CONTRAST ? ?Result Date: 12/25/2021 ?CLINICAL DATA:  Code stroke. 61 year old male with right MCA M2 ELVO. EXAM: CT PERFUSION BRAIN TECHNIQUE: Multiphase CT imaging of the brain was performed following IV bolus contrast injection. Subsequent parametric perfusion maps were calculated using RAPID software. RADIATION DOSE REDUCTION: This exam was performed according to the departmental dose-optimization program which includes automated exposure control, adjustment of the mA and/or kV according to patient size and/or use of  iterative reconstruction technique. CONTRAST:  OMNIPAQUE IOHEXOL 350 MG/ML SOLN COMPARISON:  CTA the same day. FINDINGS: CT Brain Perfusion Findings: CBF (<30%) Volume: 15mL. 10 mL of detected CBV changes in the right MCA middle sylvian division territory. Perfusion (Tmax>6.0s) volume: 61mL. Hypoperfusion index of 0.4, 30 mL of T-max greater than 10 seconds in the right MCA territory. Mismatch  Volume: 25mL (mismatch ratio 1.8). ASPECTS on noncontrast CT Head: 10 at 1034 hours today. Infarction Location:Right MCA IMPRESSION: Right MCA territory ischemia. CT Perfusion estimates a core infarct of 40 mL, despite normal ASPECTS. 72 mL total estimated penumbra volume with hypoperfusion index of 0.4. Electronically Signed   By: Odessa Fleming M.D.   On: 12/25/2021 11:09  ? ?DG CHEST PORT 1 VIEW ? ?Result Date: 12/25/2021 ?CLINICAL DATA:  Endotracheal tube placement, central line placement. EXAM: PORTABLE CHEST 1 VIEW COMPARISON:  Earlier today. FINDINGS: Endotracheal tube tip 4.3 cm from the carina at the level of the clavicular heads. New left subclavian central venous catheter tip overlies the upper SVC. No pneumothorax. Enteric tube in place with tip and side-port below the diaphragm not included in the field of view. Persistent low lung volumes. There are patchy right supra and infrahilar opacities. No large pleural effusion. IMPRESSION: 1. Left subclavian central venous line tip projects over the upper SVC. No pneumothorax. 2. Stable positioning of endotracheal and enteric tubes. 3. Persistent low lung volumes with patchy right supra and infrahilar opacities. Electronically Signed   By: Narda Rutherford M.D.   On: 12/25/2021 21:54  ? ?Portable Chest x-ray ? ?Result Date: 12/25/2021 ?CLINICAL DATA:  Intubation and OG tube placement. EXAM: PORTABLE CHEST 1 VIEW COMPARISON:  One-view chest x-ray 12/25/2021 FINDINGS: Heart is enlarged. Pulmonary vascular congestion noted peer airspace opacities noted over the lower lobes bilaterally. Patient has been intubated. Endotracheal tube terminates 4 cm above the carina. OG tube is coiled in the cervical esophagus. IMPRESSION: 1. Cardiomegaly and pulmonary vascular congestion concerning for congestive heart failure. 2. Bibasilar airspace disease likely reflects atelectasis. Infection is not excluded. 3. Satisfactory positioning of endotracheal tube. 4. OG tube is coiled in the  cervical esophagus. These results will be called to the ordering clinician or representative by the Radiologist Assistant, and communication documented in the PACS or [employer]. Electronically Signed   By: Marin Roberts M.D.   On: 12/25/2021 19:05  ? ?DG Chest Port 1 View ? ?Result Date: 12/25/2021 ?CLINICAL DATA:  Evaluate for pneumonia. EXAM: PORTABLE CHEST 1 VIEW COMPARISON:  12/22/2021 FINDINGS: Stable cardiomediastinal contours. Lung volumes are decreased and there is asymmetric elevation of the right hemidiaphragm. Diffuse pulmonary vascular congestion. No pleural effusion or edema. No airspace densities. IMPRESSION: 1. Low lung volumes with asymmetric elevation of right hemidiaphragm. 2. Pulmonary vascular congestion. Electronically Signed   By: Signa Kell M.D.   On: 12/25/2021 13:05  ? ?CT Angio Chest/Abd/Pel for Dissection W and/or W/WO ? ?Result Date: 12/26/2021 ?CLINICAL DATA:  Evaluation for GI bleed. EXAM: CT ANGIOGRAPHY CHEST, ABDOMEN AND PELVIS TECHNIQUE: Non-contrast CT of the chest was initially obtained. Multidetector CT imaging through the chest, abdomen and pelvis was performed using the standard protocol during bolus administration of intravenous contrast. Multiplanar reconstructed images and MIPs were obtained and reviewed to evaluate the vascular anatomy. RADIATION DOSE REDUCTION: This exam was performed according to the departmental dose-optimization program which includes automated exposure control, adjustment of the mA and/or kV according to patient size and/or use of iterative reconstruction technique. CONTRAST:  OMNIPAQUE IOHEXOL  350 MG/ML SOLN COMPARISON:  None. FINDINGS: CTA CHEST FINDINGS Cardiovascular: There is moderate to marked severity calcification of the aortic arch, without evidence of aortic aneurysm or dissection. Satisfactory opacification of the pulmonary arteries to the segmental level. No evidence of pulmonary embolism. There is mild cardiomegaly. A  trace amount of pericardial effusion is noted. Mediastinum/Nodes: Endotracheal and nasogastric tubes are in place. No enlarged mediastinal, hilar, or axillary lymph nodes. Thyroid gland, trachea, and esophagus demonstrate no

## 2021-12-26 NOTE — Progress Notes (Signed)
STROKE TEAM PROGRESS NOTE  ? ?SUBJECTIVE (INTERVAL HISTORY) ?His girlfriend and 2 other family members are at the bedside.  Patient still intubated, eyes closed, however able to follow simple commands.  Patient still has low-grade fever but tachycardia improved, still concerning for bacteremia and endocarditis.  Blood culture pending.  On antibiotics.  Overnight had GI bleeding with dropping hemoglobin, status post blood transfusion. ? ?OBJECTIVE ?Temp:  [97.7 ?F (36.5 ?C)-101.5 ?F (38.6 ?C)] 100.2 ?F (37.9 ?C) (04/15 1200) ?Pulse Rate:  [75-152] 83 (04/15 1200) ?Cardiac Rhythm: Normal sinus rhythm (04/15 0800) ?Resp:  [20-39] 30 (04/15 1200) ?BP: (54-147)/(32-105) 103/65 (04/15 1200) ?SpO2:  [93 %-100 %] 95 % (04/15 1200) ?Arterial Line BP: (66-146)/(44-94) 103/57 (04/15 1200) ?FiO2 (%):  [40 %-100 %] 40 % (04/15 1117) ? ?Recent Labs  ?Lab 12/25/21 ?1957 12/25/21 ?2330 12/26/21 ?0418 12/26/21 ?[id number] 12/26/21 ?1137  ?GLUCAP 250* 290* 276* 257* 197*  ? ?Recent Labs  ?Lab 12/25/21 ?1032 12/25/21 ?1032 12/25/21 ?1048 12/25/21 ?1923 12/25/21 ?2027 12/25/21 ?2053 12/25/21 ?2209 12/25/21 ?2319 12/26/21 ?0230  ?NA 139  --  138 139 137 140 137  --  140  ?K 4.8  --  4.9 5.5* 6.1* 6.5* 5.7* 5.3* 4.9  ?CL 108  --  108 112*  --  113*  --   --  112*  ?CO2  --   --  21* 21*  --  19*  --   --  20*  ?GLUCOSE 139*  --  138* 242*  --  241*  --   --  299*  ?BUN 34*  --  34* 36*  --  40*  --   --  40*  ?CREATININE 1.90*  --  1.83* 1.73*  --  1.98*  --   --  1.99*  ?CALCIUM  --    < > 7.9* 6.8*  --  6.7*  --   --  6.6*  ? < > = values in this interval not displayed.  ? ?Recent Labs  ?Lab 12/25/21 ?1048  ?AST 29  ?ALT 34  ?ALKPHOS 100  ?BILITOT 0.5  ?PROT 5.7*  ?ALBUMIN 2.1*  ? ?Recent Labs  ?Lab 12/25/21 ?1048 12/25/21 ?1315 12/25/21 ?1526 12/25/21 ?1928 12/25/21 ?2027 12/25/21 ?2209 12/25/21 ?2319 12/26/21 ?0230  ?WBC 20.0* 20.6*  --   --   --   --   --  22.3*  ?NEUTROABS 16.3*  --   --   --   --   --   --  17.1*  ?HGB 11.8* 9.2*   < >  7.0* 6.5* 6.8* 9.5* 9.6*  ?HCT 35.0* 26.8*   < > 21.1* 19.0* 20.0* 27.3* 27.2*  ?MCV 91.1 91.5  --   --   --   --   --  85.5  ?PLT 199 205  --   --   --   --  187 202  ? < > = values in this interval not displayed.  ? ?No results for input(s): CKTOTAL, CKMB, CKMBINDEX, TROPONINI in the last 168 hours. ?Recent Labs  ?  12/25/21 ?1048 12/25/21 ?2319  ?LABPROT 14.9 15.9*  ?INR 1.2 1.3*  ? ?Recent Labs  ?  12/25/21 ?1127  ?COLORURINE YELLOW  ?LABSPEC 1.034*  ?PHURINE 5.0  ?GLUCOSEU NEGATIVE  ?HGBUR LARGE*  ?BILIRUBINUR NEGATIVE  ?KETONESUR NEGATIVE  ?PROTEINUR NEGATIVE  ?NITRITE NEGATIVE  ?LEUKOCYTESUR NEGATIVE  ?  ?   ?Component Value Date/Time  ? CHOL 89 12/26/2021 0230  ? TRIG 214 (H) 12/26/2021 0230  ? HDL <  10 (L) 12/26/2021 0230  ? CHOLHDL NOT CALCULATED 12/26/2021 0230  ? VLDL 43 (H) 12/26/2021 0230  ? LDLCALC NOT CALCULATED 12/26/2021 0230  ? ?Lab Results  ?Component Value Date  ? HGBA1C 6.0 (H) 12/25/2021  ? ?   ?Component Value Date/Time  ? LABOPIA NONE DETECTED 12/26/2021 0116  ? COCAINSCRNUR NONE DETECTED 12/26/2021 0116  ? LABBENZ NONE DETECTED 12/26/2021 0116  ? AMPHETMU NONE DETECTED 12/26/2021 0116  ? THCU NONE DETECTED 12/26/2021 0116  ? LABBARB NONE DETECTED 12/26/2021 0116  ?  ?No results for input(s): ETH in the last 168 hours. ? ?I have personally reviewed the radiological images below and agree with the radiology interpretations. ? ?DG Abd 1 View ? ?Result Date: 12/25/2021 ?CLINICAL DATA:  Orogastric tube placement. EXAM: ABDOMEN - 1 VIEW COMPARISON:  None. FINDINGS: An orogastric tube is seen with its distal tip overlying the expected region of the gastric antrum. The bowel gas pattern is normal. No radio-opaque calculi or other significant radiographic abnormality are seen. IMPRESSION: Orogastric tube positioning, as described above. Electronically Signed   By: Aram Candela M.D.   On: 12/25/2021 19:09  ? ?DG Abd 1 View ? ?Result Date: 12/25/2021 ?CLINICAL DATA:  OG tube placement. EXAM: ABDOMEN  - 1 VIEW COMPARISON:  One view chest x-ray 12:45 p.m. FINDINGS: Gaseous distention of the stomach noted. Bowel gas pattern otherwise unremarkable. OG tube is not visualized over the distal esophagus or stomach. IMPRESSION: OG tube is not visualized over the distal esophagus or stomach. Gaseous distention of the stomach. Electronically Signed   By: Marin Roberts M.D.   On: 12/25/2021 19:06  ? ?CT CEREBRAL PERFUSION W CONTRAST ? ?Result Date: 12/25/2021 ?CLINICAL DATA:  Code stroke. 61 year old male with right MCA M2 ELVO. EXAM: CT PERFUSION BRAIN TECHNIQUE: Multiphase CT imaging of the brain was performed following IV bolus contrast injection. Subsequent parametric perfusion maps were calculated using RAPID software. RADIATION DOSE REDUCTION: This exam was performed according to the departmental dose-optimization program which includes automated exposure control, adjustment of the mA and/or kV according to patient size and/or use of iterative reconstruction technique. CONTRAST:  OMNIPAQUE IOHEXOL 350 MG/ML SOLN COMPARISON:  CTA the same day. FINDINGS: CT Brain Perfusion Findings: CBF (<30%) Volume: 72mL. 10 mL of detected CBV changes in the right MCA middle [place] territory. Perfusion (Tmax>6.0s) volume: 3mL. Hypoperfusion index of 0.4, 30 mL of T-max greater than 10 seconds in the right MCA territory. Mismatch Volume: 61mL (mismatch ratio 1.8). ASPECTS on noncontrast CT Head: 10 at 1034 hours today. Infarction Location:Right MCA IMPRESSION: Right MCA territory ischemia. CT Perfusion estimates a core infarct of 40 mL, despite normal ASPECTS. 72 mL total estimated penumbra volume with hypoperfusion index of 0.4. Electronically Signed   By: Odessa Fleming M.D.   On: 12/25/2021 11:09  ? ?DG CHEST PORT 1 VIEW ? ?Result Date: 12/25/2021 ?CLINICAL DATA:  Endotracheal tube placement, central line placement. EXAM: PORTABLE CHEST 1 VIEW COMPARISON:  Earlier today. FINDINGS: Endotracheal tube tip 4.3 cm from the  carina at the level of the clavicular heads. New left subclavian central venous catheter tip overlies the upper SVC. No pneumothorax. Enteric tube in place with tip and side-port below the diaphragm not included in the field of view. Persistent low lung volumes. There are patchy right supra and infrahilar opacities. No large pleural effusion. IMPRESSION: 1. Left subclavian central venous line tip projects over the upper SVC. No pneumothorax. 2. Stable positioning of endotracheal and enteric tubes. 3. Persistent  low lung volumes with patchy right supra and infrahilar opacities. Electronically Signed   By: Narda Rutherford M.D.   On: 12/25/2021 21:54  ? ?Portable Chest x-ray ? ?Result Date: 12/25/2021 ?CLINICAL DATA:  Intubation and OG tube placement. EXAM: PORTABLE CHEST 1 VIEW COMPARISON:  One-view chest x-ray 12/25/2021 FINDINGS: Heart is enlarged. Pulmonary vascular congestion noted peer airspace opacities noted over the lower lobes bilaterally. Patient has been intubated. Endotracheal tube terminates 4 cm above the carina. OG tube is coiled in the cervical esophagus. IMPRESSION: 1. Cardiomegaly and pulmonary vascular congestion concerning for congestive heart failure. 2. Bibasilar airspace disease likely reflects atelectasis. Infection is not excluded. 3. Satisfactory positioning of endotracheal tube. 4. OG tube is coiled in the cervical esophagus. These results will be called to the ordering clinician or representative by the Radiologist Assistant, and communication documented in the PACS or [employer]. Electronically Signed   By: Marin Roberts M.D.   On: 12/25/2021 19:05  ? ?DG Chest Port 1 View ? ?Result Date: 12/25/2021 ?CLINICAL DATA:  Evaluate for pneumonia. EXAM: PORTABLE CHEST 1 VIEW COMPARISON:  12/22/2021 FINDINGS: Stable cardiomediastinal contours. Lung volumes are decreased and there is asymmetric elevation of the right hemidiaphragm. Diffuse pulmonary vascular congestion. No pleural  effusion or edema. No airspace densities. IMPRESSION: 1. Low lung volumes with asymmetric elevation of right hemidiaphragm. 2. Pulmonary vascular congestion. Electronically Signed   By: Veronda Prude.D

## 2021-12-26 NOTE — Progress Notes (Signed)
Notified Elink of the following critical lab values: Lactic acid 3.0, Potassium 6.5, and Troponin 315.  No new orders at this time.  ?

## 2021-12-26 NOTE — Progress Notes (Signed)
RT note. ?Patient transported to MRI and back on vent without any complications noted. RT will continue to monitor.  ?

## 2021-12-26 NOTE — Consult Note (Signed)
? ?NAME:  Vincent GowdaDavid Roads, MRN:  [id number], DOB:  [birth year], LOS: 1 ?ADMISSION DATE:  12/25/2021, CONSULTATION DATE:  12/25/2021 ?REFERRING MD:  Dr. Corliss Skainseveshwar, CHIEF COMPLAINT:  left sided weakness  ? ?History of Present Illness:  ?HPI mostly obtained from medical chart review.  Attempted from patient but limited given aphasia.  ? ?61 year old male with prior history of HTN, HLD, and GERD who presented to ER as code stroke.  Patient was at the vet's office when he acutely developed left sided weakness and right gaze deviation at 0945.  Code stroke activated by EMS in addition to code STEMI based on diffuse STE.  Of note, patient recently diagnosed with RLL bronchopneumonia two days ago by his PCP.   ? ?He was evaluated by Neurology and Cardiology in ER, HR in 150s with no chest pain or dyspnea, with EKG not meeting criteria for STEMI but showed mild STE, felt possibly rate related versus possible pericarditis.  Echo and cardiac workup ordered.  CT head showed concern for subacute infarcts in the R PCA territory and two small areas possibly representing calcification or blood, therefore thrombolytics deferred.  CTA head/ neck and perfusion study showed acute right MCA anterior M2 occlusion.   ?Labs significant for WBC 20.6, Hgb 9.2, Hct 26.8, plts 205, normal coags, K 4.9, bicarb 21, glucose 138, BUN 34, sCr 1.83, iCa 1.03, low albumin/ protein, lactic acid 1.5. Trop hs pending.  CXR showing elevation of right hemidiaphragm with pulmonary vascular congestion.  Blood cultures sent.   Temp 100 in ER, remains ST, and initial BP 140/90, and remains on room air > 94%.  He was taken emergently to Neuro IR for mechanical thrombectomy with complete revascularization achieving TICI 3 revascularization.  Patient was extubated post intervention.  Briefly required phenylephrine in PACU.  Received 2L LR and albumin in procedure.  PCCM consulted for further medical assistance given concern for sepsis and possible cardioembolic  event related to stroke. ? ?Patient is currently only able to nod yes/ no to question given expressive aphasia and mostly incomprehensible speech.  Nodded yes to recent fever, cough with productive sputum, and headache.  Nods yes to recent bloody stools.  Did indicate that he started antibiotics from PCP.  Denied current or previous SOB, CP, palpitations, N/V/abd pain, lower leg swelling.   Patient indicates he lives alone, not married, no children, and has living parents out of state.  Non smoker, occasional but not daily ETOH, denies drug abuse.  ? ?Pertinent  Medical History  ?HLT, HLD, GERD, rising PSA level, impaired fasting glucose ? 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  Cards consulted for initial code STEMI with diffuse STE, no STEMI   ? ?Interim History / Subjective:  ?Patient continued to have lower GI bleeding since yesterday evening ?He became hypotensive started requiring IV vasopressors ?CT angiogram of chest abdomen pelvis showed no active bleeding but consistent with bilateral pneumonia ?Continue to spike fever ? ?Objective   ?Blood pressure 137/84, pulse 85, temperature 99.9 ?F (37.7 ?C), resp. rate (!) 30, height 5' 11.5" (1.816 m), weight 100.8 kg, SpO2 94 %. ?CVP:  [17 mmHg-24 mmHg] 24 mmHg  ?Vent Mode: PRVC ?FiO2 (%):  [40 %-100 %] 40 % ?Set Rate:  [30 bmp] 30 bmp ?Vt Set:  [600 mL] 600 mL ?PEEP:  [5 cmH20] 5 cmH20 ?Plateau Pressure:  [id number][16  cmH20-17 cmH20] 17 cmH20  ? ?Intake/Output Summary (Last 24 hours) at 12/26/2021 0952 ?Last data filed at 12/26/2021 0700 ?Gross per 24 hour  ?Intake 6737.35 ml  ?Output 2025 ml  ?Net 4712.35 ml  ? ?Filed Weights  ? 12/25/21 1000  ?Weight: 100.8 kg  ? ? ?Examination: ?General: Critically ill-appearing middle-age male, lying on the bed, orally intubated ?HEENT: MM pink/dry, pupils  3/reactive, anicertic, facial droop present, no obvious dental abnormalities, ETT/OGT in place ?Neuro: Sedated eyes closed, does not open, not following commands ?CV: rr, ST, no obvious murmur ?PULM: Coarse breath sounds, fine bilateral basal crackles, no wheezes ?GI: soft, nondistended, sluggish bowel sounds present  ?Extremities: warm/dry, no LE edema, previous left middle finger amputation ?Skin: see pictures.  Splinter hemorrhage noted on left middle finger and suspected janeway lesions to right hand and soles of feet as these are not painful to patient ? ? ? ? ? ? ? ? ? ?Resolved Hospital Problem list   ? ?Assessment & Plan:  ?Acute R MCA, with M2 occlusion s/p mechanical thrombectomy with complete revascularization.  ?Continue neuro watch every hour ?Stroke team is following ?Continue secondary stroke prophylaxis ?SBP goal 120-140 ?MRI/MRI brain is pending ?Echo is pending ?Continue antiplatelet and statin ? ? ?Acute hypoxic respiratory failure ?Bilateral lower lobe pneumonia, likely aspiration  ?Patient had to be reintubated yesterday due to increasing work of breathing and hypoxia ?Continue lung protective ventilation ?Discontinue IV ceftriaxone and azithromycin ?Continue vancomycin and started on IV Unasyn ?Follow-up cultures ?VAP bundle ?PAD protocol with RASS goal 0/-1, currently on Precedex and fentanyl ? ?Sepsis with septic shock, POA ?Suspected IE  ?Patient started requiring IV vasopressors due to shock combination of septic and hemorrhagic with GI bleeding ?Continue aggressive IV fluid resuscitation ?Titrate vasopressors with map goal 65 ?Continue IV antibiotics ?Follow-up cultures ?Echocardiogram is pending ? ? ?Diffuse mild STE, likely due to acute pericarditis ?Demand cardiac ischemia ?Suspected to be related to possible IE as above vs sepsis vs stress demand ?Serum troponins are trending down ?Will likely will need TEE  ? ?AKI, due to septic ATN versus contrast-induced ?Patient serum creatinine  continue to trend up ?Monitor intake and output ?Avoid nephrotoxic agents ?Closely monitor serum creatinine ? ?Acute blood loss anemia ?Lower GI bleeding ?Patient started with large amount of bleeding per rectum ?He had CT angiogram of the abdomen and pelvis which showed no extravasation ?GI has been consulted ?Continue PPI BID ?Required multiple blood transfusion ? ?Prediabetes with hyperglycemia ?Patient's hemoglobin A1c is 6 ?Continue sliding scale insulin with CBG goal 140-180 ? ?Best Practice (right click and "Reselect all SmartList Selections" daily)  ? ?Diet/type: NPO.  Tube feeds ?DVT prophylaxis: LMWH ?GI prophylaxis: PPI ?Lines: N/A ?Foley:  N/A ?Code Status:  full code ?Last date of multidisciplinary goals of care discussion [4/15, patient's sister was updated at bedside, please see iPal note] ?  ? ?Labs   ?CBC: ?Recent Labs  ?Lab 12/25/21 ?1048 12/25/21 ?1315 12/25/21 ?1526 12/25/21 ?1928 12/25/21 ?2027 12/25/21 ?2209 12/25/21 ?2319 12/26/21 ?0230  ?WBC 20.0* 20.6*  --   --   --   --   --  22.3*  ?NEUTROABS 16.3*  --   --   --   --   --   --  17.1*  ?HGB 11.8* 9.2*   < > 7.0* 6.5* 6.8* 9.5* 9.6*  ?HCT 35.0* 26.8*   < > 21.1* 19.0* 20.0* 27.3* 27.2*  ?MCV 91.1 91.5  --   --   --   --   --  85.5  ?PLT 199 205  --   --   --   --  187 202  ? < > = values in this interval not displayed.  ? ? ?Basic Metabolic Panel: ?Recent Labs  ?Lab 12/25/21 ?1032 12/25/21 ?1048 12/25/21 ?1923 12/25/21 ?2027 12/25/21 ?2053 12/25/21 ?2209 12/25/21 ?2319 12/26/21 ?0230  ?NA 139 138 139 137 140 137  --  140  ?K 4.8 4.9 5.5* 6.1* 6.5* 5.7* 5.3* 4.9  ?CL 108 108 112*  --  113*  --   --  112*  ?CO2  --  21* 21*  --  19*  --   --  20*  ?GLUCOSE 139* 138* 242*  --  241*  --   --  299*  ?BUN 34* 34* 36*  --  40*  --   --  40*  ?CREATININE 1.90* 1.83* 1.73*  --  1.98*  --   --  1.99*  ?CALCIUM  --  7.9* 6.8*  --  6.7*  --   --  6.6*  ? ?GFR: ?Estimated Creatinine Clearance: 48.1 mL/min (A) (by C-G formula based on SCr of 1.99 mg/dL  (H)). ?Recent Labs  ?Lab 12/25/21 ?1048 12/25/21 ?1315 12/25/21 ?1520 12/25/21 ?2053 12/26/21 ?0230  ?PROCALCITON  --  2.95  --   --   --   ?WBC 20.0* 20.6*  --   --  22.3*  ?LATICACIDVEN  --  1.5 1.5 3.0*  --   ?

## 2021-12-26 NOTE — Progress Notes (Signed)
Transported patient to C.T while on the ventilator. Patient remained stable during transport  

## 2021-12-27 DIAGNOSIS — R972 Elevated prostate specific antigen [PSA]: Secondary | ICD-10-CM

## 2021-12-27 DIAGNOSIS — K56609 Unspecified intestinal obstruction, unspecified as to partial versus complete obstruction: Secondary | ICD-10-CM

## 2021-12-27 DIAGNOSIS — D649 Anemia, unspecified: Secondary | ICD-10-CM

## 2021-12-27 LAB — BASIC METABOLIC PANEL
Anion gap: 8 (ref 5–15)
BUN: 53 mg/dL — ABNORMAL HIGH (ref 6–20)
CO2: 22 mmol/L (ref 22–32)
Calcium: 7 mg/dL — ABNORMAL LOW (ref 8.9–10.3)
Chloride: 113 mmol/L — ABNORMAL HIGH (ref 98–111)
Creatinine, Ser: 2.33 mg/dL — ABNORMAL HIGH (ref 0.61–1.24)
GFR, Estimated: 31 mL/min — ABNORMAL LOW (ref >60)
Glucose, Bld: 146 mg/dL — ABNORMAL HIGH (ref 70–99)
Potassium: 4.6 mmol/L (ref 3.5–5.1)
Sodium: 143 mmol/L (ref 135–145)

## 2021-12-27 LAB — HEPATIC FUNCTION PANEL
ALT: 24 U/L (ref 0–44)
AST: 33 U/L (ref 15–41)
Albumin: 1.6 g/dL — ABNORMAL LOW (ref 3.5–5.0)
Alkaline Phosphatase: 47 U/L (ref 38–126)
Bilirubin, Direct: <0.1 mg/dL (ref 0.0–0.2)
Total Bilirubin: 0.6 mg/dL (ref 0.3–1.2)
Total Protein: 4.2 g/dL — ABNORMAL LOW (ref 6.5–8.1)

## 2021-12-27 LAB — PSA: Prostatic Specific Antigen: 25.79 ng/mL — ABNORMAL HIGH (ref 0.00–4.00)

## 2021-12-27 LAB — GLUCOSE, CAPILLARY
Glucose-Capillary: 104 mg/dL — ABNORMAL HIGH (ref 70–99)
Glucose-Capillary: 109 mg/dL — ABNORMAL HIGH (ref 70–99)
Glucose-Capillary: 122 mg/dL — ABNORMAL HIGH (ref 70–99)
Glucose-Capillary: 147 mg/dL — ABNORMAL HIGH (ref 70–99)
Glucose-Capillary: 152 mg/dL — ABNORMAL HIGH (ref 70–99)
Glucose-Capillary: 164 mg/dL — ABNORMAL HIGH (ref 70–99)

## 2021-12-27 LAB — CBC
HCT: 23 % — ABNORMAL LOW (ref 39.0–52.0)
Hemoglobin: 7.9 g/dL — ABNORMAL LOW (ref 13.0–17.0)
MCH: 30.4 pg (ref 26.0–34.0)
MCHC: 34.3 g/dL (ref 30.0–36.0)
MCV: 88.5 fL (ref 80.0–100.0)
Platelets: 205 10*3/uL (ref 150–400)
RBC: 2.6 MIL/uL — ABNORMAL LOW (ref 4.22–5.81)
RDW: 16.2 % — ABNORMAL HIGH (ref 11.5–15.5)
WBC: 14.8 10*3/uL — ABNORMAL HIGH (ref 4.0–10.5)
nRBC: 0 % (ref 0.0–0.2)

## 2021-12-27 MED ORDER — PEG-KCL-NACL-NASULF-NA ASC-C 100 G PO SOLR: 1.0000 | Freq: Once | ORAL | Status: DC

## 2021-12-27 MED ORDER — PEG-KCL-NACL-NASULF-NA ASC-C 100 G PO SOLR
0.5000 | Freq: Once | ORAL | Status: DC
  Filled 2021-12-27: qty 1

## 2021-12-27 MED ORDER — PEG-KCL-NACL-NASULF-NA ASC-C 100 G PO SOLR
0.5000 | Freq: Once | ORAL | Status: AC
  Administered 2021-12-27: 100 g via ORAL
  Filled 2021-12-27: qty 1

## 2021-12-27 MED ORDER — SODIUM CHLORIDE 0.9 % IV SOLN: INTRAVENOUS | Status: DC | PRN

## 2021-12-27 MED ORDER — AMLODIPINE BESYLATE 10 MG PO TABS: 10.0000 mg | ORAL_TABLET | Freq: Every day | ORAL | Status: DC

## 2021-12-27 MED ORDER — CLONIDINE HCL 0.1 MG PO TABS
0.1000 mg | ORAL_TABLET | Freq: Three times a day (TID) | ORAL | Status: DC
  Administered 2021-12-27 (×2): 0.1 mg
  Filled 2021-12-27 (×2): qty 1

## 2021-12-27 MED ORDER — PEG-KCL-NACL-NASULF-NA ASC-C 100 G PO SOLR
0.5000 | Freq: Once | ORAL | Status: AC
  Administered 2021-12-28: 100 g

## 2021-12-27 MED ORDER — LACTATED RINGERS IV BOLUS
1000.0000 mL | Freq: Once | INTRAVENOUS | Status: AC
  Administered 2021-12-27: 1000 mL via INTRAVENOUS

## 2021-12-27 NOTE — Progress Notes (Signed)
PT Cancellation Note ? ?Patient Details ?Name: Vincent Ferrell ?MRN: [id number] ?DOB: 1961-04-10 ? ? ?Cancelled Treatment:    Reason Eval/Treat Not Completed: Medical issues which prohibited therapy at this time. Pt remains intubated, RN recommending PT check back for evaluation, will continue to follow and evaluate as appropriate.  ? ?Vickki Muff, PT, DPT  ? ?Acute Rehabilitation Department ?Pager #: [phone] - [phone] ? ? ?Ronnie Derby ?12/27/2021, 12:23 PM ?

## 2021-12-27 NOTE — H&P (View-Only) (Signed)
STROKE TEAM PROGRESS NOTE  ? ?SUBJECTIVE (INTERVAL HISTORY) ?His girlfriend is at the bedside.  Patient still intubated, eyes closed with intermittent opening, however able to follow all simple commands.  Patient afebrile today.  Blood culture negative so far.  On antibiotics. TEE planned in am. Overnight had again GI bleeding, Hb 7.9 this am, GI on board, plan colonoscopy in am.  ? ?OBJECTIVE ?Temp:  [99.3 ?F (37.4 ?C)-101.3 ?F (38.5 ?C)] 99.3 ?F (37.4 ?C) (04/16 1200) ?Pulse Rate:  [71-115] 80 (04/16 1515) ?Cardiac Rhythm: Normal sinus rhythm;Sinus tachycardia (04/16 0800) ?Resp:  [27-33] 30 (04/16 1515) ?BP: (94-156)/(57-105) 134/61 (04/16 1515) ?SpO2:  [92 %-100 %] 100 % (04/16 1515) ?Arterial Line BP: (88-196)/(50-86) 134/61 (04/16 1515) ?FiO2 (%):  [40 %-60 %] 40 % (04/16 1515) ? ?Recent Labs  ?Lab 12/26/21 ?2051 12/26/21 ?2347 12/27/21 ?0418 12/27/21 ?0725 12/27/21 ?1106  ?GLUCAP 109* 156* 152* 147* 104*  ? ?Recent Labs  ?Lab 12/25/21 ?1048 12/25/21 ?1923 12/25/21 ?2027 12/25/21 ?2053 12/25/21 ?2209 12/25/21 ?2319 12/26/21 ?0230 12/27/21 ?0032  ?NA 138 139 137 140 137  --  140 143  ?K 4.9 5.5* 6.1* 6.5* 5.7* 5.3* 4.9 4.6  ?CL 108 112*  --  113*  --   --  112* 113*  ?CO2 21* 21*  --  19*  --   --  20* 22  ?GLUCOSE 138* 242*  --  241*  --   --  299* 146*  ?BUN 34* 36*  --  40*  --   --  40* 53*  ?CREATININE 1.83* 1.73*  --  1.98*  --   --  1.99* 2.33*  ?CALCIUM 7.9* 6.8*  --  6.7*  --   --  6.6* 7.0*  ? ?Recent Labs  ?Lab 12/25/21 ?1048 12/27/21 ?0032  ?AST 29 33  ?ALT 34 24  ?ALKPHOS 100 47  ?BILITOT 0.5 0.6  ?PROT 5.7* 4.2*  ?ALBUMIN 2.1* 1.6*  ? ?Recent Labs  ?Lab 12/25/21 ?1048 12/25/21 ?1315 12/25/21 ?1526 12/25/21 ?2027 12/25/21 ?2209 12/25/21 ?2319 12/26/21 ?0230 12/27/21 ?0032  ?WBC 20.0* 20.6*  --   --   --   --  22.3* 14.8*  ?NEUTROABS 16.3*  --   --   --   --   --  17.1*  --   ?HGB 11.8* 9.2*   < > 6.5* 6.8* 9.5* 9.6* 7.9*  ?HCT 35.0* 26.8*   < > 19.0* 20.0* 27.3* 27.2* 23.0*  ?MCV 91.1 91.5  --   --    --   --  85.5 88.5  ?PLT 199 205  --   --   --  187 202 205  ? < > = values in this interval not displayed.  ? ?No results for input(s): CKTOTAL, CKMB, CKMBINDEX, TROPONINI in the last 168 hours. ?Recent Labs  ?  12/25/21 ?1048 12/25/21 ?2319  ?LABPROT 14.9 15.9*  ?INR 1.2 1.3*  ? ?Recent Labs  ?  12/25/21 ?1127  ?COLORURINE YELLOW  ?LABSPEC 1.034*  ?PHURINE 5.0  ?GLUCOSEU NEGATIVE  ?HGBUR LARGE*  ?BILIRUBINUR NEGATIVE  ?KETONESUR NEGATIVE  ?PROTEINUR NEGATIVE  ?NITRITE NEGATIVE  ?LEUKOCYTESUR NEGATIVE  ?  ?   ?Component Value Date/Time  ? CHOL 89 12/26/2021 0230  ? TRIG 214 (H) 12/26/2021 0230  ? HDL <10 (L) 12/26/2021 0230  ? CHOLHDL NOT CALCULATED 12/26/2021 0230  ? VLDL 43 (H) 12/26/2021 0230  ? LDLCALC NOT CALCULATED 12/26/2021 0230  ? ?Lab Results  ?Component Value Date  ? HGBA1C 6.0 (H) 12/25/2021  ? ?   ?  Component Value Date/Time  ? LABOPIA NONE DETECTED 12/26/2021 0116  ? COCAINSCRNUR NONE DETECTED 12/26/2021 0116  ? LABBENZ NONE DETECTED 12/26/2021 0116  ? AMPHETMU NONE DETECTED 12/26/2021 0116  ? THCU NONE DETECTED 12/26/2021 0116  ? LABBARB NONE DETECTED 12/26/2021 0116  ?  ?No results for input(s): ETH in the last 168 hours. ? ?I have personally reviewed the radiological images below and agree with the radiology interpretations. ? ?DG Abd 1 View ? ?Result Date: 12/25/2021 ?CLINICAL DATA:  Orogastric tube placement. EXAM: ABDOMEN - 1 VIEW COMPARISON:  None. FINDINGS: An orogastric tube is seen with its distal tip overlying the expected region of the gastric antrum. The bowel gas pattern is normal. No radio-opaque calculi or other significant radiographic abnormality are seen. IMPRESSION: Orogastric tube positioning, as described above. Electronically Signed   By: Aram Candela M.D.   On: 12/25/2021 19:09  ? ?DG Abd 1 View ? ?Result Date: 12/25/2021 ?CLINICAL DATA:  OG tube placement. EXAM: ABDOMEN - 1 VIEW COMPARISON:  One view chest x-ray 12:45 p.m. FINDINGS: Gaseous distention of the stomach noted.  Bowel gas pattern otherwise unremarkable. OG tube is not visualized over the distal esophagus or stomach. IMPRESSION: OG tube is not visualized over the distal esophagus or stomach. Gaseous distention of the stomach. Electronically Signed   By: Marin Roberts M.D.   On: 12/25/2021 19:06  ? ?MR ANGIO HEAD WO CONTRAST ? ?Result Date: 12/26/2021 ?CLINICAL DATA:  Acute infarct. Revascularization of superior right M2 segment. EXAM: MRA HEAD WITHOUT CONTRAST TECHNIQUE: Angiographic images of the Circle of Willis were acquired using MRA technique without intravenous contrast. COMPARISON:  CTA head and neck 12/25/2021.  Revascularization films. FINDINGS: Anterior circulation: The internal carotid arteries are within normal limits from high cervical segments through the ICA termini bilaterally. The A1 and M1 segments are normal. MCA bifurcations are within normal limits. MCA bifurcations are intact. The revascularized superior right M2 segments remain patent. ACA and MCA branch vessels are normal bilaterally. Posterior circulation: The vertebral arteries are codominant. AICA vessels are dominant. The basilar artery is within normal limits. Right posterior cerebral artery originates from basilar tip. Left posterior cerebral artery is of fetal type. A left P1 segment contributes. PCA branch vessels are within normal limits bilaterally. Anatomic variants: None Other: None. IMPRESSION: 1. Normal variant MRA Circle of Willis without evidence for significant proximal stenosis, aneurysm, or branch vessel occlusion. 2. The revascularized superior right M2 segments remain patent. Electronically Signed   By: Marin Roberts M.D.   On: 12/26/2021 16:54  ? ?MR BRAIN WO CONTRAST ? ?Result Date: 12/26/2021 ?CLINICAL DATA:  Status post revascularization of right superior M2 occlusion. EXAM: MRI HEAD WITHOUT CONTRAST TECHNIQUE: Multiplanar, multiecho pulse sequences of the brain and surrounding structures were obtained without  intravenous contrast. COMPARISON:  CTA head and neck, CT perfusion, and revascularization images 12/25/21 FINDINGS: Brain: Diffusion-weighted images demonstrate acute hemorrhage within the superior right MCA division involving the right insular cortex and frontal lobe. Diffuse cortical infarct involves the high right parietal cortex. Sparing of the more inferior parietal lobe and superior temporal lobe noted. Focal area of nonhemorrhagic infarct is present in the right occipital pole. Contralateral left acute parietal and posterior left frontal lobe cortical infarcts are present as well. T2 hyperintensities are associated with the areas of acute/subacute infarction. Focal susceptibility involving the left parietal infarct noted. Additional areas of cortical hemorrhage are present in the high posterior right parietal lobe and in the right frontal operculum. Smaller foci of  hemorrhage are present in the posterior left frontal infarct. Minimal white matter changes are present otherwise. The ventricles are of normal size. No significant extraaxial fluid collection is present. The internal auditory canals are within normal limits. The brainstem and cerebellum are within normal limits. Vascular: Flow is present in the major intracranial arteries. Skull and upper cervical spine: The craniocervical junction is normal. Upper cervical spine is within normal limits. Marrow signal is unremarkable. Sinuses/Orbits: Bilateral mastoid effusions are present. There is some fluid in the posterior nasopharynx. OG tube and endotracheal tube are in place. The paranasal sinuses and mastoid air cells are otherwise clear. The globes and orbits are within normal limits. IMPRESSION: 1. 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 2. Contralateral acute/subacute cortical infarcts involving the left parietal and posterior left frontal lobe.  3. Focal area of acute/subacute infarct in the right occipital pole. 4. Focal cortical hemorrhage within scattered infarcts involving the left parietal and frontal lobes. Additional cortical hemorrhage

## 2021-12-27 NOTE — Progress Notes (Signed)
STROKE TEAM PROGRESS NOTE  ? ?SUBJECTIVE (INTERVAL HISTORY) ?His girlfriend is at the bedside.  Patient still intubated, eyes closed with intermittent opening, however able to follow all simple commands.  Patient afebrile today.  Blood culture negative so far.  On antibiotics. TEE planned in am. Overnight had again GI bleeding, Hb 7.9 this am, GI on board, plan colonoscopy in am.  ? ?OBJECTIVE ?Temp:  [99.3 ?F (37.4 ?C)-101.3 ?F (38.5 ?C)] 99.3 ?F (37.4 ?C) (04/16 1200) ?Pulse Rate:  [71-115] 80 (04/16 1515) ?Cardiac Rhythm: Normal sinus rhythm;Sinus tachycardia (04/16 0800) ?Resp:  [27-33] 30 (04/16 1515) ?BP: (94-156)/(57-105) 134/61 (04/16 1515) ?SpO2:  [92 %-100 %] 100 % (04/16 1515) ?Arterial Line BP: (88-196)/(50-86) 134/61 (04/16 1515) ?FiO2 (%):  [40 %-60 %] 40 % (04/16 1515) ? ?Recent Labs  ?Lab 12/26/21 ?2051 12/26/21 ?2347 12/27/21 ?0418 12/27/21 ?0725 12/27/21 ?1106  ?GLUCAP 109* 156* 152* 147* 104*  ? ?Recent Labs  ?Lab 12/25/21 ?1048 12/25/21 ?1923 12/25/21 ?2027 12/25/21 ?2053 12/25/21 ?2209 12/25/21 ?2319 12/26/21 ?0230 12/27/21 ?0032  ?NA 138 139 137 140 137  --  140 143  ?K 4.9 5.5* 6.1* 6.5* 5.7* 5.3* 4.9 4.6  ?CL 108 112*  --  113*  --   --  112* 113*  ?CO2 21* 21*  --  19*  --   --  20* 22  ?GLUCOSE 138* 242*  --  241*  --   --  299* 146*  ?BUN 34* 36*  --  40*  --   --  40* 53*  ?CREATININE 1.83* 1.73*  --  1.98*  --   --  1.99* 2.33*  ?CALCIUM 7.9* 6.8*  --  6.7*  --   --  6.6* 7.0*  ? ?Recent Labs  ?Lab 12/25/21 ?1048 12/27/21 ?0032  ?AST 29 33  ?ALT 34 24  ?ALKPHOS 100 47  ?BILITOT 0.5 0.6  ?PROT 5.7* 4.2*  ?ALBUMIN 2.1* 1.6*  ? ?Recent Labs  ?Lab 12/25/21 ?1048 12/25/21 ?1315 12/25/21 ?1526 12/25/21 ?2027 12/25/21 ?2209 12/25/21 ?2319 12/26/21 ?0230 12/27/21 ?0032  ?WBC 20.0* 20.6*  --   --   --   --  22.3* 14.8*  ?NEUTROABS 16.3*  --   --   --   --   --  17.1*  --   ?HGB 11.8* 9.2*   < > 6.5* 6.8* 9.5* 9.6* 7.9*  ?HCT 35.0* 26.8*   < > 19.0* 20.0* 27.3* 27.2* 23.0*  ?MCV 91.1 91.5  --   --    --   --  85.5 88.5  ?PLT 199 205  --   --   --  187 202 205  ? < > = values in this interval not displayed.  ? ?No results for input(s): CKTOTAL, CKMB, CKMBINDEX, TROPONINI in the last 168 hours. ?Recent Labs  ?  12/25/21 ?1048 12/25/21 ?2319  ?LABPROT 14.9 15.9*  ?INR 1.2 1.3*  ? ?Recent Labs  ?  12/25/21 ?1127  ?COLORURINE YELLOW  ?LABSPEC 1.034*  ?PHURINE 5.0  ?GLUCOSEU NEGATIVE  ?HGBUR LARGE*  ?BILIRUBINUR NEGATIVE  ?KETONESUR NEGATIVE  ?PROTEINUR NEGATIVE  ?NITRITE NEGATIVE  ?LEUKOCYTESUR NEGATIVE  ?  ?   ?Component Value Date/Time  ? CHOL 89 12/26/2021 0230  ? TRIG 214 (H) 12/26/2021 0230  ? HDL <10 (L) 12/26/2021 0230  ? CHOLHDL NOT CALCULATED 12/26/2021 0230  ? VLDL 43 (H) 12/26/2021 0230  ? LDLCALC NOT CALCULATED 12/26/2021 0230  ? ?Lab Results  ?Component Value Date  ? HGBA1C 6.0 (H) 12/25/2021  ? ?   ?  Component Value Date/Time  ? LABOPIA NONE DETECTED 12/26/2021 0116  ? COCAINSCRNUR NONE DETECTED 12/26/2021 0116  ? LABBENZ NONE DETECTED 12/26/2021 0116  ? AMPHETMU NONE DETECTED 12/26/2021 0116  ? THCU NONE DETECTED 12/26/2021 0116  ? LABBARB NONE DETECTED 12/26/2021 0116  ?  ?No results for input(s): ETH in the last 168 hours. ? ?I have personally reviewed the radiological images below and agree with the radiology interpretations. ? ?DG Abd 1 View ? ?Result Date: 12/25/2021 ?CLINICAL DATA:  Orogastric tube placement. EXAM: ABDOMEN - 1 VIEW COMPARISON:  None. FINDINGS: An orogastric tube is seen with its distal tip overlying the expected region of the gastric antrum. The bowel gas pattern is normal. No radio-opaque calculi or other significant radiographic abnormality are seen. IMPRESSION: Orogastric tube positioning, as described above. Electronically Signed   By: Aram Candela M.D.   On: 12/25/2021 19:09  ? ?DG Abd 1 View ? ?Result Date: 12/25/2021 ?CLINICAL DATA:  OG tube placement. EXAM: ABDOMEN - 1 VIEW COMPARISON:  One view chest x-ray 12:45 p.m. FINDINGS: Gaseous distention of the stomach noted.  Bowel gas pattern otherwise unremarkable. OG tube is not visualized over the distal esophagus or stomach. IMPRESSION: OG tube is not visualized over the distal esophagus or stomach. Gaseous distention of the stomach. Electronically Signed   By: Marin Roberts M.D.   On: 12/25/2021 19:06  ? ?MR ANGIO HEAD WO CONTRAST ? ?Result Date: 12/26/2021 ?CLINICAL DATA:  Acute infarct. Revascularization of superior right M2 segment. EXAM: MRA HEAD WITHOUT CONTRAST TECHNIQUE: Angiographic images of the Circle of Willis were acquired using MRA technique without intravenous contrast. COMPARISON:  CTA head and neck 12/25/2021.  Revascularization films. FINDINGS: Anterior circulation: The internal carotid arteries are within normal limits from high cervical segments through the ICA termini bilaterally. The A1 and M1 segments are normal. MCA bifurcations are within normal limits. MCA bifurcations are intact. The revascularized superior right M2 segments remain patent. ACA and MCA branch vessels are normal bilaterally. Posterior circulation: The vertebral arteries are codominant. AICA vessels are dominant. The basilar artery is within normal limits. Right posterior cerebral artery originates from basilar tip. Left posterior cerebral artery is of fetal type. A left P1 segment contributes. PCA branch vessels are within normal limits bilaterally. Anatomic variants: None Other: None. IMPRESSION: 1. Normal variant MRA Circle of Willis without evidence for significant proximal stenosis, aneurysm, or branch vessel occlusion. 2. The revascularized superior right M2 segments remain patent. Electronically Signed   By: Marin Roberts M.D.   On: 12/26/2021 16:54  ? ?MR BRAIN WO CONTRAST ? ?Result Date: 12/26/2021 ?CLINICAL DATA:  Status post revascularization of right superior M2 occlusion. EXAM: MRI HEAD WITHOUT CONTRAST TECHNIQUE: Multiplanar, multiecho pulse sequences of the brain and surrounding structures were obtained without  intravenous contrast. COMPARISON:  CTA head and neck, CT perfusion, and revascularization images 12/25/21 FINDINGS: Brain: Diffusion-weighted images demonstrate acute hemorrhage within the superior right MCA division involving the right insular cortex and frontal lobe. Diffuse cortical infarct involves the high right parietal cortex. Sparing of the more inferior parietal lobe and superior temporal lobe noted. Focal area of nonhemorrhagic infarct is present in the right occipital pole. Contralateral left acute parietal and posterior left frontal lobe cortical infarcts are present as well. T2 hyperintensities are associated with the areas of acute/subacute infarction. Focal susceptibility involving the left parietal infarct noted. Additional areas of cortical hemorrhage are present in the high posterior right parietal lobe and in the right frontal operculum. Smaller foci of  hemorrhage are present in the posterior left frontal infarct. Minimal white matter changes are present otherwise. The ventricles are of normal size. No significant extraaxial fluid collection is present. The internal auditory canals are within normal limits. The brainstem and cerebellum are within normal limits. Vascular: Flow is present in the major intracranial arteries. Skull and upper cervical spine: The craniocervical junction is normal. Upper cervical spine is within normal limits. Marrow signal is unremarkable. Sinuses/Orbits: Bilateral mastoid effusions are present. There is some fluid in the posterior nasopharynx. OG tube and endotracheal tube are in place. The paranasal sinuses and mastoid air cells are otherwise clear. The globes and orbits are within normal limits. IMPRESSION: 1. 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 2. Contralateral acute/subacute cortical infarcts involving the left parietal and posterior left frontal lobe.  3. Focal area of acute/subacute infarct in the right occipital pole. 4. Focal cortical hemorrhage within scattered infarcts involving the left parietal and frontal lobes. Additional cortical hemorrhage

## 2021-12-27 NOTE — Progress Notes (Signed)
? ?NAME:  Vincent GowdaDavid Alberta, MRN:  [id number], DOB:  12/12/1960, LOS: 2 ?ADMISSION DATE:  12/25/2021, CONSULTATION DATE:  12/25/2021 ?REFERRING MD:  Dr. Corliss Skainseveshwar, CHIEF COMPLAINT:  left sided weakness  ? ?History of Present Illness:  ?HPI mostly obtained from medical chart review.  Attempted from patient but limited given aphasia.  ? ?61 year old male with prior history of HTN, HLD, and GERD who presented to ER as code stroke.  Patient was at the vet[place] office when he acutely developed left sided weakness and right gaze deviation at 0945.  Code stroke activated by EMS in addition to code STEMI based on diffuse STE.  Of note, patient recently diagnosed with RLL bronchopneumonia two days ago by his PCP.   ? ?He was evaluated by Neurology and Cardiology in ER, HR in 150s with no chest pain or dyspnea, with EKG not meeting criteria for STEMI but showed mild STE, felt possibly rate related versus possible pericarditis.  Echo and cardiac workup ordered.  CT head showed concern for subacute infarcts in the R PCA territory and two small areas possibly representing calcification or blood, therefore thrombolytics deferred.  CTA head/ neck and perfusion study showed acute right MCA anterior M2 occlusion.   ?Labs significant for WBC 20.6, Hgb 9.2, Hct 26.8, plts 205, normal coags, K 4.9, bicarb 21, glucose 138, BUN 34, sCr 1.83, iCa 1.03, low albumin/ protein, lactic acid 1.5. Trop hs pending.  CXR showing elevation of right hemidiaphragm with pulmonary vascular congestion.  Blood cultures sent.   Temp 100 in ER, remains ST, and initial BP 140/90, and remains on room air > 94%.  He was taken emergently to Neuro IR for mechanical thrombectomy with complete revascularization achieving TICI 3 revascularization.  Patient was extubated post intervention.  Briefly required phenylephrine in PACU.  Received 2L LR and albumin in procedure.  PCCM consulted for further medical assistance given concern for sepsis and possible cardioembolic  event related to stroke. ? ?Patient is currently only able to nod yes/ no to question given expressive aphasia and mostly incomprehensible speech.  Nodded yes to recent fever, cough with productive sputum, and headache.  Nods yes to recent bloody stools.  Did indicate that he started antibiotics from PCP.  Denied current or previous SOB, CP, palpitations, N/V/abd pain, lower leg swelling.   Patient indicates he lives alone, not married, no children, and has living parents out of state.  Non smoker, occasional but not daily ETOH, denies drug abuse.  ? ?Pertinent  Medical History  ?HLT, HLD, GERD, rising PSA level, impaired fasting glucose ? 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  Cards consulted for initial code STEMI with diffuse STE, no STEMI   ? ?Interim History / Subjective:  ?Patient remained afebrile, white count is trending down ?Continues to have lower GI bleeding ? ?Objective   ?Blood pressure 97/61, pulse 75, temperature 99.3 ?F (37.4 ?C), temperature source Axillary, resp. rate (!) 30, height 5' 11.5" (1.816 m), weight 100.8 kg, SpO2 100 %. ?   ?Vent Mode: PRVC ?FiO2 (%):  [40 %-60 %] 40 % ?Set Rate:  [30 bmp] 30 bmp ?Vt Set:  [600 mL] 600 mL ?PEEP:  [5 cmH20] 5 cmH20 ?Pressure Support:  [5 cmH20] 5 cmH20 ?Plateau Pressure:  [13 cmH20-21 cmH20] 21 cmH20  ? ?Intake/Output Summary (Last 24 hours) at 12/27/2021 1337 ?Last data filed  at 12/27/2021 0700 ?Gross per 24 hour  ?Intake 2335.74 ml  ?Output 1450 ml  ?Net 885.74 ml  ? ?Filed Weights  ? 12/25/21 1000  ?Weight: 100.8 kg  ? ? ?Examination: ?General: Critically ill-appearing middle-age male, lying on the bed, orally intubated ?HEENT: MM pink/dry, pupils 3/reactive, anicertic, left facial droop present, no obvious dental abnormalities, ETT/OGT in place ?Neuro: Opens eyes with vocal  stimuli, following simple, commands, plegic on left side, antigravity on right side ?CV: rr, ST, no obvious murmur ?PULM: Coarse breath sounds, fine bilateral basal crackles on the right side, no wheezes ?GI: soft, nondistended, sluggish bowel sounds present  ?Extremities: warm/dry, no LE edema, previous left middle finger amputation ?Skin: see pictures.  Splinter hemorrhage noted on left middle finger and suspected janeway lesions to right hand and soles of feet as these are not painful to patient ? ? ?Resolved Hospital Problem list   ? ?Assessment & Plan:  ?Acute R MCA, with M2 occlusion s/p mechanical thrombectomy with complete revascularization.  ?Continue neuro watch every hour ?Stroke team is following ?Continue secondary stroke prophylaxis ?SBP goal 120-140 ?MRI/MRI brain is done which confirmed right MCA territory stroke ?Echocardiogram was done which showed normal EF no wall motion abnormalities ?Continue antiplatelet and statin ? ?Acute hypoxic respiratory failure ?Bilateral lower lobe pneumonia, likely aspiration  ?Continue lung protective ventilation ?Continue vancomycin and started on IV Unasyn ?Cultures have been negative so far ?VAP bundle ?PAD protocol with RASS goal 0/-1, currently on Precedex and fentanyl ? ?Sepsis with septic shock, POA ?Suspected IE  ?Acute septic encephalopathy ?Patient is off vasopressors ?Now he is hypertensive ?Stop IV fluid ?Continue IV antibiotics ?TTE is negative for vegetations, scheduled for TEE tomorrow  ? ?Diffuse mild STE, likely due to acute pericarditis ?Demand cardiac ischemia ?Suspected to be related to possible IE as above vs sepsis vs stress demand ?Serum troponins are trending down ?TEE tomorrow ? ?AKI, due to septic ATN versus contrast-induced ?Patient serum creatinine continue to trend up ?Monitor intake and output ?Avoid nephrotoxic agents ?Closely monitor serum creatinine ? ?Acute blood loss anemia ?Lower GI bleeding ?Patient continued to have lower GI  bleeding ?Appreciate GI input, plan for colonoscopy tomorrow ?He had CT angiogram of the abdomen and pelvis which showed no extravasation ?Continue PPI BID ?Required multiple blood transfusion ? ?Prediabetes with hyperglycemia ?Patient's hemoglobin A1c is 6 ?Continue sliding scale insulin with CBG goal 140-180 ? ?Ileus/partial small bowel obstruction ?NG output has decreased ?Hold tube feeds ? ?Best Practice (right click and "Reselect all SmartList Selections" daily)  ? ?Diet/type: NPO.  ?DVT prophylaxis: LMWH ?GI prophylaxis: PPI ?Lines: N/A ?Foley:  N/A ?Code Status:  full code ?Last date of multidisciplinary goals of care discussion [4/15, patient's sister was updated at bedside, please see plan of care note] ?  ? ?Labs   ?CBC: ?Recent Labs  ?Lab 12/25/21 ?1048 12/25/21 ?1315 12/25/21 ?1526 12/25/21 ?2027 12/25/21 ?2209 12/25/21 ?2319 12/26/21 ?0230 12/27/21 ?0032  ?WBC 20.0* 20.6*  --   --   --   --  22.3* 14.8*  ?NEUTROABS 16.3*  --   --   --   --   --  17.1*  --   ?HGB 11.8* 9.2*   < > 6.5* 6.8* 9.5* 9.6* 7.9*  ?HCT 35.0* 26.8*   < > 19.0* 20.0* 27.3* 27.2* 23.0*  ?MCV 91.1 91.5  --   --   --   --  85.5 88.5  ?PLT 199 205  --   --   --  187 202 205  ? < > = values in this interval not displayed.  ? ? ?Basic Metabolic Panel: ?Recent Labs  ?Lab 12/25/21 ?1048 12/25/21 ?1923 12/25/21 ?2027 12/25/21 ?2053 12/25/21 ?2209 12/25/21 ?2319 12/26/21 ?0230 12/27/21 ?0032  ?NA 138 139 137 140 137  --  140 143  ?K 4.9 5.5* 6.1* 6.5* 5.7* 5.3* 4.9 4.6  ?CL 108 112*  --  113*  --   --  112* 113*  ?CO2 21* 21*  --  19*  --   --  20* 22  ?GLUCOSE 138* 242*  --  241*  --   --  299* 146*  ?BUN 34* 36*  --  40*  --   --  40* 53*  ?CREATININE 1.83* 1.73*  --  1.98*  --   --  1.99* 2.33*  ?CALCIUM 7.9* 6.8*  --  6.7*  --   --  6.6* 7.0*  ? ?GFR: ?Estimated Creatinine Clearance: 41.1 mL/min (A) (by C-G formula based on SCr of 2.33 mg/dL (H)). ?Recent Labs  ?Lab 12/25/21 ?1048 12/25/21 ?1315 12/25/21 ?1520 12/25/21 ?2053  12/26/21 ?0230 12/27/21 ?0032  ?PROCALCITON  --  2.95  --   --   --   --   ?WBC 20.0* 20.6*  --   --  22.3* 14.8*  ?LATICACIDVEN  --  1.5 1.5 3.0*  --   --   ? ? ?Liver Function Tests: ?Recent Labs  ?Lab 12/25/21 ?1048 04

## 2021-12-27 NOTE — Progress Notes (Signed)
SLP Cancellation Note ? ?Patient Details ?Name: Vincent Ferrell ?MRN: [id number] ?DOB: 1961-05-13 ? ? ?Cancelled treatment:       Reason Eval/Treat Not Completed: Medical issues which prohibited therapy; pt continues to be intubated; ST will continue efforts as pt able to participate. ? ? ?Tressie Stalker, M.S., CCC-SLP ?12/27/2021, 9:06 AM ?

## 2021-12-27 NOTE — Progress Notes (Addendum)
? ? ? ?[facility] Progress Note ? ?CC:  GI bleed  ? ?Subjective: His intubated, sedated. He is using thumbs up or thumbs down to respond to questions. His significant other is at the bedside. No bloody bowel movements thus far today. Hunter RN verified patient last past 4 dark maroon stools x 4 last night.  ? ?Objective:  ?Vital signs in last 24 hours: ?Temp:  [99.3 ?F (37.4 ?C)-101.3 ?F (38.5 ?C)] 99.3 ?F (37.4 ?C) (04/16 1200) ?Pulse Rate:  [71-115] 75 (04/16 1200) ?Resp:  [27-33] 30 (04/16 1200) ?BP: (94-156)/(57-105) 97/61 (04/16 1200) ?SpO2:  [92 %-100 %] 100 % (04/16 1200) ?Arterial Line BP: (88-196)/(50-86) 117/57 (04/16 1200) ?FiO2 (%):  [40 %-60 %] 40 % (04/16 1114) ?Last BM Date : 12/27/21 ? ?General:  61 year old male critically ill on the vent.  ?Heart: RRR, no murmur.  ?Pulm: Coarse breath sounds throughout.  ?Abdomen: Soft, nondistended. OG tube to intermittent suction, no bloody drainage. Positive bowel sounds x 4 quads. Patient instructed to squeeze my hand if he felt pain to his abdomen during exam, he squeezed my hand when palpating the RUQ, RLQ and LLQ areas indicating + tenderness without rebound or guarding.  ?Rectal: RN reported Dr. Tomasa Randunningham completed DRE earlier this am which showed maroon blood.  ?Extremities:  Without edema. ?Neurologic:  Sedated on Precedex but responsive. Follows simple commands. Does not open eyes.  ?Psych:  No agitation at this time.  ? ?Intake/Output from previous day: ?04/15 0701 - 04/16 0700 ?In: 3060.1 [I.V.:2272.2; IV Piggyback:787.9] ?Out: 2300 [Urine:1500; Emesis/NG output:800] ?Intake/Output this shift: ?No intake/output data recorded. ? ?Lab Results: ?Recent Labs  ?  12/25/21 ?1315 12/25/21 ?1526 12/25/21 ?2319 12/26/21 ?0230 12/27/21 ?0032  ?WBC 20.6*  --   --  22.3* 14.8*  ?HGB 9.2*   < > 9.5* 9.6* 7.9*  ?HCT 26.8*   < > 27.3* 27.2* 23.0*  ?PLT 205  --  187 202 205  ? < > = values in this interval not displayed.  ? ?BMET ?Recent Labs  ?   12/25/21 ?2053 12/25/21 ?2209 12/25/21 ?2319 12/26/21 ?0230 12/27/21 ?0032  ?NA 140 137  --  140 143  ?K 6.5* 5.7* 5.3* 4.9 4.6  ?CL 113*  --   --  112* 113*  ?CO2 19*  --   --  20* 22  ?GLUCOSE 241*  --   --  299* 146*  ?BUN 40*  --   --  40* 53*  ?CREATININE 1.98*  --   --  1.99* 2.33*  ?CALCIUM 6.7*  --   --  6.6* 7.0*  ? ?LFT ?Recent Labs  ?  12/27/21 ?0032  ?PROT 4.2*  ?ALBUMIN 1.6*  ?AST 33  ?ALT 24  ?ALKPHOS 47  ?BILITOT 0.6  ?BILIDIR <0.1  ?IBILI NOT CALCULATED  ? ?PT/INR ?Recent Labs  ?  12/25/21 ?1048 12/25/21 ?2319  ?LABPROT 14.9 15.9*  ?INR 1.2 1.3*  ? ?Hepatitis Panel ?No results for input(s): HEPBSAG, HCVAB, HEPAIGM, HEPBIGM in the last 72 hours. ? ?DG Abd 1 View ? ?Result Date: 12/25/2021 ?CLINICAL DATA:  Orogastric tube placement. EXAM: ABDOMEN - 1 VIEW COMPARISON:  None. FINDINGS: An orogastric tube is seen with its distal tip overlying the expected region of the gastric antrum. The bowel gas pattern is normal. No radio-opaque calculi or other significant radiographic abnormality are seen. IMPRESSION: Orogastric tube positioning, as described above. Electronically Signed   By: Aram Candelahaddeus  Houston M.D.   On: 12/25/2021 19:09  ? ?DG  Abd 1 View ? ?Result Date: 12/25/2021 ?CLINICAL DATA:  OG tube placement. EXAM: ABDOMEN - 1 VIEW COMPARISON:  One view chest x-ray 12:45 p.m. FINDINGS: Gaseous distention of the stomach noted. Bowel gas pattern otherwise unremarkable. OG tube is not visualized over the distal esophagus or stomach. IMPRESSION: OG tube is not visualized over the distal esophagus or stomach. Gaseous distention of the stomach. Electronically Signed   By: Marin Roberts M.D.   On: 12/25/2021 19:06  ? ?MR ANGIO HEAD WO CONTRAST ? ?Result Date: 12/26/2021 ?CLINICAL DATA:  Acute infarct. Revascularization of superior right M2 segment. EXAM: MRA HEAD WITHOUT CONTRAST TECHNIQUE: Angiographic images of the Circle of Willis were acquired using MRA technique without intravenous contrast. COMPARISON:   CTA head and neck 12/25/2021.  Revascularization films. FINDINGS: Anterior circulation: The internal carotid arteries are within normal limits from high cervical segments through the ICA termini bilaterally. The A1 and M1 segments are normal. MCA bifurcations are within normal limits. MCA bifurcations are intact. The revascularized superior right M2 segments remain patent. ACA and MCA branch vessels are normal bilaterally. Posterior circulation: The vertebral arteries are codominant. AICA vessels are dominant. The basilar artery is within normal limits. Right posterior cerebral artery originates from basilar tip. Left posterior cerebral artery is of fetal type. A left P1 segment contributes. PCA branch vessels are within normal limits bilaterally. Anatomic variants: None Other: None. IMPRESSION: 1. Normal variant MRA Circle of Willis without evidence for significant proximal stenosis, aneurysm, or branch vessel occlusion. 2. The revascularized superior right M2 segments remain patent. Electronically Signed   By: Marin Roberts M.D.   On: 12/26/2021 16:54  ? ?MR BRAIN WO CONTRAST ? ?Result Date: 12/26/2021 ?CLINICAL DATA:  Status post revascularization of right superior M2 occlusion. EXAM: MRI HEAD WITHOUT CONTRAST TECHNIQUE: Multiplanar, multiecho pulse sequences of the brain and surrounding structures were obtained without intravenous contrast. COMPARISON:  CTA head and neck, CT perfusion, and revascularization images 12/25/21 FINDINGS: Brain: Diffusion-weighted images demonstrate acute hemorrhage within the superior right MCA division involving the right insular cortex and frontal lobe. Diffuse cortical infarct involves the high right parietal cortex. Sparing of the more inferior parietal lobe and superior temporal lobe noted. Focal area of nonhemorrhagic infarct is present in the right occipital pole. Contralateral left acute parietal and posterior left frontal lobe cortical infarcts are present as well. T2  hyperintensities are associated with the areas of acute/subacute infarction. Focal susceptibility involving the left parietal infarct noted. Additional areas of cortical hemorrhage are present in the high posterior right parietal lobe and in the right frontal operculum. Smaller foci of hemorrhage are present in the posterior left frontal infarct. Minimal white matter changes are present otherwise. The ventricles are of normal size. No significant extraaxial fluid collection is present. The internal auditory canals are within normal limits. The brainstem and cerebellum are within normal limits. Vascular: Flow is present in the major intracranial arteries. Skull and upper cervical spine: The craniocervical junction is normal. Upper cervical spine is within normal limits. Marrow signal is unremarkable. Sinuses/Orbits: Bilateral mastoid effusions are present. There is some fluid in the posterior nasopharynx. OG tube and endotracheal tube are in place. The paranasal sinuses and mastoid air cells are otherwise clear. The globes and orbits are within normal limits. IMPRESSION: 1. 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 2. Contralateral acute/subacute cortical infarcts involving the left parietal and posterior left frontal lobe. 3. Focal area of  acute/subacute infarct in the right occipital pole. 4. Focal cortical hemorrhage within scattered infarcts involving the left parietal and frontal lobes. Additional cortical hemorrhage in the high posterior right parietal lobe and frontal operculum. 5. Bilateral mastoid effusions likely secondary to intubation. Electronically Signed   By: Marin Roberts M.D.   On: 12/26/2021 16:52  ? ?DG CHEST PORT 1 VIEW ? ?Result Date: 12/25/2021 ?CLINICAL DATA:  Endotracheal tube placement, central line placement. EXAM: PORTABLE CHEST 1 VIEW COMPARISON:  Earlier today. FINDINGS:  Endotracheal tube tip 4.3 cm from the carina at the level of the clavicular heads. New left subclavian central venous catheter tip overlies the upper SVC. No pneumothorax. Enteric tube in place with tip and si

## 2021-12-27 NOTE — Progress Notes (Signed)
? ? ?Referring Physician(s): ?Dr Erlinda Hong ? ?Supervising Physician: Luanne Bras ? ?Patient Status:  [facility] - In-pt ? ?Chief Complaint: ? ?CVA ?R MCA occlusion-- thrombectomy in NIR 12/25/21 ? ?Subjective: ? ?Still intubated this am ?Pt seems to be trying to communicate more today ?Moving all limbs but left arm to command today ?Giving thumbs up sign ? ?MRI yesterday:  IMPRESSION: ?1. 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 ?2. Contralateral acute/subacute cortical infarcts involving the left ?parietal and posterior left frontal lobe. ?3. Focal area of acute/subacute infarct in the right occipital pole. ?4. Focal cortical hemorrhage within scattered infarcts involving the ?left parietal and frontal lobes. Additional cortical hemorrhage in ?the high posterior right parietal lobe and frontal operculum. ?5. Bilateral mastoid effusions likely secondary to intubation ? ?MRA: IMPRESSION: ?1. Normal variant MRA Circle of Willis without evidence for ?significant proximal stenosis, aneurysm, or branch vessel occlusion. ?2. The revascularized superior right M2 segments remain patent. ? ?RN reports still with maroon stools; Hg down to 7.9-- for GI studies tomorrow ?Echo neg for vegetations ?BC neg so far ?TEE scheduled soon ? ?Allergies: ?Pollen extract ? ?Medications: ?Prior to Admission medications   ?Medication Sig Start Date End Date Taking? Authorizing Provider  ?aspirin EC 81 MG tablet Take 81 mg by mouth daily. Swallow whole.   Yes [provider]  ?benzonatate (TESSALON) 100 MG capsule Take 100 mg by mouth 3 (three) times daily as needed for cough. 12/22/21  Yes [provider]  ?doxycycline (VIBRAMYCIN) 100 MG capsule Take 100 mg by mouth 2 (two) times daily. 12/22/21  Yes [provider]  ?lisinopril (ZESTRIL) 5 MG tablet Take 5 mg by mouth daily. 12/03/21  Yes [provider]   ?loratadine (CLARITIN) 10 MG tablet Take 10 mg by mouth daily.   Yes [provider]  ?Omega-3 1000 MG CAPS Take 1,000 mg by mouth daily.   Yes [provider]  ?pantoprazole (PROTONIX) 20 MG tablet Take 20 mg by mouth daily. 12/03/21  Yes [provider]  ?simvastatin (ZOCOR) 40 MG tablet Take 40 mg by mouth at bedtime. 12/03/21  Yes [provider]  ? ? ? ?Vital Signs: ?BP 97/61   Pulse 75   Temp 99.3 ?F (37.4 ?C) (Axillary)   Resp (!) 30   Ht 5' 11.5" (1.816 m)   Wt 222 lb 3.6 oz (100.8 kg)   SpO2 100%   BMI 30.56 kg/m?  ? ?Intubated ?Moves left leg so much better today ?Left arm has no movement ?Rt arm and leg moving well ?Giving thumbs up sign to communicate ? ?Imaging: ?DG Abd 1 View ? ?Result Date: 12/25/2021 ?CLINICAL DATA:  Orogastric tube placement. EXAM: ABDOMEN - 1 VIEW COMPARISON:  None. FINDINGS: An orogastric tube is seen with its distal tip overlying the expected region of the gastric antrum. The bowel gas pattern is normal. No radio-opaque calculi or other significant radiographic abnormality are seen. IMPRESSION: Orogastric tube positioning, as described above. Electronically Signed   By: Virgina Norfolk M.D.   On: 12/25/2021 19:09  ? ?DG Abd 1 View ? ?Result Date: 12/25/2021 ?CLINICAL DATA:  OG tube placement. EXAM: ABDOMEN - 1 VIEW COMPARISON:  One view chest x-ray 12:45 p.m. FINDINGS: Gaseous distention of the stomach noted. Bowel gas pattern otherwise unremarkable. OG tube is not visualized over the distal esophagus or stomach. IMPRESSION: OG tube is not visualized over the distal esophagus or stomach. Gaseous distention  of the stomach. Electronically Signed   By: San Morelle M.D.   On: 12/25/2021 19:06  ? ?MR ANGIO HEAD WO CONTRAST ? ?Result Date: 12/26/2021 ?CLINICAL DATA:  Acute infarct. Revascularization of superior right M2 segment. EXAM: MRA HEAD WITHOUT CONTRAST TECHNIQUE: Angiographic images of the Circle of Willis were acquired using  MRA technique without intravenous contrast. COMPARISON:  CTA head and neck 12/25/2021.  Revascularization films. FINDINGS: Anterior circulation: The internal carotid arteries are within normal limits from high cervical segments through the ICA termini bilaterally. The A1 and M1 segments are normal. MCA bifurcations are within normal limits. MCA bifurcations are intact. The revascularized superior right M2 segments remain patent. ACA and MCA branch vessels are normal bilaterally. Posterior circulation: The vertebral arteries are codominant. AICA vessels are dominant. The basilar artery is within normal limits. Right posterior cerebral artery originates from basilar tip. Left posterior cerebral artery is of fetal type. A left P1 segment contributes. PCA branch vessels are within normal limits bilaterally. Anatomic variants: None Other: None. IMPRESSION: 1. Normal variant MRA Circle of Willis without evidence for significant proximal stenosis, aneurysm, or branch vessel occlusion. 2. The revascularized superior right M2 segments remain patent. Electronically Signed   By: San Morelle M.D.   On: 12/26/2021 16:54  ? ?MR BRAIN WO CONTRAST ? ?Result Date: 12/26/2021 ?CLINICAL DATA:  Status post revascularization of right superior M2 occlusion. EXAM: MRI HEAD WITHOUT CONTRAST TECHNIQUE: Multiplanar, multiecho pulse sequences of the brain and surrounding structures were obtained without intravenous contrast. COMPARISON:  CTA head and neck, CT perfusion, and revascularization images 12/25/21 FINDINGS: Brain: Diffusion-weighted images demonstrate acute hemorrhage within the superior right MCA division involving the right insular cortex and frontal lobe. Diffuse cortical infarct involves the high right parietal cortex. Sparing of the more inferior parietal lobe and superior temporal lobe noted. Focal area of nonhemorrhagic infarct is present in the right occipital pole. Contralateral left acute parietal and posterior left  frontal lobe cortical infarcts are present as well. T2 hyperintensities are associated with the areas of acute/subacute infarction. Focal susceptibility involving the left parietal infarct noted. Additional areas of cortical hemorrhage are present in the high posterior right parietal lobe and in the right frontal operculum. Smaller foci of hemorrhage are present in the posterior left frontal infarct. Minimal white matter changes are present otherwise. The ventricles are of normal size. No significant extraaxial fluid collection is present. The internal auditory canals are within normal limits. The brainstem and cerebellum are within normal limits. Vascular: Flow is present in the major intracranial arteries. Skull and upper cervical spine: The craniocervical junction is normal. Upper cervical spine is within normal limits. Marrow signal is unremarkable. Sinuses/Orbits: Bilateral mastoid effusions are present. There is some fluid in the posterior nasopharynx. OG tube and endotracheal tube are in place. The paranasal sinuses and mastoid air cells are otherwise clear. The globes and orbits are within normal limits. IMPRESSION: 1. 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 2. Contralateral acute/subacute cortical infarcts involving the left parietal and posterior left frontal lobe. 3. Focal area of acute/subacute infarct in the right occipital pole. 4. Focal cortical hemorrhage within scattered infarcts involving the left parietal and frontal lobes. Additional cortical hemorrhage in the high posterior right parietal lobe and frontal operculum. 5. Bilateral mastoid effusions likely secondary to intubation. Electronically Signed   By: San Morelle M.D.   On: 12/26/2021 16:52  ? ?CT CEREBRAL PERFUSION W CONTRAST ? ?  Result Date: 12/25/2021 ?CLINICAL DATA:  Code stroke. 61 year old male with right MCA M2 ELVO.  EXAM: CT PERFUSION BRAIN TECHNIQUE: Multiphase CT imaging of the brain was performed following IV bolus contrast injection. Subsequent parametric perfusion maps were calculated using RAPID software. RADIATION

## 2021-12-28 ENCOUNTER — Encounter (HOSPITAL_COMMUNITY)
Admission: EM | Disposition: A | Discharge status: Nursing Home (Includes ICF) | Payer: Self-pay | Source: Home / Self Care | Attending: Neurology

## 2021-12-28 ENCOUNTER — Inpatient Hospital Stay (HOSPITAL_COMMUNITY): Payer: Managed Care, Other (non HMO)

## 2021-12-28 ENCOUNTER — Encounter (HOSPITAL_COMMUNITY): Payer: Self-pay | Admitting: Neurology

## 2021-12-28 DIAGNOSIS — D72829 Elevated white blood cell count, unspecified: Secondary | ICD-10-CM

## 2021-12-28 DIAGNOSIS — I63511 Cerebral infarction due to unspecified occlusion or stenosis of right middle cerebral artery: Secondary | ICD-10-CM

## 2021-12-28 DIAGNOSIS — I6601 Occlusion and stenosis of right middle cerebral artery: Secondary | ICD-10-CM | POA: Diagnosis not present

## 2021-12-28 DIAGNOSIS — R972 Elevated prostate specific antigen [PSA]: Secondary | ICD-10-CM

## 2021-12-28 DIAGNOSIS — I3139 Other pericardial effusion (noninflammatory): Secondary | ICD-10-CM | POA: Diagnosis not present

## 2021-12-28 DIAGNOSIS — R000 Tachycardia, unspecified: Secondary | ICD-10-CM

## 2021-12-28 DIAGNOSIS — N179 Acute kidney failure, unspecified: Secondary | ICD-10-CM

## 2021-12-28 DIAGNOSIS — R6521 Severe sepsis with septic shock: Secondary | ICD-10-CM

## 2021-12-28 DIAGNOSIS — I639 Cerebral infarction, unspecified: Secondary | ICD-10-CM | POA: Diagnosis not present

## 2021-12-28 HISTORY — PX: COLONOSCOPY WITH PROPOFOL: SHX5780

## 2021-12-28 LAB — POCT I-STAT 7, (LYTES, BLD GAS, ICA,H+H)
Acid-base deficit: 3 mmol/L — ABNORMAL HIGH (ref 0.0–2.0)
Bicarbonate: 21 mmol/L (ref 20.0–28.0)
Calcium, Ion: 1.16 mmol/L (ref 1.15–1.40)
HCT: 17 % — ABNORMAL LOW (ref 39.0–52.0)
Hemoglobin: 5.8 g/dL — CL (ref 13.0–17.0)
O2 Saturation: 98 %
Patient temperature: 100.3
Potassium: 4 mmol/L (ref 3.5–5.1)
Sodium: 152 mmol/L — ABNORMAL HIGH (ref 135–145)
TCO2: 22 mmol/L (ref 22–32)
pCO2 arterial: 33.9 mmHg (ref 32–48)
pH, Arterial: 7.403 (ref 7.35–7.45)
pO2, Arterial: 107 mmHg (ref 83–108)

## 2021-12-28 LAB — BASIC METABOLIC PANEL
Anion gap: 5 (ref 5–15)
BUN: 46 mg/dL — ABNORMAL HIGH (ref 6–20)
CO2: 20 mmol/L — ABNORMAL LOW (ref 22–32)
Calcium: 7.4 mg/dL — ABNORMAL LOW (ref 8.9–10.3)
Chloride: 124 mmol/L — ABNORMAL HIGH (ref 98–111)
Creatinine, Ser: 1.87 mg/dL — ABNORMAL HIGH (ref 0.61–1.24)
GFR, Estimated: 41 mL/min — ABNORMAL LOW (ref >60)
Glucose, Bld: 143 mg/dL — ABNORMAL HIGH (ref 70–99)
Potassium: 4.2 mmol/L (ref 3.5–5.1)
Sodium: 149 mmol/L — ABNORMAL HIGH (ref 135–145)

## 2021-12-28 LAB — SURGICAL PATHOLOGY

## 2021-12-28 LAB — CBC
HCT: 22.1 % — ABNORMAL LOW (ref 39.0–52.0)
HCT: 23.8 % — ABNORMAL LOW (ref 39.0–52.0)
HCT: 22.4 % — ABNORMAL LOW (ref 39.0–52.0)
Hemoglobin: 7.1 g/dL — ABNORMAL LOW (ref 13.0–17.0)
Hemoglobin: 7.6 g/dL — ABNORMAL LOW (ref 13.0–17.0)
Hemoglobin: 6.8 g/dL — CL (ref 13.0–17.0)
MCH: 29.5 pg (ref 26.0–34.0)
MCH: 29.6 pg (ref 26.0–34.0)
MCH: 28.7 pg (ref 26.0–34.0)
MCHC: 32.1 g/dL (ref 30.0–36.0)
MCHC: 31.9 g/dL (ref 30.0–36.0)
MCHC: 30.4 g/dL (ref 30.0–36.0)
MCV: 91.7 fL (ref 80.0–100.0)
MCV: 92.6 fL (ref 80.0–100.0)
MCV: 94.5 fL (ref 80.0–100.0)
Platelets: 286 10*3/uL (ref 150–400)
Platelets: 332 10*3/uL (ref 150–400)
Platelets: 334 10*3/uL (ref 150–400)
RBC: 2.41 MIL/uL — ABNORMAL LOW (ref 4.22–5.81)
RBC: 2.57 MIL/uL — ABNORMAL LOW (ref 4.22–5.81)
RBC: 2.37 MIL/uL — ABNORMAL LOW (ref 4.22–5.81)
RDW: 16 % — ABNORMAL HIGH (ref 11.5–15.5)
RDW: 16 % — ABNORMAL HIGH (ref 11.5–15.5)
RDW: 16 % — ABNORMAL HIGH (ref 11.5–15.5)
WBC: 10.6 10*3/uL — ABNORMAL HIGH (ref 4.0–10.5)
WBC: 12.2 10*3/uL — ABNORMAL HIGH (ref 4.0–10.5)
WBC: 10.5 10*3/uL (ref 4.0–10.5)
nRBC: 0.2 % (ref 0.0–0.2)
nRBC: 0.2 % (ref 0.0–0.2)
nRBC: 0.5 % — ABNORMAL HIGH (ref 0.0–0.2)

## 2021-12-28 LAB — HEMOGLOBIN AND HEMATOCRIT, BLOOD
HCT: 21.5 % — ABNORMAL LOW (ref 39.0–52.0)
Hemoglobin: 6.8 g/dL — CL (ref 13.0–17.0)

## 2021-12-28 LAB — GLUCOSE, CAPILLARY
Glucose-Capillary: 103 mg/dL — ABNORMAL HIGH (ref 70–99)
Glucose-Capillary: 103 mg/dL — ABNORMAL HIGH (ref 70–99)
Glucose-Capillary: 128 mg/dL — ABNORMAL HIGH (ref 70–99)
Glucose-Capillary: 137 mg/dL — ABNORMAL HIGH (ref 70–99)
Glucose-Capillary: 154 mg/dL — ABNORMAL HIGH (ref 70–99)
Glucose-Capillary: 98 mg/dL (ref 70–99)

## 2021-12-28 LAB — PREPARE RBC (CROSSMATCH)

## 2021-12-28 LAB — VANCOMYCIN, RANDOM: Vancomycin Rm: 8

## 2021-12-28 SURGERY — COLONOSCOPY WITH PROPOFOL
Anesthesia: Moderate Sedation

## 2021-12-28 MED ORDER — MIDAZOLAM HCL (PF) 5 MG/ML IJ SOLN
INTRAMUSCULAR | Status: DC | PRN
  Administered 2021-12-28: 1 mg via INTRAVENOUS

## 2021-12-28 MED ORDER — FENTANYL CITRATE (PF) 100 MCG/2ML IJ SOLN
INTRAMUSCULAR | Status: AC
  Filled 2021-12-28: qty 2

## 2021-12-28 MED ORDER — ATORVASTATIN CALCIUM 40 MG PO TABS
40.0000 mg | ORAL_TABLET | Freq: Every day | ORAL | Status: DC
  Administered 2021-12-28 – 2022-01-15 (×19): 40 mg
  Filled 2021-12-28 (×20): qty 1

## 2021-12-28 MED ORDER — VANCOMYCIN HCL IN DEXTROSE 1-5 GM/200ML-% IV SOLN: 1000.0000 mg | INTRAVENOUS | Status: DC

## 2021-12-28 MED ORDER — PEG-KCL-NACL-NASULF-NA ASC-C 100 G PO SOLR
0.5000 | Freq: Once | ORAL | Status: AC
Start: 2021-12-28 — End: 2021-12-28
  Administered 2021-12-28: 100 g via ORAL
  Filled 2021-12-28: qty 1

## 2021-12-28 MED ORDER — VANCOMYCIN HCL 2000 MG/400ML IV SOLN
2000.0000 mg | Freq: Once | INTRAVENOUS | Status: DC
  Filled 2021-12-28: qty 400

## 2021-12-28 MED ORDER — CEFTRIAXONE SODIUM 2 G IJ SOLR
2.0000 g | INTRAMUSCULAR | Status: DC
  Administered 2021-12-28: 2 g via INTRAVENOUS
  Filled 2021-12-28: qty 20

## 2021-12-28 MED ORDER — VANCOMYCIN HCL 1250 MG/250ML IV SOLN
1250.0000 mg | INTRAVENOUS | Status: DC
  Filled 2021-12-28: qty 250

## 2021-12-28 MED ORDER — MIDAZOLAM HCL 2 MG/2ML IJ SOLN
1.0000 mg | INTRAMUSCULAR | Status: DC | PRN
  Administered 2021-12-28 (×2): 1 mg via INTRAVENOUS
  Administered 2021-12-28: 2 mg via INTRAVENOUS
  Administered 2021-12-29: 1 mg via INTRAVENOUS
  Filled 2021-12-28 (×4): qty 2

## 2021-12-28 MED ORDER — SODIUM CHLORIDE 0.9 % IV SOLN: INTRAVENOUS | Status: DC

## 2021-12-28 MED ORDER — SODIUM CHLORIDE 0.9% IV SOLUTION: Freq: Once | INTRAVENOUS | Status: AC

## 2021-12-28 MED ORDER — VANCOMYCIN HCL IN DEXTROSE 1-5 GM/200ML-% IV SOLN
1000.0000 mg | Freq: Once | INTRAVENOUS | Status: AC
  Administered 2021-12-28: 1000 mg via INTRAVENOUS
  Filled 2021-12-28: qty 200

## 2021-12-28 MED ORDER — PROPOFOL 1000 MG/100ML IV EMUL
INTRAVENOUS | Status: AC
  Filled 2021-12-28: qty 100

## 2021-12-28 MED ORDER — MIDAZOLAM HCL (PF) 5 MG/ML IJ SOLN
INTRAMUSCULAR | Status: AC
  Filled 2021-12-28: qty 1

## 2021-12-28 MED ORDER — NOREPINEPHRINE 4 MG/250ML-% IV SOLN
0.0000 ug/min | INTRAVENOUS | Status: DC
  Administered 2021-12-28: 2 ug/min via INTRAVENOUS
  Filled 2021-12-28: qty 250

## 2021-12-28 MED ORDER — PEG-KCL-NACL-NASULF-NA ASC-C 100 G PO SOLR
0.5000 | Freq: Once | ORAL | Status: AC
  Administered 2021-12-28: 100 g via ORAL
  Filled 2021-12-28: qty 1

## 2021-12-28 MED ORDER — PROPOFOL 1000 MG/100ML IV EMUL
5.0000 ug/kg/min | INTRAVENOUS | Status: DC
  Administered 2021-12-28: 30 ug/kg/min via INTRAVENOUS
  Administered 2021-12-28: 5 ug/kg/min via INTRAVENOUS
  Administered 2021-12-29: 40 ug/kg/min via INTRAVENOUS
  Administered 2021-12-29: 45 ug/kg/min via INTRAVENOUS
  Administered 2021-12-29: 50 ug/kg/min via INTRAVENOUS
  Administered 2021-12-30: 30 ug/kg/min via INTRAVENOUS
  Administered 2021-12-30: 40 ug/kg/min via INTRAVENOUS
  Filled 2021-12-28 (×10): qty 100

## 2021-12-28 MED ORDER — FENTANYL CITRATE PF 50 MCG/ML IJ SOSY
25.0000 ug | PREFILLED_SYRINGE | INTRAMUSCULAR | Status: DC | PRN
  Administered 2021-12-28: 25 ug via INTRAVENOUS
  Administered 2021-12-28: 50 ug via INTRAVENOUS
  Administered 2021-12-31 (×2): 25 ug via INTRAVENOUS
  Filled 2021-12-28 (×4): qty 1

## 2021-12-28 MED ORDER — PEG-KCL-NACL-NASULF-NA ASC-C 100 G PO SOLR: 1.0000 | Freq: Once | ORAL | Status: DC

## 2021-12-28 MED ORDER — FENTANYL CITRATE (PF) 100 MCG/2ML IJ SOLN
INTRAMUSCULAR | Status: DC | PRN
  Administered 2021-12-28 (×2): 25 ug via INTRAVENOUS

## 2021-12-28 SURGICAL SUPPLY — 22 items

## 2021-12-28 NOTE — Progress Notes (Addendum)
? ?NAME:  Vincent GowdaDavid Ferrell, MRN:  [id number], DOB:  Aug 01, 1961, LOS: 3 ?ADMISSION DATE:  12/25/2021, CONSULTATION DATE:  12/25/2021 ?REFERRING MD:  Dr. Corliss Skainseveshwar, CHIEF COMPLAINT:  left sided weakness  ? ?History of Present Illness:  ?HPI mostly obtained from medical chart review.  Attempted from patient but limited given aphasia.  ? ?61 year old male with prior history of HTN, HLD, and GERD who presented to ER as code stroke.  Patient was at the vet's office when he acutely developed left sided weakness and right gaze deviation at 0945.  Code stroke activated by EMS in addition to code STEMI based on diffuse STE.  Of note, patient recently diagnosed with RLL bronchopneumonia two days ago by his PCP.   ? ?He was evaluated by Neurology and Cardiology in ER, HR in 150s with no chest pain or dyspnea, with EKG not meeting criteria for STEMI but showed mild STE, felt possibly rate related versus possible pericarditis.  Echo and cardiac workup ordered.  CT head showed concern for subacute infarcts in the R PCA territory and two small areas possibly representing calcification or blood, therefore thrombolytics deferred.  CTA head/ neck and perfusion study showed acute right MCA anterior M2 occlusion.   ?Labs significant for WBC 20.6, Hgb 9.2, Hct 26.8, plts 205, normal coags, K 4.9, bicarb 21, glucose 138, BUN 34, sCr 1.83, iCa 1.03, low albumin/ protein, lactic acid 1.5. Trop hs pending.  CXR showing elevation of right hemidiaphragm with pulmonary vascular congestion.  Blood cultures sent.   Temp 100 in ER, remains ST, and initial BP 140/90, and remains on room air > 94%.  He was taken emergently to Neuro IR for mechanical thrombectomy with complete revascularization achieving TICI 3 revascularization.  Patient was extubated post intervention.  Briefly required phenylephrine in PACU.  Received 2L LR and albumin in procedure.  PCCM consulted for further medical assistance given concern for sepsis and possible cardioembolic  event related to stroke. ? ?Patient is currently only able to nod yes/ no to question given expressive aphasia and mostly incomprehensible speech.  Nodded yes to recent fever, cough with productive sputum, and headache.  Nods yes to recent bloody stools.  Did indicate that he started antibiotics from PCP.  Denied current or previous SOB, CP, palpitations, N/V/abd pain, lower leg swelling.   Patient indicates he lives alone, not married, no children, and has living parents out of state.  Non smoker, occasional but not daily ETOH, denies drug abuse.  ? ?Pertinent  Medical History  ?HLT, HLD, GERD, rising PSA level, impaired fasting glucose ? 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  Cards consulted for initial code STEMI with diffuse STE, no STEMI   ? ?Interim History / Subjective:  ? ?Large blood per rectum overnight. Hemoglobin stable though at 7.1 then 7.6.  ?Remains on Precedex.  ?Afebrile.  ? ?Objective   ?Blood pressure 98/65, pulse 66, temperature 99.3 ?F (37.4 ?C), temperature source Axillary, resp. rate (!) 30, height 5' 11.5" (1.816 m), weight 100.6 kg, SpO2 100 %. ?   ?Vent Mode: PRVC ?FiO2 (%):  [40 %] 40 % ?Set Rate:  [30 bmp] 30 bmp ?Vt Set:  [600 mL] 600 mL ?PEEP:  [5 cmH20] 5 cmH20 ?Plateau Pressure:  [17 cmH20-21 cmH20] 17 cmH20  ? ?Intake/Output Summary (Last 24 hours) at 12/28/2021 0930 ?Last data filed at 12/28/2021  0700 ?Gross per 24 hour  ?Intake 1022.48 ml  ?Output 1675 ml  ?Net -652.52 ml  ? ? ?Filed Weights  ? 12/25/21 1000 12/27/21 0500  ?Weight: 100.8 kg 100.6 kg  ? ?Examination: ?General: Critically ill-appearing middle-age male, lying on the bed, orally intubated ?HEENT: MM pink/dry, pupils 3/reactive, anicertic, left facial droop present, no obvious dental abnormalities, ETT/OGT in place ?Neuro: Following commands.  Answers questions with thumbs up or down. Able to move right arm and bilateral legs. Unable to move left arm. Sensation intact throughout.  ?CV: Regular rate and rhythm. No rub or murmurs.  ?PULM: Coarse breath sounds. No rales or rhonchi.  ?GI: soft, mildly distended , hypoactive bowel sounds present. ?Extremities: warm/dry, no LE edema ?Skin: see pictures.  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  ? ?Resolved Hospital Problem list   ? ?Assessment & Plan:  ? ?Acute R MCA, with M2 occlusion s/p mechanical thrombectomy with complete revascularization.  ?Continue neuro watch every hour ?Stroke team is following ?Continue secondary stroke prophylaxis ?SBP goal 120-140 ?Continue antiplatelet ?Start high intensity statin, Lipitor  ? ?Acute hypoxic respiratory failure ?Bilateral lower lobe pneumonia, likely aspiration  ?Continue lung protective ventilation ?Continue IV Unasyn for aspiration pneumonia ?Cultures have been negative so far ?VAP bundle ?PAD protocol with RASS goal 0/-1, currently on Precedex and fentanyl PRN ? ?Sepsis with septic shock, POA ?Suspected IE  ?Acute septic encephalopathy, resolved ?Patient is off vasopressors ?Continue IV antibiotics, Unasyn for aspiration pneumonia ?TTE is negative for vegetations, will work with Cardiology to obtained TEE to evaluate for endocarditis.  ?Hold off on broadening for possible culture negative endocarditis as clinically stable.  ? ?Diffuse mild STE ?Demand cardiac ischemia ?Suspected to be related to possible IE as above vs sepsis vs stress demand ?TEE pending ? ?AKI, due to septic ATN versus contrast-induced ?Renal function improving today. Good UOP over last 24 hours  ?Monitor intake and output ?Avoid nephrotoxic agents ?Daily BMP  ? ?Acute blood loss anemia s/p pRBC transfusion ?Lower GI bleeding ?Significant BRBPR overnight. Hemoglobin stable. No requiring pRBC over last 24 hours.   ?Appreciate GI input, plan for colonoscopy today at bedside ?Continue PPI BID ? ?Prediabetes with hyperglycemia ?Patient's hemoglobin A1c is 6 ?Continue sliding scale insulin with CBG goal 140-180 ? ?Ileus/partial small bowel obstruction ?No NGT output over last 24 hours documented  ?Holding tube feeds ? ?Best Practice (right click and "Reselect all SmartList Selections" daily)  ? ?Diet/type: NPO.  ?DVT prophylaxis: LMWH ?GI prophylaxis: PPI ?Lines: N/A ?Foley:  N/A ?Code Status:  full code ?Last date of multidisciplinary goals of care discussion [4/17, patient's sister was updated at bedside] ?  ?Labs   ?CBC: ?Recent Labs  ?Lab 12/25/21 ?1048 12/25/21 ?1315 12/25/21 ?1526 12/25/21 ?2319 12/26/21 ?0230 12/27/21 ?0032 12/28/21 ?0025 12/28/21 ?0453  ?WBC 20.0* 20.6*  --   --  22.3* 14.8* 10.6* 12.2*  ?NEUTROABS 16.3*  --   --   --  17.1*  --   --   --   ?HGB 11.8* 9.2*   < > 9.5* 9.6* 7.9* 7.1* 7.6*  ?HCT 35.0* 26.8*   < > 27.3* 27.2* 23.0* 22.1* 23.8*  ?MCV 91.1 91.5  --   --  85.5 88.5 91.7 92.6  ?PLT 199 205  --  187 202 205 286 332  ? < > = values in this interval not displayed.  ? ? ?Basic Metabolic Panel: ?Recent Labs  ?Lab 12/25/21 ?1923 12/25/21 ?  2027 12/25/21 ?2053 12/25/21 ?2209 12/25/21 ?2319 12/26/21 ?0230 12/27/21 ?0539 12/28/21 ?0025  ?NA 139   < > 140 137  --  140 143 149*  ?K 5.5*   < > 6.5* 5.7* 5.3* 4.9 4.6 4.2  ?CL 112*  --  113*  --   --  112* 113* 124*  ?CO2 21*  --  19*  --   --  20* 22 20*  ?GLUCOSE 242*  --  241*  --   --  299* 146* 143*  ?BUN 36*  --  40*  --   --  40* 53* 46*  ?CREATININE 1.73*  --  1.98*  --   --  1.99* 2.33* 1.87*  ?CALCIUM 6.8*  --  6.7*  --   --  6.6* 7.0* 7.4*  ? < > = values in this interval not displayed.  ? ? ?GFR: ?Estimated Creatinine Clearance: 51.2 mL/min (A) (by C-G formula based on SCr of 1.87 mg/dL (H)). ?Recent Labs  ?Lab 12/25/21 ?1315 12/25/21 ?1520 12/25/21 ?2053 12/26/21 ?0230 12/27/21 ?0032 12/28/21 ?0025 12/28/21 ?0453  ?PROCALCITON 2.95  --   --   --    --   --   --   ?WBC 20.6*  --   --  22.3* 14.8* 10.6* 12.2*  ?LATICACIDVEN 1.5 1.5 3.0*  --   --   --   --   ? ? ? ?Liver Function Tests: ?Recent Labs  ?Lab 12/25/21 ?1048 12/27/21 ?0032  ?AST 29 33  ?ALT 34 24  ?A

## 2021-12-28 NOTE — Progress Notes (Signed)
Initial Nutrition Assessment ? ?DOCUMENTATION CODES:  ? ?Not applicable ? ?INTERVENTION:  ? ?If medically feasible would recommend starting tube feeds via OG: ?Start Vital 1.5 @ 25 mL/hr and advance by 10 mL/hr until goal of 55 mL/hr is reached (1320 mL/hr) ?45 mL ProSource TF - BID ?Provides 2060 kcal, 111 grams of protein, and 1008 mL of free water daily.  ? ?NUTRITION DIAGNOSIS:  ? ?Inadequate oral intake related to inability to eat as evidenced by NPO status. ? ?GOAL:  ? ?Patient will meet greater than or equal to 90% of their needs ? ?MONITOR:  ? ?Vent status, Labs ? ?REASON FOR ASSESSMENT:  ? ?Ventilator ?  ? ?ASSESSMENT:  ? ?61 y.o. male presented to the ED with L side weakness, facial droop, and R gaze deviation. PMH includes HTN. Pt admitted with R MCA stroke with right MCA M2 occlusion and sepsis.  ? ?4/14 - intubated  ?4/17 - colonoscopy  ? ?Spoke with RN in room. RN reports that pt had recently returned from a colonoscopy; entire colon was filled with blood. Plan to repeat colonoscopy with EGD. OG has been to connected to LIWS, minimal output. Discussed with RN that TF recs will be left in note, if plan to start after testing is concluded. ? ?Patient is currently intubated on ventilator support. ?MV: 20 L/min ?MAP (a-line): 90 ?Temp (24hrs), Avg:99.4 ?F (37.4 ?C), Min:98.7 ?F (37.1 ?C), Max:100.3 ?F (37.9 ?C) ?Continuous Medications: ?Precedex ? ?Medications reviewed and include: Colace, SSI 0-15 units, Protonix, Miralax, IV antibiotics  ?Labs reviewed: Sodium 149, BUN 46, Creatinine 1.87, Hgb A1c 6%, 24 hr CBG 103-164 ? ?NUTRITION - FOCUSED PHYSICAL EXAM: ? ?Flowsheet Row Most Recent Value  ?Orbital Region No depletion  ?Upper Arm Region No depletion  ?Thoracic and Lumbar Region No depletion  ?Buccal Region No depletion  ?Temple Region Mild depletion  ?Clavicle Bone Region No depletion  ?Clavicle and Acromion Bone Region No depletion  ?Scapular Bone Region No depletion  ?Dorsal Hand No depletion   ?Patellar Region No depletion  ?Anterior Thigh Region No depletion  ?Posterior Calf Region No depletion  ?Edema (RD Assessment) None  ?Hair Reviewed  ?Eyes Reviewed  ?Mouth Unable to assess  ?Skin Reviewed  ?Nails Reviewed  ? ?Diet Order:   ?Diet Order   ? ?       ?  Diet NPO time specified  Diet effective now       ?  ? ?  ?  ? ?  ? ?EDUCATION NEEDS:  ? ?Not appropriate for education at this time ? ?Skin:  Skin Assessment: Reviewed RN Assessment ? ?Last BM:  4/16 ? ?Height:  ?Ht Readings from Last 1 Encounters:  ?12/25/21 5' 11.5" (1.816 m)  ? ?Weight:  ?Wt Readings from Last 1 Encounters:  ?12/27/21 100.6 kg  ? ?BMI:  Body mass index is 30.5 kg/m?. ? ?Estimated Nutritional Needs:  ? ?Kcal:  2200-2400 ? ?Protein:  110-125 grams ? ?Fluid:  >/= 2.2 L ? ? ? ?Kirby Crigler RD, LDN ?Clinical Dietitian ?See [facility] for contact information.  ? ?

## 2021-12-28 NOTE — Progress Notes (Signed)
? ? ?Referring Physician(s): ?Dr Roda Shutters ? ?Supervising Physician: Julieanne Cotton ? ?Patient Status:  [facility] - In-pt ? 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 ?Subjective: ? ?Still intubated this am but is awake and alert on vent.  ?Bloody bowel movements overnight.  Dark melena on bed linens during visit.  Planning for colonscopy with GI today as well as TEE for r/o endocarditis.  ? ?Allergies: ?Pollen extract ? ?Medications: ?Prior to Admission medications   ?Medication Sig Start Date End Date Taking? Authorizing Provider  ?aspirin EC 81 MG tablet Take 81 mg by mouth daily. Swallow whole.   Yes [provider]  ?benzonatate (TESSALON) 100 MG capsule Take 100 mg by mouth 3 (three) times daily as needed for cough. 12/22/21  Yes [provider]  ?doxycycline (VIBRAMYCIN) 100 MG capsule Take 100 mg by mouth 2 (two) times daily. 12/22/21  Yes [provider]  ?lisinopril (ZESTRIL) 5 MG tablet Take 5 mg by mouth daily. 12/03/21  Yes [provider]  ?loratadine (CLARITIN) 10 MG tablet Take 10 mg by mouth daily.   Yes [provider]  ?Omega-3 1000 MG CAPS Take 1,000 mg by mouth daily.   Yes [provider]  ?pantoprazole (PROTONIX) 20 MG tablet Take 20 mg by mouth daily. 12/03/21  Yes [provider]  ?simvastatin (ZOCOR) 40 MG tablet Take 40 mg by mouth at bedtime. 12/03/21  Yes [provider]  ? ? ? ?Vital Signs: ?BP 112/73   Pulse 85   Temp 99.3 ?F (37.4 ?C) (Axillary)   Resp (!) 30   Ht 5' 11.5" (1.816 m)   Wt 221 lb 12.5 oz (100.6 kg)   SpO2 100%   BMI 30.50 kg/m?  ? ?NAD, alert on vent ?Neuro: Communicative, giving thumbs up/down to answer questions.  Clearly able to demonstrate understanding. Following commands.  Ongoing left-sided weakness.  ?Groin: site stable, no oozing/hematoma.  ?Abdomen: dark melena on bed linens. ? ?Imaging: ?DG Abd 1 View ? ?Result Date: 12/25/2021 ?CLINICAL DATA:  Orogastric tube  placement. EXAM: ABDOMEN - 1 VIEW COMPARISON:  None. FINDINGS: An orogastric tube is seen with its distal tip overlying the expected region of the gastric antrum. The bowel gas pattern is normal. No radio-opaque calculi or other significant radiographic abnormality are seen. IMPRESSION: Orogastric tube positioning, as described above. Electronically Signed   By: Aram Candela M.D.   On: 12/25/2021 19:09  ? ?DG Abd 1 View ? ?Result Date: 12/25/2021 ?CLINICAL DATA:  OG tube placement. EXAM: ABDOMEN - 1 VIEW COMPARISON:  One view chest x-ray 12:45 p.m. FINDINGS: Gaseous distention of the stomach noted. Bowel gas pattern otherwise unremarkable. OG tube is not visualized over the distal esophagus or stomach. IMPRESSION: OG tube is not visualized over the distal esophagus or stomach. Gaseous distention of the stomach. Electronically Signed   By: Marin Roberts M.D.   On: 12/25/2021 19:06  ? ?MR ANGIO HEAD WO CONTRAST ? ?Result Date: 12/26/2021 ?CLINICAL DATA:  Acute infarct. Revascularization of superior right M2 segment. EXAM: MRA HEAD WITHOUT CONTRAST TECHNIQUE: Angiographic images of the Circle of Willis were acquired using MRA technique without intravenous contrast. COMPARISON:  CTA head and neck 12/25/2021.  Revascularization films. FINDINGS: Anterior circulation: The internal carotid arteries are within normal limits from high cervical segments through the ICA termini bilaterally. The A1 and M1 segments are normal. MCA bifurcations are within normal limits. MCA bifurcations are intact. The revascularized superior right M2 segments remain patent. ACA and  MCA branch vessels are normal bilaterally. Posterior circulation: The vertebral arteries are codominant. AICA vessels are dominant. The basilar artery is within normal limits. Right posterior cerebral artery originates from basilar tip. Left posterior cerebral artery is of fetal type. A left P1 segment contributes. PCA branch vessels are within normal limits  bilaterally. Anatomic variants: None Other: None. IMPRESSION: 1. Normal variant MRA Circle of Willis without evidence for significant proximal stenosis, aneurysm, or branch vessel occlusion. 2. The revascularized superior right M2 segments remain patent. Electronically Signed   By: Marin Roberts M.D.   On: 12/26/2021 16:54  ? ?MR BRAIN WO CONTRAST ? ?Result Date: 12/26/2021 ?CLINICAL DATA:  Status post revascularization of right superior M2 occlusion. EXAM: MRI HEAD WITHOUT CONTRAST TECHNIQUE: Multiplanar, multiecho pulse sequences of the brain and surrounding structures were obtained without intravenous contrast. COMPARISON:  CTA head and neck, CT perfusion, and revascularization images 12/25/21 FINDINGS: Brain: Diffusion-weighted images demonstrate acute hemorrhage within the superior right MCA division involving the right insular cortex and frontal lobe. Diffuse cortical infarct involves the high right parietal cortex. Sparing of the more inferior parietal lobe and superior temporal lobe noted. Focal area of nonhemorrhagic infarct is present in the right occipital pole. Contralateral left acute parietal and posterior left frontal lobe cortical infarcts are present as well. T2 hyperintensities are associated with the areas of acute/subacute infarction. Focal susceptibility involving the left parietal infarct noted. Additional areas of cortical hemorrhage are present in the high posterior right parietal lobe and in the right frontal operculum. Smaller foci of hemorrhage are present in the posterior left frontal infarct. Minimal white matter changes are present otherwise. The ventricles are of normal size. No significant extraaxial fluid collection is present. The internal auditory canals are within normal limits. The brainstem and cerebellum are within normal limits. Vascular: Flow is present in the major intracranial arteries. Skull and upper cervical spine: The craniocervical junction is normal. Upper  cervical spine is within normal limits. Marrow signal is unremarkable. Sinuses/Orbits: Bilateral mastoid effusions are present. There is some fluid in the posterior nasopharynx. OG tube and endotracheal tube are in place. The paranasal sinuses and mastoid air cells are otherwise clear. The globes and orbits are within normal limits. IMPRESSION: 1. 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 2. Contralateral acute/subacute cortical infarcts involving the left parietal and posterior left frontal lobe. 3. Focal area of acute/subacute infarct in the right occipital pole. 4. Focal cortical hemorrhage within scattered infarcts involving the left parietal and frontal lobes. Additional cortical hemorrhage in the high posterior right parietal lobe and frontal operculum. 5. Bilateral mastoid effusions likely secondary to intubation. Electronically Signed   By: Marin Roberts M.D.   On: 12/26/2021 16:52  ? ?CT CEREBRAL PERFUSION W CONTRAST ? ?Result Date: 12/25/2021 ?CLINICAL DATA:  Code stroke. 61 year old male with right MCA M2 ELVO. EXAM: CT PERFUSION BRAIN TECHNIQUE: Multiphase CT imaging of the brain was performed following IV bolus contrast injection. Subsequent parametric perfusion maps were calculated using RAPID software. RADIATION DOSE REDUCTION: This exam was performed according to the departmental dose-optimization program which includes automated exposure control, adjustment of the mA and/or kV according to patient size and/or use of iterative reconstruction technique. CONTRAST:  OMNIPAQUE IOHEXOL 350 MG/ML SOLN COMPARISON:  CTA the same day. FINDINGS: CT Brain Perfusion Findings: CBF (<30%) Volume: 45mL. 10 mL of detected CBV changes in the right MCA middle sylvian division territory. Perfusion (Tmax>6.0s) volume:  72mL. Hypoperfusion index of 0.4, 30 mL of T-max greater than 10 seconds in the  right MCA territory. Mismatch Volume: 32mL (mismatch ratio 1.8). ASPECTS on noncontrast CT Head: 10 at 1034 hours today. Infarction Location:Right MCA IMPRESSION: Right MCA territory ischemia. CT Perfusion estimates a core

## 2021-12-28 NOTE — Interval H&P Note (Signed)
History and Physical Interval Note: ? ?12/28/2021 ?2:29 PM ? ?Vincent Ferrell  has presented today for surgery, with the diagnosis of Hematochezia. Anemia..  The various methods of treatment have been discussed with the patient and family. After consideration of risks, benefits and other options for treatment, the patient has consented to  Transesophageal echocardiogram as a surgical intervention.  The patient's history has been reviewed, patient examined, no change in status, stable for surgery.  I have reviewed the patient's chart and labs.  Questions were answered to the patient's satisfaction.   ? ? ?Yadier Bramhall A Dov Dill ? ? ?

## 2021-12-28 NOTE — Progress Notes (Signed)
Patient having repeated peak pressure alarms post the TEE procedure.  RT changed all vent filters and lavage suctioned the patient without correcting the peak pressures.  CCM contacted and came to bedside.  Vent changed to PC 20, peep 5, RR 30, 40% by CCM.  RN starting additional sedation.  ?

## 2021-12-28 NOTE — Plan of Care (Signed)
? ? ?  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  After careful review of history and examination, the risks and benefits of transesophageal echocardiogram have been explained including risks of esophageal damage, perforation (1:10,000 risk), bleeding, pharyngeal hematoma as well as other potential complications associated with conscious sedation including aspiration, arrhythmia, respiratory failure and death. Alternatives to treatment were discussed, questions were answered. Patient's sister and POA is willing to proceed.   Vincent Ferrell.  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 ? ?Werner Lean, MD  ?12/28/2021 12:31 PM  ? ?

## 2021-12-28 NOTE — Op Note (Signed)
[facility] ?Patient Name: Vincent Ferrell ?Procedure Date : 12/28/2021 ?MRN: [id number] ?Attending MD: Tressia Danas MD, MD ?Date of Birth: [birth year] ?CSN: [id number] ?Age: 61 ?Admit Type: Inpatient ?Procedure:                Colonoscopy ?Indications:              Hematochezia ?Providers:                Tressia Danas MD, MD, Adolph Pollack, RN,  ?                          Rozetta Nunnery, Technician ?Referring MD:              ?Medicines:                Midazolam 1 mg IV, Fentanyl 50 micrograms IV ?Complications:            No immediate complications. ?Estimated Blood Loss:     Estimated blood loss: none. ?Procedure:                Pre-Anesthesia Assessment: ?                          - Prior to the procedure, a History and Physical  ?                          was performed, and patient medications and  ?                          allergies were reviewed. The patient's tolerance of  ?                          previous anesthesia was also reviewed. The risks  ?                          and benefits of the procedure and the sedation  ?                          options and risks were discussed with the patient.  ?                          All questions were answered, and informed consent  ?                          was obtained. Prior Anticoagulants: The patient has  ?                          taken no previous anticoagulant or antiplatelet  ?                          agents. ASA Grade Assessment: III - A patient with  ?                          severe systemic disease. After reviewing the risks  ?  and benefits, the patient was deemed in  ?                          satisfactory condition to undergo the procedure. ?                          After obtaining informed consent, the colonoscope  ?                          was passed under direct vision. Throughout the  ?                          procedure, the patient's blood pressure, pulse, and  ?                           oxygen saturations were monitored continuously. The  ?                          CF-HQ190L ([id number]) Olympus colonoscope was  ?                          introduced through the anus and advanced to the the  ?                          cecum, identified by appendiceal orifice and  ?                          ileocecal valve. Multiple views of the right colon  ?                          were performed with attempts to intubate the IC  ?                          valve. The colonoscopy was technically difficult  ?                          and complex due to inadequate bowel prep. The  ?                          patient tolerated the procedure well. The quality  ?                          of the bowel preparation was poor. ?Scope In: 10:46:41 AM ?Scope Out: 11:08:26 AM ?Scope Withdrawal Time: 0 hours 15 minutes 10 seconds  ?Total Procedure Duration: 0 hours 21 minutes 45 seconds  ?Findings: ?     The perianal and digital rectal examinations were normal. ?     Clotted blood and dark red liquid blood with stool was found in the  ?     entire colon. I was unable to intubate the IC valve to evaluate the  ?     terminal ileum due to looping and residual blood present in the cecum.  ?     Source of bleeding not identified on this study. ?Impression:               -  Preparation of the colon was poor. ?                          - Blood in the entire examined colon. ?                          - No specimens collected. ?Recommendation:           - Clear liquid diet today with additional bowel  ?                          prep. ?                          - Continue present medications. ?                          - Repeat colonoscopy tomorrow because the bowel  ?                          preparation was poor. ?                          - Will plan EGD tomorrow if the colonoscopy is  ?                          non-diagnostic. ?                          - Continue serial hgb/hct with transfusion as  ?                          indicated. ?                           - CTA with any significant overt bleeding in the  ?                          meantime. ?                          Results and recommendations discussed with the  ?                          patient following the procedure and with his sister  ?                          by telephone. ?Procedure Code(s):        --- Professional --- ?                          [phone], Colonoscopy, flexible; diagnostic, including  ?                          collection of specimen(s) by brushing or washing,  ?                          when performed (separate  procedure) ?Diagnosis Code(s):        --- Professional --- ?                          K92.2, Gastrointestinal hemorrhage, unspecified ?                          K92.1, Melena (includes Hematochezia) ?CPT copyright 2019 [employer]. All rights reserved. ?The codes documented in this report are preliminary and upon coder review may  ?be revised to meet current compliance requirements. ?Tressia DanasKimberly Idus Rathke MD, MD ?12/28/2021 11:41:05 AM ?This report has been signed electronically. ?Number of Addenda: 0 ?

## 2021-12-28 NOTE — TOC CAGE-AID Note (Signed)
Transition of Care (TOC) - CAGE-AID Screening ? ? ?Patient Details  ?Name: Vincent Ferrell ?MRN: [id number] ?Date of Birth: 04-19-1961 ? ?Transition of Care (TOC) CM/SW Contact:    ?Lan Mcneill C Tarpley-Carter, LCSWA ?Phone Number: ?12/28/2021, 12:54 PM ? ? ?Clinical Narrative: ?Pt is unable to participate in Cage Aid. ?Pt is intubated and on mechanical ventilator. ? ?Insurance underwriter, MSW, LCSW-A ?Pronouns:  She/Her/Hers ?[facility] of [facility] ?Clinical Social Worker ?Direct Number:  [phone] ?[email or web address] ? ?CAGE-AID Screening: ?Substance Abuse Screening unable to be completed due to: : Patient unable to participate ? ?  ?  ?  ?  ?  ? ?  ? ?  ? ? ? ? ? ? ?

## 2021-12-28 NOTE — Progress Notes (Signed)
Walked into pt's room around 0400 and notice a very large puddle of blood under pt's bed.  Blood came from pt's rectum, saturated entire bed, and pooled in the floor.  Pt received second dose of bowel prep at 0118 for his colonoscopy today.  RN called Elink via the room camera to lay eyes on the very large amount of blood.  Pt bathed and linens changed.  RN will draw another CBC to check Hgb.   ?

## 2021-12-28 NOTE — Progress Notes (Signed)
Patient ID: Vincent Ferrell, male   DOB: 1961/03/07, 61 y.o.   MRN: [id number] ? ? ? ? ? Progress Note ? ? Subjective  ? 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 ?Spoke with patient's nurse, bowel prep was completed, continues to have bloody stool ? ?Hemoglobin 7.1> 7.6 ?(patient has had 4 units of packed RBCs, 1 unit of FFP) ? ?Wife at bedside, patient has been complaining of crampy abdominal pain ? ? Objective  ? ?Vital signs in last 24 hours: ?Temp:  [98.7 ?F (37.1 ?C)-100.3 ?F (37.9 ?C)] 99.3 ?F (37.4 ?C) (04/17 0800) ?Pulse Rate:  [62-88] 66 (04/17 0759) ?Resp:  [30] 30 (04/17 0759) ?BP: (85-134)/(51-87) 98/65 (04/17 0759) ?SpO2:  [95 %-100 %] 100 % (04/17 0759) ?Arterial Line BP: (86-169)/(48-81) 155/69 (04/17 0500) ?FiO2 (%):  [40 %] 40 % (04/17 0759) ?Last BM Date : 12/27/21 ?General:    white male, intubated, alert communicating with hand signals appropriately ?Heart:  Regular rate and rhythm; no murmurs ?Lungs: Coarse breath sounds bilaterally ?Abdomen:  Soft, no focal tenderness nondistended.  Bowel sounds present ?Extremities:  Without edema. ?Neurologic:  Alert , intubated. ?Psych:  Cooperative.  ? ?Intake/Output from previous day: ?04/16 0701 - 04/17 0700 ?In: 1022.5 [I.V.:622.6; IV Piggyback:399.9] ?Out: 1675 [Urine:1575; Emesis/NG output:100] ?Intake/Output this shift: ?No intake/output data recorded. ? ?Lab Results: ?Recent Labs  ?  12/27/21 ?0032 12/28/21 ?0025 12/28/21 ?0453  ?WBC 14.8* 10.6* 12.2*  ?HGB 7.9* 7.1* 7.6*  ?HCT 23.0* 22.1* 23.8*  ?PLT 205 286 332  ? ?BMET ?Recent Labs  ?  12/26/21 ?0230 12/27/21 ?5732 12/28/21 ?0025  ?NA 140 143 149*  ?K 4.9 4.6 4.2  ?CL 112* 113* 124*  ?CO2 20* 22 20*  ?GLUCOSE 299* 146* 143*  ?BUN 40* 53* 46*  ?CREATININE 1.99* 2.33* 1.87*  ?CALCIUM 6.6* 7.0* 7.4*  ? ?LFT ?Recent Labs  ?  12/27/21 ?0032  ?PROT 4.2*  ?ALBUMIN 1.6*  ?AST 33  ?ALT 24  ?ALKPHOS  47  ?BILITOT 0.6  ?BILIDIR <0.1  ?IBILI NOT CALCULATED  ? ?PT/INR ?Recent Labs  ?  12/25/21 ?1048 12/25/21 ?2319  ?LABPROT 14.9 15.9*  ?INR 1.2 1.3*  ? ? ?Studies/Results: ?MR ANGIO HEAD WO CONTRAST ? ?Result Date: 12/26/2021 ?CLINICAL DATA:  Acute infarct. Revascularization of superior right M2 segment. EXAM: MRA HEAD WITHOUT CONTRAST TECHNIQUE: Angiographic images of the Circle of Willis were acquired using MRA technique without intravenous contrast. COMPARISON:  CTA head and neck 12/25/2021.  Revascularization films. FINDINGS: Anterior circulation: The internal carotid arteries are within normal limits from high cervical segments through the ICA termini bilaterally. The A1 and M1 segments are normal. MCA bifurcations are within normal limits. MCA bifurcations are intact. The revascularized superior right M2 segments remain patent. ACA and MCA branch vessels are normal bilaterally. Posterior circulation: The vertebral arteries are codominant. AICA vessels are dominant. The basilar artery is within normal limits. Right posterior cerebral artery originates from basilar tip. Left posterior cerebral artery is of fetal type. A left P1 segment contributes. PCA branch vessels are within normal limits bilaterally. Anatomic variants: None Other: None. IMPRESSION: 1. Normal variant MRA Circle of Willis without evidence for significant proximal stenosis, aneurysm, or branch vessel occlusion. 2. The revascularized superior right M2 segments remain patent. Electronically Signed   By: Marin Roberts M.D.   On: 12/26/2021 16:54  ? ?MR BRAIN WO CONTRAST ? ?Result Date: 12/26/2021 ?CLINICAL DATA:  Status post revascularization of  right superior M2 occlusion. EXAM: MRI HEAD WITHOUT CONTRAST TECHNIQUE: Multiplanar, multiecho pulse sequences of the brain and surrounding structures were obtained without intravenous contrast. COMPARISON:  CTA head and neck, CT perfusion, and revascularization images 12/25/21 FINDINGS: Brain:  Diffusion-weighted images demonstrate acute hemorrhage within the superior right MCA division involving the right insular cortex and frontal lobe. Diffuse cortical infarct involves the high right parietal cortex. Sparing of the more inferior parietal lobe and superior temporal lobe noted. Focal area of nonhemorrhagic infarct is present in the right occipital pole. Contralateral left acute parietal and posterior left frontal lobe cortical infarcts are present as well. T2 hyperintensities are associated with the areas of acute/subacute infarction. Focal susceptibility involving the left parietal infarct noted. Additional areas of cortical hemorrhage are present in the high posterior right parietal lobe and in the right frontal operculum. Smaller foci of hemorrhage are present in the posterior left frontal infarct. Minimal white matter changes are present otherwise. The ventricles are of normal size. No significant extraaxial fluid collection is present. The internal auditory canals are within normal limits. The brainstem and cerebellum are within normal limits. Vascular: Flow is present in the major intracranial arteries. Skull and upper cervical spine: The craniocervical junction is normal. Upper cervical spine is within normal limits. Marrow signal is unremarkable. Sinuses/Orbits: Bilateral mastoid effusions are present. There is some fluid in the posterior nasopharynx. OG tube and endotracheal tube are in place. The paranasal sinuses and mastoid air cells are otherwise clear. The globes and orbits are within normal limits. IMPRESSION: 1. 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 2. Contralateral acute/subacute cortical infarcts involving the left parietal and posterior left frontal lobe. 3. Focal area of acute/subacute infarct in the right occipital pole. 4. Focal cortical hemorrhage within scattered  infarcts involving the left parietal and frontal lobes. Additional cortical hemorrhage in the high posterior right parietal lobe and frontal operculum. 5. Bilateral mastoid effusions likely secondary to intubation. Electronically Signed   By: Marin Robertshristopher  Mattern M.D.   On: 12/26/2021 16:52  ? ?ECHOCARDIOGRAM COMPLETE ? ?Result Date: 12/26/2021 ?   ECHOCARDIOGRAM REPORT   Patient Name:   Franko Claudia PollockMACCHIAROLO Date of Exam: 12/26/2021 Medical Rec #:  [id number]         Height:       71.5 in Accession #:    [id number]        Weight:       222.2 lb Date of Birth:  August 07, 1961         BSA:          2.217 m? Patient Age:    60 years          BP:           103/65 mmHg Patient Gender: M                 HR:           103 bpm. Exam Location:  Inpatient Procedure: 2D Echo, Cardiac Doppler, Color Doppler and Intracardiac            Opacification Agent Indications:    Stroke  History:        Patient has no prior history of Echocardiogram examinations.                 Risk Factors:Hypertension and Dyslipidemia. GERD.  Sonographer:    Ross LudwigArthur Guy RDCS (AE) Referring Phys: [id number] [facility]  Sonographer Comments:  Echo performed with patient supine and on artificial respirator. Image acquisition challenging due to respiratory motion. IMPRESSIONS  1. Left ventricular ejection fraction, by estimation, is 55 to 60%. The left ventricle has normal function. The left ventricle has no regional wall motion abnormalities. Left ventricular diastolic parameters are indeterminate.  2. Right ventricular systolic function is normal. The right ventricular size is normal. Tricuspid regurgitation signal is inadequate for assessing PA pressure.  3. The pericardial effusion is circumferential.  4. The mitral valve is normal in structure. No evidence of mitral valve regurgitation. No evidence of mitral stenosis.  5. The aortic valve was not well visualized. Aortic valve regurgitation is not visualized. No aortic stenosis is present. FINDINGS  Left  Ventricle: Left ventricular ejection fraction, by estimation, is 55 to 60%. The left ventricle has normal function. The left ventricle has no regional wall motion abnormalities. Definity contrast agent was given IV to

## 2021-12-28 NOTE — Progress Notes (Signed)
Echocardiogram ?Echocardiogram Transesophageal has been performed. ? ?Warren Lacy Francis Yardley RDCS ?12/28/2021, 3:30 PM ?

## 2021-12-28 NOTE — Progress Notes (Addendum)
Pharmacy Antibiotic Note ? ?Vincent Ferrell is a 61 y.o. male admitted on 12/25/2021 with  Endocarditis .  Pharmacy has been consulted for Vancomycin dosing. ? ?Plan: ?Vancomycin 1000 mg IV x 1, followed by Vancomycin 1250 mg IV every 24 hours starting 4/18. Estimated AUC 521 mcg*h/mL. ?  ? ?Height: 5' 11.5" (181.6 cm) ?Weight: 100.6 kg (221 lb 12.5 oz) ?IBW/kg (Calculated) : 76.45 ? ?Temp (24hrs), Avg:99.6 ?F (37.6 ?C), Min:98.7 ?F (37.1 ?C), Max:100.3 ?F (37.9 ?C) ? ?Recent Labs  ?Lab 12/25/21 ?1315 12/25/21 ?1520 12/25/21 ?1923 12/25/21 ?2053 12/26/21 ?0230 12/27/21 ?0032 12/28/21 ?0025 12/28/21 ?0453  ?WBC 20.6*  --   --   --  22.3* 14.8* 10.6* 12.2*  ?CREATININE  --   --  1.73* 1.98* 1.99* 2.33* 1.87*  --   ?LATICACIDVEN 1.5 1.5  --  3.0*  --   --   --   --   ?  ?Estimated Creatinine Clearance: 51.2 mL/min (A) (by C-G formula based on SCr of 1.87 mg/dL (H)).   ? ?Allergies  ?Allergen Reactions  ? Pollen Extract Other (See Comments)  ?  Sneezing   ? ? ?Antimicrobials this admission: ?Vancomycin 4/14 >> 4/16 ?Rocephin 4/14 x1 & 4/17 >> ?Azithromycin 4/14 x1 ?Unasyn 4/15 >> 4/17 ? ?Microbiology results: ?4/14 BCx: ngtd x 3 days ?4/14 Trach asp (does not appear to be collected at this point in time) ?4/14 MRSA PCR: neg ? ?Thank you for allowing pharmacy to be a part of this patient?s care. ? ?Dani Anastasovites BS ?Pharmacy Student ?12/28/2021 6:06 PM ? ?"When you reach the end of your rope, tie a not in it and hang on" - Franklin D. Roosevelt ? ?Agree with the contents of this note ? ?Lasasha Brophy BS, PharmD, BCPS ?Clinical Pharmacist ?12/28/2021 6:32 PM ? ?Contact: [phone] after 3 PM ? ?"Be curious, not judgmental..." -Debbora Dus ? ?

## 2021-12-28 NOTE — Progress Notes (Signed)
PT Cancellation Note ? ?Patient Details ?Name: Vincent Ferrell ?MRN: [id number] ?DOB: 1961/01/27 ? ? ?Cancelled Treatment:    Reason Eval/Treat Not Completed: Medical issues which prohibited therapy- Per RN pt. Still having multiple bouts of uncontrolled bloody stool. Best to check back tomorrow with therapy. PT to follow up acutely as able.  ? ?Thermon Leyland, SPT ?Acute Rehab Services ? ? ? ?Thermon Leyland ?12/28/2021, 1:10 PM ?

## 2021-12-28 NOTE — Progress Notes (Signed)
eLink Physician-Brief Progress Note ?Patient Name: Rawley Harju ?DOB: 10/13/1960 ?MRN: [id number] ? ? ?Date of Service ? 12/28/2021  ?HPI/Events of Note ? Nursing reports another large bloody BM. Patient received bowel prep at  1:18 AM for colonoscopy in the morning. Hgb at 12:25 AM = 7.1. BP = 153/75 and HR = 80. Another CBC is ordered for 5 AM.  ?eICU Interventions ? Await 5 AM CBC results.   ? ? ? ?Intervention Category ?Major Interventions: Other: ? ?Jerlisa Diliberto Dennard Nip ?12/28/2021, 4:31 AM ?

## 2021-12-28 NOTE — Progress Notes (Signed)
STROKE TEAM PROGRESS NOTE  ? ?SUBJECTIVE (INTERVAL HISTORY) ?His girlfriend and RN are at the bedside.  Patient still intubated, eyes half way opening today, continues to follow all simple commands.  Patient afebrile, TEE planned in pm. Overnight still has bloody stool, had colonoscopy today but inconclusive due to poor bowel prep. Will plan to repeat in am.  ? ?OBJECTIVE ?Temp:  [98.7 ?F (37.1 ?C)-100.3 ?F (37.9 ?C)] 99.7 ?F (37.6 ?C) (04/17 1200) ?Pulse Rate:  [60-88] 73 (04/17 1300) ?Cardiac Rhythm: Normal sinus rhythm (04/17 0800) ?Resp:  [20-31] 30 (04/17 1300) ?BP: (85-134)/(51-87) 110/70 (04/17 1300) ?SpO2:  [95 %-100 %] 98 % (04/17 1300) ?Arterial Line BP: (86-169)/(48-81) 155/69 (04/17 0500) ?FiO2 (%):  [40 %] 40 % (04/17 1148) ? ?Recent Labs  ?Lab 12/27/21 ?1934 12/27/21 ?2333 12/28/21 ?[id number] 12/28/21 ?[id number] 12/28/21 ?1201  ?GLUCAP 164* 122* 128* 103* 103*  ? ?Recent Labs  ?Lab 12/25/21 ?1923 12/25/21 ?2027 12/25/21 ?2053 12/25/21 ?2209 12/25/21 ?2319 12/26/21 ?0230 12/27/21 ?[id number] 12/28/21 ?0025  ?NA 139   < > 140 137  --  140 143 149*  ?K 5.5*   < > 6.5* 5.7* 5.3* 4.9 4.6 4.2  ?CL 112*  --  113*  --   --  112* 113* 124*  ?CO2 21*  --  19*  --   --  20* 22 20*  ?GLUCOSE 242*  --  241*  --   --  299* 146* 143*  ?BUN 36*  --  40*  --   --  40* 53* 46*  ?CREATININE 1.73*  --  1.98*  --   --  1.99* 2.33* 1.87*  ?CALCIUM 6.8*  --  6.7*  --   --  6.6* 7.0* 7.4*  ? < > = values in this interval not displayed.  ? ?Recent Labs  ?Lab 12/25/21 ?1048 12/27/21 ?0032  ?AST 29 33  ?ALT 34 24  ?ALKPHOS 100 47  ?BILITOT 0.5 0.6  ?PROT 5.7* 4.2*  ?ALBUMIN 2.1* 1.6*  ? ?Recent Labs  ?Lab 12/25/21 ?1048 12/25/21 ?1315 12/25/21 ?1526 12/25/21 ?2319 12/26/21 ?0230 12/27/21 ?0032 12/28/21 ?0025 12/28/21 ?0453  ?WBC 20.0* 20.6*  --   --  22.3* 14.8* 10.6* 12.2*  ?NEUTROABS 16.3*  --   --   --  17.1*  --   --   --   ?HGB 11.8* 9.2*   < > 9.5* 9.6* 7.9* 7.1* 7.6*  ?HCT 35.0* 26.8*   < > 27.3* 27.2* 23.0* 22.1* 23.8*  ?MCV 91.1 91.5   --   --  85.5 88.5 91.7 92.6  ?PLT 199 205  --  187 202 205 286 332  ? < > = values in this interval not displayed.  ? ?No results for input(s): CKTOTAL, CKMB, CKMBINDEX, TROPONINI in the last 168 hours. ?Recent Labs  ?  12/25/21 ?2319  ?LABPROT 15.9*  ?INR 1.3*  ? ?No results for input(s): COLORURINE, LABSPEC, PHURINE, GLUCOSEU, HGBUR, BILIRUBINUR, KETONESUR, PROTEINUR, UROBILINOGEN, NITRITE, LEUKOCYTESUR in the last 72 hours. ? ?Invalid input(s): APPERANCEUR ?  ?   ?Component Value Date/Time  ? CHOL 89 12/26/2021 0230  ? TRIG 214 (H) 12/26/2021 0230  ? HDL <10 (L) 12/26/2021 0230  ? CHOLHDL NOT CALCULATED 12/26/2021 0230  ? VLDL 43 (H) 12/26/2021 0230  ? LDLCALC NOT CALCULATED 12/26/2021 0230  ? ?Lab Results  ?Component Value Date  ? HGBA1C 6.0 (H) 12/25/2021  ? ?   ?Component Value Date/Time  ? LABOPIA NONE DETECTED 12/26/2021 0116  ? COCAINSCRNUR NONE DETECTED  12/26/2021 0116  ? LABBENZ NONE DETECTED 12/26/2021 0116  ? AMPHETMU NONE DETECTED 12/26/2021 0116  ? THCU NONE DETECTED 12/26/2021 0116  ? LABBARB NONE DETECTED 12/26/2021 0116  ?  ?No results for input(s): ETH in the last 168 hours. ? ?I have personally reviewed the radiological images below and agree with the radiology interpretations. ? ?DG Abd 1 View ? ?Result Date: 12/25/2021 ?CLINICAL DATA:  Orogastric tube placement. EXAM: ABDOMEN - 1 VIEW COMPARISON:  None. FINDINGS: An orogastric tube is seen with its distal tip overlying the expected region of the gastric antrum. The bowel gas pattern is normal. No radio-opaque calculi or other significant radiographic abnormality are seen. IMPRESSION: Orogastric tube positioning, as described above. Electronically Signed   By: Aram Candela M.D.   On: 12/25/2021 19:09  ? ?DG Abd 1 View ? ?Result Date: 12/25/2021 ?CLINICAL DATA:  OG tube placement. EXAM: ABDOMEN - 1 VIEW COMPARISON:  One view chest x-ray 12:45 p.m. FINDINGS: Gaseous distention of the stomach noted. Bowel gas pattern otherwise unremarkable. OG  tube is not visualized over the distal esophagus or stomach. IMPRESSION: OG tube is not visualized over the distal esophagus or stomach. Gaseous distention of the stomach. Electronically Signed   By: Marin Roberts M.D.   On: 12/25/2021 19:06  ? ?MR ANGIO HEAD WO CONTRAST ? ?Result Date: 12/26/2021 ?CLINICAL DATA:  Acute infarct. Revascularization of superior right M2 segment. EXAM: MRA HEAD WITHOUT CONTRAST TECHNIQUE: Angiographic images of the Circle of Willis were acquired using MRA technique without intravenous contrast. COMPARISON:  CTA head and neck 12/25/2021.  Revascularization films. FINDINGS: Anterior circulation: The internal carotid arteries are within normal limits from high cervical segments through the ICA termini bilaterally. The A1 and M1 segments are normal. MCA bifurcations are within normal limits. MCA bifurcations are intact. The revascularized superior right M2 segments remain patent. ACA and MCA branch vessels are normal bilaterally. Posterior circulation: The vertebral arteries are codominant. AICA vessels are dominant. The basilar artery is within normal limits. Right posterior cerebral artery originates from basilar tip. Left posterior cerebral artery is of fetal type. A left P1 segment contributes. PCA branch vessels are within normal limits bilaterally. Anatomic variants: None Other: None. IMPRESSION: 1. Normal variant MRA Circle of Willis without evidence for significant proximal stenosis, aneurysm, or branch vessel occlusion. 2. The revascularized superior right M2 segments remain patent. Electronically Signed   By: Marin Roberts M.D.   On: 12/26/2021 16:54  ? ?MR BRAIN WO CONTRAST ? ?Result Date: 12/26/2021 ?CLINICAL DATA:  Status post revascularization of right superior M2 occlusion. EXAM: MRI HEAD WITHOUT CONTRAST TECHNIQUE: Multiplanar, multiecho pulse sequences of the brain and surrounding structures were obtained without intravenous contrast. COMPARISON:  CTA head and  neck, CT perfusion, and revascularization images 12/25/21 FINDINGS: Brain: Diffusion-weighted images demonstrate acute hemorrhage within the superior right MCA division involving the right insular cortex and frontal lobe. Diffuse cortical infarct involves the high right parietal cortex. Sparing of the more inferior parietal lobe and superior temporal lobe noted. Focal area of nonhemorrhagic infarct is present in the right occipital pole. Contralateral left acute parietal and posterior left frontal lobe cortical infarcts are present as well. T2 hyperintensities are associated with the areas of acute/subacute infarction. Focal susceptibility involving the left parietal infarct noted. Additional areas of cortical hemorrhage are present in the high posterior right parietal lobe and in the right frontal operculum. Smaller foci of hemorrhage are present in the posterior left frontal infarct. Minimal white matter changes are present  otherwise. The ventricles are of normal size. No significant extraaxial fluid collection is present. The internal auditory canals are within normal limits. The brainstem and cerebellum are within normal limits. Vascular: Flow is present in the major intracranial arteries. Skull and upper cervical spine: The craniocervical junction is normal. Upper cervical spine is within normal limits. Marrow signal is unremarkable. Sinuses/Orbits: Bilateral mastoid effusions are present. There is some fluid in the posterior nasopharynx. OG tube and endotracheal tube are in place. The paranasal sinuses and mastoid air cells are otherwise clear. The globes and orbits are within normal limits. IMPRESSION: 1. 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 2. Contralateral acute/subacute cortical infarcts involving the left parietal and posterior left frontal lobe. 3. Focal area of acute/subacute infarct in the  right occipital pole. 4. Focal cortical hemorrhage within scattered infarcts involving the left parietal and frontal lobes. Additional cortical hemorrhage in the high posterior right parietal lobe and f

## 2021-12-28 NOTE — CV Procedure (Addendum)
? ? ?  TRANSESOPHAGEAL ECHOCARDIOGRAM  ? ?NAME:  Noel Rodier    ?MRN: [id number] ?DOB:  11-14-1960    ?ADMIT DATE: 12/25/2021 ? ?INDICATIONS: ?Bacteremia, Stroke ? ?PROCEDURE:  ? ?Informed consent was obtained prior to the procedure. The risks, benefits and alternatives for the procedure were discussed and the patient comprehended these risks.  Risks include, but are not limited to, cough, sore throat, vomiting, nausea, somnolence, esophageal and stomach trauma or perforation, bleeding, low blood pressure, aspiration, pneumonia, infection, trauma to the teeth and death.   ? ?Procedural time out performed. The oropharynx was anesthetized with topical 1% benzocaine.   ? ?Anesthesia was administered by myself- 75 mg Fentanyl and 3 mg Versed was given to achieve and maintain moderate  conscious sedation.  The patient's heart rate, blood pressure, and oxygen saturation are monitored continuously during the procedure. The period of conscious sedation is 20 minutes, of which I was present face-to-face 100% of this time.  ? ?The transesophageal probe was inserted in the esophagus and stomach without difficulty and multiple views were obtained.  ? ?COMPLICATIONS:   ? ?There were no immediate complications. ? ?KEY FINDINGS: ? ?Mitral valve infective endocarditis. ?Small Pericardial effusion.  ?Full report to follow. ?Further management per primary team.  We have attempt to reach patients POA, his sister Vickki Hearing, at [phone].  She has a voicemail that is full and is not accepting new messages. ?Addendum- called sister, able to reach her.  Updated her on the above that the increased peak pressures noted by RT. ? ?Riley Lam, MD ?[place]  [facility]  ?3:13 PM ? ? ? ?

## 2021-12-28 NOTE — Progress Notes (Signed)
OT Cancellation Note ? ?Patient Details ?Name: Vincent Ferrell ?MRN: [id number] ?DOB: 1960-10-02 ? ? ?Cancelled Treatment:    Reason Eval/Treat Not Completed: Medical issues which prohibited therapy (Per RN pt. Still having multiple bouts of uncontrolled bloody stool. Best to check back tomorrow with therapy. OT treat to f/u as appropriate) ? ?Kellie Murrill A Dietrich Ke ?12/28/2021, 1:17 PM ?

## 2021-12-28 NOTE — Progress Notes (Addendum)
eLink Physician-Brief Progress Note ?Patient Name: Vincent Ferrell ?DOB: [birth year] ?MRN: [id number] ? ? ?Date of Service ? 12/28/2021  ?HPI/Events of Note ? Large amount of blood per rectum. BP = 157/65. HR = 75.  ?eICU Interventions ? Plan: ?Please send CBC ordered for AM now.   ? ? ? ?Intervention Category ?Major Interventions: Other: ? ?Monnie Anspach Cornelia Copa ?12/28/2021, 12:11 AM ?

## 2021-12-28 NOTE — Progress Notes (Addendum)
Interval Progress Note:  ? ?Bedside TEE preliminary results with evidence of MV vegetation. Consulted ID and discussed with Dr. Earlene Plater. Will broaden antibiotics to Vancomycin and Rocephin and repeat blood cultures. Will await final echo results prior to deciding on TCTS consultation.  ? ?Of note, patient did have a teeth cleaning within the last month. No other dental procedures. Last colonoscopy in 2014, reportedly normal per patient.  ? ?Dr. Verdene Lennert ?Internal Medicine PGY-3  ?12/28/2021, 3:49 PM ?

## 2021-12-28 NOTE — Progress Notes (Signed)
SLP Cancellation Note ? ?Patient Details ?Name: Vincent Ferrell ?MRN: [id number] ?DOB: Nov 10, 1960 ? ? ?Cancelled treatment:       Reason Eval/Treat Not Completed: Medical issues which prohibited therapy (Pt remains on the vent at this time. SLP will follow up.) ? ? ?Samone Guhl I. Vear Clock, MS, CCC-SLP ?Acute Rehabilitation Services ?Office number [phone] ?Pager [phone] ? ?[name] ?12/28/2021, 8:05 AM ?

## 2021-12-29 ENCOUNTER — Encounter (HOSPITAL_COMMUNITY)
Admission: EM | Disposition: A | Discharge status: Nursing Home (Includes ICF) | Payer: Self-pay | Source: Home / Self Care | Attending: Neurology

## 2021-12-29 ENCOUNTER — Inpatient Hospital Stay (HOSPITAL_COMMUNITY): Payer: Managed Care, Other (non HMO)

## 2021-12-29 ENCOUNTER — Inpatient Hospital Stay (HOSPITAL_COMMUNITY): Payer: Managed Care, Other (non HMO) | Admitting: Anesthesiology

## 2021-12-29 DIAGNOSIS — I059 Rheumatic mitral valve disease, unspecified: Secondary | ICD-10-CM

## 2021-12-29 DIAGNOSIS — N412 Abscess of prostate: Secondary | ICD-10-CM

## 2021-12-29 DIAGNOSIS — I76 Septic arterial embolism: Secondary | ICD-10-CM | POA: Diagnosis not present

## 2021-12-29 DIAGNOSIS — J181 Lobar pneumonia, unspecified organism: Secondary | ICD-10-CM

## 2021-12-29 DIAGNOSIS — J9601 Acute respiratory failure with hypoxia: Secondary | ICD-10-CM | POA: Diagnosis present

## 2021-12-29 DIAGNOSIS — K317 Polyp of stomach and duodenum: Secondary | ICD-10-CM

## 2021-12-29 DIAGNOSIS — K921 Melena: Secondary | ICD-10-CM

## 2021-12-29 DIAGNOSIS — I6601 Occlusion and stenosis of right middle cerebral artery: Secondary | ICD-10-CM | POA: Diagnosis not present

## 2021-12-29 DIAGNOSIS — I63511 Cerebral infarction due to unspecified occlusion or stenosis of right middle cerebral artery: Secondary | ICD-10-CM | POA: Diagnosis not present

## 2021-12-29 DIAGNOSIS — K3189 Other diseases of stomach and duodenum: Secondary | ICD-10-CM

## 2021-12-29 DIAGNOSIS — I330 Acute and subacute infective endocarditis: Secondary | ICD-10-CM

## 2021-12-29 HISTORY — PX: TRANSURETHRAL RESECTION OF PROSTATE: SHX73

## 2021-12-29 HISTORY — PX: ESOPHAGOGASTRODUODENOSCOPY (EGD) WITH PROPOFOL: SHX5813

## 2021-12-29 HISTORY — PX: COLONOSCOPY WITH PROPOFOL: SHX5780

## 2021-12-29 LAB — GLUCOSE, CAPILLARY
Glucose-Capillary: 101 mg/dL — ABNORMAL HIGH (ref 70–99)
Glucose-Capillary: 120 mg/dL — ABNORMAL HIGH (ref 70–99)
Glucose-Capillary: 121 mg/dL — ABNORMAL HIGH (ref 70–99)
Glucose-Capillary: 127 mg/dL — ABNORMAL HIGH (ref 70–99)
Glucose-Capillary: 140 mg/dL — ABNORMAL HIGH (ref 70–99)
Glucose-Capillary: 83 mg/dL (ref 70–99)

## 2021-12-29 LAB — TYPE AND SCREEN
ABO/RH(D): O POS
Antibody Screen: NEGATIVE
Unit division: 0
Unit division: 0
Unit division: 0
Unit division: 0
Unit division: 0
Unit division: 0
Unit division: 0
Unit division: 0

## 2021-12-29 LAB — BASIC METABOLIC PANEL
Anion gap: 8 (ref 5–15)
BUN: 36 mg/dL — ABNORMAL HIGH (ref 6–20)
CO2: 21 mmol/L — ABNORMAL LOW (ref 22–32)
Calcium: 7.9 mg/dL — ABNORMAL LOW (ref 8.9–10.3)
Chloride: 125 mmol/L — ABNORMAL HIGH (ref 98–111)
Creatinine, Ser: 1.81 mg/dL — ABNORMAL HIGH (ref 0.61–1.24)
GFR, Estimated: 42 mL/min — ABNORMAL LOW (ref >60)
Glucose, Bld: 131 mg/dL — ABNORMAL HIGH (ref 70–99)
Potassium: 3.6 mmol/L (ref 3.5–5.1)
Sodium: 154 mmol/L — ABNORMAL HIGH (ref 135–145)

## 2021-12-29 LAB — BPAM RBC
Blood Product Expiration Date: 202305112359
Blood Product Expiration Date: 202305112359
Blood Product Expiration Date: 202305112359
Blood Product Expiration Date: 202305112359
Blood Product Expiration Date: 202305122359
Blood Product Expiration Date: 202305122359
Blood Product Expiration Date: 202305182359
Blood Product Expiration Date: 202305182359
ISSUE DATE / TIME: 202304141845
ISSUE DATE / TIME: 202304142119
ISSUE DATE / TIME: 202304142119
ISSUE DATE / TIME: 202304142119
ISSUE DATE / TIME: 202304171734
ISSUE DATE / TIME: 202304171734
ISSUE DATE / TIME: 202304180343
ISSUE DATE / TIME: 202304180343
Unit Type and Rh: 5100
Unit Type and Rh: 5100
Unit Type and Rh: 5100
Unit Type and Rh: 5100
Unit Type and Rh: 5100
Unit Type and Rh: 5100
Unit Type and Rh: 5100
Unit Type and Rh: 5100

## 2021-12-29 LAB — BPAM FFP
Blood Product Expiration Date: 202304222359
Blood Product Expiration Date: 202304222359
ISSUE DATE / TIME: 202304171735
ISSUE DATE / TIME: 202304171735
Unit Type and Rh: 5100
Unit Type and Rh: 9500

## 2021-12-29 LAB — CBC
HCT: 26.3 % — ABNORMAL LOW (ref 39.0–52.0)
Hemoglobin: 8.4 g/dL — ABNORMAL LOW (ref 13.0–17.0)
MCH: 29.8 pg (ref 26.0–34.0)
MCHC: 31.9 g/dL (ref 30.0–36.0)
MCV: 93.3 fL (ref 80.0–100.0)
Platelets: 352 10*3/uL (ref 150–400)
RBC: 2.82 MIL/uL — ABNORMAL LOW (ref 4.22–5.81)
RDW: 16.3 % — ABNORMAL HIGH (ref 11.5–15.5)
WBC: 10.4 10*3/uL (ref 4.0–10.5)
nRBC: 0.5 % — ABNORMAL HIGH (ref 0.0–0.2)

## 2021-12-29 LAB — PREPARE FRESH FROZEN PLASMA: Unit division: 0

## 2021-12-29 LAB — HEMOGLOBIN AND HEMATOCRIT, BLOOD
HCT: 28.7 % — ABNORMAL LOW (ref 39.0–52.0)
HCT: 28.4 % — ABNORMAL LOW (ref 39.0–52.0)
HCT: 30.2 % — ABNORMAL LOW (ref 39.0–52.0)
Hemoglobin: 9.3 g/dL — ABNORMAL LOW (ref 13.0–17.0)
Hemoglobin: 9.5 g/dL — ABNORMAL LOW (ref 13.0–17.0)
Hemoglobin: 9.3 g/dL — ABNORMAL LOW (ref 13.0–17.0)

## 2021-12-29 LAB — TRIGLYCERIDES: Triglycerides: 222 mg/dL — ABNORMAL HIGH (ref <150)

## 2021-12-29 IMAGING — MR MR PELVIS WO/W CM
4 of 10 series · 19 of 48 positions shown · IV contrast (17 MH)
Comparison: CT [DATE]

CLINICAL DATA: Prostatitis concern for abscess.

EXAM:
MRI PELVIS WITHOUT AND WITH CONTRAST
TECHNIQUE: Multiplanar multisequence MR imaging of the pelvis was performed
both before and after administration of intravenous contrast.
CONTRAST:  10mL GADAVIST GADOBUTROL 1 MMOL/ML IV SOLN

[Series 4: T2 · axial · 5.0mm · 0.47mm/px · z∈[-56,+184]mm · 6 of 41 slices shown (1 of 2)]
[im 1/41]
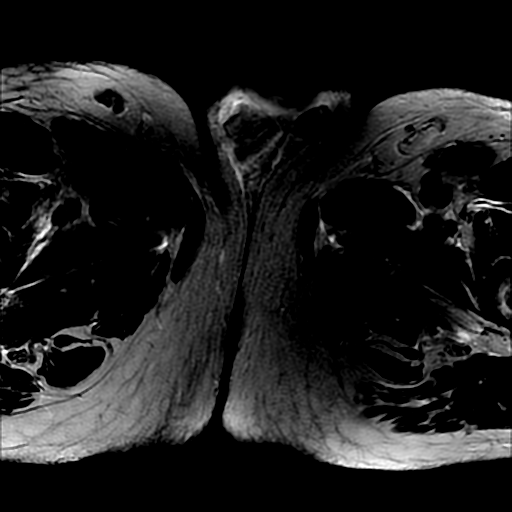
[im 9/41]
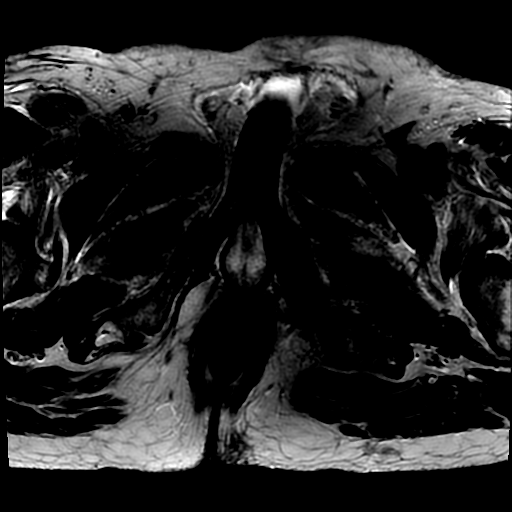
[im 17/41]
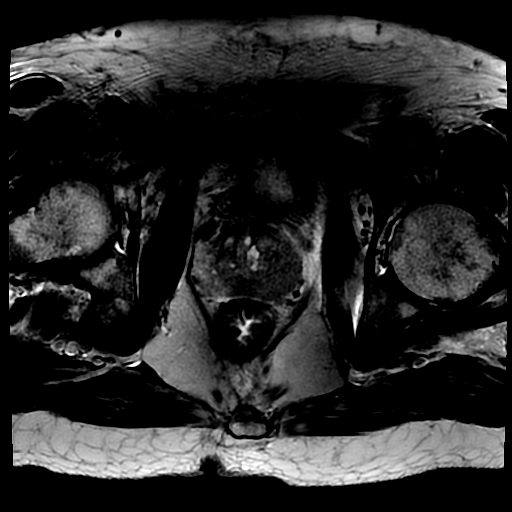
[im 25/41]
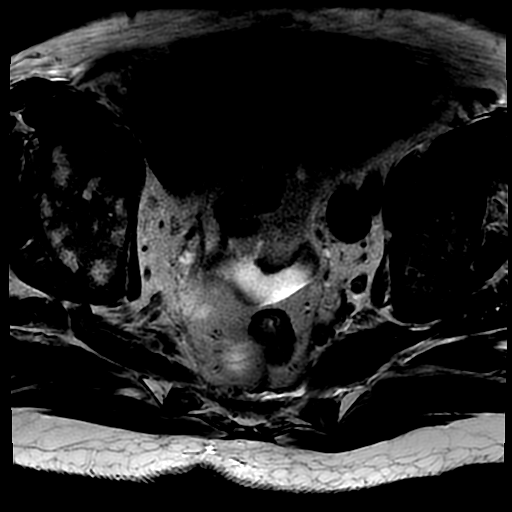
[im 33/41]
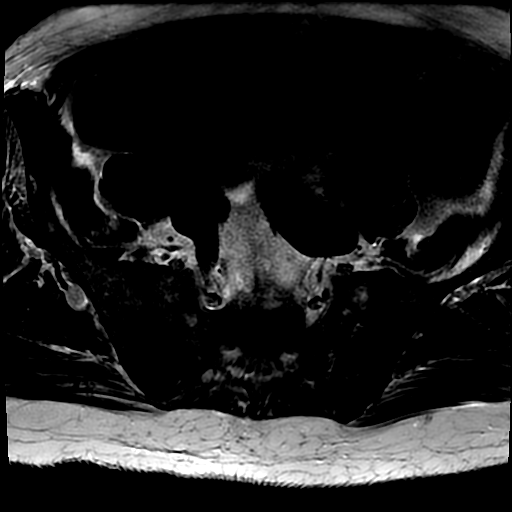
[im 41/41]
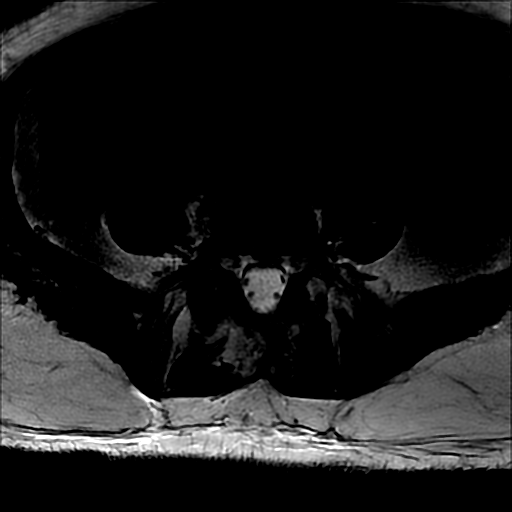

[Series 5: T2 fat-sat · axial · 5.0mm · 0.47mm/px · z∈[-56,+184]mm · 5 of 41 slices shown]
[im 1/41]
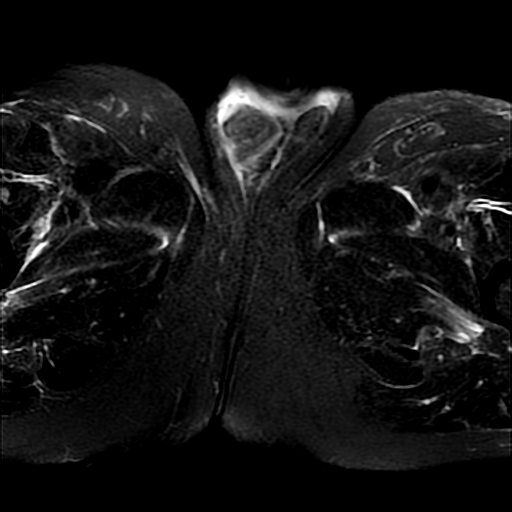
[im 11/41]
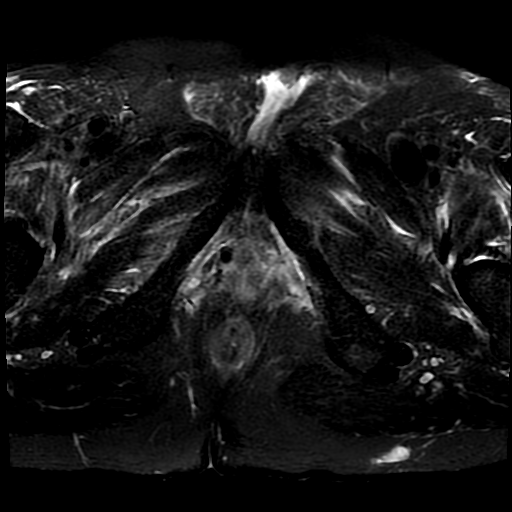
[im 21/41]
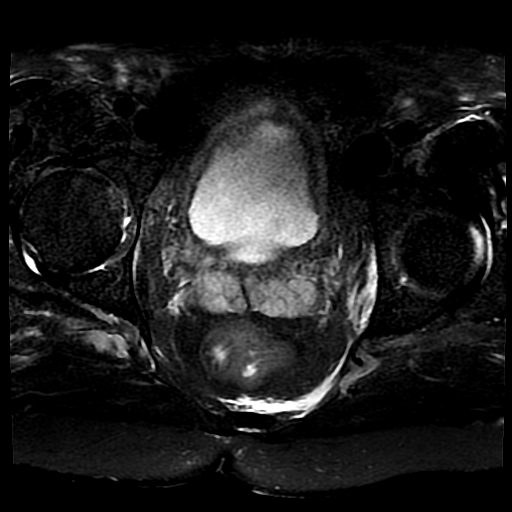
[im 31/41]
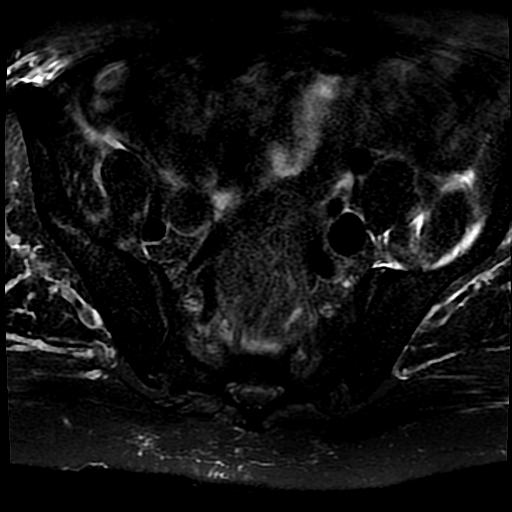
[im 41/41]
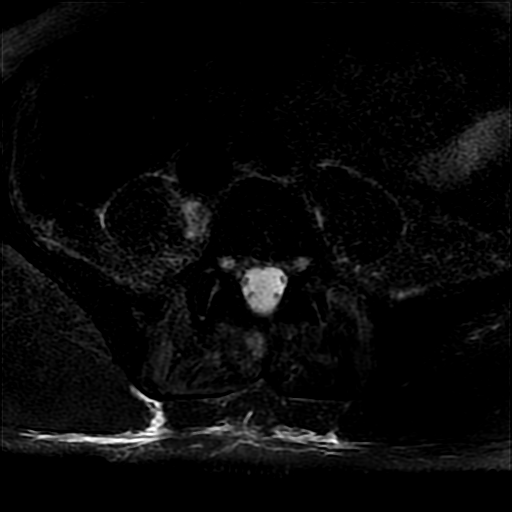

[Series 6: T2 · sagittal · 5.0mm · 0.51mm/px · 5 of 39 slices shown (2 of 2)]
[im 1/39]
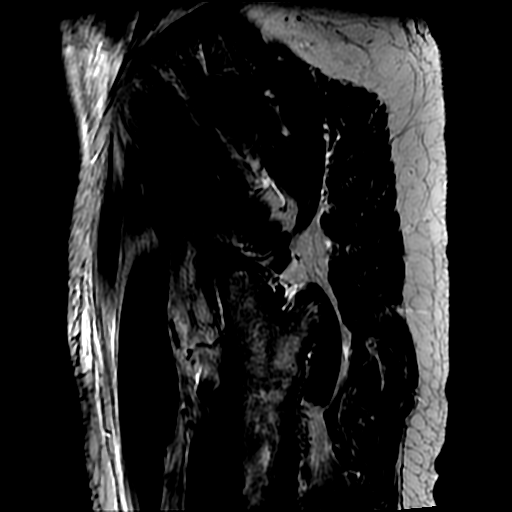
[im 10/39]
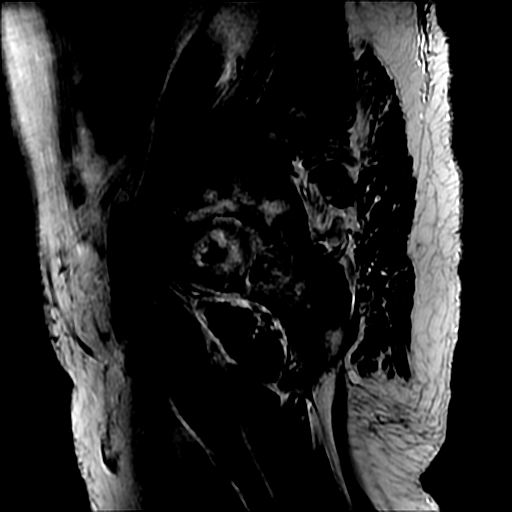
[im 20/39]
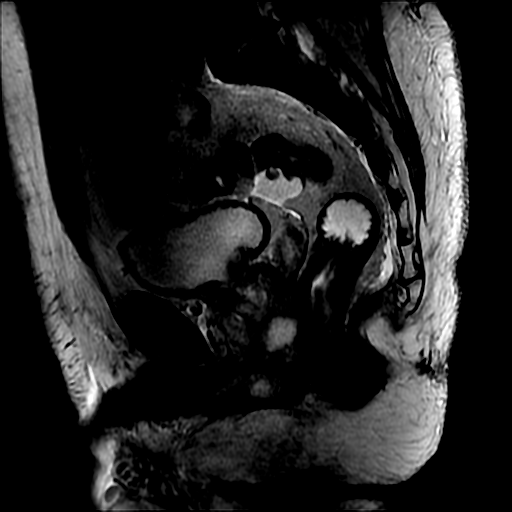
[im 29/39]
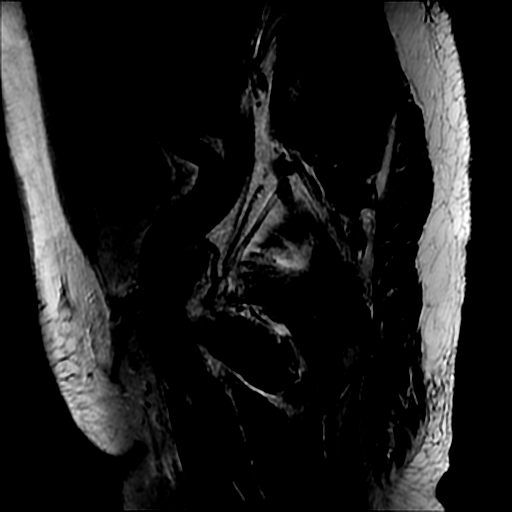
[im 39/39]
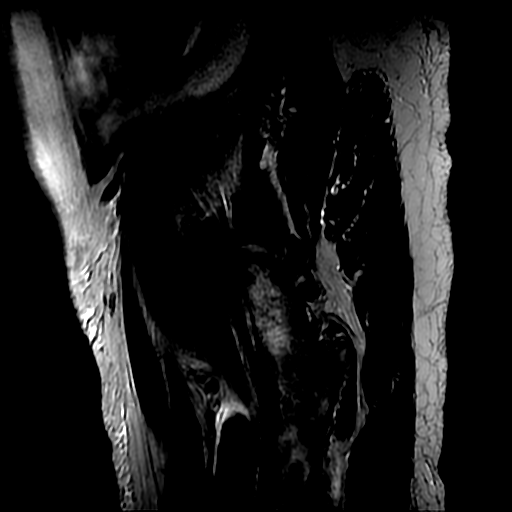

[Series 7: cor ssfse · coronal · 5.0mm · 0.55mm/px · 3 of 33 slices shown]
[im 1/33]
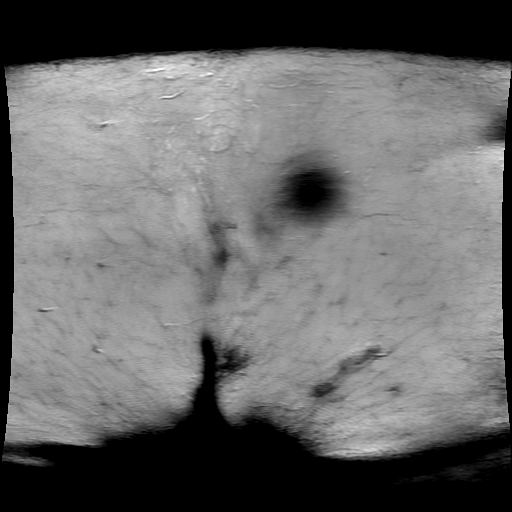
[im 22/33]
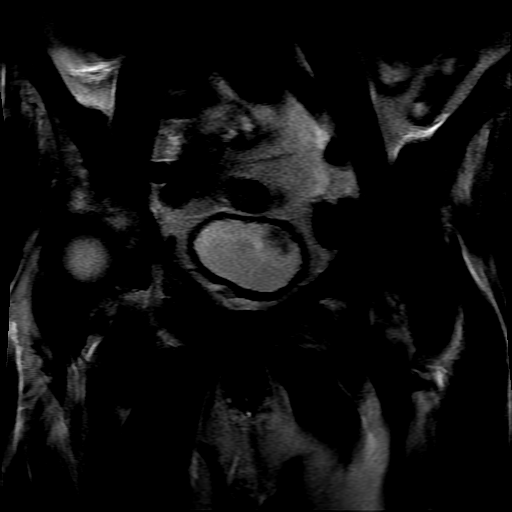
[im 33/33]
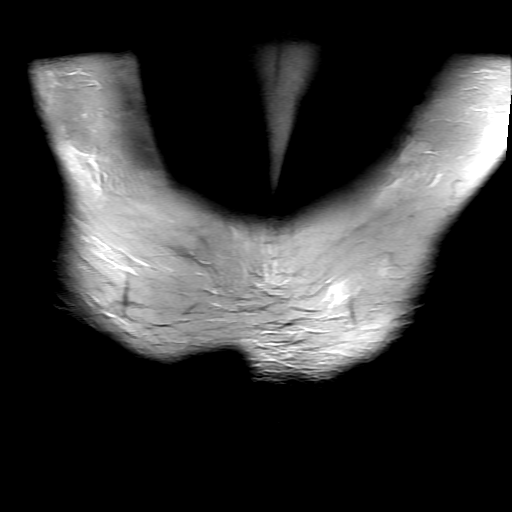

[19 of 48 positions shown; findings below may reference images not displayed]

FINDINGS: Urinary Tract: Wall thickening of an incompletely distended urinary
bladder.

Bowel:  No evidence of bowel obstruction.

Vascular/Lymphatic: No pathologically enlarged lymph nodes. No
significant vascular abnormality seen.

Reproductive: MRI findings consistent with prostatitis with a rim
enhancing thick walled fluid collection measuring 3.7 x 2.8 cm along
the left side of the prostate gland on image [DATE] most consistent
with an intra prostatic abscess.

Other: Trace pelvic free fluid is likely reactive. Small volume
nonspecific subcutaneous edema.

Musculoskeletal: Symmetric edema in the bilateral hip adductors, no
intramuscular fluid collection or abnormal enhancement. No
suspicious osseous lesion.
IMPRESSION: 1. MRI findings consistent with prostatitis with a 3.7 x 2.8 cm rim
enhancing fluid collection along the left side of the prostate gland
most consistent with an intraprostatic abscess.
2. Wall thickening of the incompletely distended urinary bladder.
Correlate for bladder outlet obstruction.
3. Symmetric edema in the bilateral hepatic dome may reflect
nonspecific myositis. No evidence of pyomyositis.

## 2021-12-29 IMAGING — CT CT MAXILLOFACIAL W/ CM
3 series · 16 of 47 positions shown, 19 images · IV contrast (agent unspecified)
Comparison: None.

CLINICAL DATA: Endocarditis.  Search for source.

EXAM:
CT MAXILLOFACIAL WITH CONTRAST
TECHNIQUE: Multidetector CT imaging of the maxillofacial structures was
performed with intravenous contrast. Multiplanar CT image
reconstructions were also generated.

[Series 3: facialbone 2.0 (person_name) (person_name) · axial · 0.41mm/px · z∈[-210,-30]mm · 10 of 106 slices shown, 13 images]
[im 8/106  brain]
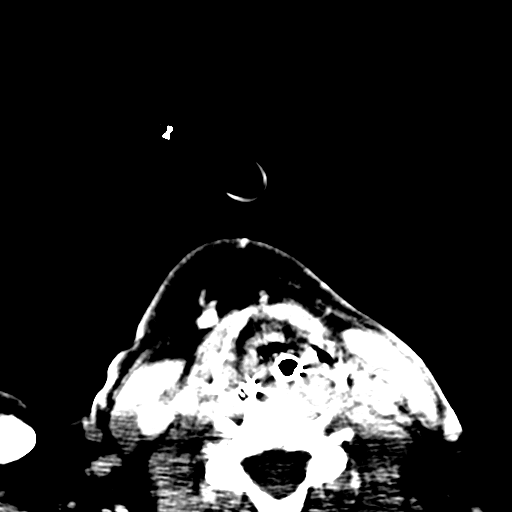
[im 8/106  bone]
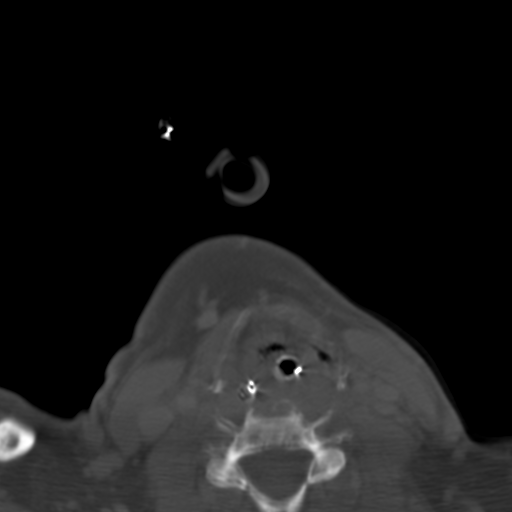
[im 19/106  bone]
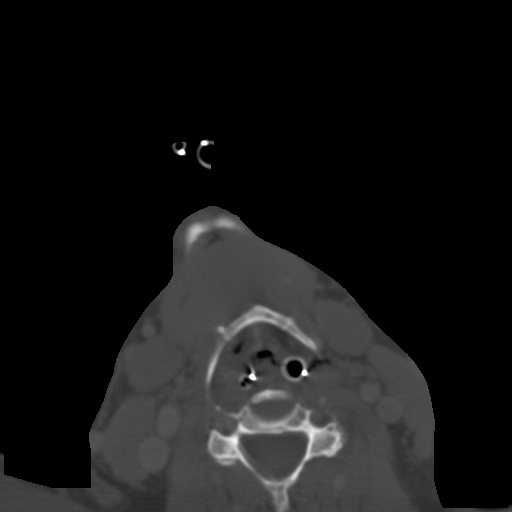
[im 29/106  bone]
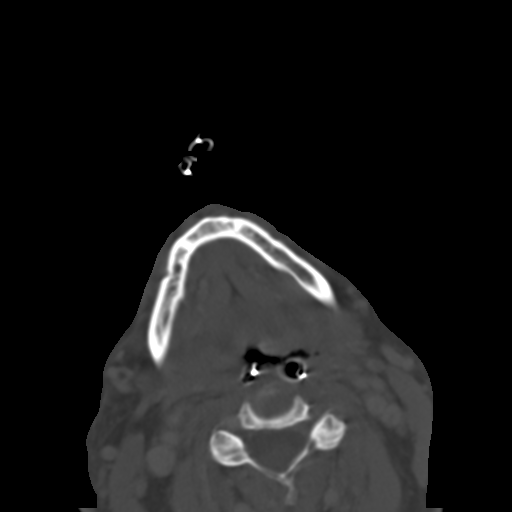
[im 37/106  bone]
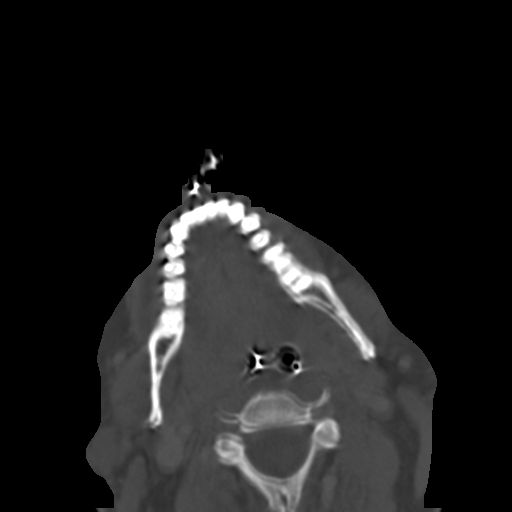
[im 48/106  brain]
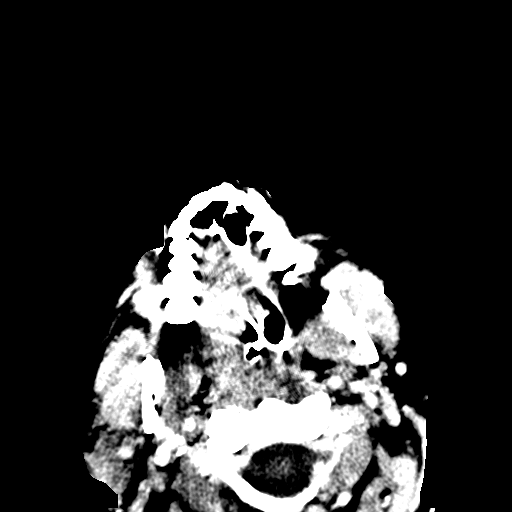
[im 48/106  bone]
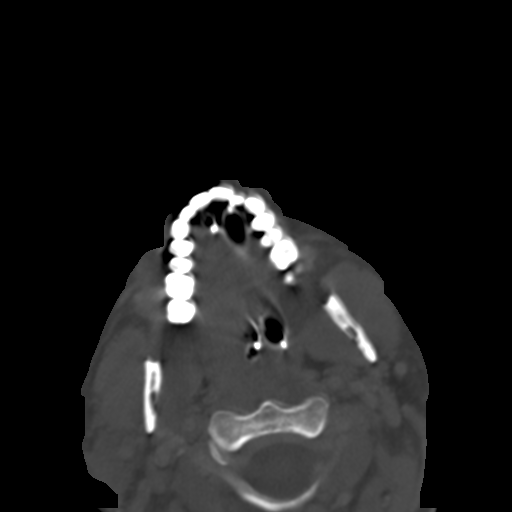
[im 58/106  bone]
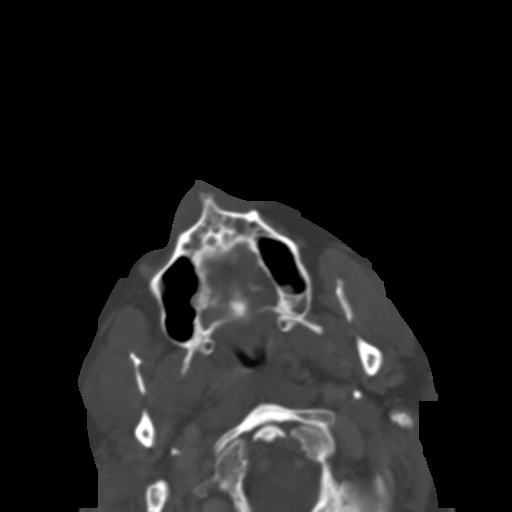
[im 69/106  bone]
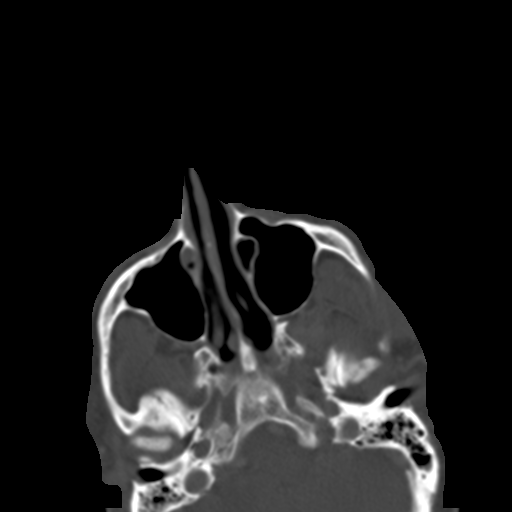
[im 80/106  bone]
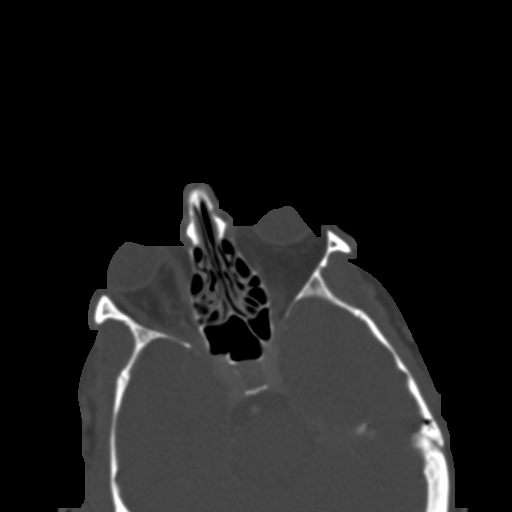
[im 87/106  brain]
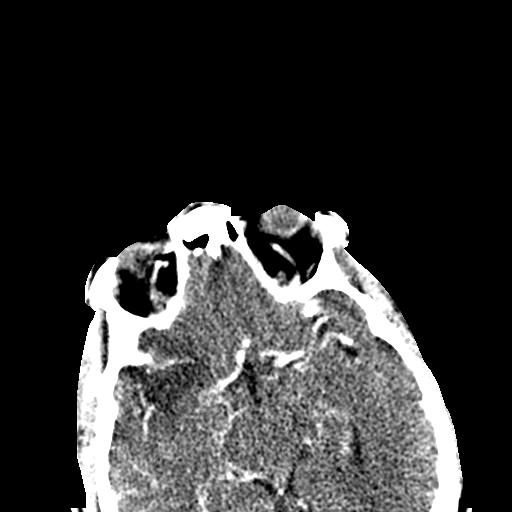
[im 87/106  bone]
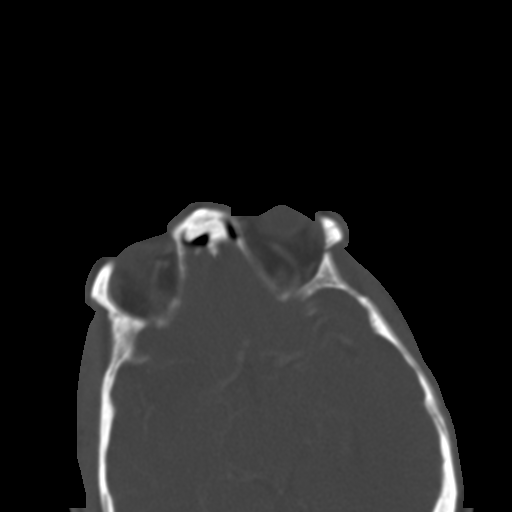
[im 98/106  bone]
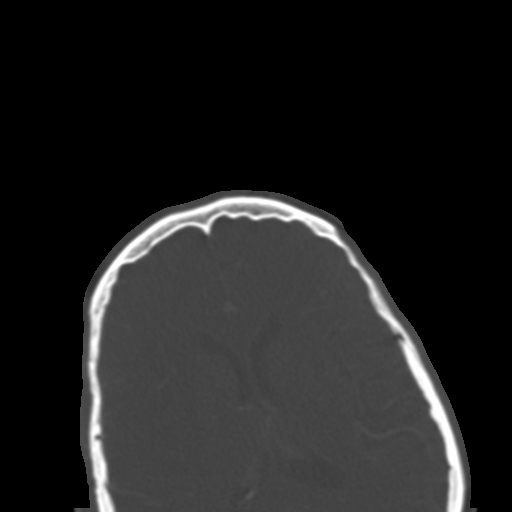

[Series 7: facialbone 2.0 cor st · coronal · 0.41mm/px · 3 of 85 slices shown]
[im 29/85  bone]
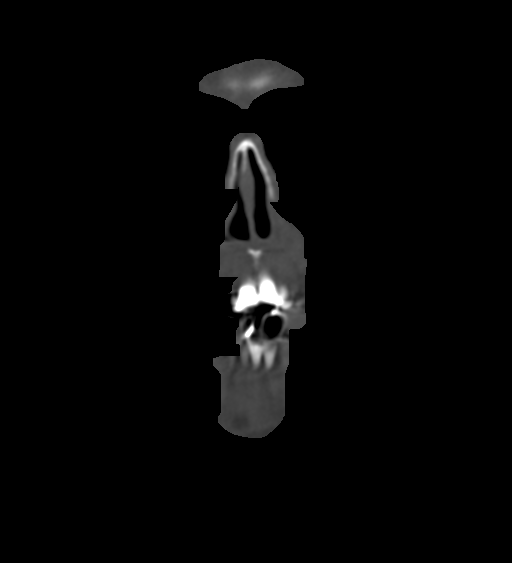
[im 38/85  bone]
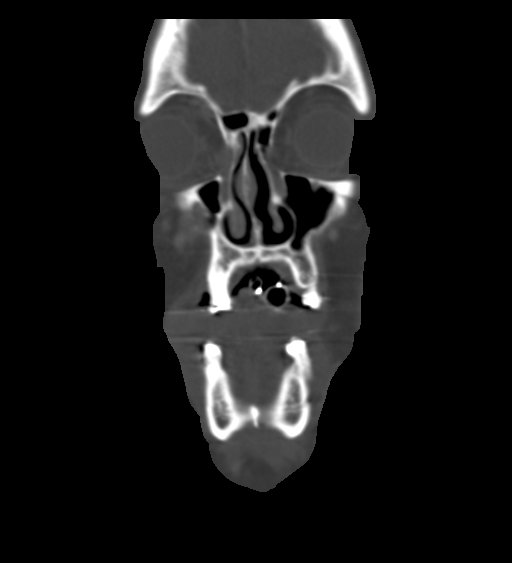
[im 47/85  bone]
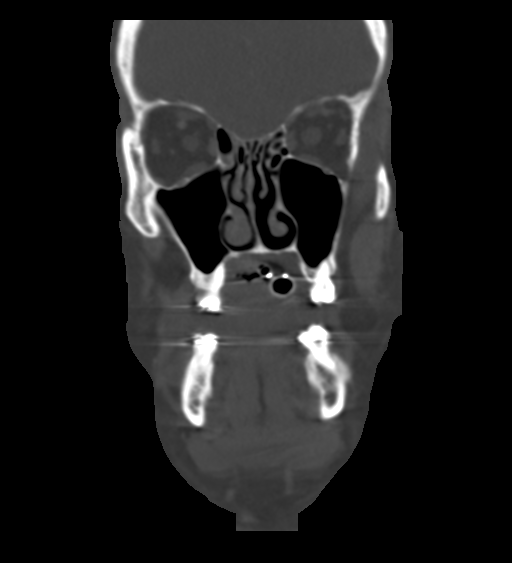

[Series 8: facialbone 2.0 sag st · sagittal · 0.37mm/px · 3 of 94 slices shown]
[im 32/94  bone]
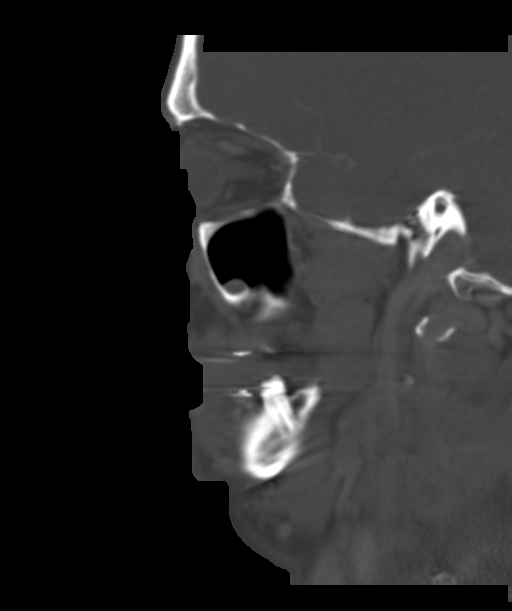
[im 47/94  bone]
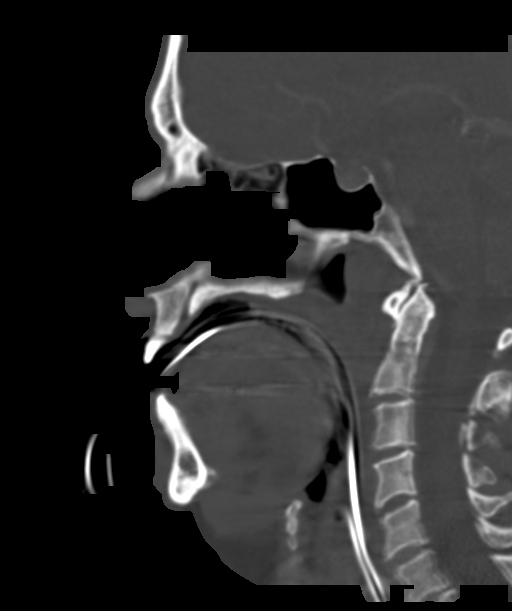
[im 63/94  bone]
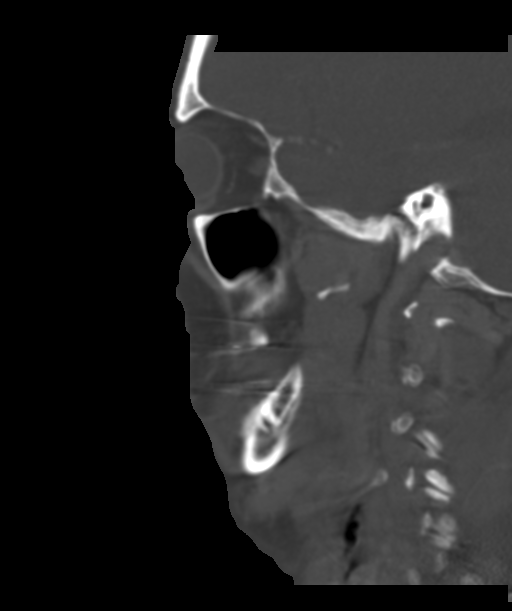

[16 of 47 positions shown; findings below may reference images not displayed]

RADIATION DOSE REDUCTION: This exam was performed according to the
departmental dose-optimization program which includes automated
exposure control, adjustment of the mA and/or kV according to
patient size and/or use of iterative reconstruction technique.

CONTRAST:  100mL OMNIPAQUE IOHEXOL 300 MG/ML  SOLN
FINDINGS: Osseous: No fracture or mandibular dislocation. No destructive
process. No focal dental abnormality.

Orbits: Negative. No traumatic or inflammatory finding.

Sinuses: Clear.

Soft tissues: Negative.

Limited intracranial: No significant or unexpected finding.
IMPRESSION: Normal maxillofacial CT.  No focal dental abnormality.

## 2021-12-29 SURGERY — COLONOSCOPY WITH PROPOFOL
Anesthesia: Moderate Sedation

## 2021-12-29 SURGERY — TURP (TRANSURETHRAL RESECTION OF PROSTATE)
Anesthesia: General | Site: Abdomen

## 2021-12-29 MED ORDER — 0.9 % SODIUM CHLORIDE (POUR BTL) OPTIME
TOPICAL | Status: DC | PRN
  Administered 2021-12-29: 1000 mL

## 2021-12-29 MED ORDER — ROCURONIUM BROMIDE 10 MG/ML (PF) SYRINGE
PREFILLED_SYRINGE | INTRAVENOUS | Status: AC
  Filled 2021-12-29: qty 10

## 2021-12-29 MED ORDER — SODIUM CHLORIDE 0.9 % IV SOLN
2.0000 g | Freq: Two times a day (BID) | INTRAVENOUS | Status: DC
  Administered 2021-12-29 – 2021-12-31 (×6): 2 g via INTRAVENOUS
  Filled 2021-12-29 (×6): qty 20

## 2021-12-29 MED ORDER — LACTATED RINGERS IV SOLN: INTRAVENOUS | Status: DC | PRN

## 2021-12-29 MED ORDER — STERILE WATER FOR IRRIGATION IR SOLN
Status: DC | PRN
  Administered 2021-12-29: 30 mL

## 2021-12-29 MED ORDER — GADOBUTROL 1 MMOL/ML IV SOLN
10.0000 mL | Freq: Once | INTRAVENOUS | Status: AC | PRN
  Administered 2021-12-29: 10 mL via INTRAVENOUS

## 2021-12-29 MED ORDER — VANCOMYCIN HCL 750 MG/150ML IV SOLN
750.0000 mg | Freq: Two times a day (BID) | INTRAVENOUS | Status: DC
  Administered 2021-12-29 (×2): 750 mg via INTRAVENOUS
  Filled 2021-12-29 (×3): qty 150

## 2021-12-29 MED ORDER — ONDANSETRON HCL 4 MG/2ML IJ SOLN
INTRAMUSCULAR | Status: AC
  Filled 2021-12-29: qty 2

## 2021-12-29 MED ORDER — PROPOFOL 10 MG/ML IV BOLUS
INTRAVENOUS | Status: DC | PRN
  Administered 2021-12-29: 30 mg via INTRAVENOUS

## 2021-12-29 MED ORDER — FENTANYL CITRATE (PF) 250 MCG/5ML IJ SOLN
INTRAMUSCULAR | Status: DC | PRN
  Administered 2021-12-29: 50 ug via INTRAVENOUS

## 2021-12-29 MED ORDER — FENTANYL CITRATE (PF) 250 MCG/5ML IJ SOLN
INTRAMUSCULAR | Status: AC
  Filled 2021-12-29: qty 5

## 2021-12-29 MED ORDER — IOHEXOL 300 MG/ML  SOLN
100.0000 mL | Freq: Once | INTRAMUSCULAR | Status: AC | PRN
  Administered 2021-12-29: 100 mL via INTRAVENOUS

## 2021-12-29 MED ORDER — ONDANSETRON HCL 4 MG/2ML IJ SOLN
INTRAMUSCULAR | Status: DC | PRN
Start: 2021-12-29 — End: 2021-12-29
  Administered 2021-12-29: 4 mg via INTRAVENOUS

## 2021-12-29 MED ORDER — LIDOCAINE HCL URETHRAL/MUCOSAL 2 % EX GEL
CUTANEOUS | Status: AC
  Filled 2021-12-29: qty 11

## 2021-12-29 MED ORDER — ROCURONIUM BROMIDE 10 MG/ML (PF) SYRINGE
PREFILLED_SYRINGE | INTRAVENOUS | Status: DC | PRN
  Administered 2021-12-29: 50 mg via INTRAVENOUS

## 2021-12-29 MED ORDER — SODIUM CHLORIDE 0.9 % IR SOLN
Status: DC | PRN
  Administered 2021-12-29: 9000 mL
  Administered 2021-12-29: 3000 mL

## 2021-12-29 MED ORDER — CLONIDINE HCL 0.1 MG PO TABS
0.1000 mg | ORAL_TABLET | Freq: Three times a day (TID) | ORAL | Status: DC
  Administered 2021-12-29 – 2022-01-09 (×30): 0.1 mg
  Filled 2021-12-29 (×32): qty 1

## 2021-12-29 SURGICAL SUPPLY — 22 items
BAG DRN RND TRDRP ANRFLXCHMBR (UROLOGICAL SUPPLIES) ×1
BAG URINE DRAIN 2000ML AR STRL (UROLOGICAL SUPPLIES) ×2 IMPLANT
BAG URO CATCHER STRL LF (MISCELLANEOUS) ×2 IMPLANT
CATH FOLEY 2W COUNCIL 20FR 5CC (CATHETERS) ×1 IMPLANT
CATH FOLEY 3WAY 30CC 24FR (CATHETERS)
CATH HEMA 3WAY 30CC 22FR COUDE (CATHETERS) IMPLANT
CATH URTH STD 24FR FL 3W 2 (CATHETERS) IMPLANT
GLOVE BIO SURGEON STRL SZ7.5 (GLOVE) ×2 IMPLANT
GOWN STRL REUS W/ TWL LRG LVL3 (GOWN DISPOSABLE) ×1 IMPLANT
GOWN STRL REUS W/ TWL XL LVL3 (GOWN DISPOSABLE) ×1 IMPLANT
GOWN STRL REUS W/TWL LRG LVL3 (GOWN DISPOSABLE)
GOWN STRL REUS W/TWL XL LVL3 (GOWN DISPOSABLE) ×4
GUIDEWIRE STR DUAL SENSOR (WIRE) ×1 IMPLANT
KIT TURNOVER KIT B (KITS) ×2 IMPLANT
LOOP CUT BIPOLAR 24F LRG (ELECTROSURGICAL) ×2 IMPLANT
MANIFOLD NEPTUNE II (INSTRUMENTS) ×2 IMPLANT
NS IRRIG 1000ML POUR BTL (IV SOLUTION) ×2 IMPLANT
PACK CYSTO (CUSTOM PROCEDURE TRAY) ×2 IMPLANT
SYPHON OMNI JUG (MISCELLANEOUS) ×1 IMPLANT
SYR TOOMEY 50ML (SYRINGE) ×1 IMPLANT
TOWEL GREEN STERILE FF (TOWEL DISPOSABLE) ×2 IMPLANT
TUBE CONNECTING 12X1/4 (SUCTIONS) ×2 IMPLANT

## 2021-12-29 SURGICAL SUPPLY — 25 items

## 2021-12-29 NOTE — Progress Notes (Signed)
Patient ID: Vincent Ferrell, male   DOB: [birth year], 61 y.o.   MRN: [id number] ? ? ? Progress Note ? ? Subjective  ? Day # 4 ? CC; acute MCA CVA status post thrombectomy, possible endocarditis/pericarditis-acute GI hemorrhage. ? ?Colonoscopy yesterday poor prep due to significant blood in the colon, unable to intubate TI-lots of clotted blood and dark red liquid blood within the entire colon. ? ?Reprepped last night ? 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 ?Hemoglobin 6.8 yesterday> 5.8> transfused 2 units up to 9.3> 8.4 this a.m. ? ?Nurse reports prep was completed without difficulty-he has a rectal tube in currently, liquid dark stool, no gross blood ? ? Objective  ? ?Vital signs in last 24 hours: ?Temp:  [98 ?F (36.7 ?C)-102 ?F (38.9 ?C)] 102 ?F (38.9 ?C) (04/18 0400) ?Pulse Rate:  [60-119] 91 (04/18 0800) ?Resp:  [18-41] 30 (04/18 0800) ?BP: (85-160)/(49-97) 126/82 (04/18 0800) ?SpO2:  [81 %-100 %] 100 % (04/18 0800) ?FiO2 (%):  [30 %-97 %] 30 % (04/18 0439) ?Last BM Date : 12/28/21 ?General: Older white male , intubated, will to respond via hand motions ?Heart:  Regular rate and rhythm; no murmurs ?Lungs: Coarse breath sounds bilaterally ?Abdomen:  Soft, nontender and nondistended. Normal bowel sounds. ?Extremities:  Without edema. ?Neurologic:  Alert, making appropriate hand motions ?. ? ?Intake/Output from previous day: ?04/17 0701 - 04/18 0700 ?In: 2909.5 [I.V.:796.7; Blood:513; NG/GT:1200; IV Piggyback:399.8] ?Out: 2550 [Urine:2550] ?Intake/Output this shift: ?Total I/O ?In: 67.3 [I.V.:67.3] ?Out: -  ? ?Lab Results: ?Recent Labs  ?  12/28/21 ?[id number] 12/28/21 ?1023 12/28/21 ?1553 12/28/21 ?2101 12/29/21 ?0617 12/29/21 ?[id number]  ?WBC 12.2* 10.5  --   --  10.4  --   ?HGB 7.6* 6.8*   < > 9.3* 8.4* 9.5*  ?HCT 23.8* 22.4*   < > 28.7* 26.3* 28.4*  ?PLT 332 334  --   --  352  --   ? < > = values in this interval not displayed.  ? ?BMET ?Recent Labs  ?  12/27/21 ?0032  12/28/21 ?0025 12/28/21 ?1626 12/29/21 ?0617  ?NA 143 149* 152* 154*  ?K 4.6 4.2 4.0 3.6  ?CL 113* 124*  --  125*  ?CO2 22 20*  --  21*  ?GLUCOSE 146* 143*  --  131*  ?BUN 53* 46*  --  36*  ?CREATININE 2.33* 1.87*  --  1.81*  ?CALCIUM 7.0* 7.4*  --  7.9*  ? ?LFT ?Recent Labs  ?  12/27/21 ?0032  ?PROT 4.2*  ?ALBUMIN 1.6*  ?AST 33  ?ALT 24  ?ALKPHOS 47  ?BILITOT 0.6  ?BILIDIR <0.1  ?IBILI NOT CALCULATED  ? ?PT/INR ?No results for input(s): LABPROT, INR in the last 72 hours. ? ?Studies/Results: ?ECHO TEE ? ?Result Date: 12/28/2021 ?   TRANSESOPHOGEAL ECHO REPORT   Patient Name:   Vincent Ferrell Date of Exam: 12/28/2021 Medical Rec #:  [id number]         Height:       71.5 in Accession #:    [id number]        Weight:       221.8 lb Date of Birth:  1961/07/10         BSA:          2.215 m? Patient Age:    61 years          BP:           103/69 mmHg Patient Gender: M  HR:           77 bpm. Exam Location:  Inpatient Procedure: Transesophageal Echo, 3D Echo, Color Doppler and Cardiac Doppler Indications:     Endocarditis, Stroke i63.9  History:         Patient has prior history of Echocardiogram examinations, most                  recent 12/26/2021. Risk Factors:Dyslipidemia.  Sonographer:     Raquel Sarna Senior RDCS Referring Phys:  [id number] [facility] A Gasper Sells Diagnosing Phys: Rudean Haskell MD PROCEDURE: After discussion of the risks and benefits of a TEE, an informed consent was obtained from a family member. The transesophogeal probe was passed without difficulty through the esophogus of the patient. Sedation performed by performing physician. Patients was under conscious sedation during this procedure. Anesthetic administered: 66mcg of Fentanyl, 3mg  of Versed. The patient developed no complications during the procedure. IMPRESSIONS  1. The mitral valve is abnormal. Mitral valve vegetation involving A2-P2 and A3-P3. Largest 1 cm X 0.5 cm. No perforation. No commisurate involvement. Invovlvement of the  atrial and ventricular surfaces. No involvement of the intravalvular fibrosia. Trivial mitral valve regurgitation. No evidence of mitral stenosis.  2. Left ventricular ejection fraction, by estimation, is 55 to 60%. The left ventricle has normal function.  3. Right ventricular systolic function is normal. The right ventricular size is normal.  4. No left atrial/left atrial appendage thrombus was detected. The LAA emptying velocity was 37 cm/s.  5. A small pericardial effusion is present. The pericardial effusion is localized near the right atrium.  6. The aortic valve is tricuspid. Aortic valve regurgitation is not visualized. No aortic stenosis is present. FINDINGS  Left Ventricle: Left ventricular ejection fraction, by estimation, is 55 to 60%. The left ventricle has normal function. The left ventricular internal cavity size was normal in size. Right Ventricle: The right ventricular size is normal. No increase in right ventricular wall thickness. Right ventricular systolic function is normal. Left Atrium: Left atrial size was normal in size. No left atrial/left atrial appendage thrombus was detected. The LAA emptying velocity was 37 cm/s. Right Atrium: Right atrial size was normal in size. Pericardium: A small pericardial effusion is present. The pericardial effusion is localized near the right atrium. Mitral Valve: The mitral valve is abnormal. Trivial mitral valve regurgitation. No evidence of mitral valve stenosis. Tricuspid Valve: The tricuspid valve is normal in structure. Tricuspid valve regurgitation is not demonstrated. No evidence of tricuspid stenosis. Aortic Valve: The aortic valve is tricuspid. Aortic valve regurgitation is not visualized. No aortic stenosis is present. Pulmonic Valve: The pulmonic valve was grossly normal. Pulmonic valve regurgitation is mild. No evidence of pulmonic stenosis. Aorta: The aortic root and ascending aorta are structurally normal, with no evidence of dilitation.  IAS/Shunts: No atrial level shunt detected by color flow Doppler. Rudean Haskell MD Electronically signed by Rudean Haskell MD Signature Date/Time: 12/28/2021/5:32:14 PM    Final    ? ? ? ? Assessment / Plan:   ? ?#65 61 year old white male admitted 4 days ago with acute MCA CVA-underwent thrombectomy ? ?#2 right lower lobe pneumonia ?#3 ruled out for ST EMI-TEE yesterday positive for atrial valve vegetation-on vancomycin, ID consulting today ? ?#4 acute GI bleed-onset of grossly bloody stools 12/27/2021-has been transfused a total of 6 units of packed RBCs today, 2 were given last p.m.-hemoglobin 8.4 this morning ? ?Colonoscopy-diagnostic yesterday due to significant amount of blood in the colon ?Reprepped last evening ?Plan for  colonoscopy and EGD today ? ?#5 acute hypoxic respiratory failure/multifocal pneumonia-febrile last evening to 102 ? ?#6 anemia secondary to acute blood loss-continue to transfuse to keep hemoglobin closer to 8 ? ? ? ? ?Principal Problem: ?  Acute ischemic right MCA stroke ([place]) ?Active Problems: ?  Middle cerebral artery embolism, right ?  Leukocytosis ?  Sinus tachycardia ? ? ? ? LOS: 4 days  ? ?Goran Olden PA-C 12/29/2021, 8:53 AM ?  ?

## 2021-12-29 NOTE — Op Note (Signed)
[facility] ?Patient Name: Vincent Ferrell ?Procedure Date : 12/29/2021 ?MRN: [id number] ?Attending MD: Tressia Danas MD, MD ?Date of Birth: 1961/07/24 ?CSN: [id number] ?Age: 61 ?Admit Type: Inpatient ?Procedure:                Colonoscopy ?Indications:              Hematochezia ?Providers:                Tressia Danas MD, MD, Fransisca Connors, Rodman Pickle  ?                          [place], Technician, [employer],  ?                          Technician ?Referring MD:              ?Medicines:                None ?Complications:            No immediate complications. ?Estimated Blood Loss:     Estimated blood loss: none. ?Procedure:                Pre-Anesthesia Assessment: ?                          - Prior to the procedure, a History and Physical  ?                          was performed, and patient medications and  ?                          allergies were reviewed. The patient's tolerance of  ?                          previous anesthesia was also reviewed. The risks  ?                          and benefits of the procedure and the sedation  ?                          options and risks were discussed with the patient.  ?                          All questions were answered, and informed consent  ?                          was obtained. Prior Anticoagulants: The patient has  ?                          taken no previous anticoagulant or antiplatelet  ?                          agents. ASA Grade Assessment: III - A patient with  ?                          severe systemic disease. After reviewing the risks  ?  and benefits, the patient was deemed in  ?                          satisfactory condition to undergo the procedure. ?                          After obtaining informed consent, the colonoscope  ?                          was passed under direct vision. Throughout the  ?                          procedure, the patient's blood pressure, pulse, and  ?                           oxygen saturations were monitored continuously. The  ?                          [id number] ([id number] Olympus coloscope was  ?                          introduced through the anus and advanced to the the  ?                          cecum, identified by appendiceal orifice and  ?                          ileocecal valve. The colonoscopy was performed with  ?                          moderate difficulty due to poor endoscopic  ?                          visualization. The patient tolerated the procedure  ?                          well. The quality of the bowel preparation was  ?                          adequate to identify polyps 6 mm and larger in size. ?Scope In: 11:17:05 AM ?Scope Out: 11:38:28 AM ?Scope Withdrawal Time: 0 hours 11 minutes 28 seconds  ?Total Procedure Duration: 0 hours 21 minutes 23 seconds  ?Findings: ?     The perianal and digital rectal examinations were normal. ?     The examined colon appeared normal. There was no evidence for ischemic  ?     colitis. However, black liquid stool throughout the colon limited an  ?     evaluation for small or flat lesions. ?     A 3 mm polyp was found in the rectum. The polyp was sessile. It was not  ?     removed in the setting of unexplained bleeding. ?     Non-bleeding internal hemorrhoids were found. ?Impression:               - The entire examined colon is normal. ?                          -  Non-bleeding internal hemorrhoids. ?                          - The examination was otherwise normal on direct  ?                          and retroflexion views. ?                          - No specimens collected. ?Recommendation:           - NPO. ?                          - Continue present medications. ?                          - Repeat colonoscopy as an outpatient for  ?                          polypectomy. ?                          - Continue serial hgb/hct with transfusion as  ?                          indicated. ?                           - CTA to localize the bleeding site with any  ?                          significant overt bleeding. ?                          Results and recommendations were discussed with the  ?                          patient's sister by telephone. ?Procedure Code(s):        --- Professional --- ?                          [phone], Colonoscopy, flexible; diagnostic, including  ?                          collection of specimen(s) by brushing or washing,  ?                          when performed (separate procedure) ?Diagnosis Code(s):        --- Professional --- ?                          B14.7, Other hemorrhoids ?                          K92.1, Melena (includes Hematochezia) ?CPT copyright 2019 [employer]. All rights reserved. ?The codes documented in this report are preliminary and upon coder review may  ?be revised to meet current compliance  requirements. ?Tressia Danas MD, MD ?12/29/2021 12:54:17 PM ?This report has been signed electronically. ?Number of Addenda: 0 ?

## 2021-12-29 NOTE — Op Note (Addendum)
Operative Note ? ?Preoperative diagnosis:  ?1.  Prostate abscess ? ?Postoperative diagnosis: ?1.  Prostate abscess ? ?Procedure(s): ?1.  Transurethral resection/unroofing of the prostate/prostatic abscess ? ?Surgeon: Modena Slater, MD ? ?Assistants: None ? ?Anesthesia: General ? ?Complications: None immediate ? ?EBL: Minimal ? ?Specimens: ?1.  Urine culture ?2.  Prostate chips ? ?Drains/Catheters: ?1.  20 [place] council tip catheter ? ?Intraoperative findings: 1.  Normal anterior urethra 2.  Moderately obstructing prostate.  He had a small median lobe.  Bladder mucosa was normal without any masses. ?3.  Resection of the left prostate at the apex unroofed a large prostate abscess.  This was sent for culture.  There was a large cavity as a result of the resection and therefore catheter was placed over a wire. ? ?Indication: 61 year old male critically ill following a stroke secondary to septic emboli from endocarditis.  CT scan was performed that revealed ill-defined area in the prostate.  Subsequent MRI confirmed suspicion for prostate abscess.  He presents for transurethral unroofing of the prostate abscess. ? ?Description of procedure: ? ?The patient was identified and consent was obtained from his sister since he was intubated.  The patient was taken to the operating room and placed in the supine position.  The patient was placed under general anesthesia.  He is already on scheduled antibiotics.  The patient was placed in dorsal lithotomy.  Patient was prepped and draped in a standard sterile fashion and a timeout was performed. ? ?A 26 French resectoscope with visual obturator in place was advanced into the urethra and into the bladder.  Complete cystoscopy was performed with no abnormal findings.  I exchanged this for the bipolar working element.  I brought the scope back to the left prostate apex/mid prostate and resected this area.  I encountered the abscess cavity which was right at the prostate apex and  copious amounts of pus was evacuated.  I used a syringe to suction some of the fluid out from the abscess cavity through the scope and sent that for culture.  I evacuated the entire abscess cavity.  On the left side of the prostate, there was a large defect from the resection as well as the abscess cavity.  I fulgurated the active bleeding.  Once there was good hemostasis, I advanced the scope into the bladder and advanced a wire through the scope and into the bladder and withdrew the scope.  I then placed a 20 [place] council tip catheter over the wire and withdrew the wire.  20 cc of sterile water was instilled into the catheter balloon.  This concluded the operation.  Patient tolerated the procedure well. ? ?Plan: He will be transferred back to the ICU.  Continue antibiotics.  Follow-up cultures.  He will need to keep his catheter for 2 weeks and subsequently have a voiding trial in the clinic. ? ?If he needs to have the catheter exchanged, would recommend doing this over a wire.  Alternatively, could use a coud? catheter pointed towards the right. ? ?

## 2021-12-29 NOTE — Transfer of Care (Signed)
Immediate Anesthesia Transfer of Care Note ? ?Patient: Vincent Ferrell ? ?Procedure(s) Performed: TRANSURETHRAL RESECTION OF THE PROSTATE (TURP) (Abdomen) ? ?Patient Location: PACU and ICU ? ?Anesthesia Type:General ? ?Level of Consciousness: sedated and Patient remains intubated per anesthesia plan ? ?Airway & Oxygen Therapy: Patient remains intubated per anesthesia plan and Patient placed on Ventilator (see vital sign flow sheet for setting) ? ?Post-op Assessment: Report given to RN and Post -op Vital signs reviewed and stable ? ?Post vital signs: Reviewed and stable ? ?Last Vitals:  ?Vitals Value Taken Time  ?BP    ?Temp    ?Pulse 110 12/29/21 2329  ?Resp 22 12/29/21 2329  ?SpO2 89 % 12/29/21 2329  ?Vitals shown include unvalidated device data. ? ?Last Pain:  ?Vitals:  ? 12/29/21 2000  ?TempSrc: Axillary  ?PainSc:   ?   ? ?  ? ?Complications: No notable events documented. ?

## 2021-12-29 NOTE — Consult Note (Signed)
H&P ?Physician requesting consult: Levy Pupa ? ?Chief Complaint: Prostate abscess ? ?History of Present Illness: 61 year old male was being treated outpatient for a right lower lobe pneumonia.  He subsequent developed an acute CVA and underwent mechanical thrombectomy and revascularization.  He was initially extubated but subsequently reintubated secondary to poor airway protection and had associated fever and tachycardia.  He also had a GI bleed that required 2 units of PRBC.  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  Stroke is thought to be secondary to septic emboli.  Looking back at the CT scan from 4/15, there was mention of a 3.5 cm lobulated area of low-attenuation within the prostate for which correlation with PSA was recommended.  PSA was elevated.  He continues to have intermittent fever.  After looking back at the CT scan, I was consulted for review.  It looked like an abscess to me and therefore I obtained an MRI of the pelvis that confirmed findings consistent with prostatitis with a 3.7 cm rim-enhancing fluid collection along the left side of the prostate gland consistent with abscess.  Patient has undergone colonoscopy and upper GI study today.  Hemoglobin has been stable over the past day.  He has not required any further blood transfusions.  He remains intubated and sedated.  I spoke with Dr. Delton Coombes about his condition.  Overall consensus is that he is stable for the operating room. ? ? ? ?Past Medical History:  ?Diagnosis Date  ? Chest pain in adult 10/24/2017  ? Elevated BP without diagnosis of hypertension 10/24/2017  ? Impaired fasting glucose 10/24/2017  ? Mixed hyperlipidemia 10/24/2017  ? Rising PSA level 10/24/2017  ? ?Past Surgical History:  ?Procedure Laterality Date  ? COLONOSCOPY WITH PROPOFOL N/A 12/28/2021  ? Procedure: COLONOSCOPY WITH PROPOFOL;  Surgeon: Tressia Danas, MD;  Location: [facility] ENDOSCOPY;  Service: Gastroenterology;  Laterality: N/A;  ? IR CT HEAD  LTD  12/25/2021  ? IR PERCUTANEOUS ART THROMBECTOMY/INFUSION INTRACRANIAL INC DIAG ANGIO  12/25/2021  ? RADIOLOGY WITH ANESTHESIA N/A 12/25/2021  ? Procedure: IR WITH ANESTHESIA;  Surgeon: Radiologist, Medication, MD;  Location: [facility] OR;  Service: Radiology;  Laterality: N/A;  ? ? ?Home Medications:  ?Medications Prior to Admission  ?Medication Sig Dispense Refill Last Dose  ? aspirin EC 81 MG tablet Take 81 mg by mouth daily. Swallow whole.   unk  ? benzonatate (TESSALON) 100 MG capsule Take 100 mg by mouth 3 (three) times daily as needed for cough.   unk  ? doxycycline (VIBRAMYCIN) 100 MG capsule Take 100 mg by mouth 2 (two) times daily.   unk  ? lisinopril (ZESTRIL) 5 MG tablet Take 5 mg by mouth daily.   unk  ? loratadine (CLARITIN) 10 MG tablet Take 10 mg by mouth daily.     ? Omega-3 1000 MG CAPS Take 1,000 mg by mouth daily.   unk  ? pantoprazole (PROTONIX) 20 MG tablet Take 20 mg by mouth daily.   unk  ? simvastatin (ZOCOR) 40 MG tablet Take 40 mg by mouth at bedtime.   unk  ? ?Allergies:  ?Allergies  ?Allergen Reactions  ? Pollen Extract Other (See Comments)  ?  Sneezing   ? ? ?History reviewed. No pertinent family history. ?Social History:  has no history on file for tobacco use, alcohol use, and drug use. ? ?ROS: ?A complete review of systems was performed.  All systems are negative except for pertinent findings as noted. ?ROS ? ? ?Physical Exam:  ?  Vital signs in last 24 hours: ?Temp:  [98 ?F (36.7 ?C)-102.4 ?F (39.1 ?C)] 102.4 ?F (39.1 ?C) (04/18 2000) ?Pulse Rate:  [62-126] 118 (04/18 2000) ?Resp:  [18-33] 22 (04/18 2000) ?BP: (101-178)/(68-101) 110/75 (04/18 2000) ?SpO2:  [81 %-100 %] 95 % (04/18 2000) ?FiO2 (%):  [30 %-40 %] 30 % (04/18 2000) ?General: Intubated and sedated ?HEENT: Normocephalic, atraumatic ?Neck: No JVD or lymphadenopathy ?Cardiovascular: Tachycardic ?Lungs: on ventilator ?Abdomen: Soft, nontender, nondistended, no abdominal masses ? ? ?Laboratory Data:  ?Results for orders placed or  performed during the hospital encounter of 12/25/21 (from the past 24 hour(s))  ?Hemoglobin and hematocrit, blood     Status: Abnormal  ? Collection Time: 12/28/21  9:01 PM  ?Result Value Ref Range  ? Hemoglobin 9.3 (L) 13.0 - 17.0 g/dL  ? HCT 28.7 (L) 39.0 - 52.0 %  ?Glucose, capillary     Status: None  ? Collection Time: 12/28/21 11:47 PM  ?Result Value Ref Range  ? Glucose-Capillary 98 70 - 99 mg/dL  ?Glucose, capillary     Status: None  ? Collection Time: 12/29/21  3:33 AM  ?Result Value Ref Range  ? Glucose-Capillary 83 70 - 99 mg/dL  ?CBC     Status: Abnormal  ? Collection Time: 12/29/21  6:17 AM  ?Result Value Ref Range  ? WBC 10.4 4.0 - 10.5 K/uL  ? RBC 2.82 (L) 4.22 - 5.81 MIL/uL  ? Hemoglobin 8.4 (L) 13.0 - 17.0 g/dL  ? HCT 26.3 (L) 39.0 - 52.0 %  ? MCV 93.3 80.0 - 100.0 fL  ? MCH 29.8 26.0 - 34.0 pg  ? MCHC 31.9 30.0 - 36.0 g/dL  ? RDW 16.3 (H) 11.5 - 15.5 %  ? Platelets 352 150 - 400 K/uL  ? nRBC 0.5 (H) 0.0 - 0.2 %  ?Basic metabolic panel     Status: Abnormal  ? Collection Time: 12/29/21  6:17 AM  ?Result Value Ref Range  ? Sodium 154 (H) 135 - 145 mmol/L  ? Potassium 3.6 3.5 - 5.1 mmol/L  ? Chloride 125 (H) 98 - 111 mmol/L  ? CO2 21 (L) 22 - 32 mmol/L  ? Glucose, Bld 131 (H) 70 - 99 mg/dL  ? BUN 36 (H) 6 - 20 mg/dL  ? Creatinine, Ser 1.81 (H) 0.61 - 1.24 mg/dL  ? Calcium 7.9 (L) 8.9 - 10.3 mg/dL  ? GFR, Estimated 42 (L) >60 mL/min  ? Anion gap 8 5 - 15  ?Triglycerides     Status: Abnormal  ? Collection Time: 12/29/21  6:17 AM  ?Result Value Ref Range  ? Triglycerides 222 (H) <150 mg/dL  ?Glucose, capillary     Status: Abnormal  ? Collection Time: 12/29/21  8:11 AM  ?Result Value Ref Range  ? Glucose-Capillary 101 (H) 70 - 99 mg/dL  ?Hemoglobin and hematocrit, blood     Status: Abnormal  ? Collection Time: 12/29/21  8:32 AM  ?Result Value Ref Range  ? Hemoglobin 9.5 (L) 13.0 - 17.0 g/dL  ? HCT 28.4 (L) 39.0 - 52.0 %  ?Glucose, capillary     Status: Abnormal  ? Collection Time: 12/29/21 12:44 PM  ?Result  Value Ref Range  ? Glucose-Capillary 120 (H) 70 - 99 mg/dL  ?Hemoglobin and hematocrit, blood     Status: Abnormal  ? Collection Time: 12/29/21  3:08 PM  ?Result Value Ref Range  ? Hemoglobin 9.3 (L) 13.0 - 17.0 g/dL  ? HCT 30.2 (L) 39.0 - 52.0 %  ?Glucose,  capillary     Status: Abnormal  ? Collection Time: 12/29/21  3:22 PM  ?Result Value Ref Range  ? Glucose-Capillary 140 (H) 70 - 99 mg/dL  ?Glucose, capillary     Status: Abnormal  ? Collection Time: 12/29/21  7:29 PM  ?Result Value Ref Range  ? Glucose-Capillary 127 (H) 70 - 99 mg/dL  ? ?Recent Results (from the past 240 hour(s))  ?Resp Panel by RT-PCR (Flu A&B, Covid) Nasopharyngeal Swab     Status: None  ? Collection Time: 12/25/21 11:14 AM  ? Specimen: Nasopharyngeal Swab; Nasopharyngeal(NP) swabs in vial transport medium  ?Result Value Ref Range Status  ? SARS Coronavirus 2 by RT PCR NEGATIVE NEGATIVE Final  ?  Comment: (NOTE) ?SARS-CoV-2 target nucleic acids are NOT DETECTED. ? ?The SARS-CoV-2 RNA is generally detectable in upper respiratory ?specimens during the acute phase of infection. The lowest ?concentration of SARS-CoV-2 viral copies this assay can detect is ?138 copies/mL. A negative result does not preclude SARS-Cov-2 ?infection and should not be used as the sole basis for treatment or ?other patient management decisions. A negative result may occur with  ?improper specimen collection/handling, submission of specimen other ?than nasopharyngeal swab, presence of viral mutation(s) within the ?areas targeted by this assay, and inadequate number of viral ?copies(<138 copies/mL). A negative result must be combined with ?clinical observations, patient history, and epidemiological ?information. The expected result is Negative. ? ?Fact Sheet for Patients:  ?BloggerCourse.com ? ?Fact Sheet for Healthcare Providers:  ?SeriousBroker.it ? ?This test is no t yet approved or cleared by the [place] FDA and   ?has been authorized for detection and/or diagnosis of SARS-CoV-2 by ?FDA under an Emergency Use Authorization (EUA). This EUA will remain  ?in effect (meaning this test can be used) for the duration of th

## 2021-12-29 NOTE — Consult Note (Signed)
?   ? ? ? ? ?[facility]   ? ?Date of Admission:  12/25/2021    ? ? ?Total days of antibiotics 4  ? Vancomycin 4/14 >> current ? Ceftriaxone BID 4/18 >> current  ? Unasyn 4/14 >> 4/17 ?      ?      ?Reason for Consult: Bacterial Endocarditis, Acute CVA    ?Referring Provider: Stroke Team / PCCM  ?Primary Care Provider: Houston Siren., MD  ? ? ?Assessment: ?Vincent Ferrell is a 61 y.o. 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 He has a history of hypertension, dyslipidemia and pre-diabetes. A few days prior to his admission he was seen outpatient for an illness that started in early April including fevers, body aches, fatigue and bruising on his hands and given a prescription for doxycycline for presumed sinusitis.  ? ?Presentation concerning for endocarditis -with TEE confirming mitral valve vegetation. Thus far, blood cultures are sterile at 4d and 1d after 2 sets drawn.  Suspect that outpatient use of doxycycline could have sterilized blood. Leukocytosis is improving on antibiotics. Still febrile today. Would continue current antibiotics, however increase ceftriaxone to BID for better CNS penetration given acute CVA related to this infection.  ?Regarding risk factors, UDS was negative and denied drug use, pre-diabetic (A1C 6%). He reported a dental cleaning sometime within the last month, however not certain that this is related. GI bleeding may have caused bacteremic translocation, but would suspect we would grow a strep in cultures from this.  ? ? ?Plan: ?Follow blood cultures  ?Follow GI assessment / intervention ?Continue vancomycin  ?Increase ceftriaxone to BID dosing for CNS coverage  ? ? ?Principal Problem: ?  Acute ischemic right MCA stroke ([place]) ?Active Problems: ?  Middle cerebral artery embolism, right ?  Leukocytosis ?  Sinus tachycardia ? ? ?  atorvastatin  40 mg Per Tube Daily  ? chlorhexidine gluconate (MEDLINE KIT)  15 mL Mouth Rinse BID  ? Chlorhexidine Gluconate Cloth  6 each Topical Daily  ? docusate  100 mg Per Tube BID  ? insulin aspart  0-15 Units Subcutaneous Q4H  ? mouth rinse  15 mL Mouth Rinse 10 times per day  ? pantoprazole (PROTONIX) IV  40 mg Intravenous Q12H  ? sodium chloride flush  3 mL Intravenous Once  ? ? ?HPI: Vincent Ferrell is a 61 y.o. male admitted from home with sudden onset left sided weakness, left sided facial droop and right gaze deviation on 12/25/21 AM after collapsing at an appointment.  ? ?Patient is currently intubated and unable to participate in history - getting ready to do endo/colonoscopy at bedside and history obtained from chart review and discussion with primary team.  ? ?Brought to ER as Code Stroke - also with EKG changes initially concerning for acute STEMI, however later to be deemed more concerning for pericarditis by cardiology assessment.  ?[place] demonstrates subacute appearing infarct in the R PCA territory along with 2 small L MCA stroke. Taken for emergency thrombectomy and started empirically on IV antibiotics with high degree of suspicion for bacterial endocarditis (Vancomycin + Ceftriaxone + Azithromycin) after blood cultures were obtained. Later changed to [place] for consideration of PNA. TEE 4/17 revealed mitral valve vegetation 1cm x 0.5 cm without perforation --> re broadened to vanc/ceftriaxone ?Has had persistent fevers throughout hospitalization. Leukocytosis down-trending. So  far blood cultures x 2 sets are negative preliminarily (4d and 1d).  ? ?Other problems during this hospitalization --> lower GI bleeding requiring blood products, GI planning bedside upper/lower scope today and PPIs, pre diabetes with A1C 6%, AKI (improving), acute respiratory failure with concern over pneumonia on imaging.  ? ?Recent outpatient OV with PCP on 4/11 with 1 week history of severe fatigue, blurred  vision, body aches, fever and mild cough. Rx for Doxycycline for sinusitis tx.  ? ?Patient indicates he lives alone, not married, no children, and has living parents out of state.  Non smoker, occasional but not daily ETOH, denies drug abuse. [place] born. He indicated he had a dental cleaning within the last month.  ? ? ?Review of Systems: ?Review of Systems  ?Unable to perform ROS: Intubated  ? ? ? ?Past Medical History:  ?Diagnosis Date  ? Chest pain in adult 10/24/2017  ? Elevated BP without diagnosis of hypertension 10/24/2017  ? Impaired fasting glucose 10/24/2017  ? Mixed hyperlipidemia 10/24/2017  ? Rising PSA level 10/24/2017  ? ? ?  ? ?History reviewed. No pertinent family history. ?Allergies  ?Allergen Reactions  ? Pollen Extract Other (See Comments)  ?  Sneezing   ? ? ?OBJECTIVE: ?Blood pressure 126/82, pulse (!) 109, temperature (!) 101.5 ?F (38.6 ?C), temperature source Axillary, resp. rate (!) 32, height 5' 11.5" (1.816 m), weight 100.6 kg, SpO2 100 %. ? ?Physical Exam ?Vitals and nursing note reviewed.  ?Cardiovascular:  ?   Rate and Rhythm: Tachycardia present.  ?   Comments: ST noted on telemetry.  ?Neurological:  ?   Comments: Sedated for bedside procedure.   ? ? ?Lab Results ?Lab Results  ?Component Value Date  ? WBC 10.4 12/29/2021  ? HGB 9.5 (L) 12/29/2021  ? HCT 28.4 (L) 12/29/2021  ? MCV 93.3 12/29/2021  ? PLT 352 12/29/2021  ?  ?Lab Results  ?Component Value Date  ? CREATININE 1.81 (H) 12/29/2021  ? BUN 36 (H) 12/29/2021  ? NA 154 (H) 12/29/2021  ? K 3.6 12/29/2021  ? CL 125 (H) 12/29/2021  ? CO2 21 (L) 12/29/2021  ?  ?Lab Results  ?Component Value Date  ? ALT 24 12/27/2021  ? AST 33 12/27/2021  ? ALKPHOS 47 12/27/2021  ? BILITOT 0.6 12/27/2021  ?  ? ?Microbiology: ?Recent Results (from the past 240 hour(s))  ?Resp Panel by RT-PCR (Flu A&B, Covid) Nasopharyngeal Swab     Status: None  ? Collection Time: 12/25/21 11:14 AM  ? Specimen: Nasopharyngeal Swab; Nasopharyngeal(NP) swabs in vial  transport medium  ?Result Value Ref Range Status  ? SARS Coronavirus 2 by RT PCR NEGATIVE NEGATIVE Final  ?  Comment: (NOTE) ?SARS-CoV-2 target nucleic acids are NOT DETECTED. ? ?The SARS-CoV-2 RNA is generally detectable in upper respiratory ?specimens during the acute phase of infection. The lowest ?concentration of SARS-CoV-2 viral copies this assay can detect is ?138 copies/mL. A negative result does not preclude SARS-Cov-2 ?infection and should not be used as the sole basis for treatment or ?other patient management decisions. A negative result may occur with  ?improper specimen collection/handling, submission of specimen other ?than nasopharyngeal swab, presence of viral mutation(s) within the ?areas targeted by this assay, and inadequate number of viral ?copies(<138 copies/mL). A negative result must be combined with ?clinical observations, patient history, and epidemiological ?information. The expected result is Negative. ? ?Fact Sheet for Patients:  ?EntrepreneurPulse.com.au ? ?Fact Sheet for Healthcare Providers:  ?IncredibleEmployment.be ? ?This test is no t  yet approved or cleared by the [place] and  ?has been authorized for detection and/or diagnosis of SARS-CoV-2 by ?FDA under an Emergency Use Authorization (EUA). This EUA will remain  ?in effect (meaning this test can be used) for the duration of the ?COVID-19 declaration under Section 564(b)(1) of the Act, 21 ?U.S.C.section 360bbb-3(b)(1), unless the authorization is terminated  ?or revoked sooner.  ? ? ?  ? Influenza A by PCR NEGATIVE NEGATIVE Final  ? Influenza B by PCR NEGATIVE NEGATIVE Final  ?  Comment: (NOTE) ?The Xpert Xpress SARS-CoV-2/FLU/RSV plus assay is intended as an aid ?in the diagnosis of influenza from Nasopharyngeal swab specimens and ?should not be used as a sole basis for treatment. Nasal washings and ?aspirates are unacceptable for Xpert Xpress SARS-CoV-2/FLU/RSV ?testing. ? ?Fact  Sheet for Patients: ?EntrepreneurPulse.com.au ? ?Fact Sheet for Healthcare Providers: ?IncredibleEmployment.be ? ?This test is not yet approved or cleared by the [place] and ?

## 2021-12-29 NOTE — Progress Notes (Signed)
Patient transported on vent to MRI without complications.  After the MRI the patient was transported on vent to CT without complications.  After the CT the patient was transported on the vent back to [id number] without complications. ?

## 2021-12-29 NOTE — Progress Notes (Signed)
Pharmacy Antibiotic Note ? ?Vincent Ferrell is a 61 y.o. male admitted on 12/25/2021 with  Endocarditis .  Pharmacy has been consulted for Vancomycin dosing. With septic emboli to the brain, antibiotics will be adjusted for CNS penetration. Vancomycin will be dosed based on trough goal of 15-20. WBC is 10.4 and most recent temperature is 101.66F.  ? ?Plan: ?Vancomycin 750 mg IV every 12 hours, goal trough 15-20 ?Ceftriaxone 2g every 12 hours per MD ?Levels at steady state ?  ? ?Height: 5' 11.5" (181.6 cm) ?Weight: 100.6 kg (221 lb 12.5 oz) ?IBW/kg (Calculated) : 76.45 ? ?Temp (24hrs), Avg:99.3 ?F (37.4 ?C), Min:98 ?F (36.7 ?C), Max:102 ?F (38.9 ?C) ? ?Recent Labs  ?Lab 12/25/21 ?1315 12/25/21 ?1520 12/25/21 ?1923 12/25/21 ?2053 12/26/21 ?0230 12/27/21 ?0032 12/28/21 ?0025 12/28/21 ?1962 12/28/21 ?1023 12/28/21 ?1612 12/29/21 ?0617  ?WBC 20.6*  --   --   --  22.3* 14.8* 10.6* 12.2* 10.5  --  10.4  ?CREATININE  --   --    < > 1.98* 1.99* 2.33* 1.87*  --   --   --  1.81*  ?LATICACIDVEN 1.5 1.5  --  3.0*  --   --   --   --   --   --   --   ?VANCORANDOM  --   --   --   --   --   --   --   --   --  8  --   ? < > = values in this interval not displayed.  ? ?  ?Estimated Creatinine Clearance: 52.9 mL/min (A) (by C-G formula based on SCr of 1.81 mg/dL (H)).   ? ?Allergies  ?Allergen Reactions  ? Pollen Extract Other (See Comments)  ?  Sneezing   ? ? ?Antimicrobials this admission: ?Vancomycin 4/14 >>  ?Rocephin 4/14 x1 & 4/17 >> ?Azithromycin 4/14 x1 ?Unasyn 4/15 >> 4/17 ? ?Microbiology results: ?4/14 BCx: ngtd x 3 days ?4/14 Trach asp (does not appear to be collected at this point in time) ?4/14 MRSA PCR: neg ?4/17 Bcx: ngtd <24h ? ?Thank you for allowing pharmacy to participate in this patient's care. ? ?Enos Fling, PharmD ?PGY1 Pharmacy Resident ?12/29/2021 10:00 AM ?Check [email or web address] for unit specific pharmacy number ? ? ?

## 2021-12-29 NOTE — Progress Notes (Signed)
PT Cancellation Note ? ?Patient Details ?Name: Vincent Ferrell ?MRN: [id number] ?DOB: 07-Nov-1960 ? ? ?Cancelled Treatment:    Reason Eval/Treat Not Completed: Medical issues which prohibited therapy, remains intubated and sedated. Will follow as appropriate.  ? ?Vincent Ferrell, PT  ?Acute Rehab Services ? Pager [phone] ?Office [phone] ? ? ? ?Vincent Ferrell ?12/29/2021, 9:30 AM ?

## 2021-12-29 NOTE — Progress Notes (Signed)
? ?NAME:  Vincent Ferrell, MRN:  [id number], DOB:  02-24-1961, LOS: 4 ?ADMISSION DATE:  12/25/2021, CONSULTATION DATE:  12/25/2021 ?REFERRING MD:  Dr. Estanislado Pandy, CHIEF COMPLAINT:  left sided weakness  ? ?History of Present Illness:  ?HPI mostly obtained from medical chart review.  Attempted from patient but limited given aphasia.  ? ?61 year old male with prior history of HTN, HLD, and GERD who presented to ER as code stroke.  Patient was at the vet's office when he acutely developed left sided weakness and right gaze deviation at 0945.  Code stroke activated by EMS in addition to code STEMI based on diffuse STE.  Of note, patient recently diagnosed with RLL bronchopneumonia two days ago by his PCP.   ? ?He was evaluated by Neurology and Cardiology in ER, HR in 150s with no chest pain or dyspnea, with EKG not meeting criteria for STEMI but showed mild STE, felt possibly rate related versus possible pericarditis.  Echo and cardiac workup ordered.  CT head showed concern for subacute infarcts in the R PCA territory and two small areas possibly representing calcification or blood, therefore thrombolytics deferred.  CTA head/ neck and perfusion study showed acute right MCA anterior M2 occlusion.   ?Labs significant for WBC 20.6, Hgb 9.2, Hct 26.8, plts 205, normal coags, K 4.9, bicarb 21, glucose 138, BUN 34, sCr 1.83, iCa 1.03, low albumin/ protein, lactic acid 1.5. Trop hs pending.  CXR showing elevation of right hemidiaphragm with pulmonary vascular congestion.  Blood cultures sent.   Temp 100 in ER, remains ST, and initial BP 140/90, and remains on room air > 94%.  He was taken emergently to Neuro IR for mechanical thrombectomy with complete revascularization achieving TICI 3 revascularization.  Patient was extubated post intervention.  Briefly required phenylephrine in PACU.  Received 2L LR and albumin in procedure.  PCCM consulted for further medical assistance given concern for sepsis and possible cardioembolic  event related to stroke. ? ?Patient is currently only able to nod yes/ no to question given expressive aphasia and mostly incomprehensible speech.  Nodded yes to recent fever, cough with productive sputum, and headache.  Nods yes to recent bloody stools.  Did indicate that he started antibiotics from PCP.  Denied current or previous SOB, CP, palpitations, N/V/abd pain, lower leg swelling.   Patient indicates he lives alone, not married, no children, and has living parents out of state.  Non smoker, occasional but not daily ETOH, denies drug abuse.  ? ?Pertinent  Medical History  ?HLT, HLD, GERD, rising PSA level, impaired fasting glucose ? 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  Cards consulted for initial code STEMI with diffuse STE, no STEMI. Required 4 units of pRBCs for lower GI bleed.  ?4/17: Large bloody BM. Bedside colonoscopy with blood throughout colon. 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 Hemoglobin decreased to 6.8. Additional 2 units of pRBCs and FFP given.  ? ?Interim History / Subjective:  ? ?Febrile overnight up to 102. Blood pressures improved significantly and Levophed was discontinued.  ?Repeat hemoglobin this morning improved to 9.3 then 8.4.  ? ?Objective   ?Blood pressure 117/81, pulse (!) 111, temperature (!) 102 ?F (38.9 ?C), temperature source Axillary, resp. rate (!) 30, height 5' 11.5" (1.816 m), weight 100.6 kg, SpO2 100 %. ?   ?Vent Mode: PRVC ?FiO2 (%):  [30 %-97 %]  30 % ?Set Rate:  [30 bmp] 30 bmp ?Vt Set:  [600 mL] 600 mL ?PEEP:  [5 cmH20] 5 cmH20 ?Plateau Pressure:  [19 cmH20-29 cmH20] 21 cmH20  ? ?Intake/Output Summary (Last 24 hours) at 12/29/2021 0802 ?Last data filed at 12/29/2021 0800 ?Gross per 24 hour  ?Intake 2976.77 ml  ?Output 2550 ml  ?Net 426.77 ml  ? ? ?Filed Weights  ? 12/25/21 1000  12/27/21 0500  ?Weight: 100.8 kg 100.6 kg  ? ?Examination:  ?General: Acutely ill-appearing middle-age male, lying on the bed, orally intubated ?HEENT: Moist mucus membranes. ETT/OGT in place ?Neuro: Able to move right upper and bilateral lower extremities. Unable to move left upper extremity. Sensation intact throughout. Cough and gag intact.   ?CV: Regular rate and rhythm. No rub or murmurs.  ?PULM: Coarse breath sounds. No rales or rhonchi.  ?GI: Soft, hypoactive bowel sounds present. ?Extremities: warm/dry, trace bilateral pitting edema ?Skin: See pictures.  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  ? ?Resolved Hospital Problem list   ?Septic Shock, resolved  ? ?Assessment & Plan:  ? ?Mitral Valve Endocarditis  ?- TEE on 4/17 with 1cm x 0.5 cm vegetation involving both atrial and ventricular sides.  ?No significant stenosis or regurgitation  ?- Blood cultures negative on admission, however on Doxycycline PTA ?- Source unidentified. Recent dental procedure within the last month. GI bleed places at risk for translocation.  ?- Infectious Disease consulted ?- Broadened antibiotics to Vancomycin and Ceftriaxone  ? ?Acute R MCA, with M2 occlusion s/p mechanical thrombectomy with complete revascularization ?- Stroke team is following ?- Continue secondary stroke prophylaxis ?- SBP goal 120-140 ?- Continue antiplatelet ?- Continue high intensity statin, Lipitor  ? ?Acute hypoxic respiratory failure ?Multi-focal pneumonia ?- Diagnosed with RLL pneumonia PTA on Doxycycline. CTA on admission with results concerning for multi-focal pneumonia involving bilateral upper and lower lobes ?- Continue lung protective ventilation ?- VAP bundle ?- PAD protocol with RASS goal 0/-1, currently on Precedex and fentanyl PRN ?- IV Ceftriaxone for coverage of aspiration pneumonia ?- Tracheal aspirate cultures pending  ? ?Acute blood loss anemia s/p multiple pRBC  transfusions ?Lower GI bleeding ?- Patient endorsed blood stools PTA. Significant lower GI bleed throughout hospitalization.  ?- GI following; appreciate their recommendations ?- Bedside colonoscopy on 4/17 with blood throughout colon however source not identified.  ?- Required 2 additional units of pRBCs and FFP on 4/17 ?- Plan for EGD and repeat colonoscopy today  ? ?AKI, due to septic ATN versus contrast-induced ?- Renal function stable today. Good UOP over last 24 hours at 2.5L ?- Monitor intake and output ?- Avoid nephrotoxic agents ?- Daily BMP  ? ?Diffuse mild STE ?Demand cardiac ischemia ?- Suspected to be related to sepsis vs stress demand ? ?Prediabetes with hyperglycemia ?- Patient's hemoglobin A1c is 6 ?- Continue sliding scale insulin with CBG goal 140-180 ? ?Ileus/partial small bowel obstruction ?- No NGT output over last 24 hours documented  ?- Holding tube feeds ?- Suspect it has resolved  ? ?Prostate Gland Enlargement ?- Noted on recent CTA, area of lobulation within the inferolateral portion of the prostate.  ?- PSA elevated at 25.7. Previously mildly elevated at 1.6. ?- Will need outpatient Urology follow up for consideration of biopsy   ? ?Best Practice (right click and "Reselect all SmartList Selections" daily)  ? ?Diet/type: NPO ?DVT prophylaxis: SCDs ?GI prophylaxis: PPI ?Lines: Left [place] central line. Still needed.  ?Foley:  N/A ?  Code Status:  full code ?Last date of multidisciplinary goals of care discussion [4/17, patient's girlfriend was updated at bedside. Patient's sister updated via telephone] ?  ?Labs   ?CBC: ?Recent Labs  ?Lab 12/25/21 ?1048 12/25/21 ?1315 12/26/21 ?0230 12/27/21 ?0032 12/28/21 ?0025 12/28/21 ?[id number] 12/28/21 ?1023 12/28/21 ?1553 12/28/21 ?1626 12/28/21 ?2101 12/29/21 ?0617  ?WBC 20.0*   < > 22.3* 14.8* 10.6* 12.2* 10.5  --   --   --  10.4  ?NEUTROABS 16.3*  --  17.1*  --   --   --   --   --   --   --   --   ?HGB 11.8*   < > 9.6* 7.9* 7.1* 7.6* 6.8* 6.8* 5.8* 9.3* 8.4*   ?HCT 35.0*   < > 27.2* 23.0* 22.1* 23.8* 22.4* 21.5* 17.0* 28.7* 26.3*  ?MCV 91.1   < > 85.5 88.5 91.7 92.6 94.5  --   --   --  93.3  ?PLT 199   < > 202 205 286 332 334  --   --   --  352  ? < > = values in this inte

## 2021-12-29 NOTE — Op Note (Signed)
[facility] ?Patient Name: Vincent Ferrell ?Procedure Date : 12/29/2021 ?MRN: [id number] ?Attending MD: Thornton Park MD, MD ?Date of Birth: April 13, 1961 ?CSN: [id number] ?Age: 61 ?Admit Type: Inpatient ?Procedure:                Upper GI endoscopy ?Indications:              Gastrointestinal bleeding of unknown origin, recent  ?                          hematochezia, GI blood loss anemia requiring PRBCs ?Providers:                Thornton Park MD, MD, Dulcy Fanny, Primitivo Gauze  ?                          [place], Technician, [employer],  ?                          Technician ?Referring MD:              ?Medicines:                None ?Complications:            No immediate complications. ?Estimated Blood Loss:     Estimated blood loss: none. ?Procedure:                Pre-Anesthesia Assessment: ?                          - Prior to the procedure, a History and Physical  ?                          was performed, and patient medications and  ?                          allergies were reviewed. The patient's tolerance of  ?                          previous anesthesia was also reviewed. The risks  ?                          and benefits of the procedure and the sedation  ?                          options and risks were discussed with the patient.  ?                          All questions were answered, and informed consent  ?                          was obtained. Prior Anticoagulants: The patient has  ?                          taken no previous anticoagulant or antiplatelet  ?                          agents. ASA Grade  Assessment: III - A patient with  ?                          severe systemic disease. After reviewing the risks  ?                          and benefits, the patient was deemed in  ?                          satisfactory condition to undergo the procedure. ?                          After obtaining informed consent, the endoscope was  ?                          passed  under direct vision. Throughout the  ?                          procedure, the patient's blood pressure, pulse, and  ?                          oxygen saturations were monitored continuously. The  ?                          [id number] Olympus endoscope was introduced  ?                          through the mouth, and advanced to the third part  ?                          of duodenum. The upper GI endoscopy was  ?                          accomplished without difficulty. The patient  ?                          tolerated the procedure well. ?Scope In: ?Scope Out: ?Findings: ?     The esophagus was normal. ?     A hiatal hernia was present. ?     Multiple small sessile polyps with no bleeding and no stigmata of recent  ?     bleeding were found in the gastric fundus and in the gastric body. ?     Multiple localized erosions with no bleeding and no stigmata of recent  ?     bleeding were found in the gastric body and antrum. Throughout the  ?     antrum they appeared linear and consistent with NG-tube trauma. There  ?     are additional erosions in the antrum. ?     The examined duodenum was normal except for focal erythema. ?Impression:               - Normal esophagus. ?                          - Hiatal hernia. ?                          -  Multiple gastric polyps. ?                          - Erosive gastropathy with no bleeding and no  ?                          stigmata of recent bleeding. ?                          - Mild duodenopathy. ?                          - No evidence for active or recent bleeding. ?Recommendation:           - NPO. ?                          - Continue present medications. ?                          - Consider CTA with any recurrent bleeding. ?                          - Avoid NSAIDs as able. ?                          - PPI BID. ?Procedure Code(s):        --- Professional --- ?                          [phone], Esophagogastroduodenoscopy, flexible,  ?                           transoral; diagnostic, including collection of  ?                          specimen(s) by brushing or washing, when performed  ?                          (separate procedure) ?Diagnosis Code(s):        --- Professional --- ?                          K44.9, Diaphragmatic hernia without obstruction or  ?                          gangrene ?                          K31.7, Polyp of stomach and duodenum ?                          K31.89, Other diseases of stomach and duodenum ?                          K92.2, Gastrointestinal hemorrhage, unspecified ?CPT copyright 2019 [employer]. All rights reserved. ?The codes documented in this report are preliminary and upon coder review may  ?be revised to meet current compliance  requirements. ?Thornton Park MD, MD ?12/29/2021 12:41:26 PM ?This report has been signed electronically. ?Number of Addenda: 0 ?

## 2021-12-29 NOTE — Progress Notes (Addendum)
STROKE TEAM PROGRESS NOTE  ? ?SUBJECTIVE (INTERVAL HISTORY) ?His girlfriend and RN are at the bedside.  Patient still intubated, sedated for coloscopy this morning. TEE confirmed MV endocarditis yesterday. ID on board expanded abx coverage.  ? ?Appreciate CCM, ID and GI assistance.  ? ?OBJECTIVE ?Temp:  [98 ?F (36.7 ?C)-102 ?F (38.9 ?C)] 101.5 ?F (38.6 ?C) (04/18 0800) ?Pulse Rate:  [60-119] 118 (04/18 1208) ?Cardiac Rhythm: Normal sinus rhythm (04/18 0800) ?Resp:  [18-41] 24 (04/18 1208) ?BP: (85-174)/(49-97) 174/96 (04/18 1208) ?SpO2:  [81 %-100 %] 96 % (04/18 1208) ?FiO2 (%):  [30 %-97 %] 30 % (04/18 1208) ? ?Recent Labs  ?Lab 12/28/21 ?1928 12/28/21 ?2347 12/29/21 ?7209 12/29/21 ?4709 12/29/21 ?1244  ?GLUCAP 154* 98 83 101* 120*  ? ? ?Recent Labs  ?Lab 12/25/21 ?2053 12/25/21 ?2209 12/26/21 ?0230 12/27/21 ?0032 12/28/21 ?0025 12/28/21 ?1626 12/29/21 ?0617  ?NA 140   < > 140 143 149* 152* 154*  ?K 6.5*   < > 4.9 4.6 4.2 4.0 3.6  ?CL 113*  --  112* 113* 124*  --  125*  ?CO2 19*  --  20* 22 20*  --  21*  ?GLUCOSE 241*  --  299* 146* 143*  --  131*  ?BUN 40*  --  40* 53* 46*  --  36*  ?CREATININE 1.98*  --  1.99* 2.33* 1.87*  --  1.81*  ?CALCIUM 6.7*  --  6.6* 7.0* 7.4*  --  7.9*  ? < > = values in this interval not displayed.  ? ? ?Recent Labs  ?Lab 12/25/21 ?1048 12/27/21 ?0032  ?AST 29 33  ?ALT 34 24  ?ALKPHOS 100 47  ?BILITOT 0.5 0.6  ?PROT 5.7* 4.2*  ?ALBUMIN 2.1* 1.6*  ? ? ?Recent Labs  ?Lab 12/25/21 ?1048 12/25/21 ?1315 12/26/21 ?0230 12/27/21 ?0032 12/28/21 ?0025 12/28/21 ?6283 12/28/21 ?1023 12/28/21 ?1553 12/28/21 ?1626 12/28/21 ?2101 12/29/21 ?0617 12/29/21 ?6629  ?WBC 20.0*   < > 22.3* 14.8* 10.6* 12.2* 10.5  --   --   --  10.4  --   ?NEUTROABS 16.3*  --  17.1*  --   --   --   --   --   --   --   --   --   ?HGB 11.8*   < > 9.6* 7.9* 7.1* 7.6* 6.8* 6.8* 5.8* 9.3* 8.4* 9.5*  ?HCT 35.0*   < > 27.2* 23.0* 22.1* 23.8* 22.4* 21.5* 17.0* 28.7* 26.3* 28.4*  ?MCV 91.1   < > 85.5 88.5 91.7 92.6 94.5  --   --   --   93.3  --   ?PLT 199   < > 202 205 286 332 334  --   --   --  352  --   ? < > = values in this interval not displayed.  ? ? ?No results for input(s): CKTOTAL, CKMB, CKMBINDEX, TROPONINI in the last 168 hours. ?No results for input(s): LABPROT, INR in the last 72 hours. ? ?No results for input(s): COLORURINE, LABSPEC, PHURINE, GLUCOSEU, HGBUR, BILIRUBINUR, KETONESUR, PROTEINUR, UROBILINOGEN, NITRITE, LEUKOCYTESUR in the last 72 hours. ? ?Invalid input(s): APPERANCEUR ?  ?   ?Component Value Date/Time  ? CHOL 89 12/26/2021 0230  ? TRIG 222 (H) 12/29/2021 0617  ? HDL <10 (L) 12/26/2021 0230  ? CHOLHDL NOT CALCULATED 12/26/2021 0230  ? VLDL 43 (H) 12/26/2021 0230  ? LDLCALC NOT CALCULATED 12/26/2021 0230  ? ?Lab Results  ?Component Value Date  ? HGBA1C 6.0 (H) 12/25/2021  ? ?   ?  Component Value Date/Time  ? LABOPIA NONE DETECTED 12/26/2021 0116  ? COCAINSCRNUR NONE DETECTED 12/26/2021 0116  ? LABBENZ NONE DETECTED 12/26/2021 0116  ? AMPHETMU NONE DETECTED 12/26/2021 0116  ? THCU NONE DETECTED 12/26/2021 0116  ? LABBARB NONE DETECTED 12/26/2021 0116  ?  ?No results for input(s): ETH in the last 168 hours. ? ?I have personally reviewed the radiological images below and agree with the radiology interpretations. ? ?DG Abd 1 View ? ?Result Date: 12/25/2021 ?CLINICAL DATA:  Orogastric tube placement. EXAM: ABDOMEN - 1 VIEW COMPARISON:  None. FINDINGS: An orogastric tube is seen with its distal tip overlying the expected region of the gastric antrum. The bowel gas pattern is normal. No radio-opaque calculi or other significant radiographic abnormality are seen. IMPRESSION: Orogastric tube positioning, as described above. Electronically Signed   By: Aram Candela M.D.   On: 12/25/2021 19:09  ? ?DG Abd 1 View ? ?Result Date: 12/25/2021 ?CLINICAL DATA:  OG tube placement. EXAM: ABDOMEN - 1 VIEW COMPARISON:  One view chest x-ray 12:45 p.m. FINDINGS: Gaseous distention of the stomach noted. Bowel gas pattern otherwise  unremarkable. OG tube is not visualized over the distal esophagus or stomach. IMPRESSION: OG tube is not visualized over the distal esophagus or stomach. Gaseous distention of the stomach. Electronically Signed   By: Marin Roberts M.D.   On: 12/25/2021 19:06  ? ?MR ANGIO HEAD WO CONTRAST ? ?Result Date: 12/26/2021 ?CLINICAL DATA:  Acute infarct. Revascularization of superior right M2 segment. EXAM: MRA HEAD WITHOUT CONTRAST TECHNIQUE: Angiographic images of the Circle of Willis were acquired using MRA technique without intravenous contrast. COMPARISON:  CTA head and neck 12/25/2021.  Revascularization films. FINDINGS: Anterior circulation: The internal carotid arteries are within normal limits from high cervical segments through the ICA termini bilaterally. The A1 and M1 segments are normal. MCA bifurcations are within normal limits. MCA bifurcations are intact. The revascularized superior right M2 segments remain patent. ACA and MCA branch vessels are normal bilaterally. Posterior circulation: The vertebral arteries are codominant. AICA vessels are dominant. The basilar artery is within normal limits. Right posterior cerebral artery originates from basilar tip. Left posterior cerebral artery is of fetal type. A left P1 segment contributes. PCA branch vessels are within normal limits bilaterally. Anatomic variants: None Other: None. IMPRESSION: 1. Normal variant MRA Circle of Willis without evidence for significant proximal stenosis, aneurysm, or branch vessel occlusion. 2. The revascularized superior right M2 segments remain patent. Electronically Signed   By: Marin Roberts M.D.   On: 12/26/2021 16:54  ? ?MR BRAIN WO CONTRAST ? ?Result Date: 12/26/2021 ?CLINICAL DATA:  Status post revascularization of right superior M2 occlusion. EXAM: MRI HEAD WITHOUT CONTRAST TECHNIQUE: Multiplanar, multiecho pulse sequences of the brain and surrounding structures were obtained without intravenous contrast.  COMPARISON:  CTA head and neck, CT perfusion, and revascularization images 12/25/21 FINDINGS: Brain: Diffusion-weighted images demonstrate acute hemorrhage within the superior right MCA division involving the right insular cortex and frontal lobe. Diffuse cortical infarct involves the high right parietal cortex. Sparing of the more inferior parietal lobe and superior temporal lobe noted. Focal area of nonhemorrhagic infarct is present in the right occipital pole. Contralateral left acute parietal and posterior left frontal lobe cortical infarcts are present as well. T2 hyperintensities are associated with the areas of acute/subacute infarction. Focal susceptibility involving the left parietal infarct noted. Additional areas of cortical hemorrhage are present in the high posterior right parietal lobe and in the right frontal operculum. Smaller foci of  hemorrhage are present in the posterior left frontal infarct. Minimal white matter changes are present otherwise. The ventricles are of normal size. No significant extraaxial fluid collection is present. The internal auditory canals are within normal limits. The brainstem and cerebellum are within normal limits. Vascular: Flow is present in the major intracranial arteries. Skull and upper cervical spine: The craniocervical junction is normal. Upper cervical spine is within normal limits. Marrow signal is unremarkable. Sinuses/Orbits: Bilateral mastoid effusions are present. There is some fluid in the posterior nasopharynx. OG tube and endotracheal tube are in place. The paranasal sinuses and mastoid air cells are otherwise clear. The globes and orbits are within normal limits. IMPRESSION: 1. 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 2. Contralateral acute/subacute cortical infarcts involving the left parietal and posterior left frontal lobe. 3. Focal area of  acute/subacute infarct in the right occipital pole. 4. Focal cortical hemorrhage within scattered infarcts involving the left parietal and frontal lobes. Additional cortical hemorrhage in the high posterior right parietal lobe

## 2021-12-29 NOTE — Progress Notes (Signed)
OT Cancellation Note ? ?Patient Details ?Name: Vincent Ferrell ?MRN: [id number] ?DOB: Feb 17, 1961 ? ? ?Cancelled Treatment:    Reason Eval/Treat Not Completed: Medical issues which prohibited therapy (Per RN pt remains intubated/sedated, OT to f/u when pt is more appropriate for therapy evaluation) ? ?[name] ?12/29/2021, 9:08 AM ?

## 2021-12-30 ENCOUNTER — Encounter (HOSPITAL_COMMUNITY): Payer: Self-pay | Admitting: Gastroenterology

## 2021-12-30 DIAGNOSIS — I330 Acute and subacute infective endocarditis: Secondary | ICD-10-CM | POA: Diagnosis not present

## 2021-12-30 DIAGNOSIS — D500 Iron deficiency anemia secondary to blood loss (chronic): Secondary | ICD-10-CM

## 2021-12-30 DIAGNOSIS — N412 Abscess of prostate: Secondary | ICD-10-CM | POA: Diagnosis not present

## 2021-12-30 DIAGNOSIS — J9601 Acute respiratory failure with hypoxia: Secondary | ICD-10-CM | POA: Diagnosis not present

## 2021-12-30 DIAGNOSIS — J189 Pneumonia, unspecified organism: Secondary | ICD-10-CM

## 2021-12-30 DIAGNOSIS — I059 Rheumatic mitral valve disease, unspecified: Secondary | ICD-10-CM | POA: Diagnosis not present

## 2021-12-30 DIAGNOSIS — I63511 Cerebral infarction due to unspecified occlusion or stenosis of right middle cerebral artery: Secondary | ICD-10-CM | POA: Diagnosis not present

## 2021-12-30 DIAGNOSIS — N179 Acute kidney failure, unspecified: Secondary | ICD-10-CM | POA: Diagnosis not present

## 2021-12-30 DIAGNOSIS — K921 Melena: Secondary | ICD-10-CM

## 2021-12-30 HISTORY — DX: Pneumonia, unspecified organism: J18.9

## 2021-12-30 LAB — CULTURE, BLOOD (ROUTINE X 2)
Culture: NO GROWTH
Culture: NO GROWTH
Special Requests: ADEQUATE

## 2021-12-30 LAB — POCT I-STAT 7, (LYTES, BLD GAS, ICA,H+H)
Acid-base deficit: 4 mmol/L — ABNORMAL HIGH (ref 0.0–2.0)
Bicarbonate: 21.1 mmol/L (ref 20.0–28.0)
Calcium, Ion: 1.17 mmol/L (ref 1.15–1.40)
HCT: 25 % — ABNORMAL LOW (ref 39.0–52.0)
Hemoglobin: 8.5 g/dL — ABNORMAL LOW (ref 13.0–17.0)
O2 Saturation: 96 %
Patient temperature: 98.6
Potassium: 4.1 mmol/L (ref 3.5–5.1)
Sodium: 150 mmol/L — ABNORMAL HIGH (ref 135–145)
TCO2: 22 mmol/L (ref 22–32)
pCO2 arterial: 35.4 mmHg (ref 32–48)
pH, Arterial: 7.383 (ref 7.35–7.45)
pO2, Arterial: 86 mmHg (ref 83–108)

## 2021-12-30 LAB — BASIC METABOLIC PANEL
Anion gap: 8 (ref 5–15)
BUN: 23 mg/dL — ABNORMAL HIGH (ref 6–20)
CO2: 22 mmol/L (ref 22–32)
Calcium: 7.5 mg/dL — ABNORMAL LOW (ref 8.9–10.3)
Chloride: 118 mmol/L — ABNORMAL HIGH (ref 98–111)
Creatinine, Ser: 1.52 mg/dL — ABNORMAL HIGH (ref 0.61–1.24)
GFR, Estimated: 52 mL/min — ABNORMAL LOW (ref >60)
Glucose, Bld: 130 mg/dL — ABNORMAL HIGH (ref 70–99)
Potassium: 4 mmol/L (ref 3.5–5.1)
Sodium: 148 mmol/L — ABNORMAL HIGH (ref 135–145)

## 2021-12-30 LAB — CBC
HCT: 26.3 % — ABNORMAL LOW (ref 39.0–52.0)
Hemoglobin: 8.4 g/dL — ABNORMAL LOW (ref 13.0–17.0)
MCH: 30.8 pg (ref 26.0–34.0)
MCHC: 31.9 g/dL (ref 30.0–36.0)
MCV: 96.3 fL (ref 80.0–100.0)
Platelets: 379 10*3/uL (ref 150–400)
RBC: 2.73 MIL/uL — ABNORMAL LOW (ref 4.22–5.81)
RDW: 17 % — ABNORMAL HIGH (ref 11.5–15.5)
WBC: 22.8 10*3/uL — ABNORMAL HIGH (ref 4.0–10.5)
nRBC: 0.1 % (ref 0.0–0.2)

## 2021-12-30 LAB — HEMOGLOBIN AND HEMATOCRIT, BLOOD
HCT: 29.1 % — ABNORMAL LOW (ref 39.0–52.0)
HCT: 26.9 % — ABNORMAL LOW (ref 39.0–52.0)
HCT: 32.1 % — ABNORMAL LOW (ref 39.0–52.0)
HCT: 25.6 % — ABNORMAL LOW (ref 39.0–52.0)
Hemoglobin: 8.9 g/dL — ABNORMAL LOW (ref 13.0–17.0)
Hemoglobin: 8.7 g/dL — ABNORMAL LOW (ref 13.0–17.0)
Hemoglobin: 10.1 g/dL — ABNORMAL LOW (ref 13.0–17.0)
Hemoglobin: 8.2 g/dL — ABNORMAL LOW (ref 13.0–17.0)

## 2021-12-30 LAB — GLUCOSE, CAPILLARY
Glucose-Capillary: 94 mg/dL (ref 70–99)
Glucose-Capillary: 93 mg/dL (ref 70–99)
Glucose-Capillary: 109 mg/dL — ABNORMAL HIGH (ref 70–99)
Glucose-Capillary: 100 mg/dL — ABNORMAL HIGH (ref 70–99)
Glucose-Capillary: 121 mg/dL — ABNORMAL HIGH (ref 70–99)
Glucose-Capillary: 140 mg/dL — ABNORMAL HIGH (ref 70–99)

## 2021-12-30 LAB — LEGIONELLA PNEUMOPHILA SEROGP 1 UR AG: L. pneumophila Serogp 1 Ur Ag: NEGATIVE

## 2021-12-30 MED ORDER — FREE WATER
200.0000 mL | Status: DC
  Administered 2021-12-30 – 2021-12-31 (×7): 200 mL

## 2021-12-30 MED ORDER — HYDROCODONE-ACETAMINOPHEN 5-325 MG PO TABS
1.0000 | ORAL_TABLET | Freq: Four times a day (QID) | ORAL | Status: DC | PRN
  Administered 2021-12-30 – 2022-01-06 (×3): 2 via ORAL
  Filled 2021-12-30 (×3): qty 2

## 2021-12-30 MED ORDER — VANCOMYCIN HCL 1250 MG/250ML IV SOLN
1250.0000 mg | Freq: Two times a day (BID) | INTRAVENOUS | Status: DC
Start: 2021-12-30 — End: 2022-01-01
  Administered 2021-12-30 – 2021-12-31 (×4): 1250 mg via INTRAVENOUS
  Filled 2021-12-30 (×5): qty 250

## 2021-12-30 MED ORDER — PROSOURCE TF PO LIQD
45.0000 mL | Freq: Two times a day (BID) | ORAL | Status: DC
  Administered 2021-12-30 – 2022-01-07 (×15): 45 mL
  Filled 2021-12-30 (×15): qty 45

## 2021-12-30 MED ORDER — LACTATED RINGERS IV BOLUS
500.0000 mL | Freq: Once | INTRAVENOUS | Status: AC
  Administered 2021-12-30: 500 mL via INTRAVENOUS

## 2021-12-30 MED ORDER — VITAL 1.5 CAL PO LIQD
1000.0000 mL | ORAL | Status: DC
  Administered 2021-12-30: 1000 mL

## 2021-12-30 MED ORDER — POLYETHYLENE GLYCOL 3350 17 G PO PACK
17.0000 g | PACK | Freq: Every day | ORAL | Status: DC
  Administered 2021-12-30 – 2022-01-04 (×3): 17 g
  Filled 2021-12-30 (×4): qty 1

## 2021-12-30 MED ORDER — DEXMEDETOMIDINE HCL IN NACL 400 MCG/100ML IV SOLN
0.0000 ug/kg/h | INTRAVENOUS | Status: DC
  Administered 2021-12-30: 0.2 ug/kg/h via INTRAVENOUS
  Administered 2021-12-30: 0.4 ug/kg/h via INTRAVENOUS
  Filled 2021-12-30: qty 100

## 2021-12-30 NOTE — Progress Notes (Signed)
STROKE TEAM PROGRESS NOTE  ? ?SUBJECTIVE (INTERVAL HISTORY) ?His girlfriend and RN are at the bedside.  Patient still intubated, just off sedation. 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 ID on board. Also on Abx for MV endocarditis. GIB has subsided.   ? ?OBJECTIVE ?Temp:  [98.5 ?F (36.9 ?C)-102.4 ?F (39.1 ?C)] 101.1 ?F (38.4 ?C) (04/19 1600) ?Pulse Rate:  [95-126] 95 (04/19 1700) ?Cardiac Rhythm: Sinus tachycardia (04/19 0800) ?Resp:  [19-31] 21 (04/19 1700) ?BP: (93-167)/(72-91) 123/82 (04/19 1700) ?SpO2:  [91 %-100 %] 100 % (04/19 1700) ?FiO2 (%):  [30 %-40 %] 40 % (04/19 1405) ? ?Recent Labs  ?Lab 12/29/21 ?2344 12/30/21 ?0325 12/30/21 ?[id number] 12/30/21 ?1101 12/30/21 ?1524  ?GLUCAP 121* 94 93 109* 100*  ? ?Recent Labs  ?Lab 12/26/21 ?0230 12/27/21 ?0032 12/28/21 ?0025 12/28/21 ?1626 12/29/21 ?0617 12/30/21 ?0301 12/30/21 ?[id number]  ?NA 140 143 149* 152* 154* 148* 150*  ?K 4.9 4.6 4.2 4.0 3.6 4.0 4.1  ?CL 112* 113* 124*  --  125* 118*  --   ?CO2 20* 22 20*  --  21* 22  --   ?GLUCOSE 299* 146* 143*  --  131* 130*  --   ?BUN 40* 53* 46*  --  36* 23*  --   ?CREATININE 1.99* 2.33* 1.87*  --  1.81* 1.52*  --   ?CALCIUM 6.6* 7.0* 7.4*  --  7.9* 7.5*  --   ? ?Recent Labs  ?Lab 12/25/21 ?1048 12/27/21 ?0032  ?AST 29 33  ?ALT 34 24  ?ALKPHOS 100 47  ?BILITOT 0.5 0.6  ?PROT 5.7* 4.2*  ?ALBUMIN 2.1* 1.6*  ? ?Recent Labs  ?Lab 12/25/21 ?1048 12/25/21 ?1315 12/26/21 ?0230 12/27/21 ?0032 12/28/21 ?0025 12/28/21 ?[id number] 12/28/21 ?1023 12/28/21 ?1553 12/29/21 ?0617 12/29/21 ?[id number] 12/29/21 ?2101 12/30/21 ?0301 12/30/21 ?[id number] 12/30/21 ?[id number] 12/30/21 ?1504  ?WBC 20.0*   < > 22.3*   < > 10.6* 12.2* 10.5  --  10.4  --   --  22.8*  --   --   --   ?NEUTROABS 16.3*  --  17.1*  --   --   --   --   --   --   --   --   --   --   --   --   ?HGB 11.8*   < > 9.6*   < > 7.1* 7.6* 6.8*   < > 8.4*   < > 8.9* 8.4* 8.5* 8.7* 10.1*  ?HCT 35.0*   < > 27.2*   < > 22.1* 23.8* 22.4*   < > 26.3*   < > 29.1* 26.3* 25.0* 26.9* 32.1*  ?MCV 91.1   < > 85.5    < > 91.7 92.6 94.5  --  93.3  --   --  96.3  --   --   --   ?PLT 199   < > 202   < > 286 332 334  --  352  --   --  379  --   --   --   ? < > = values in this interval not displayed.  ? ?No results for input(s): CKTOTAL, CKMB, CKMBINDEX, TROPONINI in the last 168 hours. ?No results for input(s): LABPROT, INR in the last 72 hours. ? ?No results for input(s): COLORURINE, LABSPEC, PHURINE, GLUCOSEU, HGBUR, BILIRUBINUR, KETONESUR, PROTEINUR, UROBILINOGEN, NITRITE, LEUKOCYTESUR in the last 72 hours. ? ?Invalid input(s): APPERANCEUR ?  ?   ?Component Value Date/Time  ? CHOL 89 12/26/2021 0230  ? TRIG 222 (  H) 12/29/2021 0617  ? HDL <10 (L) 12/26/2021 0230  ? CHOLHDL NOT CALCULATED 12/26/2021 0230  ? VLDL 43 (H) 12/26/2021 0230  ? LDLCALC NOT CALCULATED 12/26/2021 0230  ? ?Lab Results  ?Component Value Date  ? HGBA1C 6.0 (H) 12/25/2021  ? ?   ?Component Value Date/Time  ? LABOPIA NONE DETECTED 12/26/2021 0116  ? COCAINSCRNUR NONE DETECTED 12/26/2021 0116  ? LABBENZ NONE DETECTED 12/26/2021 0116  ? AMPHETMU NONE DETECTED 12/26/2021 0116  ? THCU NONE DETECTED 12/26/2021 0116  ? LABBARB NONE DETECTED 12/26/2021 0116  ?  ?No results for input(s): ETH in the last 168 hours. ? ?I have personally reviewed the radiological images below and agree with the radiology interpretations. ? ?DG Abd 1 View ? ?Result Date: 12/25/2021 ?CLINICAL DATA:  Orogastric tube placement. EXAM: ABDOMEN - 1 VIEW COMPARISON:  None. FINDINGS: An orogastric tube is seen with its distal tip overlying the expected region of the gastric antrum. The bowel gas pattern is normal. No radio-opaque calculi or other significant radiographic abnormality are seen. IMPRESSION: Orogastric tube positioning, as described above. Electronically Signed   By: Aram Candela M.D.   On: 12/25/2021 19:09  ? ?DG Abd 1 View ? ?Result Date: 12/25/2021 ?CLINICAL DATA:  OG tube placement. EXAM: ABDOMEN - 1 VIEW COMPARISON:  One view chest x-ray 12:45 p.m. FINDINGS: Gaseous distention  of the stomach noted. Bowel gas pattern otherwise unremarkable. OG tube is not visualized over the distal esophagus or stomach. IMPRESSION: OG tube is not visualized over the distal esophagus or stomach. Gaseous distention of the stomach. Electronically Signed   By: Marin Roberts M.D.   On: 12/25/2021 19:06  ? ?MR ANGIO HEAD WO CONTRAST ? ?Result Date: 12/26/2021 ?CLINICAL DATA:  Acute infarct. Revascularization of superior right M2 segment. EXAM: MRA HEAD WITHOUT CONTRAST TECHNIQUE: Angiographic images of the Circle of Willis were acquired using MRA technique without intravenous contrast. COMPARISON:  CTA head and neck 12/25/2021.  Revascularization films. FINDINGS: Anterior circulation: The internal carotid arteries are within normal limits from high cervical segments through the ICA termini bilaterally. The A1 and M1 segments are normal. MCA bifurcations are within normal limits. MCA bifurcations are intact. The revascularized superior right M2 segments remain patent. ACA and MCA branch vessels are normal bilaterally. Posterior circulation: The vertebral arteries are codominant. AICA vessels are dominant. The basilar artery is within normal limits. Right posterior cerebral artery originates from basilar tip. Left posterior cerebral artery is of fetal type. A left P1 segment contributes. PCA branch vessels are within normal limits bilaterally. Anatomic variants: None Other: None. IMPRESSION: 1. Normal variant MRA Circle of Willis without evidence for significant proximal stenosis, aneurysm, or branch vessel occlusion. 2. The revascularized superior right M2 segments remain patent. Electronically Signed   By: Marin Roberts M.D.   On: 12/26/2021 16:54  ? ?MR BRAIN WO CONTRAST ? ?Result Date: 12/26/2021 ?CLINICAL DATA:  Status post revascularization of right superior M2 occlusion. EXAM: MRI HEAD WITHOUT CONTRAST TECHNIQUE: Multiplanar, multiecho pulse sequences of the brain and surrounding structures were  obtained without intravenous contrast. COMPARISON:  CTA head and neck, CT perfusion, and revascularization images 12/25/21 FINDINGS: Brain: Diffusion-weighted images demonstrate acute hemorrhage within the superior right MCA division involving the right insular cortex and frontal lobe. Diffuse cortical infarct involves the high right parietal cortex. Sparing of the more inferior parietal lobe and superior temporal lobe noted. Focal area of nonhemorrhagic infarct is present in the right occipital pole. Contralateral left acute parietal and posterior  left frontal lobe cortical infarcts are present as well. T2 hyperintensities are associated with the areas of acute/subacute infarction. Focal susceptibility involving the left parietal infarct noted. Additional areas of cortical hemorrhage are present in the high posterior right parietal lobe and in the right frontal operculum. Smaller foci of hemorrhage are present in the posterior left frontal infarct. Minimal white matter changes are present otherwise. The ventricles are of normal size. No significant extraaxial fluid collection is present. The internal auditory canals are within normal limits. The brainstem and cerebellum are within normal limits. Vascular: Flow is present in the major intracranial arteries. Skull and upper cervical spine: The craniocervical junction is normal. Upper cervical spine is within normal limits. Marrow signal is unremarkable. Sinuses/Orbits: Bilateral mastoid effusions are present. There is some fluid in the posterior nasopharynx. OG tube and endotracheal tube are in place. The paranasal sinuses and mastoid air cells are otherwise clear. The globes and orbits are within normal limits. IMPRESSION: 1. 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 2. Contralateral acute/subacute cortical infarcts involving the left parietal and posterior  left frontal lobe. 3. Focal area of acute/subacute infarct in the right occipital pole. 4. Focal cortical hemorrhage within scattered infarcts involving the left parietal and frontal lobes. Additiona

## 2021-12-30 NOTE — Progress Notes (Signed)
Patient ID: Vincent Ferrell, male   DOB: 18-May-1961, 61 y.o.   MRN: [id number] ? ? ? Progress Note ? ? Subjective  ?Day #5 ?CC; acute MCA CVA, status post thrombectomy, antral valve endocarditis, acute GI hemorrhage ? ?Status post surgery last p.m. for prostate abscess transurethral resection/unroofing of prostatic abscess ? ?EGD and colonoscopy yesterday-erosive gastropathy, hiatal hernia, multiple gastric polyps findings to explain bleeding ?Colonoscopy-no evidence for ischemic colitis, some black liquid stool throughout the colon and the 1 3 mm polyp in the rectum not removed, nonbleeding internal hemorrhoids-no findings to explain acute bleeding ? ?WBC 22.8/hemoglobin 8.4/hematocrit 26.3 ?BUN 23/creatinine 1.5 ? ?Girlfriend at bedside ?No further active bleeding since procedures yesterday ? ? Objective  ? ?Vital signs in last 24 hours: ?Temp:  [98.5 ?F (36.9 ?C)-102.4 ?F (39.1 ?C)] 98.5 ?F (36.9 ?C) (04/19 0800) ?Pulse Rate:  [103-126] 111 (04/19 0800) ?Resp:  [19-31] 20 (04/19 0800) ?BP: (93-178)/(72-101) 146/85 (04/19 0800) ?SpO2:  [91 %-100 %] 99 % (04/19 0854) ?FiO2 (%):  [30 %-40 %] 40 % (04/19 0854) ?Last BM Date : 12/28/21 ?General: Well-developed older white male, intubated, somnolent ?Heart:  Regular rate and rhythm; no murmurs ?Lungs: Respirations coarse bilaterally ?Abdomen:  Soft, nontender and nondistended. Normal bowel sounds. ?Extremities:  Without edema. ?Neurologic: Intubated, sedated ? ? ?Intake/Output from previous day: ?04/18 0701 - 04/19 0700 ?In: 1350.9 [I.V.:850.8; IV Piggyback:500.1] ?Out: 3055 [Urine:2750; Stool:300; Blood:5] ?Intake/Output this shift: ?Total I/O ?In: 50.5 [I.V.:50.5] ?Out: -  ? ?Lab Results: ?Recent Labs  ?  12/28/21 ?1023 12/28/21 ?1553 12/29/21 ?0617 12/29/21 ?3875 12/30/21 ?0301 12/30/21 ?6433 12/30/21 ?2951  ?WBC 10.5  --  10.4  --  22.8*  --   --   ?HGB 6.8*   < > 8.4*   < > 8.4* 8.5* 8.7*  ?HCT 22.4*   < > 26.3*   < > 26.3* 25.0* 26.9*  ?PLT 334  --  352  --  379   --   --   ? < > = values in this interval not displayed.  ? ?BMET ?Recent Labs  ?  12/28/21 ?0025 12/28/21 ?1626 12/29/21 ?0617 12/30/21 ?0301 12/30/21 ?0338  ?NA 149*   < > 154* 148* 150*  ?K 4.2   < > 3.6 4.0 4.1  ?CL 124*  --  125* 118*  --   ?CO2 20*  --  21* 22  --   ?GLUCOSE 143*  --  131* 130*  --   ?BUN 46*  --  36* 23*  --   ?CREATININE 1.87*  --  1.81* 1.52*  --   ?CALCIUM 7.4*  --  7.9* 7.5*  --   ? < > = values in this interval not displayed.  ? ?LFT ?No results for input(s): PROT, ALBUMIN, AST, ALT, ALKPHOS, BILITOT, BILIDIR, IBILI in the last 72 hours. ?PT/INR ?No results for input(s): LABPROT, INR in the last 72 hours. ? ?Studies/Results: ?MR PELVIS W WO CONTRAST ? ?Result Date: 12/29/2021 ?CLINICAL DATA:  Prostatitis concern for abscess. EXAM: MRI PELVIS WITHOUT AND WITH CONTRAST TECHNIQUE: Multiplanar multisequence MR imaging of the pelvis was performed both before and after administration of intravenous contrast. CONTRAST:  66mL GADAVIST GADOBUTROL 1 MMOL/ML IV SOLN COMPARISON:  CT December 26, 2021 FINDINGS: Urinary Tract: Wall thickening of an incompletely distended urinary bladder. Bowel:  No evidence of bowel obstruction. Vascular/Lymphatic: No pathologically enlarged lymph nodes. No significant vascular abnormality seen. Reproductive: MRI findings consistent with prostatitis with a rim enhancing thick walled fluid collection measuring  3.7 x 2.8 cm along the left side of the prostate gland on image 28/5 most consistent with an intra prostatic abscess. Other: Trace pelvic free fluid is likely reactive. Small volume nonspecific subcutaneous edema. Musculoskeletal: Symmetric edema in the bilateral hip adductors, no intramuscular fluid collection or abnormal enhancement. No suspicious osseous lesion. IMPRESSION: 1. MRI findings consistent with prostatitis with a 3.7 x 2.8 cm rim enhancing fluid collection along the left side of the prostate gland most consistent with an intraprostatic abscess. 2. Wall  thickening of the incompletely distended urinary bladder. Correlate for bladder outlet obstruction. 3. Symmetric edema in the bilateral hepatic dome may reflect nonspecific myositis. No evidence of pyomyositis. Electronically Signed   By: Maudry Mayhew M.D.   On: 12/29/2021 19:10  ? ?CT MAXILLOFACIAL W CONTRAST ? ?Result Date: 12/29/2021 ?CLINICAL DATA:  Endocarditis.  Search for source. EXAM: CT MAXILLOFACIAL WITH CONTRAST TECHNIQUE: Multidetector CT imaging of the maxillofacial structures was performed with intravenous contrast. Multiplanar CT image reconstructions were also generated. RADIATION DOSE REDUCTION: This exam was performed according to the departmental dose-optimization program which includes automated exposure control, adjustment of the mA and/or kV according to patient size and/or use of iterative reconstruction technique. CONTRAST:  OMNIPAQUE IOHEXOL 300 MG/ML  SOLN COMPARISON:  None. FINDINGS: Osseous: No fracture or mandibular dislocation. No destructive process. No focal dental abnormality. Orbits: Negative. No traumatic or inflammatory finding. Sinuses: Clear. Soft tissues: Negative. Limited intracranial: No significant or unexpected finding. IMPRESSION: Normal maxillofacial CT.  No focal dental abnormality. Electronically Signed   By: Deatra Robinson M.D.   On: 12/29/2021 19:31  ? ?ECHO TEE ? ?Result Date: 12/28/2021 ?   TRANSESOPHOGEAL ECHO REPORT   Patient Name:   Vincent Ferrell Date of Exam: 12/28/2021 Medical Rec #:  [id number]         Height:       71.5 in Accession #:    [id number]        Weight:       221.8 lb Date of Birth:  [birth year]         BSA:          2.215 m? Patient Age:    60 years          BP:           103/69 mmHg Patient Gender: M                 HR:           77 bpm. Exam Location:  Inpatient Procedure: Transesophageal Echo, 3D Echo, Color Doppler and Cardiac Doppler Indications:     Endocarditis, Stroke i63.9  History:         Patient has prior history of Echocardiogram  examinations, most                  recent 12/26/2021. Risk Factors:Dyslipidemia.  Sonographer:     Irving Burton Senior RDCS Referring Phys:  [id number] [facility] A Izora Ribas Diagnosing Phys: Riley Lam MD PROCEDURE: After discussion of the risks and benefits of a TEE, an informed consent was obtained from a family member. The transesophogeal probe was passed without difficulty through the esophogus of the patient. Sedation performed by performing physician. Patients was under conscious sedation during this procedure. Anesthetic administered: of Fentanyl, 3mg  of Versed. The patient developed no complications during the procedure. IMPRESSIONS  1. The mitral valve is abnormal. Mitral valve vegetation involving A2-P2 and A3-P3. Largest 1 cm X 0.5 cm. No perforation. No  commisurate involvement. Invovlvement of the atrial and ventricular surfaces. No involvement of the intravalvular fibrosia. Trivial mitral valve regurgitation. No evidence of mitral stenosis.  2. Left ventricular ejection fraction, by estimation, is 55 to 60%. The left ventricle has normal function.  3. Right ventricular systolic function is normal. The right ventricular size is normal.  4. No left atrial/left atrial appendage thrombus was detected. The LAA emptying velocity was 37 cm/s.  5. A small pericardial effusion is present. The pericardial effusion is localized near the right atrium.  6. The aortic valve is tricuspid. Aortic valve regurgitation is not visualized. No aortic stenosis is present. FINDINGS  Left Ventricle: Left ventricular ejection fraction, by estimation, is 55 to 60%. The left ventricle has normal function. The left ventricular internal cavity size was normal in size. Right Ventricle: The right ventricular size is normal. No increase in right ventricular wall thickness. Right ventricular systolic function is normal. Left Atrium: Left atrial size was normal in size. No left atrial/left atrial appendage thrombus was detected.  The LAA emptying velocity was 37 cm/s. Right Atrium: Right atrial size was normal in size. Pericardium: A small pericardial effusion is present. The pericardial effusion is localized near the right a

## 2021-12-30 NOTE — Progress Notes (Signed)
? ?NAME:  Vincent Ferrell, MRN:  [id number], DOB:  Dec 29, 1960, LOS: 5 ?ADMISSION DATE:  12/25/2021, CONSULTATION DATE:  12/25/2021 ?REFERRING MD:  Dr. Corliss Skains, CHIEF COMPLAINT:  left sided weakness  ? ?History of Present Illness:  ?HPI mostly obtained from medical chart review.  Attempted from patient but limited given aphasia.  ? ?61 year old male with prior history of HTN, HLD, and GERD who presented to ER as code stroke.  Patient was at the vet's office when he acutely developed left sided weakness and right gaze deviation at 0945.  Code stroke activated by EMS in addition to code STEMI based on diffuse STE.  Of note, patient recently diagnosed with RLL bronchopneumonia two days ago by his PCP.   ? ?He was evaluated by Neurology and Cardiology in ER, HR in 150s with no chest pain or dyspnea, with EKG not meeting criteria for STEMI but showed mild STE, felt possibly rate related versus possible pericarditis.  Echo and cardiac workup ordered.  CT head showed concern for subacute infarcts in the R PCA territory and two small areas possibly representing calcification or blood, therefore thrombolytics deferred.  CTA head/ neck and perfusion study showed acute right MCA anterior M2 occlusion.   ?Labs significant for WBC 20.6, Hgb 9.2, Hct 26.8, plts 205, normal coags, K 4.9, bicarb 21, glucose 138, BUN 34, sCr 1.83, iCa 1.03, low albumin/ protein, lactic acid 1.5. Trop hs pending.  CXR showing elevation of right hemidiaphragm with pulmonary vascular congestion.  Blood cultures sent.   Temp 100 in ER, remains ST, and initial BP 140/90, and remains on room air > 94%.  He was taken emergently to Neuro IR for mechanical thrombectomy with complete revascularization achieving TICI 3 revascularization.  Patient was extubated post intervention.  Briefly required phenylephrine in PACU.  Received 2L LR and albumin in procedure.  PCCM consulted for further medical assistance given concern for sepsis and possible cardioembolic  event related to stroke. ? ?Patient is currently only able to nod yes/ no to question given expressive aphasia and mostly incomprehensible speech.  Nodded yes to recent fever, cough with productive sputum, and headache.  Nods yes to recent bloody stools.  Did indicate that he started antibiotics from PCP.  Denied current or previous SOB, CP, palpitations, N/V/abd pain, lower leg swelling.   Patient indicates he lives alone, not married, no children, and has living parents out of state.  Non smoker, occasional but not daily ETOH, denies drug abuse.  ? ?Pertinent  Medical History  ?HLT, HLD, GERD, rising PSA level, impaired fasting glucose ? 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  Cards consulted for initial code STEMI with diffuse STE, no STEMI. Required 4 units of pRBCs for lower GI bleed.  ?4/17: Large bloody BM. Bedside colonoscopy with blood throughout colon. 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 Hemoglobin decreased to 6.8. Additional 2 units of pRBCs and FFP given.  ?4/18: Bedside EGD and colonoscopy with evidence of black blood but no source of bleeding identified. MRI of the prostate demonstrated large abscess. Patient taken for drainage by Urology. Cultures obtained.  ? ?Interim History / Subjective:  ? ?MRI prostate obtained yesterday demonstrating large abscess. Patient taken for drainage, no complications noted. Gram stain demonstrates GPC in clusters.  ?Febrile overnight up to 101.4.  ?Sinus tachycardia all night.  ? ?Patient sedated this AM.  Girlfriend at bedside states he has not mentioned any dysuria or straining with urination, but notes they do not discuss such things.  ? ?Objective   ?Blood pressure (!) 146/85, pulse (!) 111, temperature 98.5 ?F (36.9 ?C), temperature source Oral, resp. rate 20, height 5' 11.5"  (1.816 m), weight 100.6 kg, SpO2 98 %. ?   ?Vent Mode: PRVC ?FiO2 (%):  [30 %-40 %] 30 % ?Set Rate:  [16 bmp-30 bmp] 16 bmp ?Vt Set:  [600 mL] 600 mL ?PEEP:  [5 cmH20] 5 cmH20 ?Plateau Pressure:  [11 cmH20-30 cmH20] 23 cmH20  ? ?Intake/Output Summary (Last 24 hours) at 12/30/2021 0805 ?Last data filed at 12/30/2021 0600 ?Gross per 24 hour  ?Intake 1283.6 ml  ?Output 3055 ml  ?Net -1771.4 ml  ? ? ?Filed Weights  ? 12/25/21 1000 12/27/21 0500  ?Weight: 100.8 kg 100.6 kg  ? ?Examination:  ?General: Acutely ill-appearing middle-age male, lying on the bed, orally intubated ?HEENT: Moist mucus membranes. ETT/OGT in place ?Neuro: Sedation limiting examination.  ?CV: Regular rhythm with tachycardia. No rub or murmurs.  ?PULM: Coarse breath sounds. No rales or rhonchi.  ?GI: Soft, non-distended, normoactive bowel sounds present. 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 Stable ? ?Resolved Hospital Problem list   ?Septic Shock, resolved  ?Ileus/partial small bowel obstruction ? ?Assessment & Plan:  ? ?Mitral Valve Endocarditis  ?- TEE on 4/17 with 1cm x 0.5 cm vegetation involving both atrial and ventricular sides.  ?No significant stenosis or regurgitation  ?- Blood cultures negative on admission, however on Doxycycline PTA ?- Source unidentified thus far. CT maxillofacial negative for dental involvement. Prostate abscess identified yesterday on MRI, with resection last night. Gram stain with GPC in clusters. Culture still pending.  ?- Infectious Disease following; appreciate their recommendations  ?- Continue Vancomycin and Ceftriaxone  ? ?Acute R MCA, with M2 occlusion s/p mechanical thrombectomy with complete revascularization ?Bilateral Embolic Infarcts  ?- Given MV endocarditis and no prior history of atherosclerotic disease, suspect this may be been secondary to septic emboli ?- Stroke team is following ?-  Continue secondary stroke prophylaxis ?- SBP goal 120-140 ?- Continue high intensity statin, Lipitor  ? ?Acute hypoxic respiratory failure ?Multi-focal pneumonia ?- Diagnosed with RLL pneumonia PTA on Doxycycline. CTA on admission with results concerning for multi-focal pneumonia involving bilateral upper and lower lobes ?- Continue lung protective ventilation ?- VAP bundle ?- PAD protocol with RASS goal 0/-1, currently on Precedex and fentanyl PRN ?- IV Ceftriaxone for coverage of aspiration pneumonia ?- Tracheal aspirate cultures pending ?- Will consider extubation either today or tomorrow pending SBT today ? ?Prostate Gland Abscess ?- Noted on recent CTA, area of lobulation within the inferolateral portion of the prostate. PSA elevated at 25.7. Previously mildly elevated at 1.6. ?- Urology following; appreciate their recommendations  ?- MRI prostate consistent with abscess s/p transurethral resection on 4/18  ?- Will need to continue foley catheter for at least 2 weeks  ? ?Acute blood loss anemia s/p multiple pRBC transfusions ?Lower GI bleeding ?- Patient endorsed blood stools PTA. Significant lower GI bleed throughout hospitalization.  ?- GI following; appreciate their recommendations ?- Bedside EGD on 4/18, and colonoscopy on 4/17, 4/18 with blood present however source not identified.  ?- Hemoglobin stable at this time.  ? ?AKI, due to septic ATN versus contrast-induced ?- Renal function improving Good UOP over last 24 hours at 2.7L ?- Monitor intake and  output ?- Avoid nephrotoxic agents ?- Daily BMP  ? ?Diffuse mild STE ?Demand cardiac ischemia ?- Suspected to be related to sepsis vs stress demand ? ?Prediabetes with hyperglycemia ?- Patient's hemoglobin A1c is 6 ?- Continue sliding scale insulin with CBG goal 140-180 ? ?Best Practice (right click and "Reselect all SmartList Selections" daily)  ? ?Diet/type: NPO ?DVT prophylaxis: SCDs ?GI prophylaxis: PPI ?Lines: Left [place] central line. Still needed.  ?Foley:   N/A ?Code Status:  full code ?Last date of multidisciplinary goals of care discussion [4/17, patient's girlfriend was updated at bedside. Patient's sister updated via telephone] ?  ?Labs   ?CBC: ?Recent Labs  ?

## 2021-12-30 NOTE — Progress Notes (Signed)
?  Transition of Care (TOC) Screening Note ? ? ?Patient Details  ?Name: Vincent Ferrell ?Date of Birth: 1961-07-21 ? ? ?Transition of Care (TOC) CM/SW Contact:    ?Mearl Latin, LCSW ?Phone Number: ?12/30/2021, 6:13 PM ? ? ? ?Transition of Care Department [facility]) has reviewed patient. We will continue to monitor patient advancement through interdisciplinary progression rounds. If new patient transition needs arise, please place a TOC consult. ? ? ?

## 2021-12-30 NOTE — Evaluation (Signed)
Physical Therapy Evaluation ?Patient Details ?Name: Vincent Ferrell ?MRN: [id number] ?DOB: 04-27-1961 ?Today's Date: 12/30/2021 ? ?History of Present Illness ? Pt is 61 yo male who presents with L side weakness, L facial droop, and R gaze deviation. He had been dx with bronchopneumonia on 4/11. 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 Pt required intubation on 4/14. Pt with lower GIB, colonoscopy 4/17 and upper GI endoscopy 4/18. Pt underwent Transurethral resection of the prostatic abscess on 4/18. PMH: HLD, HTN, elevated glucose.  ?Clinical Impression ? Pt presents to PT with deficits in functional mobility, strength, balance, power, endurance. Pt demonstrates L hemiplegia at this time, requiring significant assistance to perform bed mobility and maintain sitting balance. Communication is limited as the pt remains intubated, however he seems to answer yes/no questions appropriately. Pt will benefit from continued attempts at mobilization in an effort to improve strength and balance. PT recommends AIR admission pending improvement in activity tolerance once extubated.   ?   ? ?Recommendations for follow up therapy are one component of a multi-disciplinary discharge planning process, led by the attending physician.  Recommendations may be updated based on patient status, additional functional criteria and insurance authorization. ? ?Follow Up Recommendations Acute inpatient rehab (3hours/day) ? ?  ?Assistance Recommended at Discharge Frequent or constant Supervision/Assistance  ?Patient can return home with the following ? Two people to help with walking and/or transfers;Two people to help with bathing/dressing/bathroom;Assistance with cooking/housework;Assistance with feeding;Direct supervision/assist for medications management;Direct supervision/assist for financial management;Assist for transportation;Help with stairs or ramp for entrance ? ?  ?Equipment Recommendations Wheelchair (measurements  PT);Wheelchair cushion (measurements PT);Hospital bed (hoyer lift)  ?Recommendations for Other Services ? Rehab consult  ?  ?Functional Status Assessment Patient has had a recent decline in their functional status and demonstrates the ability to make significant improvements in function in a reasonable and predictable amount of time.  ? ?  ?Precautions / Restrictions Precautions ?Precautions: Fall ?Precaution Comments: L hemiplegia ?Restrictions ?Weight Bearing Restrictions: No  ? ?  ? ?Mobility ? Bed Mobility ?Overal bed mobility: Needs Assistance ?Bed Mobility: Supine to Sit, Sit to Supine ?  ?  ?Supine to sit: Max assist, HOB elevated ?Sit to supine: Total assist ?  ?  ?  ? ?Transfers ?  ?  ?  ?  ?  ?  ?  ?  ?  ?  ?  ? ?Ambulation/Gait ?  ?  ?  ?  ?  ?  ?  ?  ? ?Stairs ?  ?  ?  ?  ?  ? ?Wheelchair Mobility ?  ? ?Modified Rankin (Stroke Patients Only) ?  ? ?  ? ?Balance Overall balance assessment: Needs assistance ?Sitting-balance support: No upper extremity supported, Feet supported, Single extremity supported ?Sitting balance-Leahy Scale: Poor ?Sitting balance - Comments: maxA, pt is able to activate trunk flexors in an effort to lean forward ?Postural control: Posterior lean ?  ?  ?  ?  ?  ?  ?  ?  ?  ?  ?  ?  ?  ?  ?   ? ? ? ?Pertinent Vitals/Pain Pain Assessment ?Pain Assessment: CPOT ?Facial Expression: Relaxed, neutral ?Body Movements: Absence of movements ?Muscle Tension: Relaxed ?Compliance with ventilator (intubated pts.): Tolerating ventilator or movement ?Vocalization (extubated pts.): N/A ?CPOT Total: 0  ? ? ?Home Living Family/patient expects to be discharged to:: Private residence ?Living Arrangements: Alone ?Available Help at Discharge: Other (Comment);Available PRN/intermittently (significant other PRN?) ?Type of Home: House ?  Home Access: Stairs to enter ?  ?Entrance Stairs-Number of Steps: unsure ?  ?Home Layout: Two level ?  ?Additional Comments: pt intubated, answering yes/no questions with  thumbs up/down  ?  ?Prior Function Prior Level of Function : Independent/Modified Independent;Driving ?  ?  ?  ?  ?  ?  ?  ?  ?  ? ? ?Hand Dominance  ?   ? ?  ?Extremity/Trunk Assessment  ? Upper Extremity Assessment ?Upper Extremity Assessment: RUE deficits/detail;LUE deficits/detail ?RUE Deficits / Details: grossly 4-/5 ?LUE Deficits / Details: flaccid, PROM WFL ?  ? ?Lower Extremity Assessment ?Lower Extremity Assessment: RLE deficits/detail;LLE deficits/detail ?RLE Deficits / Details: grossly 4-/5 ?LLE Deficits / Details: flaccid, PROM WFL ?  ? ?Cervical / Trunk Assessment ?Cervical / Trunk Assessment: Normal  ?Communication  ? Communication: Other (comment) (intubated)  ?Cognition Arousal/Alertness: Awake/alert ?Behavior During Therapy: Flat affect ?Overall Cognitive Status: Difficult to assess ?  ?  ?  ?  ?  ?  ?  ?  ?  ?  ?  ?  ?  ?  ?  ?  ?General Comments: pt follows commands with R side with increased time, slowed processing. Answers yes/no questions with thumbs up/down ?  ?  ? ?  ?General Comments General comments (skin integrity, edema, etc.): pt on pressure support, 40% FiO2 5 PEEP. Mild tachycardia into 120s ? ?  ?Exercises    ? ?Assessment/Plan  ?  ?PT Assessment Patient needs continued PT services  ?PT Problem List Decreased strength;Decreased activity tolerance;Decreased balance;Decreased mobility;Cardiopulmonary status limiting activity ? ?   ?  ?PT Treatment Interventions DME instruction;Gait training;Stair training;Functional mobility training;Therapeutic activities;Therapeutic exercise;Balance training;Neuromuscular re-education;Cognitive remediation;Patient/family education   ? ?PT Goals (Current goals can be found in the Care Plan section)  ?Acute Rehab PT Goals ?Patient Stated Goal: pt unable to state, PT goal to return to ambulation ?PT Goal Formulation: Patient unable to participate in goal setting ?Time For Goal Achievement: 01/13/22 ?Potential to Achieve Goals: Fair ? ?  ?Frequency Min  4X/week ?  ? ? ?Co-evaluation   ?  ?  ?  ?  ? ? ?  ?AM-PAC PT "6 Clicks" Mobility  ?Outcome Measure Help needed turning from your back to your side while in a flat bed without using bedrails?: A Lot ?Help needed moving from lying on your back to sitting on the side of a flat bed without using bedrails?: A Lot ?Help needed moving to and from a bed to a chair (including a wheelchair)?: Total ?Help needed standing up from a chair using your arms (e.g., wheelchair or bedside chair)?: Total ?Help needed to walk in hospital room?: Total ?Help needed climbing 3-5 steps with a railing? : Total ?6 Click Score: 8 ? ?  ?End of Session Equipment Utilized During Treatment: Oxygen ?Activity Tolerance: Patient tolerated treatment well ?Patient left: in bed;with call bell/phone within reach ?Nurse Communication: Mobility status;Need for lift equipment ?PT Visit Diagnosis: Other abnormalities of gait and mobility (R26.89);Muscle weakness (generalized) (M62.81);Other symptoms and signs involving the nervous system (R29.898);Hemiplegia and hemiparesis ?Hemiplegia - Right/Left: Left ?Hemiplegia - caused by: Cerebral infarction ?  ? ?Time: [id number] ?PT Time Calculation (min) (ACUTE ONLY): 18 min ? ? ?Charges:   PT Evaluation ?$PT Eval Moderate Complexity: 1 Mod ?  ?  ?   ? ? ?Zenaida Niece, PT, DPT ?Acute Rehabilitation ?Pager: [phone] ?Office [phone] ? ? ?Zenaida Niece ?12/30/2021, 3:49 PM ? ?

## 2021-12-30 NOTE — Plan of Care (Signed)
?  Problem: Education: ?Goal: Knowledge of disease or condition will improve ?Outcome: Progressing ?  ?Problem: Health Behavior/Discharge Planning: ?Goal: Ability to manage health-related needs will improve ?Outcome: Progressing ?  ?Problem: Self-Care: ?Goal: Ability to participate in self-care as condition permits will improve ?Outcome: Progressing ?  ?Problem: Nutrition: ?Goal: Risk of aspiration will decrease ?Outcome: Progressing ?  ?Problem: Skin Integrity: ?Goal: Risk for impaired skin integrity will decrease ?Outcome: Progressing ?  ?

## 2021-12-30 NOTE — Progress Notes (Signed)
Inpatient Rehab Admissions Coordinator:  ? ?Per therapy recommendations pt was screened for CIR by Estill Dooms, PT, DPT.  At this time, pt remains on the vent.  Will rescreen once extubated.   ? ?Estill Dooms, PT, DPT ?Admissions Coordinator ?[phone] ?12/30/21  ?4:20 PM ? ?

## 2021-12-30 NOTE — Progress Notes (Signed)
?   ? ?[facility] ? ?Date of Admission:  12/25/2021    ?       ?Reason for visit: Follow up on Endocarditis ? ?Current antibiotics: ?Vancomycin and ceftriaxone ? ?ASSESSMENT:   ? ?61 y.o. male admitted with: ? 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  Blood cultures x4 are no growth to date after being obtained following 3 to 4 days of doxycycline prescribed as an outpatient. ?Prostate abscess: Initial prostate abnormalities noted on CT from admission with MRI yesterday confirming a large abscess.  Taken to the OR 4/18 with urology for unroofing of the abscess and cultures obtained.  Gram stain shows GPC's in clusters. ?Bilateral CNS embolic infarcts: Suspected secondary to mitral valve endocarditis. ?Multifocal pneumonia with hypoxic respiratory failure: Requiring intubation. ?Acute kidney injury: Creatinine today is improved. ? ?RECOMMENDATIONS:   ? ?Continue vancomycin and ceftriaxone ?Follow-up prostate abscess cultures ?Follow-up pending blood cultures ?Will follow ? ? ?Principal Problem: ?  Acute ischemic right MCA stroke ([place]) ?Active Problems: ?  Middle cerebral artery embolism, right ?  Leukocytosis ?  Sinus tachycardia ?  Acute respiratory failure with hypoxia ([place]) ? ? ? ?MEDICATIONS:   ? ?Scheduled Meds: ?? atorvastatin  40 mg Per Tube Daily  ?? chlorhexidine gluconate (MEDLINE KIT)  15 mL Mouth Rinse BID  ?? Chlorhexidine Gluconate Cloth  6 each Topical Daily  ?? cloNIDine  0.1 mg Per Tube TID  ?? docusate  100 mg Per Tube BID  ?? insulin aspart  0-15 Units Subcutaneous Q4H  ?? mouth rinse  15 mL Mouth Rinse 10 times per day  ?? pantoprazole (PROTONIX) IV  40 mg Intravenous Q12H  ?? sodium chloride flush  3 mL Intravenous Once  ? ?Continuous Infusions: ?? sodium chloride    ?? sodium chloride 10 mL/hr at 12/30/21 0800  ??  cefTRIAXone (ROCEPHIN)  IV 0 g (12/29/21 1046)  ?? propofol (DIPRIVAN) infusion 40 mcg/kg/min (12/30/21 0800)  ?? vancomycin Stopped (12/30/21 0035)  ? ?PRN Meds:.Place/Maintain arterial line **AND** sodium chloride, sodium chloride, acetaminophen **OR** acetaminophen (TYLENOL) oral liquid 160 mg/5 mL **OR** acetaminophen, fentaNYL (SUBLIMAZE) injection, fentaNYL (SUBLIMAZE) injection, senna-docusate ? ?SUBJECTIVE:  ? ?24 hour events:  ?Patient was taken to the OR overnight after MRI confirmed the presence of a prostate abscess ?EGD and colonoscopy also performed yesterday without significant findings ?CT maxillofacial also negative ?BBC at this morning elevated to 22.8 renal function improved ?Cystoscopy cultures with GPC's in clusters on Gram stain ?Interestingly urinalysis on admission was unremarkable ?Remains febrile, Tmax 102.4 ?Currently on propofol for sedation.  Not requiring blood pressure support ? ? ?Intubated and sedated.  ? ?Review of Systems  ?Unable to perform ROS: Intubated  ? ?  ?OBJECTIVE:  ? ?Blood pressure (!) 146/85, pulse (!) 111, temperature 98.5 ?F (36.9 ?C), temperature source Oral, resp. rate 20, height 5' 11.5" (1.816 m), weight 100.6 kg, SpO2 98 %. ?Body mass index is 30.5 kg/m?. ? ?Physical Exam ?Constitutional:   ?   Comments: Patient remains intubated and sedated in the ICU.    ?HENT:  ?   Head: Normocephalic and atraumatic.  ?   Mouth/Throat:  ?   Comments: Endotracheal tube in place ?Eyes:  ?   Extraocular Movements: Extraocular movements intact.  ?   Conjunctiva/sclera: Conjunctivae normal.  ?Cardiovascular:  ?   Rate and Rhythm: Normal rate and regular rhythm.  ?  Pulmonary:  ?   Effort: No respiratory distress.  ?   Comments: Ventilated breath sounds.  Diminished on the right. ?Abdominal:  ?   General: There is no distension.  ?   Palpations: Abdomen is soft.  ?   Tenderness: There is no abdominal tenderness.  ?Musculoskeletal:  ?   Cervical back: Normal range of motion and neck  supple.  ?   Right lower leg: No edema.  ?   Left lower leg: No edema.  ?Skin: ?   General: Skin is warm and dry.  ?Neurological:  ?   Comments: Sedated.  ? ? ? ?Lab Results: ?Lab Results  ?Component Value Date  ? WBC 22.8 (H) 12/30/2021  ? HGB 8.5 (L) 12/30/2021  ? HCT 25.0 (L) 12/30/2021  ? MCV 96.3 12/30/2021  ? PLT 379 12/30/2021  ?  ?Lab Results  ?Component Value Date  ? NA 150 (H) 12/30/2021  ? K 4.1 12/30/2021  ? CO2 22 12/30/2021  ? GLUCOSE 130 (H) 12/30/2021  ? BUN 23 (H) 12/30/2021  ? CREATININE 1.52 (H) 12/30/2021  ? CALCIUM 7.5 (L) 12/30/2021  ? GFRNONAA 52 (L) 12/30/2021  ?  ?Lab Results  ?Component Value Date  ? ALT 24 12/27/2021  ? AST 33 12/27/2021  ? ALKPHOS 47 12/27/2021  ? BILITOT 0.6 12/27/2021  ? ? ?No results found for: CRP ? ?   ?Component Value Date/Time  ? ESRSEDRATE 50 (H) 12/25/2021 1730  ? ?  ?I have reviewed the micro and lab results in Epic. ? ?Imaging: ?MR PELVIS W WO CONTRAST ? ?Result Date: 12/29/2021 ?CLINICAL DATA:  Prostatitis concern for abscess. EXAM: MRI PELVIS WITHOUT AND WITH CONTRAST TECHNIQUE: Multiplanar multisequence MR imaging of the pelvis was performed both before and after administration of intravenous contrast. CONTRAST:  41m GADAVIST GADOBUTROL 1 MMOL/ML IV SOLN COMPARISON:  CT December 26, 2021 FINDINGS: Urinary Tract: Wall thickening of an incompletely distended urinary bladder. Bowel:  No evidence of bowel obstruction. Vascular/Lymphatic: No pathologically enlarged lymph nodes. No significant vascular abnormality seen. Reproductive: MRI findings consistent with prostatitis with a rim enhancing thick walled fluid collection measuring 3.7 x 2.8 cm along the left side of the prostate gland on image 28/5 most consistent with an intra prostatic abscess. Other: Trace pelvic free fluid is likely reactive. Small volume nonspecific subcutaneous edema. Musculoskeletal: Symmetric edema in the bilateral hip adductors, no intramuscular fluid collection or abnormal enhancement.  No suspicious osseous lesion. IMPRESSION: 1. MRI findings consistent with prostatitis with a 3.7 x 2.8 cm rim enhancing fluid collection along the left side of the prostate gland most consistent with an intraprostatic abscess. 2. Wall thickening of the incompletely distended urinary bladder. Correlate for bladder outlet obstruction. 3. Symmetric edema in the bilateral hepatic dome may reflect nonspecific myositis. No evidence of pyomyositis. Electronically Signed   By: JDahlia BailiffM.D.   On: 12/29/2021 19:10  ? ?CT MAXILLOFACIAL W CONTRAST ? ?Result Date: 12/29/2021 ?CLINICAL DATA:  Endocarditis.  Search for source. EXAM: CT MAXILLOFACIAL WITH CONTRAST TECHNIQUE: Multidetector CT imaging of the maxillofacial structures was performed with intravenous contrast. Multiplanar CT image reconstructions were also generated. RADIATION DOSE REDUCTION: This exam was performed according to the departmental dose-optimization program which includes automated exposure control, adjustment of the mA and/or kV according to patient size and/or use of iterative reconstruction technique. CONTRAST:  1074mOMNIPAQUE IOHEXOL 300 MG/ML  SOLN COMPARISON:  None. FINDINGS: Osseous: No fracture or mandibular dislocation. No destructive process. No focal dental abnormality. Orbits: Negative.  No traumatic or inflammatory finding. Sinuses: Clear. Soft tissues: Negative. Limited intracranial: No significant or unexpected finding. IMPRESSION: Normal maxillofacial CT.  No focal dental abnormality. Electronically Signed   By: Ulyses Jarred M.D.   On: 12/29/2021 19:31  ? ?ECHO TEE ? ?Result Date: 12/28/2021 ?   TRANSESOPHOGEAL ECHO REPORT   Patient Name:   Vincent Ferrell Date of Exam: 12/28/2021 Medical Rec #:  [id number]         Height:       71.5 in Accession #:    [id number]        Weight:       221.8 lb Date of Birth:  03/26/1961         BSA:          2.215 m? Patient Age:    89 years          BP:           103/69 mmHg Patient Gender: M                  HR:           77 bpm. Exam Location:  Inpatient Procedure: Transesophageal Echo, 3D Echo, Color Doppler and Cardiac Doppler Indications:     Endocarditis, Stroke i63.9  History:         Patient has prior h

## 2021-12-30 NOTE — Progress Notes (Signed)
eLink Physician-Brief Progress Note ?Patient Name: Glennis Rauseo ?DOB: Aug 19, 1961 ?MRN: [id number] ? ? ?Date of Service ? 12/30/2021  ?HPI/Events of Note ? BP on the soft side (93/62), patient is oliguric, Na + 148.  ?eICU Interventions ? LR 500 ml iv bolus x 1 ordered.  ? ? ? ?  ? ?Frederik Pear ?12/30/2021, 10:12 PM ?

## 2021-12-31 ENCOUNTER — Inpatient Hospital Stay (HOSPITAL_COMMUNITY): Payer: Managed Care, Other (non HMO)

## 2021-12-31 DIAGNOSIS — N179 Acute kidney failure, unspecified: Secondary | ICD-10-CM | POA: Diagnosis not present

## 2021-12-31 DIAGNOSIS — I63511 Cerebral infarction due to unspecified occlusion or stenosis of right middle cerebral artery: Secondary | ICD-10-CM | POA: Diagnosis not present

## 2021-12-31 DIAGNOSIS — I340 Nonrheumatic mitral (valve) insufficiency: Secondary | ICD-10-CM | POA: Diagnosis not present

## 2021-12-31 DIAGNOSIS — I38 Endocarditis, valve unspecified: Secondary | ICD-10-CM

## 2021-12-31 DIAGNOSIS — I339 Acute and subacute endocarditis, unspecified: Secondary | ICD-10-CM | POA: Diagnosis not present

## 2021-12-31 DIAGNOSIS — J9601 Acute respiratory failure with hypoxia: Secondary | ICD-10-CM | POA: Diagnosis not present

## 2021-12-31 DIAGNOSIS — D500 Iron deficiency anemia secondary to blood loss (chronic): Secondary | ICD-10-CM | POA: Diagnosis not present

## 2021-12-31 LAB — POCT I-STAT 7, (LYTES, BLD GAS, ICA,H+H)
Acid-base deficit: 4 mmol/L — ABNORMAL HIGH (ref 0.0–2.0)
Acid-base deficit: 3 mmol/L — ABNORMAL HIGH (ref 0.0–2.0)
Acid-base deficit: 1 mmol/L (ref 0.0–2.0)
Bicarbonate: 23.9 mmol/L (ref 20.0–28.0)
Bicarbonate: 22.5 mmol/L (ref 20.0–28.0)
Bicarbonate: 24.4 mmol/L (ref 20.0–28.0)
Calcium, Ion: 1.18 mmol/L (ref 1.15–1.40)
Calcium, Ion: 1.19 mmol/L (ref 1.15–1.40)
Calcium, Ion: 1.18 mmol/L (ref 1.15–1.40)
HCT: 28 % — ABNORMAL LOW (ref 39.0–52.0)
HCT: 24 % — ABNORMAL LOW (ref 39.0–52.0)
HCT: 26 % — ABNORMAL LOW (ref 39.0–52.0)
Hemoglobin: 9.5 g/dL — ABNORMAL LOW (ref 13.0–17.0)
Hemoglobin: 8.2 g/dL — ABNORMAL LOW (ref 13.0–17.0)
Hemoglobin: 8.8 g/dL — ABNORMAL LOW (ref 13.0–17.0)
O2 Saturation: 97 %
O2 Saturation: 91 %
O2 Saturation: 96 %
Patient temperature: 98
Patient temperature: 97.9
Patient temperature: 98.4
Potassium: 4.1 mmol/L (ref 3.5–5.1)
Potassium: 4.3 mmol/L (ref 3.5–5.1)
Potassium: 3.9 mmol/L (ref 3.5–5.1)
Sodium: 148 mmol/L — ABNORMAL HIGH (ref 135–145)
Sodium: 146 mmol/L — ABNORMAL HIGH (ref 135–145)
Sodium: 146 mmol/L — ABNORMAL HIGH (ref 135–145)
TCO2: 26 mmol/L (ref 22–32)
TCO2: 24 mmol/L (ref 22–32)
TCO2: 26 mmol/L (ref 22–32)
pCO2 arterial: 55.6 mmHg — ABNORMAL HIGH (ref 32–48)
pCO2 arterial: 40.5 mmHg (ref 32–48)
pCO2 arterial: 45.2 mmHg (ref 32–48)
pH, Arterial: 7.239 — ABNORMAL LOW (ref 7.35–7.45)
pH, Arterial: 7.351 (ref 7.35–7.45)
pH, Arterial: 7.34 — ABNORMAL LOW (ref 7.35–7.45)
pO2, Arterial: 110 mmHg — ABNORMAL HIGH (ref 83–108)
pO2, Arterial: 64 mmHg — ABNORMAL LOW (ref 83–108)
pO2, Arterial: 83 mmHg (ref 83–108)

## 2021-12-31 LAB — CBC
HCT: 31.3 % — ABNORMAL LOW (ref 39.0–52.0)
Hemoglobin: 9.6 g/dL — ABNORMAL LOW (ref 13.0–17.0)
MCH: 29.8 pg (ref 26.0–34.0)
MCHC: 30.7 g/dL (ref 30.0–36.0)
MCV: 97.2 fL (ref 80.0–100.0)
Platelets: 392 10*3/uL (ref 150–400)
RBC: 3.22 MIL/uL — ABNORMAL LOW (ref 4.22–5.81)
RDW: 16.2 % — ABNORMAL HIGH (ref 11.5–15.5)
WBC: 19.4 10*3/uL — ABNORMAL HIGH (ref 4.0–10.5)
nRBC: 0.4 % — ABNORMAL HIGH (ref 0.0–0.2)

## 2021-12-31 LAB — HEMOGLOBIN AND HEMATOCRIT, BLOOD
HCT: 27.2 % — ABNORMAL LOW (ref 39.0–52.0)
HCT: 31.5 % — ABNORMAL LOW (ref 39.0–52.0)
HCT: 32.1 % — ABNORMAL LOW (ref 39.0–52.0)
Hemoglobin: 8.5 g/dL — ABNORMAL LOW (ref 13.0–17.0)
Hemoglobin: 9.3 g/dL — ABNORMAL LOW (ref 13.0–17.0)
Hemoglobin: 9.6 g/dL — ABNORMAL LOW (ref 13.0–17.0)

## 2021-12-31 LAB — GLUCOSE, CAPILLARY
Glucose-Capillary: 102 mg/dL — ABNORMAL HIGH (ref 70–99)
Glucose-Capillary: 105 mg/dL — ABNORMAL HIGH (ref 70–99)
Glucose-Capillary: 117 mg/dL — ABNORMAL HIGH (ref 70–99)
Glucose-Capillary: 121 mg/dL — ABNORMAL HIGH (ref 70–99)
Glucose-Capillary: 122 mg/dL — ABNORMAL HIGH (ref 70–99)
Glucose-Capillary: 147 mg/dL — ABNORMAL HIGH (ref 70–99)

## 2021-12-31 LAB — BASIC METABOLIC PANEL
Anion gap: 11 (ref 5–15)
BUN: 28 mg/dL — ABNORMAL HIGH (ref 6–20)
CO2: 22 mmol/L (ref 22–32)
Calcium: 7.8 mg/dL — ABNORMAL LOW (ref 8.9–10.3)
Chloride: 114 mmol/L — ABNORMAL HIGH (ref 98–111)
Creatinine, Ser: 1.51 mg/dL — ABNORMAL HIGH (ref 0.61–1.24)
GFR, Estimated: 53 mL/min — ABNORMAL LOW (ref >60)
Glucose, Bld: 117 mg/dL — ABNORMAL HIGH (ref 70–99)
Potassium: 4.3 mmol/L (ref 3.5–5.1)
Sodium: 147 mmol/L — ABNORMAL HIGH (ref 135–145)

## 2021-12-31 LAB — SURGICAL PATHOLOGY

## 2021-12-31 LAB — ECHOCARDIOGRAM LIMITED
Height: 71.5 in
Weight: 3548.52 oz

## 2021-12-31 LAB — BRAIN NATRIURETIC PEPTIDE: B Natriuretic Peptide: 153.2 pg/mL — ABNORMAL HIGH (ref 0.0–100.0)

## 2021-12-31 IMAGING — DX DG CHEST 1V PORT
1 series · 1 of 1 positions shown · non-contrast
Comparison: Chest radiograph dated [DATE].

CLINICAL DATA: Acute respiratory failure.

EXAM:
PORTABLE CHEST 1 VIEW

[chest ap]
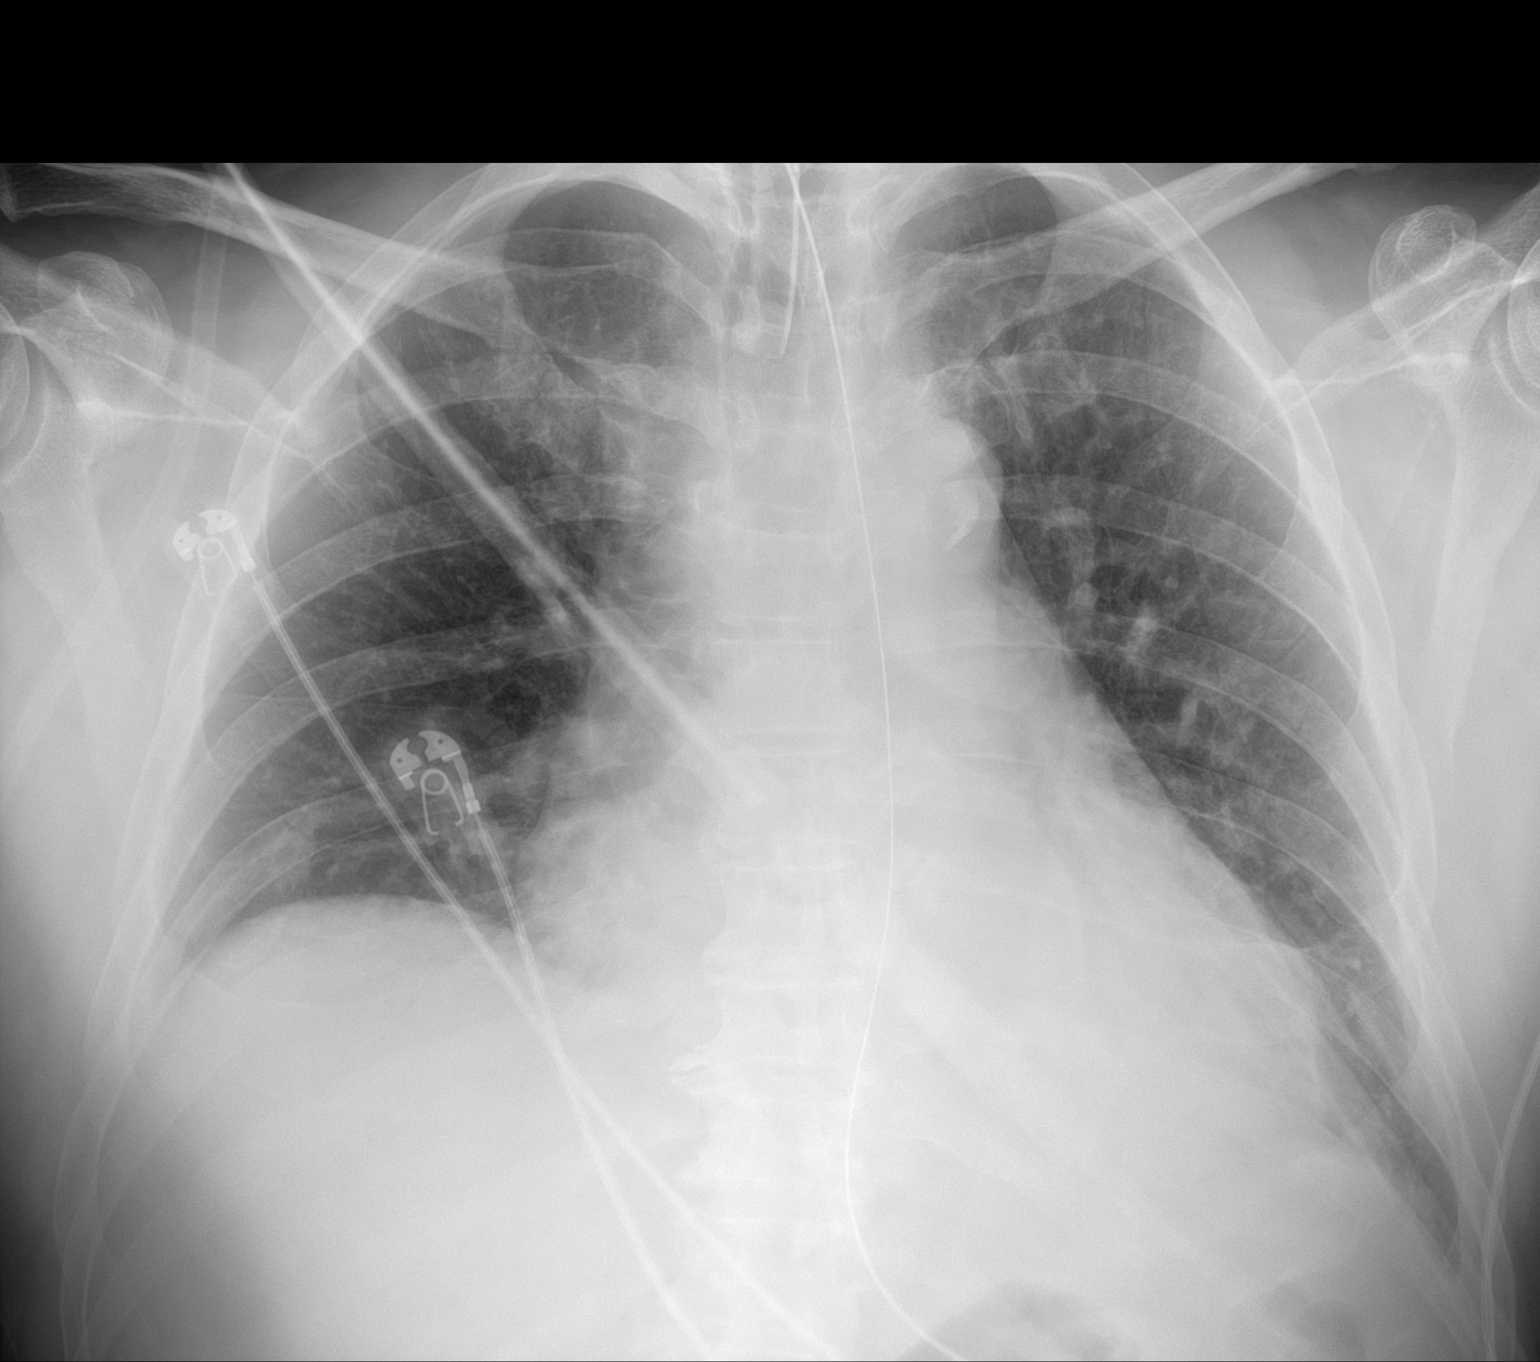

[1 of 1 positions shown; findings below may reference images not displayed]

FINDINGS: Endotracheal tube with tip approximately 6 cm above the carina.
Enteric tube extends below the diaphragm with tip beyond the
inferior margin of the image. There is mild cardiomegaly with mild
vascular congestion. Bibasilar atelectasis. Pneumonia is not
excluded. Trace bilateral pleural effusions may be present. No
pneumothorax. Atherosclerotic calcification of the aortic arch. No
acute osseous pathology.
IMPRESSION: 1. Endotracheal tube with tip above the carina.
2. Mild cardiomegaly with mild vascular congestion.
3. Bibasilar atelectasis. Pneumonia is not excluded.

## 2021-12-31 IMAGING — DX DG CHEST 1V PORT
1 series · 1 of 1 positions shown · non-contrast
Comparison: Same day.

CLINICAL DATA: Hypoxia.

EXAM:
PORTABLE CHEST 1 VIEW

[chest]
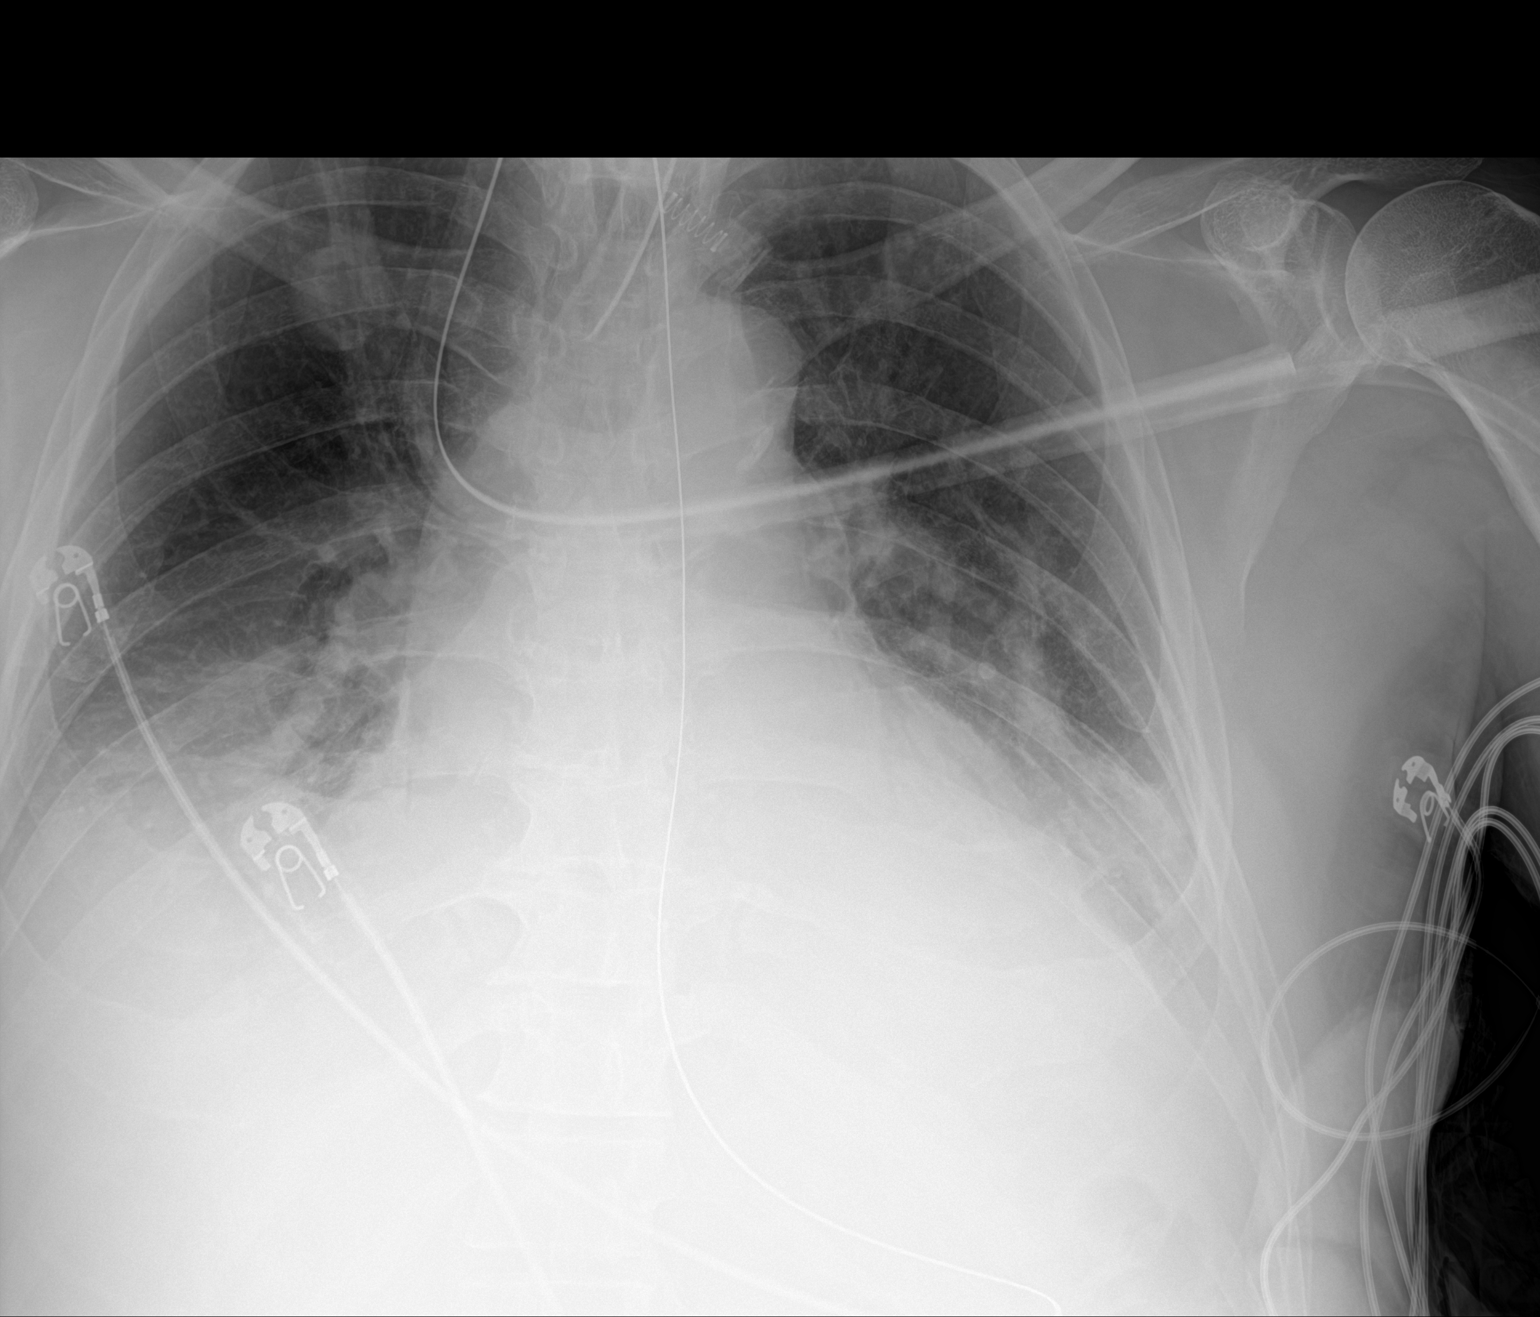

[1 of 1 positions shown; findings below may reference images not displayed]

FINDINGS: Stable cardiomegaly. Endotracheal and nasogastric tubes are
unchanged position. Increased bibasilar opacities are noted
concerning for worsening atelectasis or infiltrates with associated
pleural effusions. Bony thorax is unremarkable.
IMPRESSION: Stable support apparatus. Increased bibasilar opacities are noted
concerning for worsening atelectasis or infiltrates with associated
pleural effusions.

## 2021-12-31 MED ORDER — MIDAZOLAM HCL 2 MG/2ML IJ SOLN: 2.0000 mg | Freq: Once | INTRAMUSCULAR | Status: AC

## 2021-12-31 MED ORDER — PROPOFOL 10 MG/ML IV BOLUS
30.0000 mg | Freq: Once | INTRAVENOUS | Status: AC
  Administered 2021-12-31: 30 mg via INTRAVENOUS

## 2021-12-31 MED ORDER — ALBUTEROL SULFATE (2.5 MG/3ML) 0.083% IN NEBU: 2.5000 mg | INHALATION_SOLUTION | RESPIRATORY_TRACT | Status: DC | PRN

## 2021-12-31 MED ORDER — MIDAZOLAM HCL 2 MG/2ML IJ SOLN
2.0000 mg | Freq: Once | INTRAMUSCULAR | Status: AC
  Administered 2021-12-31: 2 mg via INTRAVENOUS

## 2021-12-31 MED ORDER — PROPOFOL 1000 MG/100ML IV EMUL
INTRAVENOUS | Status: AC
  Filled 2021-12-31: qty 100

## 2021-12-31 MED ORDER — PROPOFOL 1000 MG/100ML IV EMUL
0.0000 ug/kg/min | INTRAVENOUS | Status: DC
  Administered 2021-12-31: 30 ug/kg/min via INTRAVENOUS
  Administered 2021-12-31 – 2022-01-02 (×17): 50 ug/kg/min via INTRAVENOUS
  Administered 2022-01-02: 40 ug/kg/min via INTRAVENOUS
  Administered 2022-01-02 – 2022-01-06 (×24): 50 ug/kg/min via INTRAVENOUS
  Filled 2021-12-31 (×9): qty 100
  Filled 2021-12-31: qty 200
  Filled 2021-12-31 (×10): qty 100
  Filled 2021-12-31: qty 200
  Filled 2021-12-31 (×18): qty 100

## 2021-12-31 MED ORDER — VITAL 1.5 CAL PO LIQD
1000.0000 mL | ORAL | Status: DC
  Administered 2021-12-31 – 2022-01-06 (×5): 1000 mL
  Filled 2021-12-31: qty 1000

## 2021-12-31 MED ORDER — MIDAZOLAM HCL 2 MG/2ML IJ SOLN
INTRAMUSCULAR | Status: AC
  Filled 2021-12-31: qty 2

## 2021-12-31 MED ORDER — SODIUM CHLORIDE 0.9 % IV BOLUS
500.0000 mL | Freq: Once | INTRAVENOUS | Status: AC
  Administered 2021-12-31: 500 mL via INTRAVENOUS

## 2021-12-31 MED ORDER — FUROSEMIDE 10 MG/ML IJ SOLN
40.0000 mg | Freq: Once | INTRAMUSCULAR | Status: AC
  Administered 2021-12-31: 40 mg via INTRAVENOUS
  Filled 2021-12-31: qty 4

## 2021-12-31 MED ORDER — PROPOFOL 500 MG/50ML IV EMUL
INTRAVENOUS | Status: AC
  Filled 2021-12-31: qty 50

## 2021-12-31 MED ORDER — ALBUTEROL SULFATE (2.5 MG/3ML) 0.083% IN NEBU
INHALATION_SOLUTION | RESPIRATORY_TRACT | Status: AC
  Administered 2021-12-31: 2.5 mg
  Filled 2021-12-31: qty 3

## 2021-12-31 NOTE — Progress Notes (Signed)
Pt was having some vent dyssynchrony, CCM at bedside and changed pt back to Automode, pt appears more comfortable w/ less breath stacking noted.  Per discussion w/ CCM, okay to continue Automode if needed to help w/ vent synchrony, but pt may go on PRVC if needed.   ?

## 2021-12-31 NOTE — Progress Notes (Signed)
PT Cancellation Note ? ?Patient Details ?Name: Quasean Frye ?MRN: [id number] ?DOB: November 27, 1960 ? ? ?Cancelled Treatment:    Reason Eval/Treat Not Completed: Medical issues which prohibited therapy. Pt sedated on vent, FiO2 needs gradually decreasing today after increased oxygen requirements overnight. PT will follow up tomorrow.  ? ? ?Arlyss Gandy ?12/31/2021, 2:58 PM ?

## 2021-12-31 NOTE — Progress Notes (Signed)
STROKE TEAM PROGRESS NOTE  ? ?SUBJECTIVE (INTERVAL HISTORY) ?His sister at the bedside.  Patient still intubated, on sedation.  Patient overnight still has fever and worsening respiratory status, put back on vent, not able to wean.  Still have intermittent fever and leukocytosis.  On antibiotics. ? ?OBJECTIVE ?Temp:  [97.9 ?F (36.6 ?C)-101 ?F (38.3 ?C)] 98.2 ?F (36.8 ?C) (04/20 1600) ?Pulse Rate:  [67-233] 102 (04/20 1700) ?Cardiac Rhythm: Sinus tachycardia (04/20 0800) ?Resp:  [17-36] 19 (04/20 1700) ?BP: (82-187)/(54-112) 115/72 (04/20 1700) ?SpO2:  [86 %-100 %] 97 % (04/20 1700) ?FiO2 (%):  [40 %-100 %] 50 % (04/20 1438) ? ?Recent Labs  ?Lab 12/30/21 ?2319 12/31/21 ?4098 12/31/21 ?0725 12/31/21 ?1158 12/31/21 ?1531  ?GLUCAP 140* 121* 117* 102* 105*  ? ?Recent Labs  ?Lab 12/27/21 ?0032 12/28/21 ?0025 12/28/21 ?1626 12/29/21 ?0617 12/30/21 ?0301 12/30/21 ?1191 12/31/21 ?4782 12/31/21 ?9562 12/31/21 ?1308 12/31/21 ?1437  ?NA 143 149*   < > 154* 148* 150* 148* 147* 146* 146*  ?K 4.6 4.2   < > 3.6 4.0 4.1 4.1 4.3 4.3 3.9  ?CL 113* 124*  --  125* 118*  --   --  114*  --   --   ?CO2 22 20*  --  21* 22  --   --  22  --   --   ?GLUCOSE 146* 143*  --  131* 130*  --   --  117*  --   --   ?BUN 53* 46*  --  36* 23*  --   --  28*  --   --   ?CREATININE 2.33* 1.87*  --  1.81* 1.52*  --   --  1.51*  --   --   ?CALCIUM 7.0* 7.4*  --  7.9* 7.5*  --   --  7.8*  --   --   ? < > = values in this interval not displayed.  ? ?Recent Labs  ?Lab 12/25/21 ?1048 12/27/21 ?0032  ?AST 29 33  ?ALT 34 24  ?ALKPHOS 100 47  ?BILITOT 0.5 0.6  ?PROT 5.7* 4.2*  ?ALBUMIN 2.1* 1.6*  ? ?Recent Labs  ?Lab 12/25/21 ?1048 12/25/21 ?1315 12/26/21 ?0230 12/27/21 ?6578 12/28/21 ?4696 12/28/21 ?1023 12/28/21 ?1553 12/29/21 ?0617 12/29/21 ?2952 12/30/21 ?0301 12/30/21 ?8413 12/31/21 ?2440 12/31/21 ?1027 12/31/21 ?2536 12/31/21 ?1437 12/31/21 ?1453  ?WBC 20.0*   < > 22.3*   < > 12.2* 10.5  --  10.4  --  22.8*  --  19.4*  --   --   --   --   ?NEUTROABS 16.3*  --   17.1*  --   --   --   --   --   --   --   --   --   --   --   --   --   ?HGB 11.8*   < > 9.6*   < > 7.6* 6.8*   < > 8.4*   < > 8.4*   < > 9.6* 8.2* 8.5* 8.8* 9.3*  ?HCT 35.0*   < > 27.2*   < > 23.8* 22.4*   < > 26.3*   < > 26.3*   < > 31.3* 24.0* 27.2* 26.0* 31.5*  ?MCV 91.1   < > 85.5   < > 92.6 94.5  --  93.3  --  96.3  --  97.2  --   --   --   --   ?PLT 199   < > 202   < >  332 334  --  352  --  379  --  392  --   --   --   --   ? < > = values in this interval not displayed.  ? ?No results for input(s): CKTOTAL, CKMB, CKMBINDEX, TROPONINI in the last 168 hours. ?No results for input(s): LABPROT, INR in the last 72 hours. ? ?No results for input(s): COLORURINE, LABSPEC, PHURINE, GLUCOSEU, HGBUR, BILIRUBINUR, KETONESUR, PROTEINUR, UROBILINOGEN, NITRITE, LEUKOCYTESUR in the last 72 hours. ? ?Invalid input(s): APPERANCEUR ?  ?   ?Component Value Date/Time  ? CHOL 89 12/26/2021 0230  ? TRIG 222 (H) 12/29/2021 0617  ? HDL <10 (L) 12/26/2021 0230  ? CHOLHDL NOT CALCULATED 12/26/2021 0230  ? VLDL 43 (H) 12/26/2021 0230  ? LDLCALC NOT CALCULATED 12/26/2021 0230  ? ?Lab Results  ?Component Value Date  ? HGBA1C 6.0 (H) 12/25/2021  ? ?   ?Component Value Date/Time  ? LABOPIA NONE DETECTED 12/26/2021 0116  ? COCAINSCRNUR NONE DETECTED 12/26/2021 0116  ? LABBENZ NONE DETECTED 12/26/2021 0116  ? AMPHETMU NONE DETECTED 12/26/2021 0116  ? THCU NONE DETECTED 12/26/2021 0116  ? LABBARB NONE DETECTED 12/26/2021 0116  ?  ?No results for input(s): ETH in the last 168 hours. ? ?I have personally reviewed the radiological images below and agree with the radiology interpretations. ? ?DG Abd 1 View ? ?Result Date: 12/25/2021 ?CLINICAL DATA:  Orogastric tube placement. EXAM: ABDOMEN - 1 VIEW COMPARISON:  None. FINDINGS: An orogastric tube is seen with its distal tip overlying the expected region of the gastric antrum. The bowel gas pattern is normal. No radio-opaque calculi or other significant radiographic abnormality are seen. IMPRESSION:  Orogastric tube positioning, as described above. Electronically Signed   By: Aram Candela M.D.   On: 12/25/2021 19:09  ? ?DG Abd 1 View ? ?Result Date: 12/25/2021 ?CLINICAL DATA:  OG tube placement. EXAM: ABDOMEN - 1 VIEW COMPARISON:  One view chest x-ray 12:45 p.m. FINDINGS: Gaseous distention of the stomach noted. Bowel gas pattern otherwise unremarkable. OG tube is not visualized over the distal esophagus or stomach. IMPRESSION: OG tube is not visualized over the distal esophagus or stomach. Gaseous distention of the stomach. Electronically Signed   By: Marin Roberts M.D.   On: 12/25/2021 19:06  ? ?MR ANGIO HEAD WO CONTRAST ? ?Result Date: 12/26/2021 ?CLINICAL DATA:  Acute infarct. Revascularization of superior right M2 segment. EXAM: MRA HEAD WITHOUT CONTRAST TECHNIQUE: Angiographic images of the Circle of Willis were acquired using MRA technique without intravenous contrast. COMPARISON:  CTA head and neck 12/25/2021.  Revascularization films. FINDINGS: Anterior circulation: The internal carotid arteries are within normal limits from high cervical segments through the ICA termini bilaterally. The A1 and M1 segments are normal. MCA bifurcations are within normal limits. MCA bifurcations are intact. The revascularized superior right M2 segments remain patent. ACA and MCA branch vessels are normal bilaterally. Posterior circulation: The vertebral arteries are codominant. AICA vessels are dominant. The basilar artery is within normal limits. Right posterior cerebral artery originates from basilar tip. Left posterior cerebral artery is of fetal type. A left P1 segment contributes. PCA branch vessels are within normal limits bilaterally. Anatomic variants: None Other: None. IMPRESSION: 1. Normal variant MRA Circle of Willis without evidence for significant proximal stenosis, aneurysm, or branch vessel occlusion. 2. The revascularized superior right M2 segments remain patent. Electronically Signed   By:  Marin Roberts M.D.   On: 12/26/2021 16:54  ? ?MR BRAIN WO CONTRAST ? ?Result  Date: 12/26/2021 ?CLINICAL DATA:  Status post revascularization of right superior M2 occlusion. EXAM: MRI HEAD WITHOUT CONTRAST TECHNIQUE: Multiplanar, multiecho pulse sequences of the brain and surrounding structures were obtained without intravenous contrast. COMPARISON:  CTA head and neck, CT perfusion, and revascularization images 12/25/21 FINDINGS: Brain: Diffusion-weighted images demonstrate acute hemorrhage within the superior right MCA division involving the right insular cortex and frontal lobe. Diffuse cortical infarct involves the high right parietal cortex. Sparing of the more inferior parietal lobe and superior temporal lobe noted. Focal area of nonhemorrhagic infarct is present in the right occipital pole. Contralateral left acute parietal and posterior left frontal lobe cortical infarcts are present as well. T2 hyperintensities are associated with the areas of acute/subacute infarction. Focal susceptibility involving the left parietal infarct noted. Additional areas of cortical hemorrhage are present in the high posterior right parietal lobe and in the right frontal operculum. Smaller foci of hemorrhage are present in the posterior left frontal infarct. Minimal white matter changes are present otherwise. The ventricles are of normal size. No significant extraaxial fluid collection is present. The internal auditory canals are within normal limits. The brainstem and cerebellum are within normal limits. Vascular: Flow is present in the major intracranial arteries. Skull and upper cervical spine: The craniocervical junction is normal. Upper cervical spine is within normal limits. Marrow signal is unremarkable. Sinuses/Orbits: Bilateral mastoid effusions are present. There is some fluid in the posterior nasopharynx. OG tube and endotracheal tube are in place. The paranasal sinuses and mastoid air cells are otherwise clear.  The globes and orbits are within normal limits. IMPRESSION: 1. Acute/subacute cortical infarct involving the superior right MCA division involving the right insular cortex and frontal lobe spite revascular

## 2021-12-31 NOTE — Progress Notes (Signed)
eLink Physician-Brief Progress Note ?Patient Name: Vincent Ferrell ?DOB: December 11, 1960 ?MRN: [id number] ? ? ?Date of Service ? 12/31/2021  ?HPI/Events of Note ? BP 95/66, MAP 76  ?eICU Interventions ? BNP ordered to gauge volume status, no further intervention for now since MAP is 76.  Will continue to monitor blood pressure.  ? ? ? ?  ? ?Thomasene Lot Chibuike Fleek ?12/31/2021, 1:27 AM ?

## 2021-12-31 NOTE — Progress Notes (Signed)
?  Echocardiogram ?2D Echocardiogram has been performed. ? ?Gerda Diss ?12/31/2021, 11:48 AM ?

## 2021-12-31 NOTE — Progress Notes (Signed)
Pt's sister contacted at phone # on file but could not leave a voicemail. Therefore, I updated pt's significant other on the events and interventions that took place this HS shift. Significant other voiced an understanding and thanked this RN for the update and the care that I provide to the pt. ?

## 2021-12-31 NOTE — Progress Notes (Signed)
OT Cancellation Note ? ?Patient Details ?Name: Vincent Ferrell ?MRN: [id number] ?DOB: 27-Apr-1961 ? ? ?Cancelled Treatment:    Reason Eval/Treat Not Completed: Medical issues which prohibited therapy.  OT to continue efforts as appropriate next date.   ? ?[name] D Claudis Giovanelli ?12/31/2021, 3:07 PM ?

## 2021-12-31 NOTE — Progress Notes (Signed)
Nutrition Follow-up ? ?DOCUMENTATION CODES:  ? ?Not applicable ? ?INTERVENTION:  ? ?Tube feeding via OG tube: ?Increase Vital 1.5 to goal 55 ml/h (1320 ml per day) ?Prosource TF 45 ml BID ? ?Provides 2060 kcal, 111 gm protein, 1008 ml free water daily ? ? ?NUTRITION DIAGNOSIS:  ? ?Inadequate oral intake related to inability to eat as evidenced by NPO status. ?Ongoing.  ? ?GOAL:  ? ?Patient will meet greater than or equal to 90% of their needs ?Met with TF at goal  ? ?MONITOR:  ? ?Vent status, Labs ? ?REASON FOR ASSESSMENT:  ? ?Ventilator ?  ? ?ASSESSMENT:  ? ?61 y.o. male presented to the ED with L side weakness, facial droop, and R gaze deviation. PMH includes HTN. Pt admitted with R MCA stroke with right MCA M2 occlusion and sepsis. ? ?Pt discussed during ICU rounds and with RN, MD, and NP. Pt remains intubated. Pt with progressive hypoxemia overnight per chest xray pt with increased bibasilar infiltrates. Started on propofol due to vent dyssynchrony.   ?Pt with acute hematochezia, EGD and colonoscopy clear without active bleeding.  ? ?Pt with RLL PNA, prostate abscess, acute R MCA, and MV endocarditis.  ? ?4/14 - intubated, acute R MCA stroke s/p mechanical thrombectomy ?4/17 - s/p bedside colonoscopy; s/p TEE with MV endocarditis ?4/18 s/p bedside EGD and repeat colonoscopy, large prostate abscess per MRI  ? ?Medications reviewed and include: colace, SSI, protonix, miralax ?Vancomycin ?Propofol @ 24 ml/hr provides: 633 kcal  ? ?Labs reviewed: Na 146 ?CBG's: 102-121 ? ?OG tube: per xray in gastric antrum  ? ?Diet Order:   ?Diet Order   ? ? None  ? ?  ? ? ?EDUCATION NEEDS:  ? ?Not appropriate for education at this time ? ?Skin:  Skin Assessment: Skin Integrity Issues: ?Skin Integrity Issues:: Incisions ?Incisions: perineum ? ?Last BM:  4/18 small ? ?Height:  ? ?Ht Readings from Last 1 Encounters:  ?12/25/21 5' 11.5" (1.816 m)  ? ? ?Weight:  ? ?Wt Readings from Last 1 Encounters:  ?12/27/21 100.6 kg  ? ? ?BMI:   Body mass index is 30.5 kg/m?. ? ?Estimated Nutritional Needs:  ? ?Kcal:  2000-2200 ? ?Protein:  100-115 grams ? ?Fluid:  >/= 2 L ? ?Lockie Pares., RD, LDN, CNSC ?See AMiON for contact information  ? ?

## 2021-12-31 NOTE — Progress Notes (Signed)
CSW received request to contact patient's sister regarding short term disability. CSW spoke with [facility] and she stated she started a claim for patient through his employer [employer] [phone]) but that they would not release the paperwork needed to her without speaking with the hospital to confirm. CSW contacted them and spoke with Taiyna (Claim# [id number]). She stated patient's employer had contacted them and provided permission to release paperwork to [facility] and they will send the paperwork out shortly. CSW made Beth aware.  ? ?Cristobal Goldmann ?LCSW, MSW, MHA ? ?

## 2021-12-31 NOTE — Progress Notes (Signed)
? ?NAME:  Vincent GowdaDavid Ferrell, MRN:  [id number], DOB:  [birth year], LOS: 6 ?ADMISSION DATE:  12/25/2021, CONSULTATION DATE:  12/25/2021 ?REFERRING MD:  Dr. Corliss Skainseveshwar, CHIEF COMPLAINT:  left sided weakness  ? ?History of Present Illness:  ?HPI mostly obtained from medical chart review.  Attempted from patient but limited given aphasia.  ? ?61 year old male with prior history of HTN, HLD, and GERD who presented to ER as code stroke.  Patient was at the vet's office when he acutely developed left sided weakness and right gaze deviation at 0945.  Code stroke activated by EMS in addition to code STEMI based on diffuse STE.  Of note, patient recently diagnosed with RLL bronchopneumonia two days ago by his PCP.   ? ?He was evaluated by Neurology and Cardiology in ER, HR in 150s with no chest pain or dyspnea, with EKG not meeting criteria for STEMI but showed mild STE, felt possibly rate related versus possible pericarditis.  Echo and cardiac workup ordered.  CT head showed concern for subacute infarcts in the R PCA territory and two small areas possibly representing calcification or blood, therefore thrombolytics deferred.  CTA head/ neck and perfusion study showed acute right MCA anterior M2 occlusion.   ?Labs significant for WBC 20.6, Hgb 9.2, Hct 26.8, plts 205, normal coags, K 4.9, bicarb 21, glucose 138, BUN 34, sCr 1.83, iCa 1.03, low albumin/ protein, lactic acid 1.5. Trop hs pending.  CXR showing elevation of right hemidiaphragm with pulmonary vascular congestion.  Blood cultures sent.   Temp 100 in ER, remains ST, and initial BP 140/90, and remains on room air > 94%.  He was taken emergently to Neuro IR for mechanical thrombectomy with complete revascularization achieving TICI 3 revascularization.  Patient was extubated post intervention.  Briefly required phenylephrine in PACU.  Received 2L LR and albumin in procedure.  PCCM consulted for further medical assistance given concern for sepsis and possible cardioembolic  event related to stroke. ? ?Patient is currently only able to nod yes/ no to question given expressive aphasia and mostly incomprehensible speech.  Nodded yes to recent fever, cough with productive sputum, and headache.  Nods yes to recent bloody stools.  Did indicate that he started antibiotics from PCP.  Denied current or previous SOB, CP, palpitations, N/V/abd pain, lower leg swelling.   Patient indicates he lives alone, not married, no children, and has living parents out of state.  Non smoker, occasional but not daily ETOH, denies drug abuse.  ? ?Pertinent  Medical History  ?HLT, HLD, GERD, rising PSA level, impaired fasting glucose ? 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  Cards consulted for initial code STEMI with diffuse STE, no STEMI. Required 4 units of pRBCs for lower GI bleed.  ?4/17: Large bloody BM. Bedside colonoscopy with blood throughout colon. 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 Hemoglobin decreased to 6.8. Additional 2 units of pRBCs and FFP given.  ?4/18: Bedside EGD and colonoscopy with evidence of black blood but no source of bleeding identified. MRI of the prostate demonstrated large abscess. Patient taken for drainage by Urology. Cultures obtained.  ? ?Interim History / Subjective:  ? ?Overnight, patient's blood pressure was noted to be on the lower end of normal. He received IVF totaling 1L. In addition, patient had sudden onset respiratory distress. CXR demonstrated mild volume overload and ETT 6 cm above  carina. ETT advanced. Patient's sedation increased. ABG with pH of 7.2, with pCO2 of 55 and pO2 of 110. Set rate increased from 16 to 20. FiO2 increased to 100.  ? ?This AM, patient noted to be double stacking and dyssynchronous. Oxygen saturation as low as 86-89% on 100% FiO2. Repeat ABG demonstrated pO2  of 64. CXR ordered.  ? ?Sister and girlfriend at bedside and updated.   ? ?Objective   ?Blood pressure 109/75, pulse 70, temperature 97.9 ?F (36.6 ?C), temperature source Oral, resp. rate 17, height 5' 11.5" (1.816 m), weight 100.6 kg, SpO2 100 %. ?   ?Vent Mode: PRVC ?FiO2 (%):  [40 %-100 %] 100 % ?Set Rate:  [16 bmp-20 bmp] 20 bmp ?Vt Set:  [600 mL] 600 mL ?PEEP:  [5 cmH20] 5 cmH20 ?Pressure Support:  [8 cmH20-12 cmH20] 12 cmH20 ?Plateau Pressure:  [12 cmH20-17 cmH20] 17 cmH20  ? ?Intake/Output Summary (Last 24 hours) at 12/31/2021 0742 ?Last data filed at 12/31/2021 0700 ?Gross per 24 hour  ?Intake 3210.44 ml  ?Output 2425 ml  ?Net 785.44 ml  ? ? ?Filed Weights  ? 12/25/21 1000 12/27/21 0500  ?Weight: 100.8 kg 100.6 kg  ? ?Examination:  ?General: Acutely ill-appearing middle-age male, lying on the bed, orally intubated ?HEENT: Moist mucus membranes. ETT/OGT in place ?Neuro: Able to follow commands. ?CV: Regular rhythm with tachycardia. No rub or murmurs. JVD noted.  ?PULM: Fine inspiratory crackles in the left lung base. No wheezes.  ?GI: Soft, non-distended, normoactive bowel sounds present. 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 Stable ? ?Bedside ultrasound: Findings concerning for RV overload with septal deviation noted. No evidence of pneumothorax bilaterally.  ? ?Resolved Hospital Problem list   ?Septic Shock, resolved  ?Ileus/partial small bowel obstruction ? ?Assessment & Plan:  ? ?Mitral Valve Endocarditis  ?- TEE on 4/17 with 1 cm x 0.5 cm vegetation involving both atrial and ventricular sides.  ?No significant stenosis or regurgitation on initial TEE.  ?- Blood cultures negative on admission, however on Doxycycline PTA ?- No definite source identified as of yet. It is possible prostate abscess is the source though. Urine cultures with Staph aureus. Prostate cultures still pending. ?-  Infectious Disease following; appreciate their recommendations  ?- Continue Vancomycin and Ceftriaxone  ?- Given increase in oxygen requirement with potential volume overload, there is concern for mitral valve insufficiency. Due to this, TCTS has been consulted. Will also repeat TTE.  ?- Lasix 40 mg IV once  ? ?Acute hypoxic respiratory failure ?Multi-focal pneumonia ?- Diagnosed with RLL pneumonia PTA on Doxycycline. CTA on admission with results concerning for multi-focal pneumonia involving bilateral upper and lower lobes ?- Increase in oxygen requirement on AM of 4/20. Differential includes worsening agitation versus volume overload versus PE. Will obtain TTE to evaluate LV and MV function. Holding off on CTA for now. If patient does have a PE, he would not be a candidate for anti-coagulation given severe GI bleed during this admission.  ?- Lasix 40 mg IV once  ?- Continue lung protective ventilation ?- VAP bundle ?- PAD protocol with RASS goal 0/-1 with Propofol and fentanyl PRN ?- IV Ceftriaxone for coverage of aspiration pneumonia ?- Tracheal aspirate cultures pending ? ?Prostate Gland Abscess ?- Noted on recent CTA, area of lobulation within the inferolateral portion of the prostate. PSA elevated at 25.7. Previously mildly elevated at 1.6. ?- Urology following; appreciate their recommendations  ?- MRI  prostate consistent with abscess s/p transurethral resection on 4/18. Urine cultures growing Staph Aureus  ?- Will need to continue foley catheter for at least 2 weeks  ? ?Acute R MCA, with M2 occlusion s/p mechanical thrombectomy with complete revascularization ?Bilateral Embolic Infarcts  ?- Given MV endocarditis and no prior history of atherosclerotic disease, suspect this may be been secondary to septic emboli.  ?- Examination demonstrates persistent left-sided hemiplegia with fixed vertical gaze ?- Stroke team is following ?- Continue secondary stroke prophylaxis ?- SBP goal 120-140 ?- Continue high  intensity statin, Lipitor  ? ?Acute blood loss anemia s/p multiple pRBC transfusions ?Lower GI bleeding ?- Patient endorsed blood stools PTA. Significant lower GI bleed throughout hospitalization.  ?- GI following; a

## 2021-12-31 NOTE — Progress Notes (Signed)
Pt w/ SBP in 80's. Elink MD informed. ?

## 2021-12-31 NOTE — Progress Notes (Addendum)
eLink Physician-Brief Progress Note ?Patient Name: Vincent Ferrell ?DOB: 09-30-1960 ?MRN: [id number] ? ? ?Date of Service ? 12/31/2021  ?HPI/Events of Note ? Patient with abrupt onset of respiratory distress as the nurses were about to begin bathing him.  ?eICU Interventions ? Breath sounds bilateral per RT and BS RN,  ET tube position in airway confirmed by end tidal CO2, stat portable CXR shows the tube to be at the level of the clavicles and it was advanced 2 cm, patient received Versed 2 mg iv x 2 and Fentanyl 100 mcg iv x 1, Precedex gtt was increased from 0.25 mcg to 0.6 mcg, breathing treatment ordered x 1 for wheezing, PCCM ground team was requested to evaluate the patient. Dr. Tacy Learn is now at bedside.  ? ? ? ?  ? ?[name] ?12/31/2021, 3:22 AM ?

## 2021-12-31 NOTE — Progress Notes (Signed)
?   ? ?[facility] ? ?Date of Admission:  12/25/2021    ?       ?Reason for visit: Follow up on Endocarditis ?  ?Current antibiotics: ?Vancomycin and ceftriaxone ? ?ASSESSMENT:   ? ?61 y.o. male admitted with: ? 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  Blood cultures x4 are no growth to date after being obtained following 3 to 4 days of doxycycline prescribed as an outpatient. ?Prostate abscess: Initial prostate abnormalities noted on CT from admission with MRI 4/18 confirming a large abscess.  Taken to the OR 4/18 with urology for unroofing of the abscess and cultures obtained.  Urine cx with Staph aureus and abscess cx with GPC on Gram stain and lab feels confident is also Staph aureus. ?Bilateral CNS embolic infarcts: Suspected secondary to mitral valve endocarditis. ?Multifocal pneumonia with hypoxic respiratory failure: Requiring intubation with worsening oxygenation over the past 24 hrs and imaging concerns for volume overload. ?Acute kidney injury: Creatinine today is stable. ?GI bleed: Colonoscopy and EGD with out obvious source of bleeding.  ? ?RECOMMENDATIONS:   ? ?Continue current vancomycin and ceftriaxone ?Follow cultures ?Repeat TTE to ensure no new valvular complications in setting of MV endocarditis ?CVTS consult ?Diuresis per CCM ? ? ?Principal Problem: ?  Endocarditis of mitral valve ?Active Problems: ?  Acute ischemic right MCA stroke ([place]) ?  Middle cerebral artery embolism, right ?  Leukocytosis ?  Sinus tachycardia ?  Acute respiratory failure with hypoxia ([place]) ?  Prostate abscess ?  Acute kidney injury ([place]) ?  Multifocal pneumonia ?  Anemia due to GI blood loss ?  Hematochezia ? ? ? ?MEDICATIONS:   ? ?Scheduled Meds: ?? atorvastatin  40 mg Per Tube Daily  ?? chlorhexidine gluconate (MEDLINE KIT)  15 mL Mouth Rinse BID   ?? Chlorhexidine Gluconate Cloth  6 each Topical Daily  ?? cloNIDine  0.1 mg Per Tube TID  ?? docusate  100 mg Per Tube BID  ?? feeding supplement (PROSource TF)  45 mL Per Tube BID  ?? free water  200 mL Per Tube Q4H  ?? insulin aspart  0-15 Units Subcutaneous Q4H  ?? mouth rinse  15 mL Mouth Rinse 10 times per day  ?? pantoprazole (PROTONIX) IV  40 mg Intravenous Q12H  ?? polyethylene glycol  17 g Per Tube Daily  ?? sodium chloride flush  3 mL Intravenous Once  ? ?Continuous Infusions: ?? sodium chloride    ?? sodium chloride 10 mL/hr at 12/31/21 0800  ?? cefTRIAXone (ROCEPHIN)  IV Stopped (12/30/21 2204)  ?? dexmedetomidine (PRECEDEX) IV infusion Stopped (12/31/21 0602)  ?? feeding supplement (VITAL 1.5 CAL) 1,000 mL (12/30/21 1714)  ?? vancomycin Stopped (12/31/21 0135)  ? ?PRN Meds:.Place/Maintain arterial line **AND** sodium chloride, sodium chloride, acetaminophen **OR** acetaminophen (TYLENOL) oral liquid 160 mg/5 mL **OR** acetaminophen, albuterol, fentaNYL (SUBLIMAZE) injection, fentaNYL (SUBLIMAZE) injection, HYDROcodone-acetaminophen, senna-docusate ? ?SUBJECTIVE:  ? ?24 hour events:  ?Soft blood pressures and respiratory issues noted overnight ?WBC slightly improved ?Creatinine is stable ?Prostate abscess cultures pending ?Blood cultures x4 remain no growth ?Remains febrile, Tmax 102.1 ? ?Remains intubated and sedated. ? ?Review of Systems  ?Unable to perform ROS: Intubated  ? ?  ?OBJECTIVE:  ? ?Blood pressure (!) 183/87, pulse 92, temperature 97.9 ?F (36.6 ?C), temperature source Oral, resp. rate (!) 29, height 5' 11.5" (1.816 m), weight 100.6  kg, SpO2 91 %. ?Body mass index is 30.5 kg/m?. ? ?Physical Exam ?Constitutional:   ?   Comments: Ill appearing, intubated, sedated.   ?HENT:  ?   Head: Normocephalic and atraumatic.  ?   Mouth/Throat:  ?   Comments: ET tube in place.  ?Cardiovascular:  ?   Rate and Rhythm: Normal rate and regular rhythm.  ?Pulmonary:  ?   Comments: Ventilated breath sounds,  symmetric chest rise and fall.  ?Abdominal:  ?   General: There is no distension.  ?   Palpations: Abdomen is soft.  ?   Tenderness: There is no abdominal tenderness.  ?Musculoskeletal:  ?   Cervical back: Normal range of motion and neck supple.  ?   Comments: Pedal edema  ?Skin: ?   General: Skin is warm and dry.  ?   Findings: No rash.  ?Neurological:  ?   Comments: Sedated  ? ? ? ?Lab Results: ?Lab Results  ?Component Value Date  ? WBC 19.4 (H) 12/31/2021  ? HGB 9.6 (L) 12/31/2021  ? HCT 31.3 (L) 12/31/2021  ? MCV 97.2 12/31/2021  ? PLT 392 12/31/2021  ?  ?Lab Results  ?Component Value Date  ? NA 147 (H) 12/31/2021  ? K 4.3 12/31/2021  ? CO2 22 12/31/2021  ? GLUCOSE 117 (H) 12/31/2021  ? BUN 28 (H) 12/31/2021  ? CREATININE 1.51 (H) 12/31/2021  ? CALCIUM 7.8 (L) 12/31/2021  ? GFRNONAA 53 (L) 12/31/2021  ?  ?Lab Results  ?Component Value Date  ? ALT 24 12/27/2021  ? AST 33 12/27/2021  ? ALKPHOS 47 12/27/2021  ? BILITOT 0.6 12/27/2021  ? ? ?No results found for: CRP ? ?   ?Component Value Date/Time  ? ESRSEDRATE 50 (H) 12/25/2021 1730  ? ?  ?I have reviewed the micro and lab results in Epic. ? ?Imaging: ?MR PELVIS W WO CONTRAST ? ?Result Date: 12/29/2021 ?CLINICAL DATA:  Prostatitis concern for abscess. EXAM: MRI PELVIS WITHOUT AND WITH CONTRAST TECHNIQUE: Multiplanar multisequence MR imaging of the pelvis was performed both before and after administration of intravenous contrast. CONTRAST:  66m GADAVIST GADOBUTROL 1 MMOL/ML IV SOLN COMPARISON:  CT December 26, 2021 FINDINGS: Urinary Tract: Wall thickening of an incompletely distended urinary bladder. Bowel:  No evidence of bowel obstruction. Vascular/Lymphatic: No pathologically enlarged lymph nodes. No significant vascular abnormality seen. Reproductive: MRI findings consistent with prostatitis with a rim enhancing thick walled fluid collection measuring 3.7 x 2.8 cm along the left side of the prostate gland on image 28/5 most consistent with an intra prostatic  abscess. Other: Trace pelvic free fluid is likely reactive. Small volume nonspecific subcutaneous edema. Musculoskeletal: Symmetric edema in the bilateral hip adductors, no intramuscular fluid collection or abnormal enhancement. No suspicious osseous lesion. IMPRESSION: 1. MRI findings consistent with prostatitis with a 3.7 x 2.8 cm rim enhancing fluid collection along the left side of the prostate gland most consistent with an intraprostatic abscess. 2. Wall thickening of the incompletely distended urinary bladder. Correlate for bladder outlet obstruction. 3. Symmetric edema in the bilateral hepatic dome may reflect nonspecific myositis. No evidence of pyomyositis. Electronically Signed   By: JDahlia BailiffM.D.   On: 12/29/2021 19:10  ? ?CT MAXILLOFACIAL W CONTRAST ? ?Result Date: 12/29/2021 ?CLINICAL DATA:  Endocarditis.  Search for source. EXAM: CT MAXILLOFACIAL WITH CONTRAST TECHNIQUE: Multidetector CT imaging of the maxillofacial structures was performed with intravenous contrast. Multiplanar CT image reconstructions were also generated. RADIATION DOSE REDUCTION: This exam was performed according to  the departmental dose-optimization program which includes automated exposure control, adjustment of the mA and/or kV according to patient size and/or use of iterative reconstruction technique. CONTRAST:  145m OMNIPAQUE IOHEXOL 300 MG/ML  SOLN COMPARISON:  None. FINDINGS: Osseous: No fracture or mandibular dislocation. No destructive process. No focal dental abnormality. Orbits: Negative. No traumatic or inflammatory finding. Sinuses: Clear. Soft tissues: Negative. Limited intracranial: No significant or unexpected finding. IMPRESSION: Normal maxillofacial CT.  No focal dental abnormality. Electronically Signed   By: KUlyses JarredM.D.   On: 12/29/2021 19:31  ? ?DG Chest Port 1 View ? ?Result Date: 12/31/2021 ?CLINICAL DATA:  Acute respiratory failure. EXAM: PORTABLE CHEST 1 VIEW COMPARISON:  Chest radiograph dated  12/26/2011. FINDINGS: Endotracheal tube with tip approximately 6 cm above the carina. Enteric tube extends below the diaphragm with tip beyond the inferior margin of the image. There is mild cardiomegaly with m

## 2021-12-31 NOTE — Progress Notes (Signed)
Arterial line removed. Unable to draw sample. Dampened wave form.  ?

## 2021-12-31 NOTE — Progress Notes (Signed)
Spoke w/ CCM resident MD, per discussion continue to wean FIO2 as sats tolerate then ABG once fio2 weaned.  Pt was weaned to 50%, sat 98-97%.  Will assess if fio2 can be weaned again, then ABG later (per discussion w/ MD).   ?

## 2021-12-31 NOTE — Progress Notes (Signed)
eLink Physician-Brief Progress Note ?Patient Name: Vincent Ferrell ?DOB: [birth year] ?MRN: [id number] ? ? ?Date of Service ? 12/31/2021  ?HPI/Events of Note ? BP 94/69, MAP 77, BNP 153.  ?eICU Interventions ? Normal Saline 500 ml iv bolus x 1 ordered.  ? ? ? ?  ? ?Frederik Pear ?12/31/2021, 6:47 AM ?

## 2021-12-31 NOTE — Plan of Care (Signed)
?  Problem: Education: ?Goal: Knowledge of disease or condition will improve ?12/31/2021 0221 by Ronalee Red, RN ?Outcome: Progressing ?12/30/2021 2321 by Ronalee Red, RN ?Outcome: Progressing ?  ?Problem: Health Behavior/Discharge Planning: ?Goal: Ability to manage health-related needs will improve ?12/31/2021 0221 by Ronalee Red, RN ?Outcome: Progressing ?12/30/2021 2321 by Ronalee Red, RN ?Outcome: Progressing ?  ?Problem: Self-Care: ?Goal: Ability to participate in self-care as condition permits will improve ?12/31/2021 0221 by Ronalee Red, RN ?Outcome: Progressing ?12/30/2021 2321 by Ronalee Red, RN ?Outcome: Progressing ?  ?Problem: Nutrition: ?Goal: Risk of aspiration will decrease ?12/31/2021 0221 by Ronalee Red, RN ?Outcome: Progressing ?12/30/2021 2321 by Ronalee Red, RN ?Outcome: Progressing ?  ?Problem: Elimination: ?Goal: Will not experience complications related to bowel motility ?Outcome: Progressing ?  ?Problem: Pain Managment: ?Goal: General experience of comfort will improve ?Outcome: Progressing ?  ?Problem: Skin Integrity: ?Goal: Risk for impaired skin integrity will decrease ?12/31/2021 0221 by Ronalee Red, RN ?Outcome: Progressing ?12/30/2021 2321 by Ronalee Red, RN ?Outcome: Progressing ?  ?

## 2021-12-31 NOTE — Consult Note (Addendum)
? ?   ?[address] ?      [place] [address] ?            [phone]       ? ?Nelta Numbers ?[facility] Record #[id number] ?Date of Birth: Mar 31, 1961 ? ?Referring: CCM ?Primary Care: Houston Siren., MD ?Primary Cardiologist:None ? ?Chief Complaint:  MV Endocarditis ? ?History of Present Illness:     ? ?Vincent Ferrell is a 61 y.o. 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 He has a history of hypertension, dyslipidemia and pre-diabetes. A few days prior to his admission he was seen outpatient for an illness that started in early April including fevers, body aches, fatigue and bruising on his hands and given a prescription for doxycycline for presumed sinusitis.  Upon arrival patient underwent intervention via IR for thrombectomy.  He was admitted to Neuro ICU post procedure and CCM consult was obtained.  They felt patient required ABX for RLL pneumonia with sepsis.  There was concern for GI bleeding due to hematochezia in hospital and GI consult was obtained.  He also required intubation and remains on ventilator.  GI workup did not identify active source of bleeding on UGI or colonoscopy.  Infectious disease consult was obtained who felt patient's presentation was concerning for endocarditis.  Echocardiogram obtained showed normal valvular function but evidence of vegetation on the Mitral Valve.  CT surgery has been consulted.  Patient is fully sedated on vent.  Family in the room states patient became very agitated this morning when they attempted to wean sedation. ? ?Current Activity/ Functional Status: ?Patient was independent with mobility/ambulation, transfers, ADL's, IADL's. ?  ?Zubrod Score: ?At the time of surgery this patient?s most appropriate activity status/level should be described as: ?_0     0    Normal activity, no symptoms ?_1     1    Restricted  in physical strenuous activity but ambulatory, able to do out light work ?_2     2    Ambulatory and capable of self care, unable to do work activities, up and about                 more than 50%  Of the time                            ?_3     3    Only limited self care, in bed greater than 50% of waking hours ?_4     4    Completely disabled, no self care, confined to bed or chair ?_5     5    Moribund ? ?Past Medical History:  ?Diagnosis Date  ? Chest pain in adult 10/24/2017  ? Elevated BP without diagnosis of hypertension 10/24/2017  ? Impaired fasting glucose 10/24/2017  ? Mixed hyperlipidemia 10/24/2017  ? Rising PSA level 10/24/2017  ? ? ?Past Surgical History:  ?Procedure Laterality Date  ? COLONOSCOPY WITH PROPOFOL N/A 12/28/2021  ? Procedure: COLONOSCOPY WITH PROPOFOL;  Surgeon: Thornton Park, MD;  Location: [facility];  Service: Gastroenterology;  Laterality: N/A;  ? COLONOSCOPY WITH PROPOFOL N/A 12/29/2021  ? Procedure: COLONOSCOPY WITH PROPOFOL;  Surgeon: Thornton Park, MD;  Location: [facility];  Service: Gastroenterology;  Laterality: N/A;  ? ESOPHAGOGASTRODUODENOSCOPY (EGD) WITH PROPOFOL N/A 12/29/2021  ? Procedure: ESOPHAGOGASTRODUODENOSCOPY (EGD) WITH PROPOFOL;  Surgeon: Tarri Glenn,  Joelene Millin, MD;  Location: [facility];  Service: Gastroenterology;  Laterality: N/A;  ? IR CT HEAD LTD  12/25/2021  ? IR PERCUTANEOUS ART THROMBECTOMY/INFUSION INTRACRANIAL INC DIAG ANGIO  12/25/2021  ? RADIOLOGY WITH ANESTHESIA N/A 12/25/2021  ? Procedure: IR WITH ANESTHESIA;  Surgeon: Radiologist, Medication, MD;  Location: [facility];  Service: Radiology;  Laterality: N/A;  ? TRANSURETHRAL RESECTION OF PROSTATE N/A 12/29/2021  ? Procedure: TRANSURETHRAL RESECTION OF THE PROSTATE (TURP);  Surgeon: Lucas Mallow, MD;  Location: [facility];  Service: Urology;  Laterality: N/A;  ? ? ?Social History  ? ?Tobacco Use  ?Smoking Status Not on file  ?Smokeless Tobacco Not on file  ?  ?Social History  ? ?Substance and Sexual Activity   ?Alcohol Use None  ? ? ? ?Allergies  ?Allergen Reactions  ? Pollen Extract Other (See Comments)  ?  Sneezing   ? ? ?Current Facility-Administered Medications  ?Medication Dose Route Frequency Provider Last Rate Last Admin  ? 0.9 %  sodium chloride infusion   Intra-arterial PRN Cecilie Lowers T, MD      ? 0.9 %  sodium chloride infusion   Intravenous PRN Greta Doom, MD 10 mL/hr at 12/31/21 0900 Infusion Verify at 12/31/21 0900  ? acetaminophen (TYLENOL) tablet 650 mg  650 mg Oral Q4H PRN Luanne Bras, MD      ? Or  ? acetaminophen (TYLENOL) 160 MG/5ML solution 650 mg  650 mg Per Tube Q4H PRN Luanne Bras, MD   650 mg at 12/31/21 0451  ? Or  ? acetaminophen (TYLENOL) suppository 650 mg  650 mg Rectal Q4H PRN Deveshwar, Sanjeev, MD      ? albuterol (PROVENTIL) (2.5 MG/3ML) 0.083% nebulizer solution 2.5 mg  2.5 mg Nebulization Q4H PRN Ogan, Kerry Kass, MD      ? atorvastatin (LIPITOR) tablet 40 mg  40 mg Per Tube Daily Jose Persia, MD   40 mg at 12/31/21 0913  ? cefTRIAXone (ROCEPHIN) 2 g in sodium chloride 0.9 % 100 mL IVPB  2 g Intravenous Q12H Mignon Pine, DO   Stopped at 12/31/21 4401  ? chlorhexidine gluconate (MEDLINE KIT) (PERIDEX) 0.12 % solution 15 mL  15 mL Mouth Rinse BID Jennelle Human B, NP   15 mL at 12/31/21 0732  ? Chlorhexidine Gluconate Cloth 2 % PADS 6 each  6 each Topical Daily Donnetta Simpers, MD   6 each at 12/30/21 2144  ? cloNIDine (CATAPRES) tablet 0.1 mg  0.1 mg Per Tube TID Jose Persia, MD   0.1 mg at 12/31/21 0913  ? docusate (COLACE) 50 MG/5ML liquid 100 mg  100 mg Per Tube BID Jennelle Human B, NP   100 mg at 12/31/21 0913  ? feeding supplement (PROSource TF) liquid 45 mL  45 mL Per Tube BID Jose Persia, MD   45 mL at 12/31/21 0913  ? feeding supplement (VITAL 1.5 CAL) liquid 1,000 mL  1,000 mL Per Tube Continuous Byrum, Rose Fillers, MD      ? fentaNYL (SUBLIMAZE) injection 25 mcg  25 mcg Intravenous PRN Rudean Haskell A, MD   25 mcg at  12/31/21 0505  ? fentaNYL (SUBLIMAZE) injection 50-200 mcg  50-200 mcg Intravenous Q30 min PRN Jennelle Human B, NP   100 mcg at 12/31/21 0819  ? HYDROcodone-acetaminophen (NORCO/VICODIN) 5-325 MG per tablet 1-2 tablet  1-2 tablet Oral Q6H PRN Jose Persia, MD   2 tablet at 12/30/21 1803  ? insulin aspart (novoLOG) injection 0-15 Units  0-15 Units  Subcutaneous Q4H Jacky Kindle, MD   2 Units at 12/31/21 0417  ? MEDLINE mouth rinse  15 mL Mouth Rinse 10 times per day Jennelle Human B, NP   15 mL at 12/31/21 1140  ? pantoprazole (PROTONIX) injection 40 mg  40 mg Intravenous Q12H Jennelle Human B, NP   40 mg at 12/31/21 0913  ? polyethylene glycol (MIRALAX / GLYCOLAX) packet 17 g  17 g Per Tube Daily Jose Persia, MD   17 g at 12/31/21 0929  ? propofol (DIPRIVAN) 1000 MG/100ML infusion  0-50 mcg/kg/min Intravenous Continuous Jose Persia, MD 18.11 mL/hr at 12/31/21 1140 30 mcg/kg/min at 12/31/21 1140  ? senna-docusate (Senokot-S) tablet 1 tablet  1 tablet Oral QHS PRN Donnetta Simpers, MD      ? sodium chloride flush (NS) 0.9 % injection 3 mL  3 mL Intravenous Once Pattricia Boss, MD      ? vancomycin (VANCOREADY) IVPB 1250 mg/250 mL  1,250 mg Intravenous Q12H Bertis Ruddy, RPH 166.7 mL/hr at 12/31/21 1100 Infusion Verify at 12/31/21 1100  ? ? ?Medications Prior to Admission  ?Medication Sig Dispense Refill Last Dose  ? aspirin EC 81 MG tablet Take 81 mg by mouth daily. Swallow whole.   unk  ? benzonatate (TESSALON) 100 MG capsule Take 100 mg by mouth 3 (three) times daily as needed for cough.   unk  ? doxycycline (VIBRAMYCIN) 100 MG capsule Take 100 mg by mouth 2 (two) times daily.   unk  ? lisinopril (ZESTRIL) 5 MG tablet Take 5 mg by mouth daily.   unk  ? loratadine (CLARITIN) 10 MG tablet Take 10 mg by mouth daily.     ? Omega-3 1000 MG CAPS Take 1,000 mg by mouth daily.   unk  ? pantoprazole (PROTONIX) 20 MG tablet Take 20 mg by mouth daily.   unk  ? simvastatin (ZOCOR) 40 MG tablet Take 40 mg by  mouth at bedtime.   unk  ? ? ?History reviewed. No pertinent family history. ? ? ?Review of Systems:  ? ?ROS ? ?Pertinent items are noted in HPI.  Patient fully sedated unable to provide details. ? ?  Cardiac Rev

## 2021-12-31 NOTE — Progress Notes (Signed)
RT called to assess patient for increased WOB and desaturation as patient was starting to be bathed. FIO2 increased to 100%. ETT advanced to 26 cm post xray. Albuterol given for expiratory wheezing. Rate increased to 20 per CCM at bedside.  ?

## 2022-01-01 ENCOUNTER — Inpatient Hospital Stay (HOSPITAL_COMMUNITY): Payer: Managed Care, Other (non HMO)

## 2022-01-01 DIAGNOSIS — I63511 Cerebral infarction due to unspecified occlusion or stenosis of right middle cerebral artery: Secondary | ICD-10-CM | POA: Diagnosis not present

## 2022-01-01 DIAGNOSIS — J9601 Acute respiratory failure with hypoxia: Secondary | ICD-10-CM | POA: Diagnosis not present

## 2022-01-01 DIAGNOSIS — N179 Acute kidney failure, unspecified: Secondary | ICD-10-CM | POA: Diagnosis not present

## 2022-01-01 DIAGNOSIS — D500 Iron deficiency anemia secondary to blood loss (chronic): Secondary | ICD-10-CM | POA: Diagnosis not present

## 2022-01-01 DIAGNOSIS — I3139 Other pericardial effusion (noninflammatory): Secondary | ICD-10-CM

## 2022-01-01 DIAGNOSIS — B9561 Methicillin susceptible Staphylococcus aureus infection as the cause of diseases classified elsewhere: Secondary | ICD-10-CM

## 2022-01-01 LAB — COMPREHENSIVE METABOLIC PANEL
ALT: 35 U/L (ref 0–44)
AST: 40 U/L (ref 15–41)
Albumin: 1.8 g/dL — ABNORMAL LOW (ref 3.5–5.0)
Alkaline Phosphatase: 57 U/L (ref 38–126)
Anion gap: 5 (ref 5–15)
BUN: 29 mg/dL — ABNORMAL HIGH (ref 6–20)
CO2: 23 mmol/L (ref 22–32)
Calcium: 7.7 mg/dL — ABNORMAL LOW (ref 8.9–10.3)
Chloride: 117 mmol/L — ABNORMAL HIGH (ref 98–111)
Creatinine, Ser: 1.4 mg/dL — ABNORMAL HIGH (ref 0.61–1.24)
GFR, Estimated: 58 mL/min — ABNORMAL LOW (ref >60)
Glucose, Bld: 138 mg/dL — ABNORMAL HIGH (ref 70–99)
Potassium: 4.2 mmol/L (ref 3.5–5.1)
Sodium: 145 mmol/L (ref 135–145)
Total Bilirubin: 0.5 mg/dL (ref 0.3–1.2)
Total Protein: 5.2 g/dL — ABNORMAL LOW (ref 6.5–8.1)

## 2022-01-01 LAB — CBC WITH DIFFERENTIAL/PLATELET
Abs Immature Granulocytes: 0.08 10*3/uL — ABNORMAL HIGH (ref 0.00–0.07)
Basophils Absolute: 0.1 10*3/uL (ref 0.0–0.1)
Basophils Relative: 1 %
Eosinophils Absolute: 0.2 10*3/uL (ref 0.0–0.5)
Eosinophils Relative: 2 %
HCT: 30 % — ABNORMAL LOW (ref 39.0–52.0)
Hemoglobin: 9.2 g/dL — ABNORMAL LOW (ref 13.0–17.0)
Immature Granulocytes: 1 %
Lymphocytes Relative: 9 %
Lymphs Abs: 1 10*3/uL (ref 0.7–4.0)
MCH: 30.1 pg (ref 26.0–34.0)
MCHC: 30.7 g/dL (ref 30.0–36.0)
MCV: 98 fL (ref 80.0–100.0)
Monocytes Absolute: 0.5 10*3/uL (ref 0.1–1.0)
Monocytes Relative: 5 %
Neutro Abs: 9.3 10*3/uL — ABNORMAL HIGH (ref 1.7–7.7)
Neutrophils Relative %: 82 %
Platelets: 448 10*3/uL — ABNORMAL HIGH (ref 150–400)
RBC: 3.06 MIL/uL — ABNORMAL LOW (ref 4.22–5.81)
RDW: 15.8 % — ABNORMAL HIGH (ref 11.5–15.5)
WBC: 11.2 10*3/uL — ABNORMAL HIGH (ref 4.0–10.5)
nRBC: 0.4 % — ABNORMAL HIGH (ref 0.0–0.2)

## 2022-01-01 LAB — VANCOMYCIN, PEAK: Vancomycin Pk: 33 ug/mL (ref 30–40)

## 2022-01-01 LAB — GLUCOSE, CAPILLARY
Glucose-Capillary: 130 mg/dL — ABNORMAL HIGH (ref 70–99)
Glucose-Capillary: 88 mg/dL (ref 70–99)
Glucose-Capillary: 118 mg/dL — ABNORMAL HIGH (ref 70–99)
Glucose-Capillary: 124 mg/dL — ABNORMAL HIGH (ref 70–99)
Glucose-Capillary: 119 mg/dL — ABNORMAL HIGH (ref 70–99)
Glucose-Capillary: 116 mg/dL — ABNORMAL HIGH (ref 70–99)

## 2022-01-01 LAB — URINE CULTURE: Culture: >=100000 — AB

## 2022-01-01 LAB — TRIGLYCERIDES: Triglycerides: 142 mg/dL (ref <150)

## 2022-01-01 IMAGING — DX DG ABD PORTABLE 1V
1 series · 1 of 1 positions shown · non-contrast
Comparison: [DATE]

CLINICAL DATA: Feeding tube placement in a 60-year-old male.

EXAM:
PORTABLE ABDOMEN - 1 VIEW

[abdomen kub]
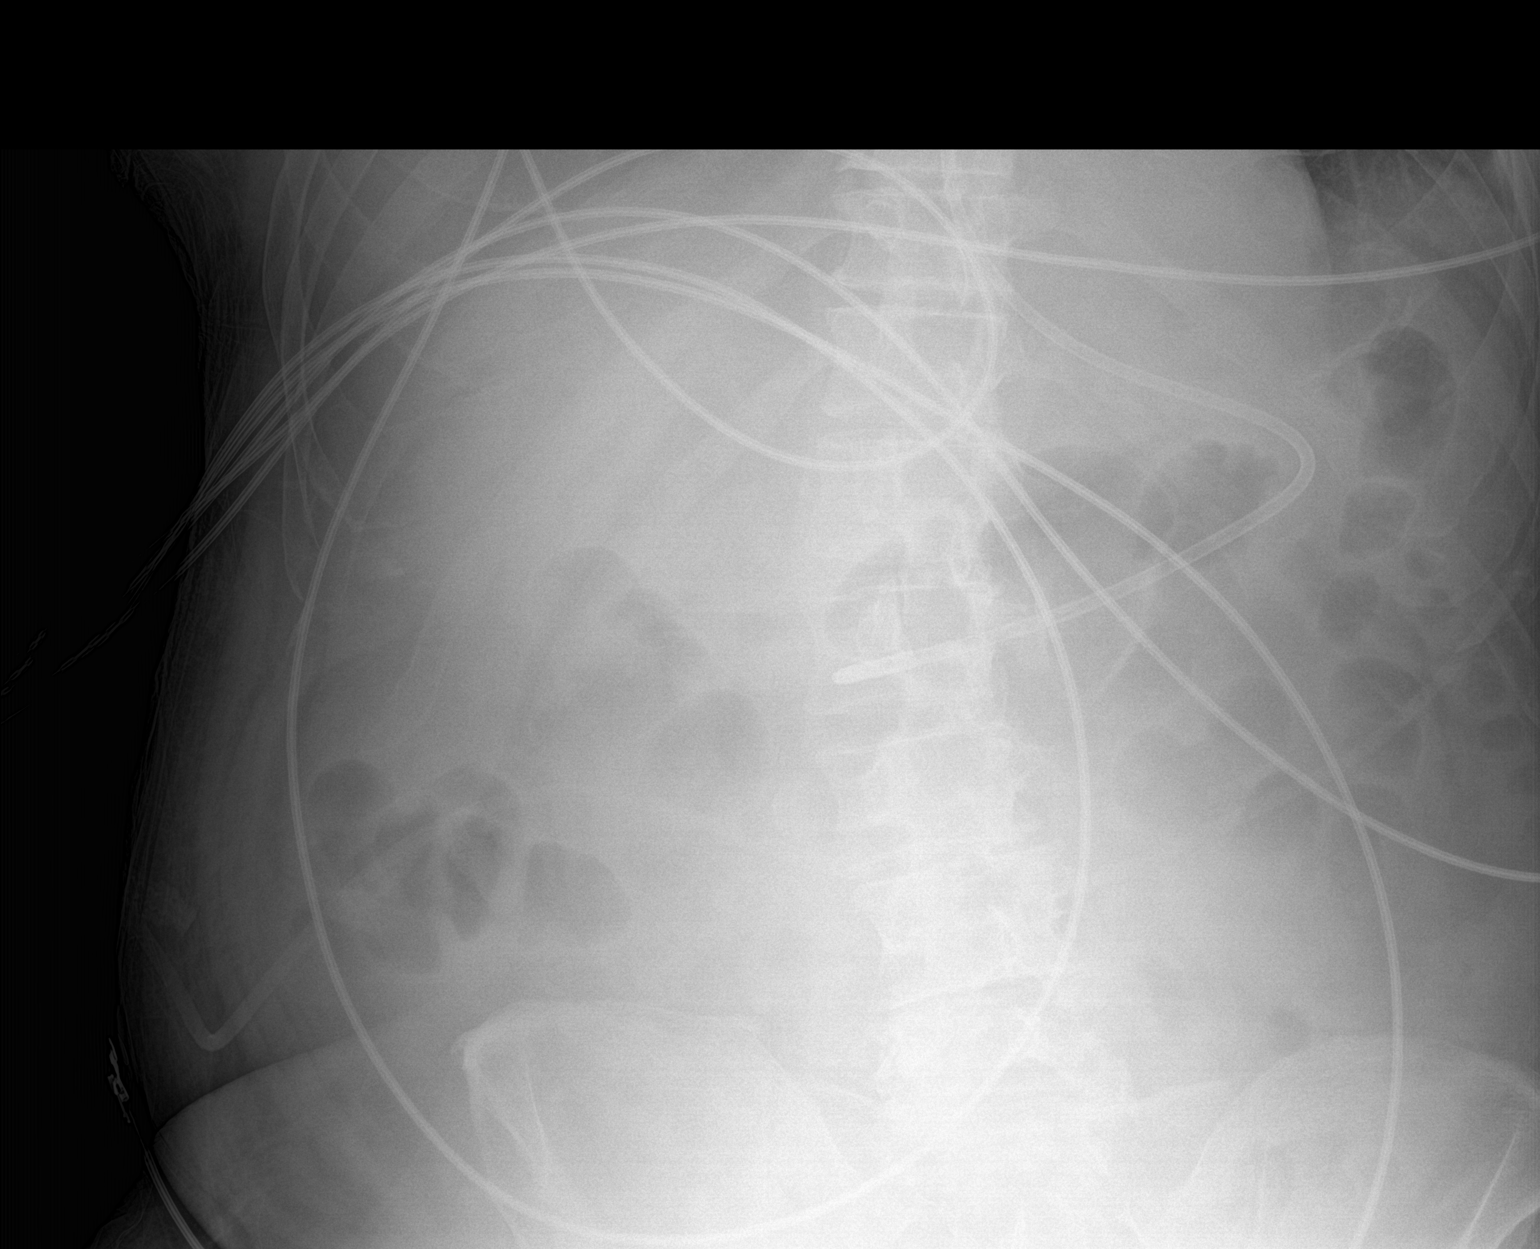

[1 of 1 positions shown; findings below may reference images not displayed]

FINDINGS: EKG leads project over the patient's abdomen.

The previous gastric tube has been removed replaced with a feeding
tube, the tip of the feeding tube is in the distal stomach and or
pyloric region.

Bowel gas pattern as visualized is unremarkable.

On limited assessment no acute skeletal process.
IMPRESSION: Gastric true be removed, feeding tube in the distal stomach. Tip
directed towards the area of the pylorus.

## 2022-01-01 IMAGING — DX DG CHEST 1V PORT
1 series · 1 of 1 positions shown · non-contrast
Comparison: [DATE]

CLINICAL DATA: 60-year-old male with respiratory failure.

EXAM:
PORTABLE CHEST - 1 VIEW

[chest]
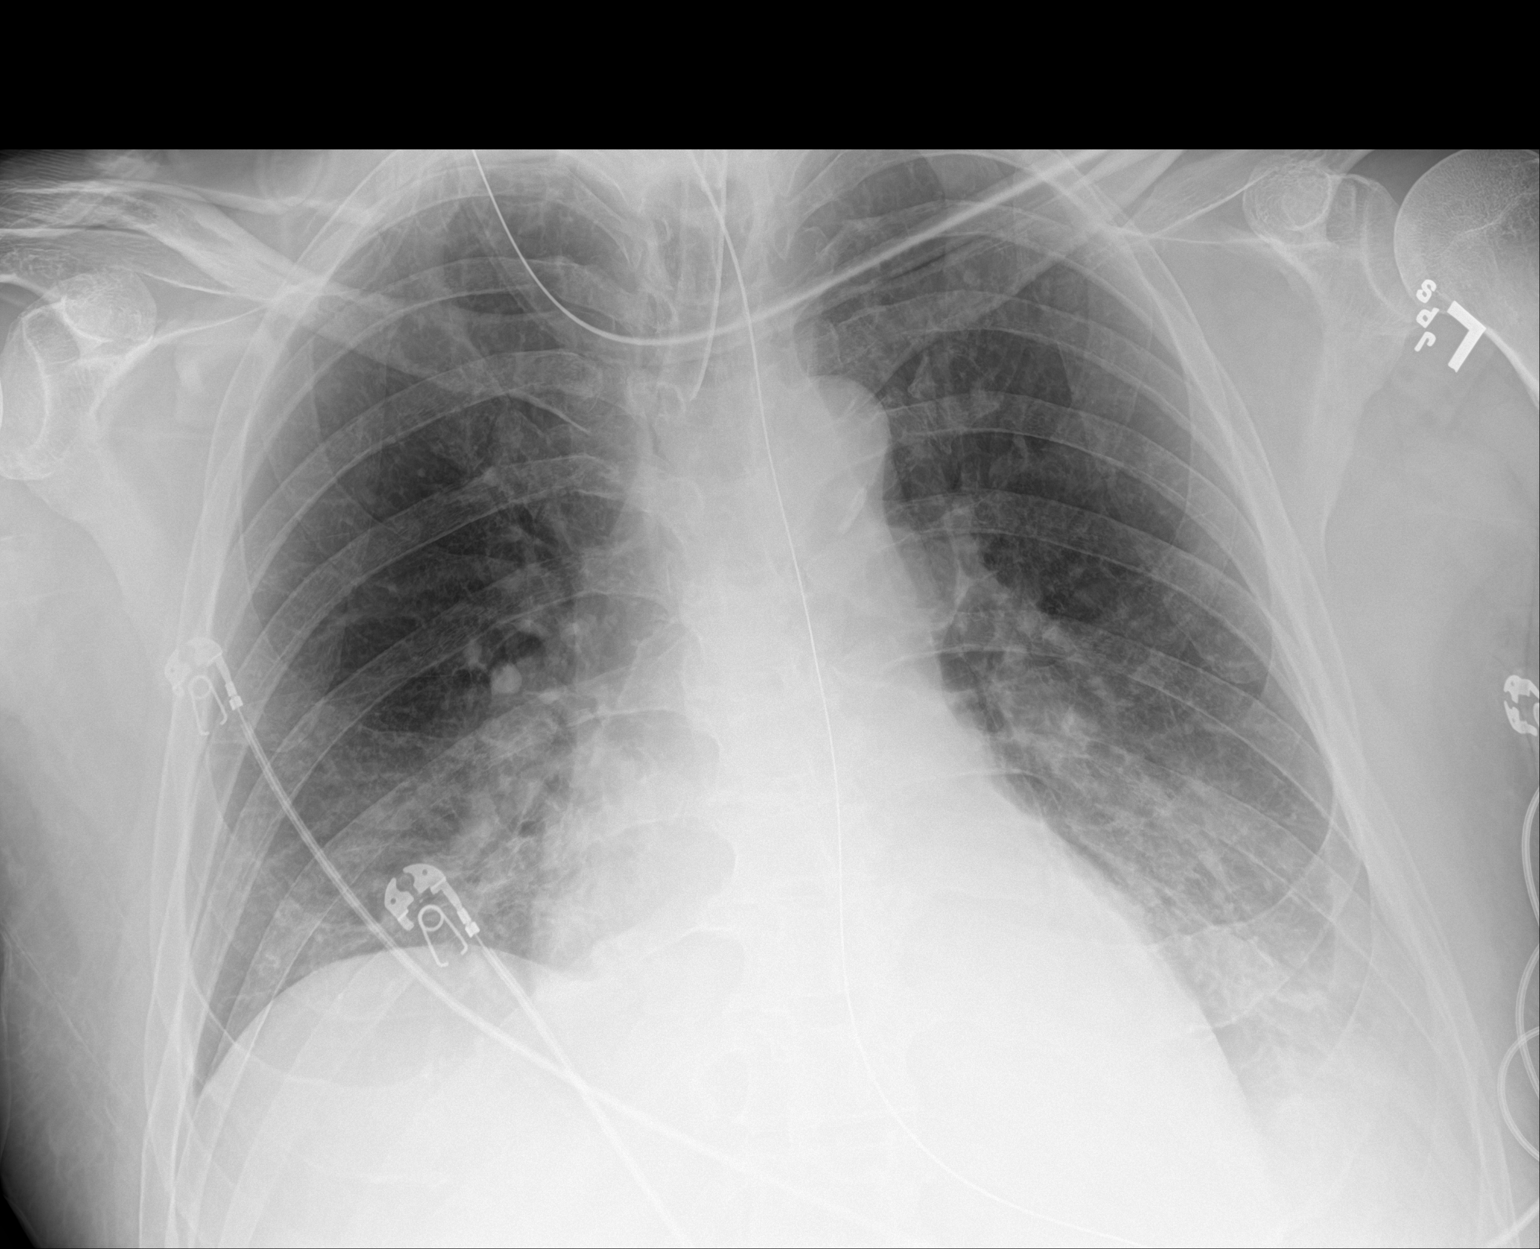

[1 of 1 positions shown; findings below may reference images not displayed]

FINDINGS: The mediastinal contours are within normal limits. Unchanged
cardiomegaly. Atherosclerotic calcification of the aortic arch.
Unchanged position of indwelling endotracheal tube with the tip in
the midthoracic trachea. Gastric decompression tube terminates off
the inferior aspect of this image. Interval improved aeration of the
lungs bilaterally with persistent bibasilar subsegmental
atelectasis. No evidence of significant pleural effusion. No
pneumothorax. No acute osseous abnormality.
IMPRESSION: 1. Interval improved aeration of the lungs bilaterally with
persistent bibasilar subsegmental atelectasis.
2. Endotracheal tube in unchanged, appropriate position.

## 2022-01-01 IMAGING — DX DG CHEST 1V PORT
1 series · 1 of 1 positions shown · non-contrast
Comparison: None.

CLINICAL DATA: Intubation

EXAM:
PORTABLE CHEST 1 VIEW

[chest]
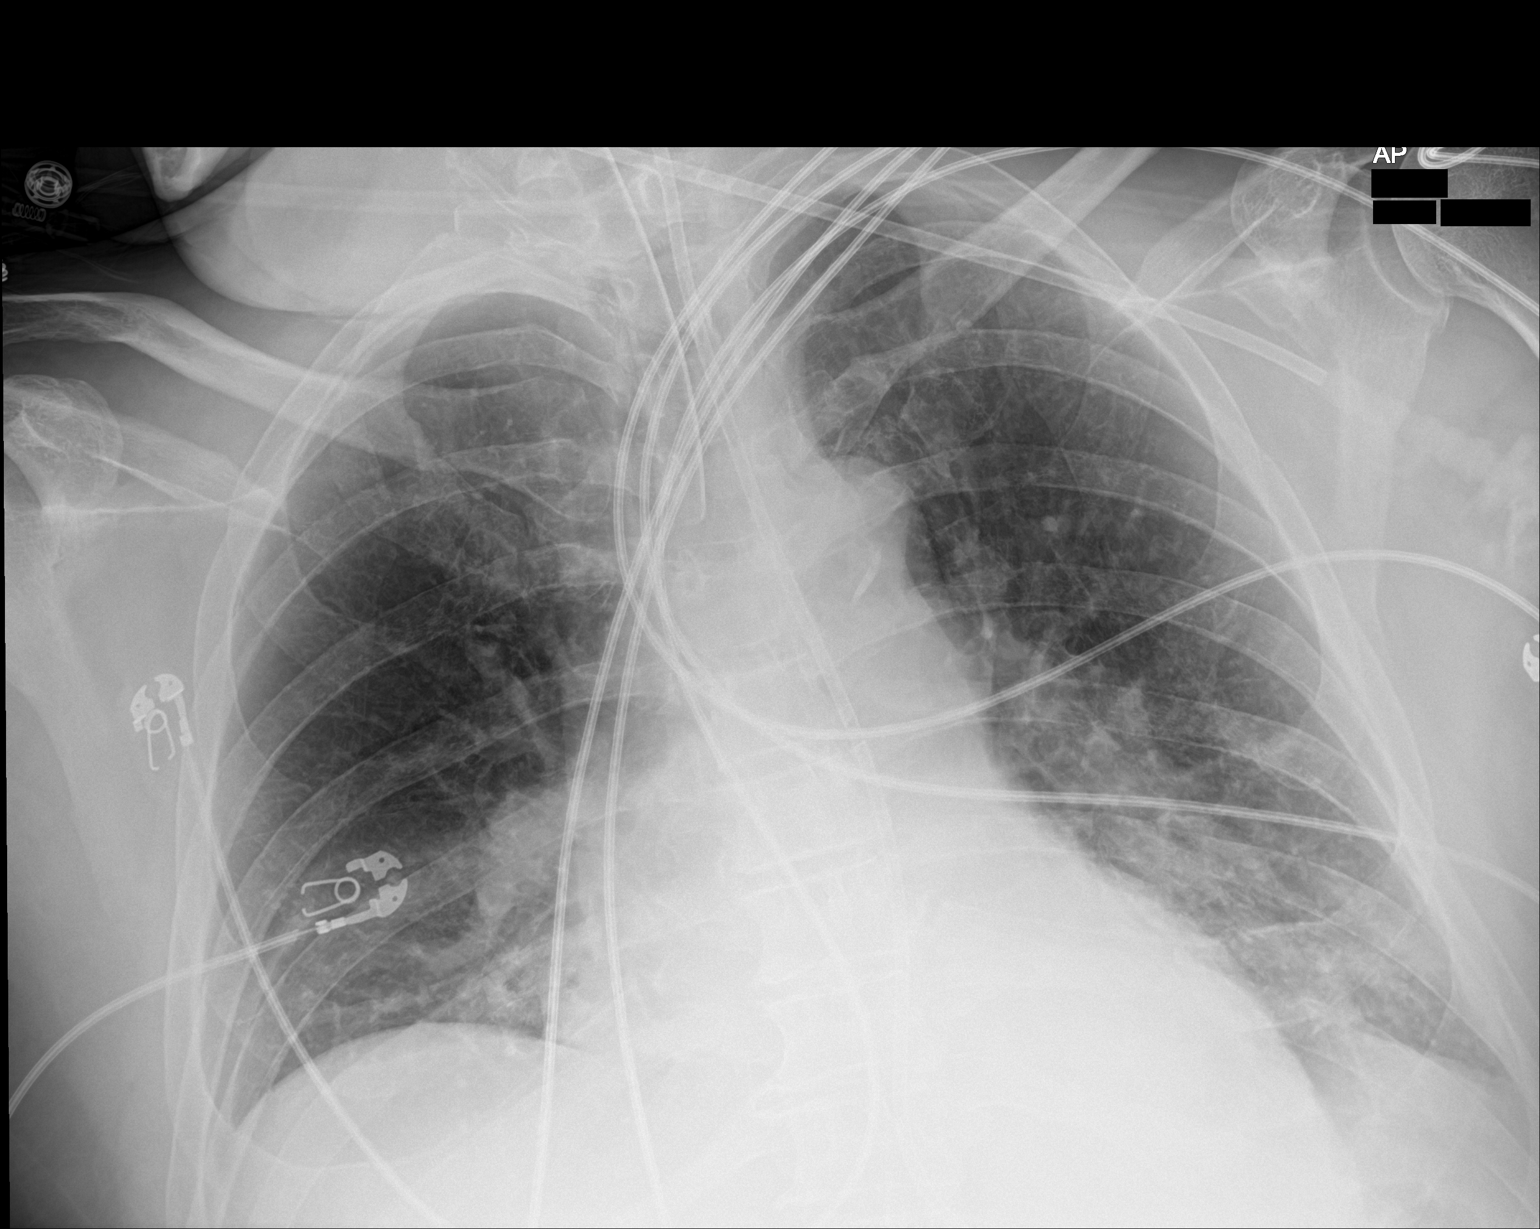

[1 of 1 positions shown; findings below may reference images not displayed]

FINDINGS: Endotracheal tube tip is at the level of the clavicular heads.
Esophageal catheter tip is below the field of view. There are
bilateral parahilar opacities, unchanged.
IMPRESSION: Endotracheal tube tip at the level of the clavicular heads.

## 2022-01-01 MED ORDER — QUETIAPINE FUMARATE 25 MG PO TABS
50.0000 mg | ORAL_TABLET | Freq: Two times a day (BID) | ORAL | Status: DC
  Administered 2022-01-01 – 2022-01-06 (×12): 50 mg via NASOGASTRIC
  Filled 2022-01-01 (×12): qty 2

## 2022-01-01 MED ORDER — CLONAZEPAM 0.1 MG/ML ORAL SUSPENSION
0.5000 mg | Freq: Two times a day (BID) | ORAL | Status: DC
  Filled 2022-01-01: qty 5

## 2022-01-01 MED ORDER — CLONAZEPAM 0.25 MG PO TBDP
0.5000 mg | ORAL_TABLET | Freq: Two times a day (BID) | ORAL | Status: DC
  Administered 2022-01-01 – 2022-01-06 (×12): 0.5 mg
  Filled 2022-01-01 (×12): qty 2

## 2022-01-01 MED ORDER — ETOMIDATE 2 MG/ML IV SOLN
INTRAVENOUS | Status: AC
  Administered 2022-01-01: 20 mg
  Filled 2022-01-01: qty 20

## 2022-01-01 MED ORDER — SODIUM CHLORIDE 0.9 % IV SOLN
12.0000 g | INTRAVENOUS | Status: DC
  Administered 2022-01-01 – 2022-01-15 (×15): 12 g via INTRAVENOUS
  Filled 2022-01-01 (×4): qty 48
  Filled 2022-01-01: qty 40
  Filled 2022-01-01 (×4): qty 48
  Filled 2022-01-01: qty 32
  Filled 2022-01-01 (×2): qty 48
  Filled 2022-01-01: qty 8
  Filled 2022-01-01 (×3): qty 48

## 2022-01-01 MED ORDER — ROCURONIUM BROMIDE 10 MG/ML (PF) SYRINGE
PREFILLED_SYRINGE | INTRAVENOUS | Status: AC
  Administered 2022-01-01: 100 mg
  Filled 2022-01-01: qty 10

## 2022-01-01 MED ORDER — MIDAZOLAM HCL 2 MG/2ML IJ SOLN
INTRAMUSCULAR | Status: AC
  Administered 2022-01-01: 2 mg
  Filled 2022-01-01: qty 2

## 2022-01-01 NOTE — Procedures (Signed)
Intubation Procedure Note ? ?Vincent Ferrell  ?[id number]  ?Jul 29, 1961 ? ?Date:01/01/22  ?Time:7:07 PM  ? ?Provider Performing:Yechezkel Fertig Wells Guiles  ? ? ?Procedure: Intubation ([id number]) ? ?Indication(s) ?Respiratory Failure ? ?Consent ?Unable to obtain consent due to emergent nature of procedure. ? ? ?Anesthesia ?Etomidate, Versed, and Rocuronium ? ? ?Time Out ?Verified patient identification, verified procedure, site/side was marked, verified correct patient position, special equipment/implants available, medications/allergies/relevant history reviewed, required imaging and test results available. ? ? ?Sterile Technique ?Usual hand hygeine, masks, and gloves were used ? ? ?Procedure Description ?Patient positioned in bed supine.  Sedation given as noted above.  Patient was intubated with endotracheal tube using bougie for ETT exchange.  Number of attempts was 1.  Colorimetric CO2 detector was consistent with tracheal placement. After first intubation cuff was blown likely from contact with teeth during tube exchange. This new tube was then again changed over bougie. Placement confirmed with ETCO2 detector, ausculatation.  ? ? ?Complications/Tolerance ?None; patient tolerated the procedure well. ?Chest X-ray is ordered to verify placement. ? ? ?EBL ?Minimal ? ? ?Specimen(s) ?None ? ? ?Georgann Housekeeper, AGACNP-BC ?[facility] ? ?See Amion for personal pager ?PCCM on call pager [phone] until 7pm. ?Please call Elink 7p-7a. [phone] ? ?01/01/2022 7:08 PM ? ? ? ? ? ?

## 2022-01-01 NOTE — Consult Note (Addendum)
?Cardiology Consultation:  ? ?Patient ID: Vincent Ferrell ?MRN: [id number]; DOB: [birth year] ? ?Admit date: 12/25/2021 ?Date of Consult: 01/01/2022 ? ?PCP:  Houston Siren., MD ?  ?[facility] Providers ?Cardiologist:  None      ? ? ?Patient Profile:  ? ?Davaris Ferrell is a 61 y.o. male with a hx of HTN, HLD, GERD, prediabetes, who is currently admitted for acute ischemic R MCA stroke, cardiology is consulted on 01/01/22 for evaluation of pericardial effusion at the request of Dr. Lamonte Sakai.  ? ? ?History of Present Illness:  ? ?[name] with above PMH scented to the ER on 12/25/2021 with code stroke with left-sided weakness, left facial droop, right gaze deviation, and slurred speech.   ? ?EKG at ER on 12/25/21 revealed sinus tachycardia 148bpm with diffuse ST elevation of multiple leads.  Patient had no chest pain related symptoms. Hs trop 337>315.   STEMI code was called and canceled as patient did not meet criteria upon review with on-call cardiologist.  Pericarditis was suspected.  Echocardiogram from 12/26/21 revealed EF 55 to 60%, no RWMA, indeterminate diastolic parameter, normal RV, circumferential pericardial effusion.  No significant valvular disease. ? ?Patient was evaluated by neurology, imaging revealed acute ischemic right MCA stroke and right MCA M2 occlusion.  Patient underwent urgent mechanical thrombectomy by neuro IR on 12/25/2021, with complete revascularization.  No tPA was given due to subacute embolic appearing infarct and suspected endocarditis.  He was subsequently admitted under neurology service.  Subsequent imaging confirmed acute to moderate right MCA and punctate left cerebellum infarcts and subacute small right occipital and left MCA infarcts.  Stroke etiology was felt embolic from endocarditis.  ? ?He was also recently treated for RLL pneumonia by PCP 2 days prior to admission with doxycycline. Initial diagnostic work up and clinical presentation had concern for sepsis with  infective endocarditis, including leukocytosis, Janeway lesion, possible pericarditis, and multifocal pneumonia on CT torso.  Critical care service was consulted and he was given antibiotic.  He shortly developed acute respiratory failure required intubation and mechanical ventilation on 12/25/2021.  Blood culture has remained NTD. Urine culture >100,000 staph aureus. MRI pelvis from 12/29/21 showed prostatitis with a 3.7 x 2.8 cm rim enhancing fluid collection along the left side of the prostate gland most consistent with an intraprostatic abscess.  TEE from 12/28/21 by Dr Gasper Sells revealed mitral valve vegetation involving A2-P2 and A3-P3.  Largest 1 cm x 0.5 cm.  Trivial MR.  EF 55 to 60%, normal LV function.  Normal RV.  No LA/LAA thrombus detected.  Small pericardial effusion was present near the right atrium. 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  Patient was seen by urology and underwent transurethral resection/unroofing of prostate/prostatic abscess on 12/29/2021.  ? ?Hospital course is complicated by acute lower GI blood loss requiring multiple PRBC transfusion.  GI has been following since 12/25/2021, EGD from 4/18 showed erosive gastropathy, hiatal hernia, multiple gastric polyps.  Colonoscopy revealed no evidence of ischemic colitis, 13 mm polyp in the rectum, and nonbleeding internal hemorrhoids.  No clear source of GI bleed was found from scope. Hgb has been stable around 8-9 without further melenic stool. ? ?Hospital course is also complicated by acute kidney injury secondary to ATN from septic shock.  Renal index has slowly recovered with Cr 1.4 and GFR 58 today.  ? ?He remains on mechanical ventilation for acute hypoxic respiratory failure, and continue to require sedation for agitated encephalopathy in  the ICU.  Caudiology consult is requested today due to repeat echocardiogram 12/31/2021 revealing worsening pericardial effusion.   ?History obtaining is limited to chart review, as patient is currently intubated and sedated.  ? ? ? ?Past Medical History:  ?Diagnosis Date  ? Chest pain in adult 10/24/2017  ? Elevated BP without diagnosis of hypertension 10/24/2017  ? Impaired fasting glucose 10/24/2017  ? Mixed hyperlipidemia 10/24/2017  ? Rising PSA level 10/24/2017  ? ? ?Past Surgical History:  ?Procedure Laterality Date  ? COLONOSCOPY WITH PROPOFOL N/A 12/28/2021  ? Procedure: COLONOSCOPY WITH PROPOFOL;  Surgeon: Thornton Park, MD;  Location: [facility];  Service: Gastroenterology;  Laterality: N/A;  ? COLONOSCOPY WITH PROPOFOL N/A 12/29/2021  ? Procedure: COLONOSCOPY WITH PROPOFOL;  Surgeon: Thornton Park, MD;  Location: [facility];  Service: Gastroenterology;  Laterality: N/A;  ? ESOPHAGOGASTRODUODENOSCOPY (EGD) WITH PROPOFOL N/A 12/29/2021  ? Procedure: ESOPHAGOGASTRODUODENOSCOPY (EGD) WITH PROPOFOL;  Surgeon: Thornton Park, MD;  Location: [facility];  Service: Gastroenterology;  Laterality: N/A;  ? IR CT HEAD LTD  12/25/2021  ? IR PERCUTANEOUS ART THROMBECTOMY/INFUSION INTRACRANIAL INC DIAG ANGIO  12/25/2021  ? RADIOLOGY WITH ANESTHESIA N/A 12/25/2021  ? Procedure: IR WITH ANESTHESIA;  Surgeon: Radiologist, Medication, MD;  Location: [facility];  Service: Radiology;  Laterality: N/A;  ? TRANSURETHRAL RESECTION OF PROSTATE N/A 12/29/2021  ? Procedure: TRANSURETHRAL RESECTION OF THE PROSTATE (TURP);  Surgeon: Lucas Mallow, MD;  Location: [facility];  Service: Urology;  Laterality: N/A;  ?  ? ?Home Medications:  ?Prior to Admission medications   ?Medication Sig Start Date End Date Taking? Authorizing Provider  ?aspirin EC 81 MG tablet Take 81 mg by mouth daily. Swallow whole.   Yes [provider]  ?benzonatate (TESSALON) 100 MG capsule Take 100 mg by mouth 3 (three) times daily as needed for cough. 12/22/21  Yes [provider]  ?doxycycline (VIBRAMYCIN) 100 MG capsule Take 100 mg by mouth 2 (two) times daily. 12/22/21   Yes [provider]  ?lisinopril (ZESTRIL) 5 MG tablet Take 5 mg by mouth daily. 12/03/21  Yes [provider]  ?loratadine (CLARITIN) 10 MG tablet Take 10 mg by mouth daily.   Yes [provider]  ?Omega-3 1000 MG CAPS Take 1,000 mg by mouth daily.   Yes [provider]  ?pantoprazole (PROTONIX) 20 MG tablet Take 20 mg by mouth daily. 12/03/21  Yes [provider]  ?simvastatin (ZOCOR) 40 MG tablet Take 40 mg by mouth at bedtime. 12/03/21  Yes [provider]  ? ? ?Inpatient Medications: ?Scheduled Meds: ? atorvastatin  40 mg Per Tube Daily  ? chlorhexidine gluconate (MEDLINE KIT)  15 mL Mouth Rinse BID  ? Chlorhexidine Gluconate Cloth  6 each Topical Daily  ? clonazepam  0.5 mg Per Tube BID  ? cloNIDine  0.1 mg Per Tube TID  ? docusate  100 mg Per Tube BID  ? feeding supplement (PROSource TF)  45 mL Per Tube BID  ? insulin aspart  0-15 Units Subcutaneous Q4H  ? mouth rinse  15 mL Mouth Rinse 10 times per day  ? pantoprazole (PROTONIX) IV  40 mg Intravenous Q12H  ? polyethylene glycol  17 g Per Tube Daily  ? QUEtiapine  50 mg Per NG tube BID  ? sodium chloride flush  3 mL Intravenous Once  ? ?Continuous Infusions: ? sodium chloride    ? sodium chloride 10 mL/hr at 12/31/21 0900  ? feeding supplement (VITAL 1.5 CAL) 1,000 mL (01/01/22 0600)  ?  nafcillin (NAFCIL) continuous infusion 12 g (01/01/22 1219)  ? propofol (DIPRIVAN) infusion 50 mcg/kg/min (01/01/22 1224)  ? ?PRN Meds: ?Place/Maintain arterial line **AND** sodium chloride, sodium chloride, acetaminophen **OR** acetaminophen (TYLENOL) oral liquid 160 mg/5 mL **OR** acetaminophen, albuterol, fentaNYL (SUBLIMAZE) injection, fentaNYL (SUBLIMAZE) injection, HYDROcodone-acetaminophen, senna-docusate ? ?Allergies:    ?Allergies  ?Allergen Reactions  ? Pollen Extract Other (See Comments)  ?  Sneezing   ? ? ?Social History:   ?Social History  ? ?Socioeconomic History  ? Marital status: Unknown  ?  Spouse name: Not on  file  ? Number of children: Not on file  ? Years of education: Not on file  ? Highest education level: Not on file  ?Occupational History  ? Not on file  ?Tobacco Use  ? Smoking status: Not on file  ? Smokeless

## 2022-01-01 NOTE — Progress Notes (Signed)
SLP Cancellation Note ? ?Patient Details ?Name: Vincent Ferrell ?MRN: [id number] ?DOB: [birth year] ? ? ?Cancelled treatment:       Reason Eval/Treat Not Completed: Patient not medically ready ? ? ?Channie Bostick, Katherene Ponto ?01/01/2022, 8:15 AM ?

## 2022-01-01 NOTE — Procedures (Signed)
Cortrak  Person Inserting Tube:  Maxten Shuler, RD Tube Type:  Cortrak - 43 inches Tube Size:  10 Tube Location:  Left nare Initial Placement:  Stomach Secured by: Bridle Technique Used to Measure Tube Placement:  Marking at nare/corner of mouth Cortrak Secured At:  77 cm  Cortrak Tube Team Note:  Consult received to place a Cortrak feeding tube.   X-ray is required, abdominal x-ray has been ordered by the Cortrak team. Please confirm tube placement before using the Cortrak tube.   If the tube becomes dislodged please keep the tube and contact the Cortrak team at [email or web address] ([email or web address]) for replacement.  If after hours and replacement cannot be delayed, place a NG tube and confirm placement with an abdominal x-ray.   Keyvin Rison MS, RDN, LDN, CNSC Registered Dietitian III Clinical Nutrition RD Pager and On-Call Pager Number Located in Amion    

## 2022-01-01 NOTE — Progress Notes (Signed)
STROKE TEAM PROGRESS NOTE  ? ?SUBJECTIVE (INTERVAL HISTORY) ?His sister and girlfriend are at the bedside.  Patient still intubated, on propofol.  Patient afebrile this am and leukocytosis much improved.  ID on board, considered disseminated MSSA infection, will narrow Abx to nafcillin and consider pericardial effusion drainage.  ? ?OBJECTIVE ?Temp:  [98.2 ?F (36.8 ?C)-99.1 ?F (37.3 ?C)] 98.7 ?F (37.1 ?C) (04/21 1200) ?Pulse Rate:  [85-117] 98 (04/21 1200) ?Cardiac Rhythm: Sinus tachycardia (04/21 1200) ?Resp:  [19-31] 23 (04/21 1200) ?BP: (101-191)/(66-101) 130/79 (04/21 1200) ?SpO2:  [92 %-100 %] 97 % (04/21 1219) ?FiO2 (%):  [30 %-50 %] 30 % (04/21 1219) ? ?Recent Labs  ?Lab 12/31/21 ?1937 12/31/21 ?2344 01/01/22 ?[id number] 01/01/22 ?[id number] 01/01/22 ?1117  ?GLUCAP 122* 147* 130* 88 118*  ? ?Recent Labs  ?Lab 12/28/21 ?0025 12/28/21 ?1626 12/29/21 ?0617 12/30/21 ?0301 12/30/21 ?[id number] 12/31/21 ?[id number] 12/31/21 ?[id number] 12/31/21 ?[id number] 12/31/21 ?1437 01/01/22 ?0309  ?NA 149*   < > 154* 148*   < > 148* 147* 146* 146* 145  ?K 4.2   < > 3.6 4.0   < > 4.1 4.3 4.3 3.9 4.2  ?CL 124*  --  125* 118*  --   --  114*  --   --  117*  ?CO2 20*  --  21* 22  --   --  22  --   --  23  ?GLUCOSE 143*  --  131* 130*  --   --  117*  --   --  138*  ?BUN 46*  --  36* 23*  --   --  28*  --   --  29*  ?CREATININE 1.87*  --  1.81* 1.52*  --   --  1.51*  --   --  1.40*  ?CALCIUM 7.4*  --  7.9* 7.5*  --   --  7.8*  --   --  7.7*  ? < > = values in this interval not displayed.  ? ?Recent Labs  ?Lab 12/27/21 ?[id number] 01/01/22 ?0309  ?AST 33 40  ?ALT 24 35  ?ALKPHOS 47 57  ?BILITOT 0.6 0.5  ?PROT 4.2* 5.2*  ?ALBUMIN 1.6* 1.8*  ? ?Recent Labs  ?Lab 12/26/21 ?0230 12/27/21 ?0032 12/28/21 ?1023 12/28/21 ?1553 12/29/21 ?0617 12/29/21 ?[id number] 12/30/21 ?0301 12/30/21 ?[id number] 12/31/21 ?[id number] 12/31/21 ?[id number] 12/31/21 ?[id number] 12/31/21 ?1437 12/31/21 ?1453 12/31/21 ?2111 01/01/22 ?0309  ?WBC 22.3*   < > 10.5  --  10.4  --  22.8*  --  19.4*  --   --   --   --   --  11.2*   ?NEUTROABS 17.1*  --   --   --   --   --   --   --   --   --   --   --   --   --  9.3*  ?HGB 9.6*   < > 6.8*   < > 8.4*   < > 8.4*   < > 9.6*   < > 8.5* 8.8* 9.3* 9.6* 9.2*  ?HCT 27.2*   < > 22.4*   < > 26.3*   < > 26.3*   < > 31.3*   < > 27.2* 26.0* 31.5* 32.1* 30.0*  ?MCV 85.5   < > 94.5  --  93.3  --  96.3  --  97.2  --   --   --   --   --  98.0  ?PLT 202   < > 334  --  352  --  379  --  392  --   --   --   --   --  448*  ? < > = values in this interval not displayed.  ? ?No results for input(s): CKTOTAL, CKMB, CKMBINDEX, TROPONINI in the last 168 hours. ?No results for input(s): LABPROT, INR in the last 72 hours. ? ?No results for input(s): COLORURINE, LABSPEC, PHURINE, GLUCOSEU, HGBUR, BILIRUBINUR, KETONESUR, PROTEINUR, UROBILINOGEN, NITRITE, LEUKOCYTESUR in the last 72 hours. ? ?Invalid input(s): APPERANCEUR ?  ?   ?Component Value Date/Time  ? CHOL 89 12/26/2021 0230  ? TRIG 142 01/01/2022 0309  ? HDL <10 (L) 12/26/2021 0230  ? CHOLHDL NOT CALCULATED 12/26/2021 0230  ? VLDL 43 (H) 12/26/2021 0230  ? LDLCALC NOT CALCULATED 12/26/2021 0230  ? ?Lab Results  ?Component Value Date  ? HGBA1C 6.0 (H) 12/25/2021  ? ?   ?Component Value Date/Time  ? LABOPIA NONE DETECTED 12/26/2021 0116  ? COCAINSCRNUR NONE DETECTED 12/26/2021 0116  ? LABBENZ NONE DETECTED 12/26/2021 0116  ? AMPHETMU NONE DETECTED 12/26/2021 0116  ? THCU NONE DETECTED 12/26/2021 0116  ? LABBARB NONE DETECTED 12/26/2021 0116  ?  ?No results for input(s): ETH in the last 168 hours. ? ?I have personally reviewed the radiological images below and agree with the radiology interpretations. ? ?DG Abd 1 View ? ?Result Date: 12/25/2021 ?CLINICAL DATA:  Orogastric tube placement. EXAM: ABDOMEN - 1 VIEW COMPARISON:  None. FINDINGS: An orogastric tube is seen with its distal tip overlying the expected region of the gastric antrum. The bowel gas pattern is normal. No radio-opaque calculi or other significant radiographic abnormality are seen. IMPRESSION:  Orogastric tube positioning, as described above. Electronically Signed   By: Aram Candela M.D.   On: 12/25/2021 19:09  ? ?DG Abd 1 View ? ?Result Date: 12/25/2021 ?CLINICAL DATA:  OG tube placement. EXAM: ABDOMEN - 1 VIEW COMPARISON:  One view chest x-ray 12:45 p.m. FINDINGS: Gaseous distention of the stomach noted. Bowel gas pattern otherwise unremarkable. OG tube is not visualized over the distal esophagus or stomach. IMPRESSION: OG tube is not visualized over the distal esophagus or stomach. Gaseous distention of the stomach. Electronically Signed   By: Marin Roberts M.D.   On: 12/25/2021 19:06  ? ?MR ANGIO HEAD WO CONTRAST ? ?Result Date: 12/26/2021 ?CLINICAL DATA:  Acute infarct. Revascularization of superior right M2 segment. EXAM: MRA HEAD WITHOUT CONTRAST TECHNIQUE: Angiographic images of the Circle of Willis were acquired using MRA technique without intravenous contrast. COMPARISON:  CTA head and neck 12/25/2021.  Revascularization films. FINDINGS: Anterior circulation: The internal carotid arteries are within normal limits from high cervical segments through the ICA termini bilaterally. The A1 and M1 segments are normal. MCA bifurcations are within normal limits. MCA bifurcations are intact. The revascularized superior right M2 segments remain patent. ACA and MCA branch vessels are normal bilaterally. Posterior circulation: The vertebral arteries are codominant. AICA vessels are dominant. The basilar artery is within normal limits. Right posterior cerebral artery originates from basilar tip. Left posterior cerebral artery is of fetal type. A left P1 segment contributes. PCA branch vessels are within normal limits bilaterally. Anatomic variants: None Other: None. IMPRESSION: 1. Normal variant MRA Circle of Willis without evidence for significant proximal stenosis, aneurysm, or branch vessel occlusion. 2. The revascularized superior right M2 segments remain patent. Electronically Signed   By:  Marin Roberts M.D.   On: 12/26/2021 16:54  ? ?MR BRAIN WO CONTRAST ? ?Result Date: 12/26/2021 ?CLINICAL DATA:  Status  post revascularization of right superior M2 occlusion. EXAM: MRI HEAD WITHOUT CONTRAST TECHNIQUE: Multiplanar, multiecho pulse sequences of the brain and surrounding structures were obtained without intravenous contrast. COMPARISON:  CTA head and neck, CT perfusion, and revascularization images 12/25/21 FINDINGS: Brain: Diffusion-weighted images demonstrate acute hemorrhage within the superior right MCA division involving the right insular cortex and frontal lobe. Diffuse cortical infarct involves the high right parietal cortex. Sparing of the more inferior parietal lobe and superior temporal lobe noted. Focal area of nonhemorrhagic infarct is present in the right occipital pole. Contralateral left acute parietal and posterior left frontal lobe cortical infarcts are present as well. T2 hyperintensities are associated with the areas of acute/subacute infarction. Focal susceptibility involving the left parietal infarct noted. Additional areas of cortical hemorrhage are present in the high posterior right parietal lobe and in the right frontal operculum. Smaller foci of hemorrhage are present in the posterior left frontal infarct. Minimal white matter changes are present otherwise. The ventricles are of normal size. No significant extraaxial fluid collection is present. The internal auditory canals are within normal limits. The brainstem and cerebellum are within normal limits. Vascular: Flow is present in the major intracranial arteries. Skull and upper cervical spine: The craniocervical junction is normal. Upper cervical spine is within normal limits. Marrow signal is unremarkable. Sinuses/Orbits: Bilateral mastoid effusions are present. There is some fluid in the posterior nasopharynx. OG tube and endotracheal tube are in place. The paranasal sinuses and mastoid air cells are otherwise clear.  The globes and orbits are within normal limits. IMPRESSION: 1. 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 2. Ricarda Frame

## 2022-01-01 NOTE — Progress Notes (Addendum)
1830: RT notified of new small cuff leak, will come to bedside to assess. ? ?1840: RT at bedside, adding air to cuff with no improvement in leak, suspect ET cuff blown.  ? ?1844: CCM notified, intubation supplies gathered and RSI medications pulled from [facility] per order. ? ?1855: Tube exchange performed, RT informed RN that cuff on new ET tube also blown. RN immediately notified CCM.  ? ?1900: Tube exchange performed again, cuff inflation successful with bilateral breath sounds present.  ?

## 2022-01-01 NOTE — Progress Notes (Signed)
? ?NAME:  Vincent Ferrell, MRN:  [id number], DOB:  June 15, 1961, LOS: 7 ?ADMISSION DATE:  12/25/2021, CONSULTATION DATE:  12/25/2021 ?REFERRING MD:  Dr. Corliss Skains, CHIEF COMPLAINT:  left sided weakness  ? ?History of Present Illness:  ?HPI mostly obtained from medical chart review.  Attempted from patient but limited given aphasia.  ? ?61 year old male with prior history of HTN, HLD, and GERD who presented to ER as code stroke.  Patient was at the vet's office when he acutely developed left sided weakness and right gaze deviation at 0945.  Code stroke activated by EMS in addition to code STEMI based on diffuse STE.  Of note, patient recently diagnosed with RLL bronchopneumonia two days ago by his PCP.   ? ?He was evaluated by Neurology and Cardiology in ER, HR in 150s with no chest pain or dyspnea, with EKG not meeting criteria for STEMI but showed mild STE, felt possibly rate related versus possible pericarditis.  Echo and cardiac workup ordered.  CT head showed concern for subacute infarcts in the R PCA territory and two small areas possibly representing calcification or blood, therefore thrombolytics deferred.  CTA head/ neck and perfusion study showed acute right MCA anterior M2 occlusion.   ?Labs significant for WBC 20.6, Hgb 9.2, Hct 26.8, plts 205, normal coags, K 4.9, bicarb 21, glucose 138, BUN 34, sCr 1.83, iCa 1.03, low albumin/ protein, lactic acid 1.5. Trop hs pending.  CXR showing elevation of right hemidiaphragm with pulmonary vascular congestion.  Blood cultures sent.   Temp 100 in ER, remains ST, and initial BP 140/90, and remains on room air > 94%.  He was taken emergently to Neuro IR for mechanical thrombectomy with complete revascularization achieving TICI 3 revascularization.  Patient was extubated post intervention.  Briefly required phenylephrine in PACU.  Received 2L LR and albumin in procedure.  PCCM consulted for further medical assistance given concern for sepsis and possible cardioembolic  event related to stroke. ? ?Patient is currently only able to nod yes/ no to question given expressive aphasia and mostly incomprehensible speech.  Nodded yes to recent fever, cough with productive sputum, and headache.  Nods yes to recent bloody stools.  Did indicate that he started antibiotics from PCP.  Denied current or previous SOB, CP, palpitations, N/V/abd pain, lower leg swelling.   Patient indicates he lives alone, not married, no children, and has living parents out of state.  Non smoker, occasional but not daily ETOH, denies drug abuse.  ? ?Pertinent  Medical History  ?HLT, HLD, GERD, rising PSA level, impaired fasting glucose ? 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  Cards consulted for initial code STEMI with diffuse STE, no STEMI. Required 4 units of pRBCs for lower GI bleed.  ?4/17: Large bloody BM. Bedside colonoscopy with blood throughout colon. 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 Hemoglobin decreased to 6.8. Additional 2 units of pRBCs and FFP given.  ?4/18: Bedside EGD and colonoscopy with evidence of black blood but no source of bleeding identified. MRI of the prostate demonstrated large abscess. Patient taken for drainage by Urology. Cultures obtained.  ?4/19: Propofol switched to Precedex in anticipation of extubation.  ?4/20: Increased agitation with worsening oxygen requirements overnight. Required switch back to Propofol. CXR with atelectasis +/- hypervolemia. Lasix given. TCTS consulted for concern of valvular insufficiency. Repeat echo with increased peri-cardial effusion size.  ? ?  Interim History / Subjective:  ? ?No significant events overnight. Patient has remained afebrile for 24 hours now. Leukocytosis improving.  ? ?This AM, propofol decreased to 40 mcg/kg/min. With this change, patient noted to  become hypertensive, tachycardic and tachypneic with vent dyssynchrony.   ? ?Objective   ?Blood pressure 132/84, pulse (!) 104, temperature 99.1 ?F (37.3 ?C), temperature source Axillary, resp. rate (!) 22, height 5' 11.5" (1.816 m), weight 100.6 kg, SpO2 99 %. ?   ?Vent Mode: PRVC ?FiO2 (%):  [40 %-60 %] 40 % ?Set Rate:  [20 bmp] 20 bmp ?Vt Set:  [600 mL] 600 mL ?PEEP:  [8 cmH20] 8 cmH20 ?Plateau Pressure:  [13 cmH20-25 cmH20] 25 cmH20  ? ?Intake/Output Summary (Last 24 hours) at 01/01/2022 0936 ?Last data filed at 01/01/2022 0900 ?Gross per 24 hour  ?Intake 2539.92 ml  ?Output 4100 ml  ?Net -1560.08 ml  ? ? ?Filed Weights  ? 12/25/21 1000 12/27/21 0500  ?Weight: 100.8 kg 100.6 kg  ? ?Examination:  ?General: Acutely ill-appearing middle-age male, lying on the bed, orally intubated ?HEENT: Moist mucus membranes. ETT/OGT in place ?Neuro: Sedated this AM. Not following commands. Pupillary reflex intact. Improvement in upward gaze.  ?CV: Regular rhythm with minimal tachycardia. No rub or murmurs.  ?PULM: No wheezing or rales today. No increased work of breathing.  ?GI: Soft, non-distended, hypoactive bowel sounds. 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 Stable ? ?Resolved Hospital Problem list   ?Septic Shock, resolved  ?Ileus/partial small bowel obstruction ? ?Assessment & Plan:  ? ?Mitral Valve Endocarditis ?Pericardial Effusion ?- TEE on 4/17 with 1 cm x 0.5 cm vegetation involving both atrial and ventricular sides.  ?No significant stenosis or regurgitation on initial TEE.  ?- Blood cultures negative throughout admission, however on Doxycycline PTA ?- No definite source identified as of yet. It is possible prostate abscess is the source though however this would be extremely unusual. At this point, it is unlikely source will be identified  ?- Infectious Disease following; appreciate their recommendations   ?- Discussed with ID narrowing antibiotics today. Discontinue Vancomycin and Rocephin ?- Start Nafcillin per pharmacy dosing. Patient will need a 6 week course. ?- TCTS consulted. No indication for MV surgery at this time ?- Discussed with ID regarding pericardial effusion. Given widespread seeding of MSSA, there is a high likelihood that effusion is infectious in nature. Due to this, they are recommending drainage. ?- Will consult Cardiology for consideration of pericardiocentesis  ? ?Acute hypoxic respiratory failure ?Multi-focal pneumonia ?- Diagnosed with RLL pneumonia PTA on Doxycycline. CTA on admission with results concerning for multi-focal pneumonia involving bilateral upper and lower lobes. Completed 5 day course of CAP/aspiration coverage.  ?- Continue lung protective ventilation ?- At this point, patient's agitation and anxiety seem to be contributing significantly in ability to wean and tolerate SBT. Any reductions in Propofol dose result in hypertension, tachycardia and tachypnea with vent dyssynchrony. ?- Continue PAD protocol with Propofol and Fentanyl for today ?- Add Klonopin 0.5 mg BID and Seroquel 50 mg BID  ?- If tolerating well, would recommend transitioning Propofol to Precedex tomorrow  ? ?MSSA Prostate Abscess ?- On admission CTA, area of lobulation within the inferolateral portion of the prostate. PSA markedly elevated at 25.7. Previously mildly elevated at 1.6. Due to this, Urology consulted with MRI prostate demonstrating large abscess. S/p transurethral resection on 4/18. Urine cultures growing MSSA ?- Urology has signed  off. Will need outpatient follow up ?- Will need to continue foley catheter for at least 2 weeks prior to voiding trial   ? ?Acute R MCA, with M2 occlusion s/p mechanical thrombectomy with complete revascularization ?Bilateral Embolic Infarcts  ?- Given MV endocarditis and no prior history of atherosclerotic disease, suspect this to be secondary to septic emboli ?-  Examination demonstrates persistent left-sided hemiplegia with fixed vertical gaze ?- Stroke team is following ?- SBP goal 120-140 ?- Continue Lipitor  ? ?Acute blood loss anemia s/p 6 units of pRBC transfusions

## 2022-01-01 NOTE — Progress Notes (Signed)
?   ? ?[facility] ? ?Date of Admission:  12/25/2021    ?       ?Reason for visit: Follow up on endocarditis ? ?Current antibiotics: ?Current antibiotics: ?Vancomycin and ceftriaxone ? ? ?ASSESSMENT:   ? ?61 y.o. male admitted with: ? 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  Blood cultures x4 are no growth, however, these were obtained following 3 to 4 days of doxycycline prescribed as an outpatient.  Evaluated by CVTS and no surgical intervention deemed warranted at this time. ?Prostate abscess: Initial imaging abnormalities noted on CT from admission with MRI 4/18 confirming a large abscess.  Taken to the OR 4/18 with urology for unroofing of the abscess and cultures obtained which are growing MSSA. ?Bilateral CNS infarcts: Suspected secondary to mitral valve endocarditis. ?Pericardial effusion: Repeat TTE to re-evaluate mitral valve on 4/20 with a moderate and enlarged pericardial effusion. ?Multifocal pneumonia with hypoxic respiratory failure: Requiring intubation.  Weaning from the ventilator has been challenged by agitation and encephalopathy. ?Acute kidney injury: Creatinine is stable. ?GI bleed: Colonoscopy EGD without obvious source of bleeding. ? ?RECOMMENDATIONS:   ? ?Suspect disseminated infection is all secondary to MSSA.  Will narrow from vancomycin and ceftriaxone down to nafcillin monotherapy ?Given disseminated infection would ask cardiology to evaluate for possible drainage of pericardial effusion and cultures ?Will follow ? ? ?Principal Problem: ?  Endocarditis of mitral valve ?Active Problems: ?  Acute ischemic right MCA stroke ([place]) ?  Middle cerebral artery embolism, right ?  Leukocytosis ?  Sinus tachycardia ?  Acute respiratory failure with hypoxia ([place]) ?  Prostate abscess ?  Acute kidney injury ([place]) ?   Multifocal pneumonia ?  Anemia due to GI blood loss ?  Hematochezia ? ? ? ?MEDICATIONS:   ? ?Scheduled Meds: ?? atorvastatin  40 mg Per Tube Daily  ?? chlorhexidine gluconate (MEDLINE KIT)  15 mL Mouth Rinse BID  ?? Chlorhexidine Gluconate Cloth  6 each Topical Daily  ?? clonazepam  0.5 mg Per Tube BID  ?? cloNIDine  0.1 mg Per Tube TID  ?? docusate  100 mg Per Tube BID  ?? feeding supplement (PROSource TF)  45 mL Per Tube BID  ?? insulin aspart  0-15 Units Subcutaneous Q4H  ?? mouth rinse  15 mL Mouth Rinse 10 times per day  ?? pantoprazole (PROTONIX) IV  40 mg Intravenous Q12H  ?? polyethylene glycol  17 g Per Tube Daily  ?? QUEtiapine  50 mg Per NG tube BID  ?? sodium chloride flush  3 mL Intravenous Once  ? ?Continuous Infusions: ?? sodium chloride    ?? sodium chloride 10 mL/hr at 12/31/21 0900  ?? feeding supplement (VITAL 1.5 CAL) 1,000 mL (01/01/22 0600)  ?? nafcillin (NAFCIL) continuous infusion 12 g (01/01/22 1219)  ?? propofol (DIPRIVAN) infusion 50 mcg/kg/min (01/01/22 1224)  ? ?PRN Meds:.Place/Maintain arterial line **AND** sodium chloride, sodium chloride, acetaminophen **OR** acetaminophen (TYLENOL) oral liquid 160 mg/5 mL **OR** acetaminophen, albuterol, fentaNYL (SUBLIMAZE) injection, fentaNYL (SUBLIMAZE) injection, HYDROcodone-acetaminophen, senna-docusate ? ?SUBJECTIVE:  ? ? ?Remains intubated and sedated.  Girlfriend and sister are at the bedside.  ? ?Review of Systems  ?Unable to perform ROS: Intubated  ? ?  ?OBJECTIVE:  ? ?Blood pressure 130/79, pulse 98, temperature 98.7 ?F (37.1 ?C), temperature source Axillary, resp. rate (!) 23, height 5' 11.5" (1.816 m), weight 100.6  kg, SpO2 97 %. ?Body mass index is 30.5 kg/m?. ? ?Physical Exam ?Constitutional:   ?   Comments: Intubated, sedated, lying in ICU bed.   ?HENT:  ?   Head: Normocephalic and atraumatic.  ?   Nose: Nose normal.  ?   Mouth/Throat:  ?   Comments: ET tube in place.  ?Eyes:  ?   Extraocular Movements: Extraocular movements intact.   ?   Conjunctiva/sclera: Conjunctivae normal.  ?Cardiovascular:  ?   Rate and Rhythm: Normal rate and regular rhythm.  ?Pulmonary:  ?   Comments: Ventilated, diminished at the bases.  ?Abdominal:  ?   General: There is no distension.  ?   Palpations: Abdomen is soft.  ?   Tenderness: There is no abdominal tenderness.  ?Musculoskeletal:     ?   General: No swelling.  ?   Cervical back: Normal range of motion and neck supple.  ?Skin: ?   General: Skin is warm and dry.  ?   Findings: No rash.  ?Neurological:  ?   Comments: Sedated   ? ? ? ?Lab Results: ?Lab Results  ?Component Value Date  ? WBC 11.2 (H) 01/01/2022  ? HGB 9.2 (L) 01/01/2022  ? HCT 30.0 (L) 01/01/2022  ? MCV 98.0 01/01/2022  ? PLT 448 (H) 01/01/2022  ?  ?Lab Results  ?Component Value Date  ? NA 145 01/01/2022  ? K 4.2 01/01/2022  ? CO2 23 01/01/2022  ? GLUCOSE 138 (H) 01/01/2022  ? BUN 29 (H) 01/01/2022  ? CREATININE 1.40 (H) 01/01/2022  ? CALCIUM 7.7 (L) 01/01/2022  ? GFRNONAA 58 (L) 01/01/2022  ?  ?Lab Results  ?Component Value Date  ? ALT 35 01/01/2022  ? AST 40 01/01/2022  ? ALKPHOS 57 01/01/2022  ? BILITOT 0.5 01/01/2022  ? ? ?No results found for: CRP ? ?   ?Component Value Date/Time  ? ESRSEDRATE 50 (H) 12/25/2021 1730  ? ?  ?I have reviewed the micro and lab results in Epic. ? ?Imaging: ?DG Chest Port 1 View ? ?Result Date: 01/01/2022 ?CLINICAL DATA:  61 year old male with respiratory failure. EXAM: PORTABLE CHEST - 1 VIEW COMPARISON:  12/31/2021 FINDINGS: The mediastinal contours are within normal limits. Unchanged cardiomegaly. Atherosclerotic calcification of the aortic arch. Unchanged position of indwelling endotracheal tube with the tip in the midthoracic trachea. Gastric decompression tube terminates off the inferior aspect of this image. Interval improved aeration of the lungs bilaterally with persistent bibasilar subsegmental atelectasis. No evidence of significant pleural effusion. No pneumothorax. No acute osseous abnormality.  IMPRESSION: 1. Interval improved aeration of the lungs bilaterally with persistent bibasilar subsegmental atelectasis. 2. Endotracheal tube in unchanged, appropriate position. Electronically Signed   By: Ruthann Cancer M.D.   On: 01/01/2022 07:58  ? ?DG CHEST PORT 1 VIEW ? ?Result Date: 12/31/2021 ?CLINICAL DATA:  Hypoxia. EXAM: PORTABLE CHEST 1 VIEW COMPARISON:  Same day. FINDINGS: Stable cardiomegaly. Endotracheal and nasogastric tubes are unchanged position. Increased bibasilar opacities are noted concerning for worsening atelectasis or infiltrates with associated pleural effusions. Bony thorax is unremarkable. IMPRESSION: Stable support apparatus. Increased bibasilar opacities are noted concerning for worsening atelectasis or infiltrates with associated pleural effusions. Electronically Signed   By: Marijo Conception M.D.   On: 12/31/2021 09:22  ? ?DG Chest Port 1 View ? ?Result Date: 12/31/2021 ?CLINICAL DATA:  Acute respiratory failure. EXAM: PORTABLE CHEST 1 VIEW COMPARISON:  Chest radiograph dated 12/26/2011. FINDINGS: Endotracheal tube with tip approximately 6 cm above the carina. Enteric tube  extends below the diaphragm with tip beyond the inferior margin of the image. There is mild cardiomegaly with mild vascular congestion. Bibasilar atelectasis. Pneumonia is not excluded. Trace bilateral pleural effusions may be present. No pneumothorax. Atherosclerotic calcification of the aortic arch. No acute osseous pathology. IMPRESSION: 1. Endotracheal tube with tip above the carina. 2. Mild cardiomegaly with mild vascular congestion. 3. Bibasilar atelectasis. Pneumonia is not excluded. Electronically Signed   By: Anner Crete M.D.   On: 12/31/2021 03:13  ? ?ECHOCARDIOGRAM LIMITED ? ?Result Date: 12/31/2021 ?   ECHOCARDIOGRAM LIMITED REPORT   Patient Name:   Vincent Ferrell Date of Exam: 12/31/2021 Medical Rec #:  [id number]         Height:       71.5 in Accession #:    [id number]        Weight:       221.8 lb Date  of Birth:  06-03-1961         BSA:          2.215 m? Patient Age:    61 years          BP:           138/87 mmHg Patient Gender: M                 HR:           76 bpm. Exam Location:  Inpatient Procedure: Limited Ech

## 2022-01-02 DIAGNOSIS — I3139 Other pericardial effusion (noninflammatory): Secondary | ICD-10-CM | POA: Diagnosis not present

## 2022-01-02 DIAGNOSIS — I059 Rheumatic mitral valve disease, unspecified: Secondary | ICD-10-CM | POA: Diagnosis not present

## 2022-01-02 LAB — CULTURE, BLOOD (ROUTINE X 2)
Culture: NO GROWTH
Culture: NO GROWTH
Special Requests: ADEQUATE
Special Requests: ADEQUATE

## 2022-01-02 LAB — CBC WITH DIFFERENTIAL/PLATELET
Abs Immature Granulocytes: 0.07 10*3/uL (ref 0.00–0.07)
Basophils Absolute: 0.1 10*3/uL (ref 0.0–0.1)
Basophils Relative: 1 %
Eosinophils Absolute: 0.3 10*3/uL (ref 0.0–0.5)
Eosinophils Relative: 3 %
HCT: 29.6 % — ABNORMAL LOW (ref 39.0–52.0)
Hemoglobin: 9.1 g/dL — ABNORMAL LOW (ref 13.0–17.0)
Immature Granulocytes: 1 %
Lymphocytes Relative: 14 %
Lymphs Abs: 1.5 10*3/uL (ref 0.7–4.0)
MCH: 29.9 pg (ref 26.0–34.0)
MCHC: 30.7 g/dL (ref 30.0–36.0)
MCV: 97.4 fL (ref 80.0–100.0)
Monocytes Absolute: 1.3 10*3/uL — ABNORMAL HIGH (ref 0.1–1.0)
Monocytes Relative: 12 %
Neutro Abs: 7.3 10*3/uL (ref 1.7–7.7)
Neutrophils Relative %: 69 %
Platelets: 395 10*3/uL (ref 150–400)
RBC: 3.04 MIL/uL — ABNORMAL LOW (ref 4.22–5.81)
RDW: 15.3 % (ref 11.5–15.5)
WBC: 10.4 10*3/uL (ref 4.0–10.5)
nRBC: 0.5 % — ABNORMAL HIGH (ref 0.0–0.2)

## 2022-01-02 LAB — GLUCOSE, CAPILLARY
Glucose-Capillary: 118 mg/dL — ABNORMAL HIGH (ref 70–99)
Glucose-Capillary: 123 mg/dL — ABNORMAL HIGH (ref 70–99)
Glucose-Capillary: 118 mg/dL — ABNORMAL HIGH (ref 70–99)
Glucose-Capillary: 118 mg/dL — ABNORMAL HIGH (ref 70–99)
Glucose-Capillary: 109 mg/dL — ABNORMAL HIGH (ref 70–99)

## 2022-01-02 LAB — BASIC METABOLIC PANEL
Anion gap: 6 (ref 5–15)
BUN: 27 mg/dL — ABNORMAL HIGH (ref 6–20)
CO2: 25 mmol/L (ref 22–32)
Calcium: 7.8 mg/dL — ABNORMAL LOW (ref 8.9–10.3)
Chloride: 115 mmol/L — ABNORMAL HIGH (ref 98–111)
Creatinine, Ser: 1.2 mg/dL (ref 0.61–1.24)
GFR, Estimated: >60 mL/min (ref >60)
Glucose, Bld: 120 mg/dL — ABNORMAL HIGH (ref 70–99)
Potassium: 4.7 mmol/L (ref 3.5–5.1)
Sodium: 146 mmol/L — ABNORMAL HIGH (ref 135–145)

## 2022-01-02 MED ORDER — SODIUM CHLORIDE 0.9 % IV SOLN: 250.0000 mL | INTRAVENOUS | Status: DC

## 2022-01-02 MED ORDER — LORAZEPAM 2 MG/ML IJ SOLN
INTRAMUSCULAR | Status: AC
  Administered 2022-01-02: 2 mg via INTRAVENOUS
  Filled 2022-01-02: qty 1

## 2022-01-02 MED ORDER — FUROSEMIDE 10 MG/ML IJ SOLN
40.0000 mg | INTRAMUSCULAR | Status: AC
  Administered 2022-01-02: 40 mg via INTRAVENOUS
  Filled 2022-01-02: qty 4

## 2022-01-02 MED ORDER — LORAZEPAM 2 MG/ML IJ SOLN
2.0000 mg | Freq: Once | INTRAMUSCULAR | Status: AC
  Administered 2022-01-02: 2 mg via INTRAVENOUS
  Filled 2022-01-02: qty 1

## 2022-01-02 MED ORDER — DEXMEDETOMIDINE HCL IN NACL 400 MCG/100ML IV SOLN
0.4000 ug/kg/h | INTRAVENOUS | Status: DC
  Administered 2022-01-02: 0.4 ug/kg/h via INTRAVENOUS
  Filled 2022-01-02: qty 100

## 2022-01-02 MED ORDER — NOREPINEPHRINE 4 MG/250ML-% IV SOLN
2.0000 ug/min | INTRAVENOUS | Status: DC
  Administered 2022-01-02: 2 ug/min via INTRAVENOUS
  Filled 2022-01-02: qty 250

## 2022-01-02 MED ORDER — LORAZEPAM 2 MG/ML IJ SOLN: 2.0000 mg | Freq: Once | INTRAMUSCULAR | Status: AC

## 2022-01-02 NOTE — Progress Notes (Signed)
STROKE TEAM PROGRESS NOTE  ? ?SUBJECTIVE (INTERVAL HISTORY) ?His sister and girlfriend are at the bedside.  Patient still intubated, sedated on propofol.  Patient afebrile this am and leukocytosis continues to be much improved.  ID on board, cardiology has seen the patient and feel pericardial effusion is too small to be tapped safely and recommend continuing antibiotics and conservative care.  Pulmonary critical care team plan to wean off sedation to see how he does.  Neuro exam is limited due to heavy sedation ? ?OBJECTIVE ?Temp:  [98.5 ?F (36.9 ?C)-100.2 ?F (37.9 ?C)] 98.5 ?F (36.9 ?C) (04/22 1200) ?Pulse Rate:  [87-113] 113 (04/22 1300) ?Cardiac Rhythm: Sinus tachycardia (04/22 0800) ?Resp:  [10-31] 25 (04/22 1300) ?BP: (91-175)/(62-117) 128/81 (04/22 1300) ?SpO2:  [91 %-100 %] 91 % (04/22 1300) ?FiO2 (%):  [30 %-50 %] 40 % (04/22 1117) ? ?Recent Labs  ?Lab 01/01/22 ?1919 01/01/22 ?2318 01/02/22 ?4332 01/02/22 ?9518 01/02/22 ?1115  ?GLUCAP 119* 116* 118* 123* 118*  ? ?Recent Labs  ?Lab 12/29/21 ?0617 12/30/21 ?0301 12/30/21 ?8416 12/31/21 ?6063 12/31/21 ?0160 12/31/21 ?1437 01/01/22 ?0309 01/02/22 ?1093  ?NA 154* 148*   < > 147* 146* 146* 145 146*  ?K 3.6 4.0   < > 4.3 4.3 3.9 4.2 4.7  ?CL 125* 118*  --  114*  --   --  117* 115*  ?CO2 21* 22  --  22  --   --  23 25  ?GLUCOSE 131* 130*  --  117*  --   --  138* 120*  ?BUN 36* 23*  --  28*  --   --  29* 27*  ?CREATININE 1.81* 1.52*  --  1.51*  --   --  1.40* 1.20  ?CALCIUM 7.9* 7.5*  --  7.8*  --   --  7.7* 7.8*  ? < > = values in this interval not displayed.  ? ?Recent Labs  ?Lab 12/27/21 ?2355 01/01/22 ?0309  ?AST 33 40  ?ALT 24 35  ?ALKPHOS 47 57  ?BILITOT 0.6 0.5  ?PROT 4.2* 5.2*  ?ALBUMIN 1.6* 1.8*  ? ?Recent Labs  ?Lab 12/29/21 ?0617 12/29/21 ?7322 12/30/21 ?0301 12/30/21 ?0254 12/31/21 ?2706 12/31/21 ?2376 12/31/21 ?1437 12/31/21 ?1453 12/31/21 ?2111 01/01/22 ?0309 01/02/22 ?2831  ?WBC 10.4  --  22.8*  --  19.4*  --   --   --   --  11.2* 10.4  ?NEUTROABS  --    --   --   --   --   --   --   --   --  9.3* 7.3  ?HGB 8.4*   < > 8.4*   < > 9.6*   < > 8.8* 9.3* 9.6* 9.2* 9.1*  ?HCT 26.3*   < > 26.3*   < > 31.3*   < > 26.0* 31.5* 32.1* 30.0* 29.6*  ?MCV 93.3  --  96.3  --  97.2  --   --   --   --  98.0 97.4  ?PLT 352  --  379  --  392  --   --   --   --  448* 395  ? < > = values in this interval not displayed.  ? ?No results for input(s): CKTOTAL, CKMB, CKMBINDEX, TROPONINI in the last 168 hours. ?No results for input(s): LABPROT, INR in the last 72 hours. ? ?No results for input(s): COLORURINE, LABSPEC, PHURINE, GLUCOSEU, HGBUR, BILIRUBINUR, KETONESUR, PROTEINUR, UROBILINOGEN, NITRITE, LEUKOCYTESUR in the last 72 hours. ? ?Invalid input(s): APPERANCEUR ?  ?   ?  Component Value Date/Time  ? CHOL 89 12/26/2021 0230  ? TRIG 142 01/01/2022 0309  ? HDL <10 (L) 12/26/2021 0230  ? CHOLHDL NOT CALCULATED 12/26/2021 0230  ? VLDL 43 (H) 12/26/2021 0230  ? LDLCALC NOT CALCULATED 12/26/2021 0230  ? ?Lab Results  ?Component Value Date  ? HGBA1C 6.0 (H) 12/25/2021  ? ?   ?Component Value Date/Time  ? LABOPIA NONE DETECTED 12/26/2021 0116  ? COCAINSCRNUR NONE DETECTED 12/26/2021 0116  ? LABBENZ NONE DETECTED 12/26/2021 0116  ? AMPHETMU NONE DETECTED 12/26/2021 0116  ? THCU NONE DETECTED 12/26/2021 0116  ? LABBARB NONE DETECTED 12/26/2021 0116  ?  ?No results for input(s): ETH in the last 168 hours. ? ?I have personally reviewed the radiological images below and agree with the radiology interpretations. ? ?DG Abd 1 View ? ?Result Date: 12/25/2021 ?CLINICAL DATA:  Orogastric tube placement. EXAM: ABDOMEN - 1 VIEW COMPARISON:  None. FINDINGS: An orogastric tube is seen with its distal tip overlying the expected region of the gastric antrum. The bowel gas pattern is normal. No radio-opaque calculi or other significant radiographic abnormality are seen. IMPRESSION: Orogastric tube positioning, as described above. Electronically Signed   By: Aram Candelahaddeus  Houston M.D.   On: 12/25/2021 19:09  ? ?DG Abd  1 View ? ?Result Date: 12/25/2021 ?CLINICAL DATA:  OG tube placement. EXAM: ABDOMEN - 1 VIEW COMPARISON:  One view chest x-ray 12:45 p.m. FINDINGS: Gaseous distention of the stomach noted. Bowel gas pattern otherwise unremarkable. OG tube is not visualized over the distal esophagus or stomach. IMPRESSION: OG tube is not visualized over the distal esophagus or stomach. Gaseous distention of the stomach. Electronically Signed   By: Marin Robertshristopher  Mattern M.D.   On: 12/25/2021 19:06  ? ?MR ANGIO HEAD WO CONTRAST ? ?Result Date: 12/26/2021 ?CLINICAL DATA:  Acute infarct. Revascularization of superior right M2 segment. EXAM: MRA HEAD WITHOUT CONTRAST TECHNIQUE: Angiographic images of the Circle of Willis were acquired using MRA technique without intravenous contrast. COMPARISON:  CTA head and neck 12/25/2021.  Revascularization films. FINDINGS: Anterior circulation: The internal carotid arteries are within normal limits from high cervical segments through the ICA termini bilaterally. The A1 and M1 segments are normal. MCA bifurcations are within normal limits. MCA bifurcations are intact. The revascularized superior right M2 segments remain patent. ACA and MCA branch vessels are normal bilaterally. Posterior circulation: The vertebral arteries are codominant. AICA vessels are dominant. The basilar artery is within normal limits. Right posterior cerebral artery originates from basilar tip. Left posterior cerebral artery is of fetal type. A left P1 segment contributes. PCA branch vessels are within normal limits bilaterally. Anatomic variants: None Other: None. IMPRESSION: 1. Normal variant MRA Circle of Willis without evidence for significant proximal stenosis, aneurysm, or branch vessel occlusion. 2. The revascularized superior right M2 segments remain patent. Electronically Signed   By: Marin Robertshristopher  Mattern M.D.   On: 12/26/2021 16:54  ? ?MR BRAIN WO CONTRAST ? ?Result Date: 12/26/2021 ?CLINICAL DATA:  Status post  revascularization of right superior M2 occlusion. EXAM: MRI HEAD WITHOUT CONTRAST TECHNIQUE: Multiplanar, multiecho pulse sequences of the brain and surrounding structures were obtained without intravenous contrast. COMPARISON:  CTA head and neck, CT perfusion, and revascularization images 12/25/21 FINDINGS: Brain: Diffusion-weighted images demonstrate acute hemorrhage within the superior right MCA division involving the right insular cortex and frontal lobe. Diffuse cortical infarct involves the high right parietal cortex. Sparing of the more inferior parietal lobe and superior temporal lobe noted. Focal area of nonhemorrhagic infarct is  present in the right occipital pole. Contralateral left acute parietal and posterior left frontal lobe cortical infarcts are present as well. T2 hyperintensities are associated with the areas of acute/subacute infarction. Focal susceptibility involving the left parietal infarct noted. Additional areas of cortical hemorrhage are present in the high posterior right parietal lobe and in the right frontal operculum. Smaller foci of hemorrhage are present in the posterior left frontal infarct. Minimal white matter changes are present otherwise. The ventricles are of normal size. No significant extraaxial fluid collection is present. The internal auditory canals are within normal limits. The brainstem and cerebellum are within normal limits. Vascular: Flow is present in the major intracranial arteries. Skull and upper cervical spine: The craniocervical junction is normal. Upper cervical spine is within normal limits. Marrow signal is unremarkable. Sinuses/Orbits: Bilateral mastoid effusions are present. There is some fluid in the posterior nasopharynx. OG tube and endotracheal tube are in place. The paranasal sinuses and mastoid air cells are otherwise clear. The globes and orbits are within normal limits. IMPRESSION: 1. Acute/subacute cortical infarct involving the superior right MCA  division involving the right insular cortex and frontal lobe spite revascularization. 2. Contralateral acute/subacute cortical infarcts involving the left parietal and posterior left frontal lobe. 3. Focal area o

## 2022-01-02 NOTE — Progress Notes (Signed)
An USGPIV (ultrasound guided PIV) has been placed for short-term vasopressor infusion. A correctly placed ivWatch must be used when administering Vasopressors. Should this treatment be needed beyond 72 hours, central line access should be obtained.  It will be the responsibility of the bedside nurse to follow best practice to prevent extravasations.  ?Vincent Ferrell at bedside, connected pressors and placed IV Watch. ?

## 2022-01-02 NOTE — Progress Notes (Signed)
Ultrasound guided IV in R forearm noted to have warm, red area proximal to site approximately 15 cm x 5 cm in size. Nafcillin running through IV. Nafcillin infusion stopped, pharmacy notified of infiltration. RN instructed to apply ice packs to site and notify of worsening. IV removed and new IV obtained in left upper arm. Nafcillin infusion restarted in new IV with excellent blood return.  ?

## 2022-01-02 NOTE — Progress Notes (Addendum)
? ?NAME:  Vincent GowdaDavid Mckendree, MRN:  [id number], DOB:  Mar 07, 1961, LOS: 8 ?ADMISSION DATE:  12/25/2021, CONSULTATION DATE:  12/25/2021 ?REFERRING MD:  Dr. Corliss Skainseveshwar, CHIEF COMPLAINT:  left sided weakness  ? ?History of Present Illness:  ?HPI mostly obtained from medical chart review.  Attempted from patient but limited given aphasia.  ? ?61 year old male with prior history of HTN, HLD, and GERD who presented to ER as code stroke.  Patient was at the vet's office when he acutely developed left sided weakness and right gaze deviation at 0945.  Code stroke activated by EMS in addition to code STEMI based on diffuse STE.  Of note, patient recently diagnosed with RLL bronchopneumonia two days ago by his PCP.   ? ?He was evaluated by Neurology and Cardiology in ER, HR in 150s with no chest pain or dyspnea, with EKG not meeting criteria for STEMI but showed mild STE, felt possibly rate related versus possible pericarditis.  Echo and cardiac workup ordered.  CT head showed concern for subacute infarcts in the R PCA territory and two small areas possibly representing calcification or blood, therefore thrombolytics deferred.  CTA head/ neck and perfusion study showed acute right MCA anterior M2 occlusion.   ?Labs significant for WBC 20.6, Hgb 9.2, Hct 26.8, plts 205, normal coags, K 4.9, bicarb 21, glucose 138, BUN 34, sCr 1.83, iCa 1.03, low albumin/ protein, lactic acid 1.5. Trop hs pending.  CXR showing elevation of right hemidiaphragm with pulmonary vascular congestion.  Blood cultures sent.   Temp 100 in ER, remains ST, and initial BP 140/90, and remains on room air > 94%.  He was taken emergently to Neuro IR for mechanical thrombectomy with complete revascularization achieving TICI 3 revascularization.  Patient was extubated post intervention.  Briefly required phenylephrine in PACU.  Received 2L LR and albumin in procedure.  PCCM consulted for further medical assistance given concern for sepsis and possible cardioembolic  event related to stroke. ? ?Patient is currently only able to nod yes/ no to question given expressive aphasia and mostly incomprehensible speech.  Nodded yes to recent fever, cough with productive sputum, and headache.  Nods yes to recent bloody stools.  Did indicate that he started antibiotics from PCP.  Denied current or previous SOB, CP, palpitations, N/V/abd pain, lower leg swelling.   Patient indicates he lives alone, not married, no children, and has living parents out of state.  Non smoker, occasional but not daily ETOH, denies drug abuse.  ? ?Pertinent  Medical History  ?HLT, HLD, GERD, rising PSA level, impaired fasting glucose ? 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  Cards consulted for initial code STEMI with diffuse STE, no STEMI. Required 4 units of pRBCs for lower GI bleed.  ?4/17: Large bloody BM. Bedside colonoscopy with blood throughout colon. 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 Hemoglobin decreased to 6.8. Additional 2 units of pRBCs and FFP given.  ?4/18: Bedside EGD and colonoscopy with evidence of black blood but no source of bleeding identified. MRI of the prostate demonstrated large abscess. Patient taken for drainage by Urology. Cultures obtained.  ?4/19: Propofol switched to Precedex in anticipation of extubation.  ?4/20: Increased agitation with worsening oxygen requirements overnight. Required switch back to Propofol. CXR with atelectasis +/- hypervolemia. Lasix given. TCTS consulted for concern of valvular insufficiency. Repeat echo with increased peri-cardial effusion size.   ?  4/22 no significant issues overnight, remains on propofol infusion due to agitation and anxiety ? ?Interim History / Subjective:  ?Sedated on ventilator ?Family at bedside and updated ? ?Objective   ?Blood pressure (!) 156/86,  pulse 99, temperature 98.8 ?F (37.1 ?C), temperature source Axillary, resp. rate (!) 24, height 5' 11.5" (1.816 m), weight 100.6 kg, SpO2 98 %. ?   ?Vent Mode: CPAP;PSV ?FiO2 (%):  [30 %-50 %] 40 % ?Set Rate:  [20 bmp] 20 bmp ?Vt Set:  [600 mL] 600 mL ?PEEP:  [8 cmH20] 8 cmH20 ?Pressure Support:  [10 cmH20] 10 cmH20 ?Plateau Pressure:  [10 cmH20-26 cmH20] 19 cmH20  ? ?Intake/Output Summary (Last 24 hours) at 01/02/2022 0946 ?Last data filed at 01/02/2022 0600 ?Gross per 24 hour  ?Intake 2207.8 ml  ?Output 2540 ml  ?Net -332.2 ml  ? ? ?Filed Weights  ? 12/25/21 1000 12/27/21 0500  ?Weight: 100.8 kg 100.6 kg  ? ?Examination:  ?General: Acute on chronic ill-appearing elderly gentleman lying in bed in no acute distress ?HEENT: ETT, MM pink/moist, PERRL,  ?Neuro: Sedated on ventilator ?CV: s1s2 regular rate and rhythm, no murmur, rubs, or gallops,  ?PULM: Clear to auscultation bilaterally, no increased work of breathing, no added breath sounds ?GI: soft, bowel sounds active in all 4 quadrants, non-tender, non-distended, tolerating TF ?Extremities: warm/dry, 1+ pitting edema  ?Skin: no rashes or lesions ? ?Resolved Hospital Problem list   ?Septic Shock, resolved  ?Ileus/partial small bowel obstruction ? ?Assessment & Plan:  ? ?Mitral Valve Endocarditis ?Pericardial Effusion ?- TEE on 4/17 with 1 cm x 0.5 cm vegetation involving both atrial and ventricular sides.  ?-No significant stenosis or regurgitation on initial TEE.  ?- Blood cultures negative throughout admission, however on Doxycycline PTA ?- No definite source identified as of yet. It is possible prostate abscess is the source though however this would be extremely unusual. At this point, it is unlikely source will be identified  ?P: ?Infectious disease following, appreciate assistance ?Per discussion with ID 4/21 antibiotics de-escalated, Naifcillin started ?TCTS consulted, no indications for MV surgery at this time ?Cardiology consulted for possible  pericardiocentesis but felt risk outweighed benefit currently ? ?Acute hypoxic respiratory failure ?Multi-focal pneumonia ?- Diagnosed with RLL pneumonia PTA on Doxycycline. CTA on admission with results concerning for multi-focal pneumonia involving bilateral upper and lower lobes. Completed 5 day course of CAP/aspiration coverage.  ?P: ?Patient's progressive agitation and anxiety resulted in inability to tolerate SBT ?Continue ventilator support with lung protective strategies  ?Wean PEEP and FiO2 for sats greater than 90%. ?Head of bed elevated 30 degrees. ?Plateau pressures less than 30 cm H20.  ?Follow intermittent chest x-ray and ABG.   ?SAT/SBT as tolerated, mentation preclude extubation  ?Ensure adequate pulmonary hygiene  ?Follow cultures  ?VAP bundle in place  ?PAD protocol ?Seroquel given increased today ?Continue Klonopin ?May need to consider tracheostomy early next week, confirmed with patient's daughter and wife at bedside 4/22 they would want tracheostomy if indicated ? ?MSSA Prostate Abscess ?- On admission CTA, area of lobulation within the inferolateral portion of the prostate. PSA markedly elevated at 25.7. Previously mildly elevated at 1.6. Due to this, Urology consulted with MRI prostate demonstrating large abscess. S/p transurethral resection on 4/18. Urine cultures growing MSSA ?- Urology has signed off. Will need outpatient follow up ?P: ?Continue Foley catheter x2 weeks per urology ?Antibiotics as above ? ?Acute R MCA, with M2 occlusion s/p mechanical thrombectomy with complete revascularization ?Bilateral Embolic Infarcts  ?- Given  MV endocarditis and no prior history of atherosclerotic disease, suspect this to be secondary to septic emboli ?- Examination demonstrates persistent left-sided hemiplegia with fixed vertical gaze ?P: ?Primary management per neurology ?SBP goal 1 20-1 40 ?Continue secondary stroke prevention with statin ?Maintain neuro protective measures ?Nutrition and bowel  regiment  ?Seizure precautions  ?Aspirations precautions  ? ?Acute blood loss anemia s/p 6 units of pRBC transfusions ?Lower GI bleeding ?Gastritis 2/2 OG tube  ?- Patient endorsed blood stools PTA. Significant lower GI

## 2022-01-02 NOTE — Progress Notes (Signed)
1205: RN called into room for desynchrony with ventilator and small non-purposeful movements on right side, since sedation was recently decreased, RN left to obtain PRN fentanyl to assess if problem was pain related ? ?1208: Upon return to room, RN found new small jerking movements on left upper arm and inward nonpurposeful rhythmic movements on legs. Pt. No longer responding to painful stimuli on right side of body. Pupils with unchanged hippus from earlier assessments. Whitney CCM called, order for 2mg  ativan given for concern of seizure. RN asked to not increase sedation at this time. 2mg  ativan administered. ? ?1217: No improvement in symptoms post ativan, Whitney CCM called again. Additional order of 2 mg ativan given. Ativan administered. CCM on the way to bedside to assess pt. ? ?1225: Whitney CCM at bedside, rhythmic jerking of left upper extremity still present with no improvement after ativan admin. 50 mcg fentanyl push administered to assess if pain related. Jerking of left upper extremity decreased slightly following fentanyl administration.  ? ?1230: Per CCM okay for RN to increase sedation to obtain vent synchrony. Propofol increased to 50. Per Whitney order for head CT due to no pain response on right side will be given once patient is calmer.  ? ? ? ? ?

## 2022-01-02 NOTE — Progress Notes (Signed)
PCCM progress note ? ?Called to bedside by bedside nurse stating patient displayed generalized.  Instructed nurse to administer 2 mg of IV Ativan and assess for trauma cessation.  No improvement seen with benzodiazepine push.  Nurse also reported no movement to painful stimuli (new from a.m. assessment). On my assessment patient was seen with faint focal right upper extremity tremor with inward pull of bilateral lower extremities.  Increased work of breathing also identified on assessment. ? ?Instructed nurse to administer IV opioid to determine if tremor was secondary to agitation.  Tremor significantly improved post opioid administration. ? ?Will titrate sedation medication per PAD protocol.  Also obtain repeat head CT.  Consider application of EEG if tremor persist ? ?Hassen Bruun D. Harris, NP-C ?[facility] ?Personal contact information can be found on Amion  ?01/02/2022, 12:46 PM ? ? ? ? ?

## 2022-01-02 NOTE — Plan of Care (Signed)
?  Interdisciplinary Goals of Care Family Meeting ? ? ?Date carried out: 01/02/2022 ? ?Location of the meeting: Bedside ? ?Member's involved: Nurse Practitioner, Bedside Registered Nurse, and Family Member or next of kin ? ?Durable Power of Tour manager: Shared, family ? ?Discussion: We discussed goals of care for [facility] .  Patient's spouse and daughter updated at bedside 4/22 regarding continued critical illness.  Patient has been hospitalized for 7 days as of 4/22 with continued critical care support indicated.  Patient's family was updated that continue ventilator requirement factorial including agitation/anxiety and new stroke leading to inability to protect airway currently.  Confirmed with family they wish to continue aggressive care with all interventions available including likely eventual tracheostomy.  ? ?Code status: Full Code ? ?Disposition: Continue current acute care ? ? ?Time spent for the meeting: 25 min ? ? ?Vincent Panik D. Harris, NP-C ?[facility] ?Personal contact information can be found on Amion  ?01/02/2022, 10:15 AM ? ? ?

## 2022-01-02 NOTE — Progress Notes (Signed)
1330: RN informed Whitney CCM of BP 71/48 (57). RN instructed to decrease precedex dose and call back if unchanged.  ? ?1335: BP 68/46 (54). CCM notified. Order for peripheral infusion of levophed given. Dr. Pearlean Brownie in room, recommended MRI once patient is stable.  ? ?1405: Levophed started, IV team RN in room obtaining US guided access ?

## 2022-01-02 NOTE — Progress Notes (Signed)
Discussed propofol titration order with Janeann Forehand CCM NP. Per Alphonzo Lemmings okay for RN to titrate propofol by 5.  ?

## 2022-01-03 ENCOUNTER — Inpatient Hospital Stay (HOSPITAL_COMMUNITY): Payer: Managed Care, Other (non HMO)

## 2022-01-03 DIAGNOSIS — I059 Rheumatic mitral valve disease, unspecified: Secondary | ICD-10-CM | POA: Diagnosis not present

## 2022-01-03 LAB — CBC
HCT: 28.4 % — ABNORMAL LOW (ref 39.0–52.0)
Hemoglobin: 9 g/dL — ABNORMAL LOW (ref 13.0–17.0)
MCH: 30 pg (ref 26.0–34.0)
MCHC: 31.7 g/dL (ref 30.0–36.0)
MCV: 94.7 fL (ref 80.0–100.0)
Platelets: 405 10*3/uL — ABNORMAL HIGH (ref 150–400)
RBC: 3 MIL/uL — ABNORMAL LOW (ref 4.22–5.81)
RDW: 14.7 % (ref 11.5–15.5)
WBC: 7 10*3/uL (ref 4.0–10.5)
nRBC: 0 % (ref 0.0–0.2)

## 2022-01-03 LAB — GLUCOSE, CAPILLARY
Glucose-Capillary: 104 mg/dL — ABNORMAL HIGH (ref 70–99)
Glucose-Capillary: 112 mg/dL — ABNORMAL HIGH (ref 70–99)
Glucose-Capillary: 115 mg/dL — ABNORMAL HIGH (ref 70–99)
Glucose-Capillary: 118 mg/dL — ABNORMAL HIGH (ref 70–99)
Glucose-Capillary: 128 mg/dL — ABNORMAL HIGH (ref 70–99)
Glucose-Capillary: 132 mg/dL — ABNORMAL HIGH (ref 70–99)
Glucose-Capillary: 136 mg/dL — ABNORMAL HIGH (ref 70–99)

## 2022-01-03 LAB — BASIC METABOLIC PANEL
Anion gap: 8 (ref 5–15)
BUN: 28 mg/dL — ABNORMAL HIGH (ref 6–20)
CO2: 27 mmol/L (ref 22–32)
Calcium: 8 mg/dL — ABNORMAL LOW (ref 8.9–10.3)
Chloride: 111 mmol/L (ref 98–111)
Creatinine, Ser: 1.18 mg/dL (ref 0.61–1.24)
GFR, Estimated: >60 mL/min (ref >60)
Glucose, Bld: 145 mg/dL — ABNORMAL HIGH (ref 70–99)
Potassium: 3.8 mmol/L (ref 3.5–5.1)
Sodium: 146 mmol/L — ABNORMAL HIGH (ref 135–145)

## 2022-01-03 LAB — ANAEROBIC CULTURE W GRAM STAIN

## 2022-01-03 IMAGING — MR MR HEAD W/O CM
10 of 11 series · 42 of 48 positions shown · non-contrast
Comparison: [DATE]

CLINICAL DATA: Stroke follow-up

EXAM:
MRI HEAD WITHOUT CONTRAST
TECHNIQUE: Multiplanar, multiecho pulse sequences of the brain and surrounding
structures were obtained without intravenous contrast.

[Series 5: DWI · axial · 3.0mm · 0.88mm/px · z∈[-119,+30]mm · 5 of 51 slices shown (1 of 4)]
[im 1/51]
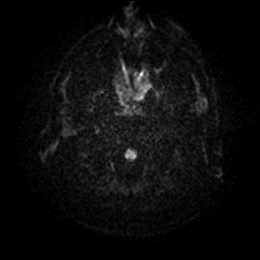
[im 13/51]
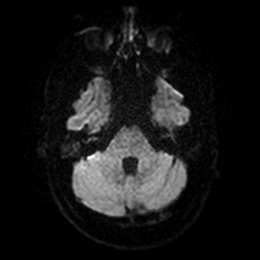
[im 26/51]
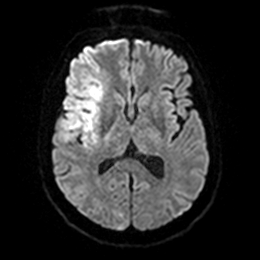
[im 38/51]
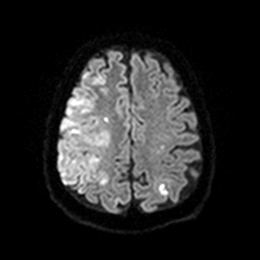
[im 51/51]
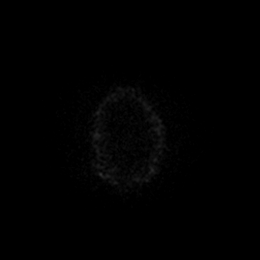

[Series 6: DWI · axial · 3.0mm · 0.88mm/px · z∈[-119,+30]mm · 5 of 50 slices shown (2 of 4)]
[im 1/50]
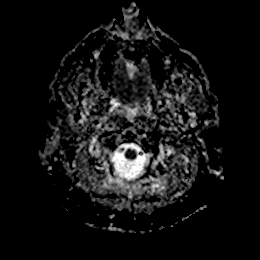
[im 13/50]
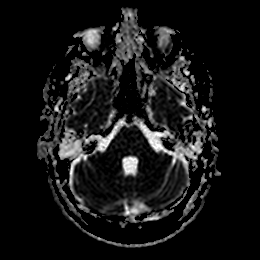
[im 25/50]
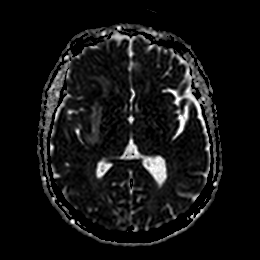
[im 37/50]
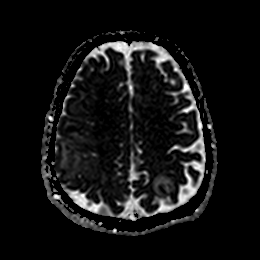
[im 50/50]
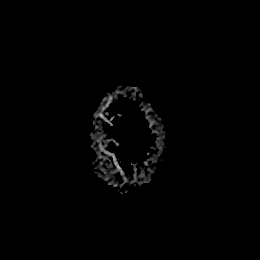

[Series 7: DWI · coronal · 4.0mm · 0.88mm/px · 4 of 37 slices shown (3 of 4)]
[im 1/37]
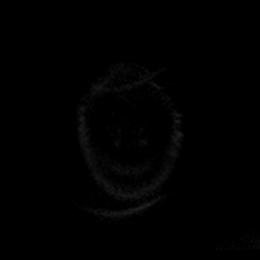
[im 13/37]
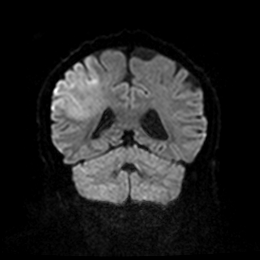
[im 25/37]
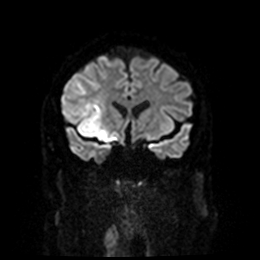
[im 37/37]
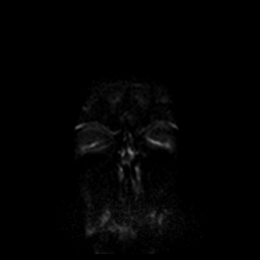

[Series 8: DWI · coronal · 4.0mm · 0.88mm/px · 4 of 37 slices shown (4 of 4)]
[im 1/37]
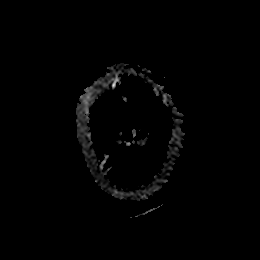
[im 13/37]
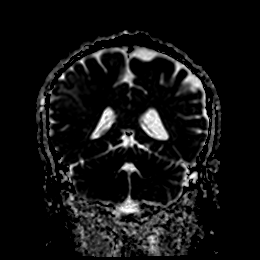
[im 25/37]
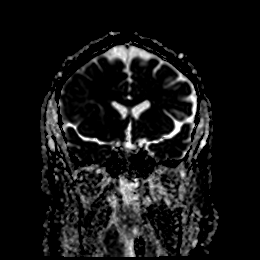
[im 37/37]
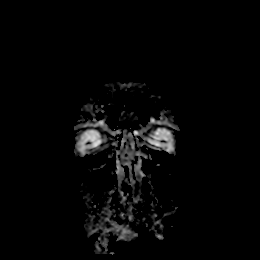

[Series 9: T1 · sagittal · 5.0mm · 0.94mm/px · 3 of 25 slices shown]
[im 1/25]
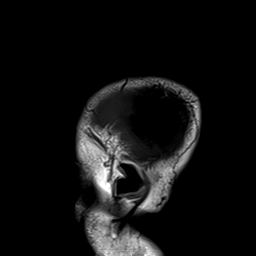
[im 13/25]
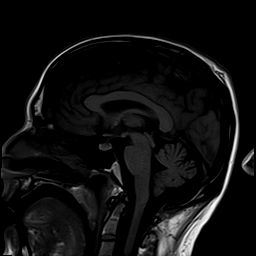
[im 25/25]
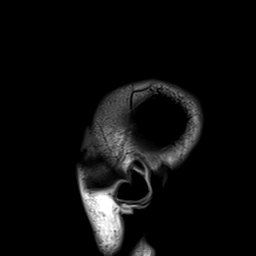

[Series 10: T2 · axial · 5.0mm · 0.90mm/px · z∈[-125,+36]mm · 3 of 28 slices shown (1 of 2)]
[im 1/28]
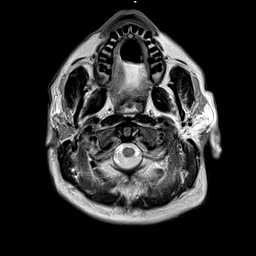
[im 14/28]
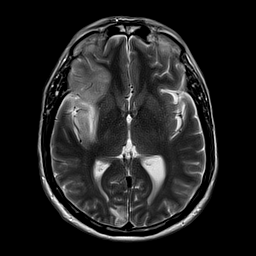
[im 28/28]
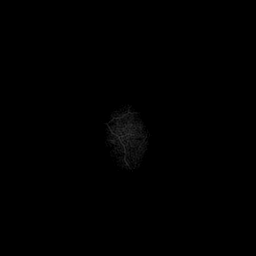

[Series 11: FLAIR · axial · 5.0mm · 0.45mm/px · z∈[-124,+36]mm · 3 of 28 slices shown]
[im 1/28]
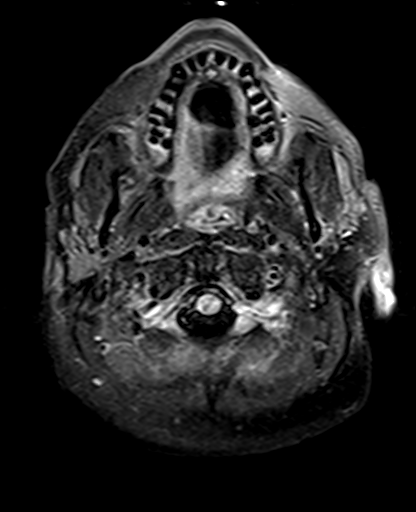
[im 14/28]
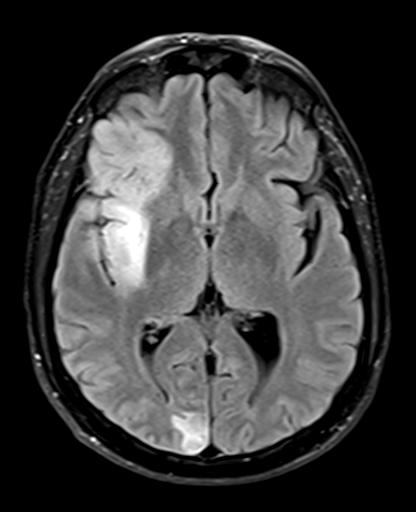
[im 28/28]
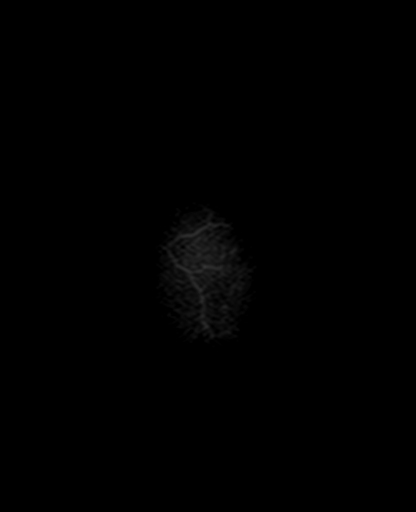

[Series 13: pha_images · axial · 3.0mm · 0.90mm/px · z∈[-120,+45]mm · 6 of 56 slices shown]
[im 1/56]
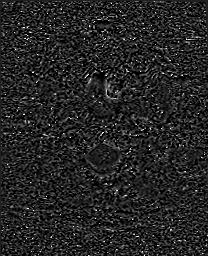
[im 12/56]
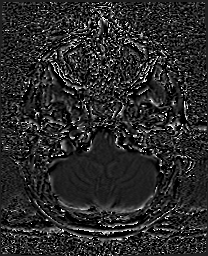
[im 23/56]
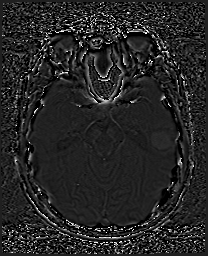
[im 34/56]
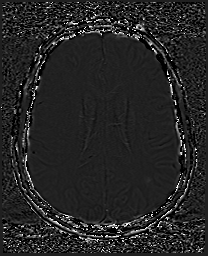
[im 45/56]
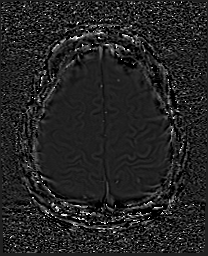
[im 56/56]
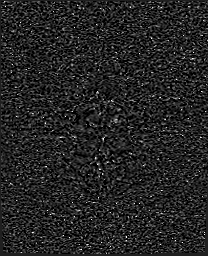

[Series 14: swi_images · axial · 3.0mm · 0.90mm/px · z∈[-120,+54]mm · 6 of 60 slices shown]
[im 1/60]
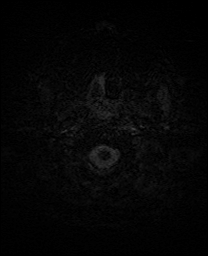
[im 12/60]
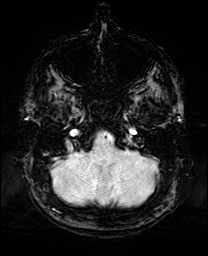
[im 24/60]
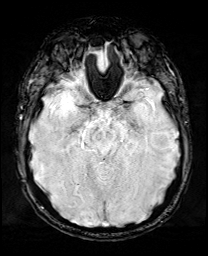
[im 36/60]
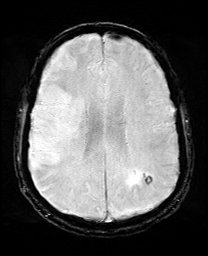
[im 48/60]
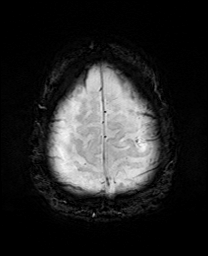
[im 60/60]
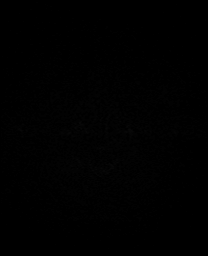

[Series 17: T2 · coronal · 5.0mm · 0.43mm/px · 3 of 32 slices shown (2 of 2)]
[im 1/32]
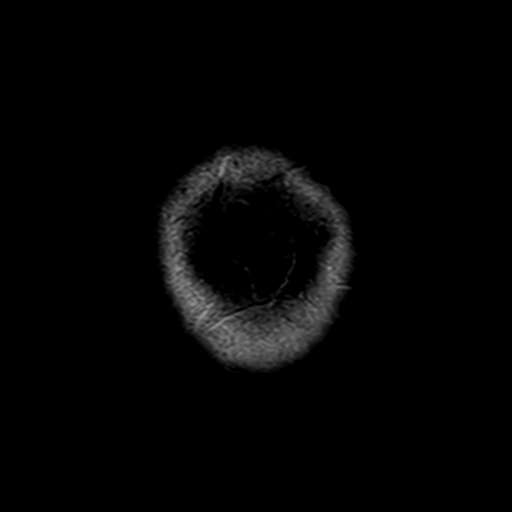
[im 16/32]
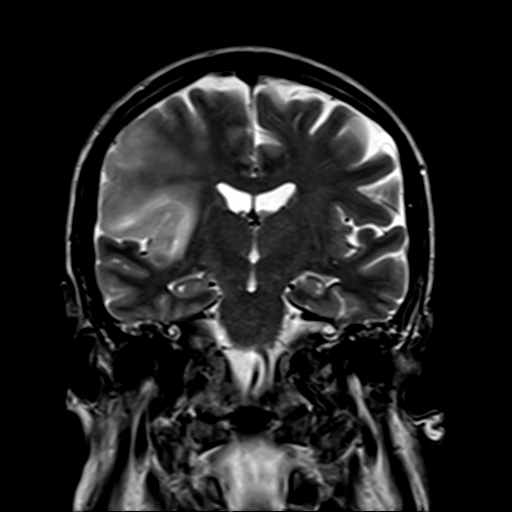
[im 32/32]
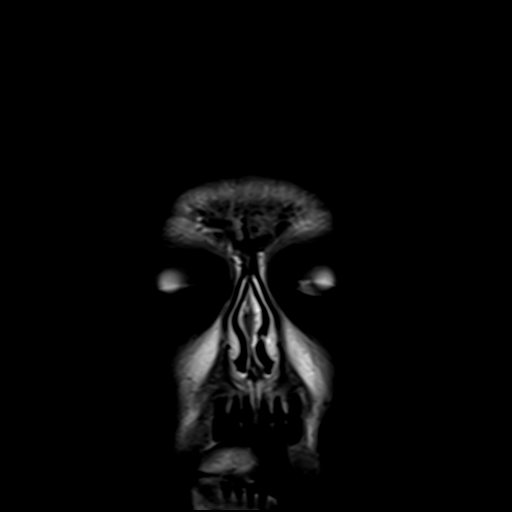

[42 of 48 positions shown; findings below may reference images not displayed]

FINDINGS: Brain: Expected evolution of diffusion abnormalities in the right
MCA and right PCA territories. Areas of diffusion restriction in the
left frontal and parietal white matter of also undergone expected
changes. Petechial hemorrhage in the left parietal lesion is
slightly diminished. No midline shift or other mass effect. Normal
white matter signal, parenchymal volume and CSF spaces. The midline
structures are normal.

Vascular: Major flow voids are preserved.

Skull and upper cervical spine: Normal calvarium and skull base.
Visualized upper cervical spine and soft tissues are normal.

Sinuses/Orbits:Mastoid effusions. Paranasal sinuses are clear.
Normal orbits.
IMPRESSION: 1. Expected evolution of subacute ischemia in the right MCA
territory, right occipital lobe and left frontal and parietal white
matter.
2. Petechial blood in the left parietal lobe. Heidelberg
classification 1a: HI1, scattered small petechiae, no mass effect.

## 2022-01-03 IMAGING — DX DG KNEE 1-2V*R*
1 series · 2 of 2 positions shown · non-contrast
Comparison: None.

CLINICAL DATA: Right knee red and warm to touch.

EXAM:
RIGHT KNEE - 1-2 VIEW

[Series 1: knee · 0.14mm/px · 2 of 2 slices shown]
[im 1/2]
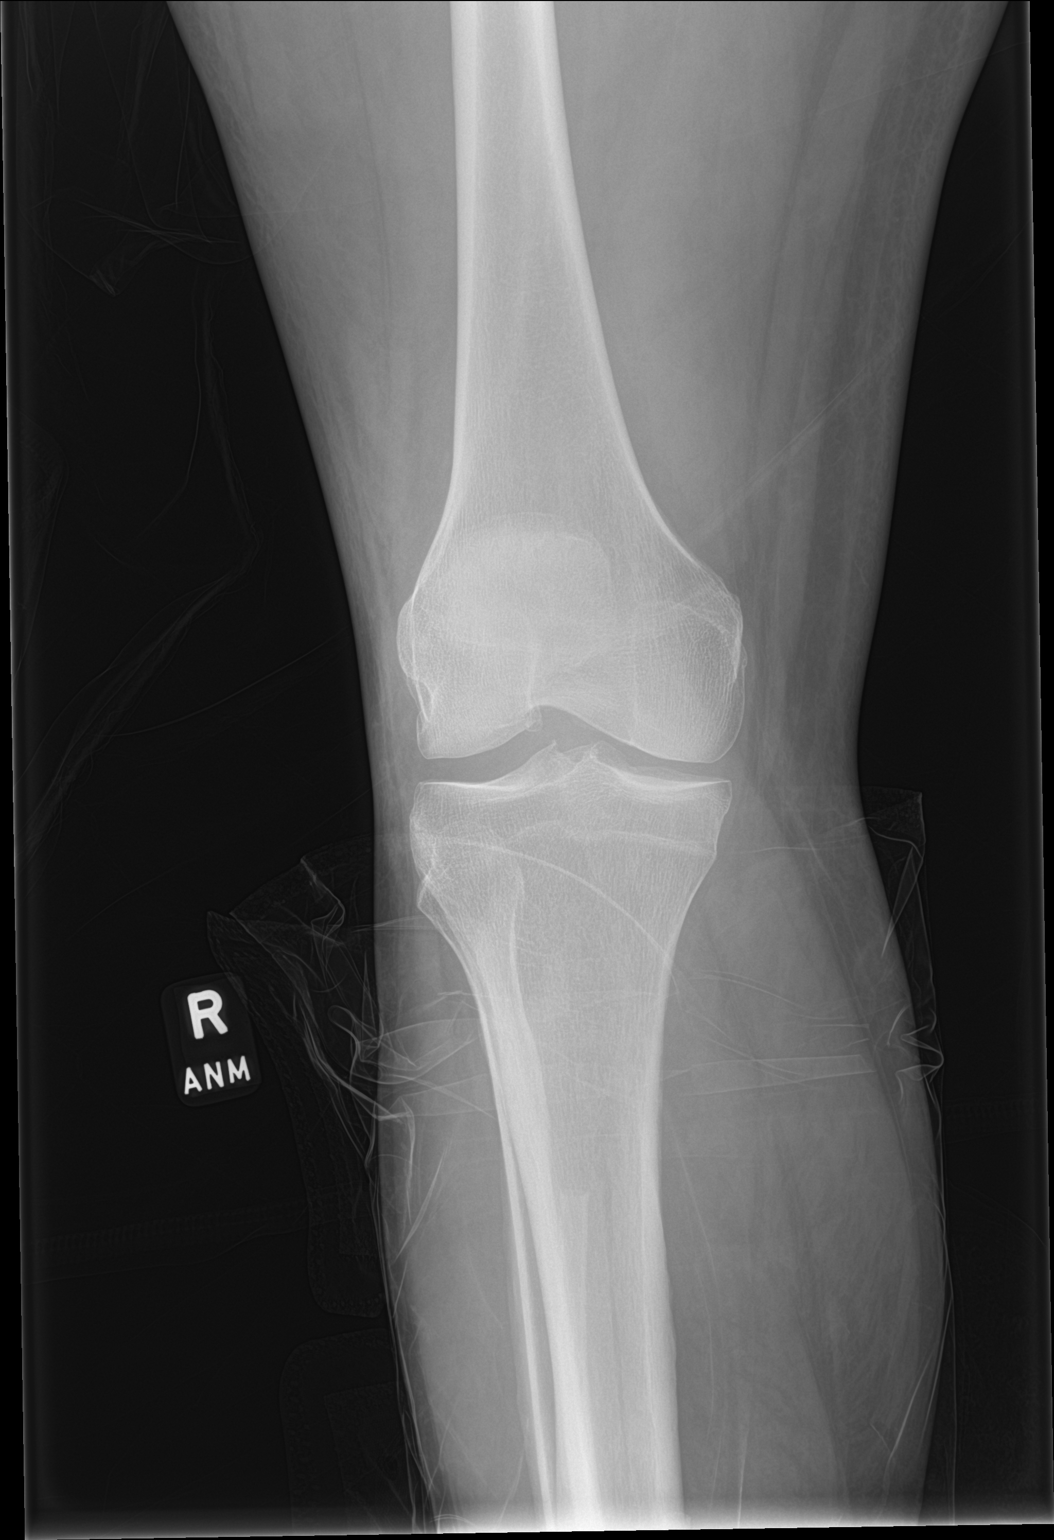
[im 2/2]
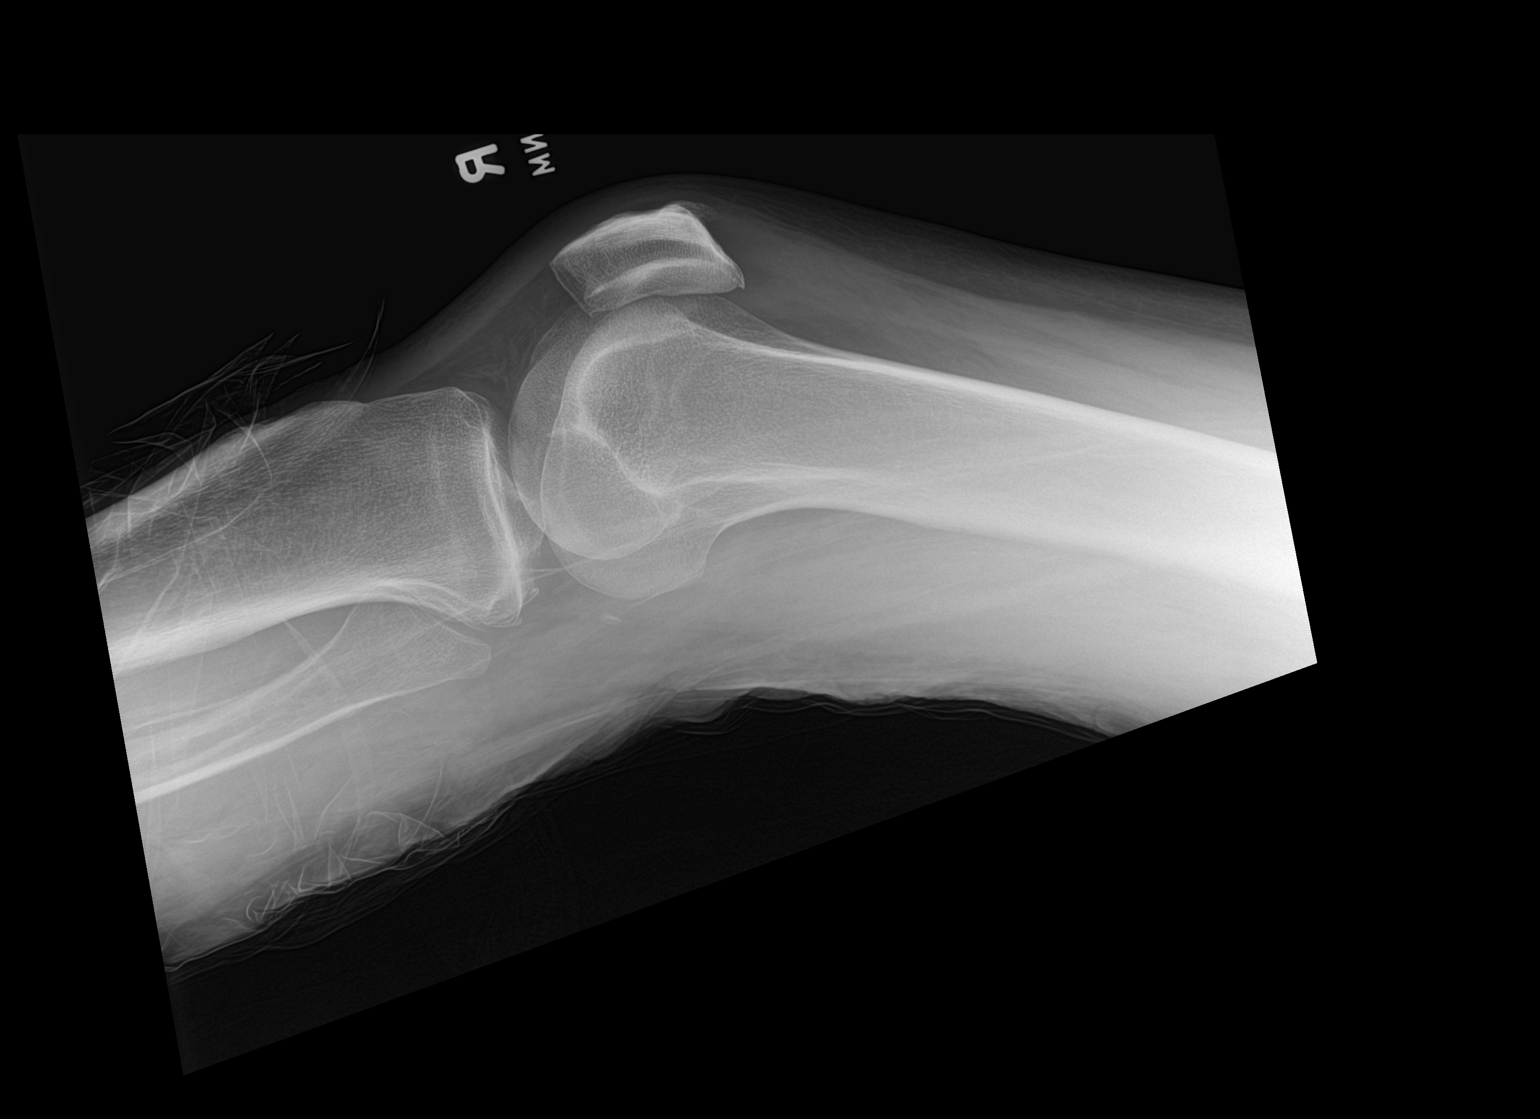

[2 of 2 positions shown; findings below may reference images not displayed]

FINDINGS: Mild superior greater than inferior patellar degenerative
osteophytes. Tiny joint effusion. Mild chronic enthesopathic change
at the quadriceps insertion on the patella. No acute fracture or
dislocation.
IMPRESSION: Tiny joint effusion.  No acute fracture.

## 2022-01-03 MED ORDER — FENTANYL CITRATE PF 50 MCG/ML IJ SOSY: 50.0000 ug | PREFILLED_SYRINGE | Freq: Once | INTRAMUSCULAR | Status: AC

## 2022-01-03 MED ORDER — FENTANYL 2500MCG IN NS 250ML (10MCG/ML) PREMIX INFUSION
50.0000 ug/h | INTRAVENOUS | Status: DC
  Administered 2022-01-03: 50 ug/h via INTRAVENOUS
  Administered 2022-01-05: 75 ug/h via INTRAVENOUS
  Filled 2022-01-03 (×2): qty 250

## 2022-01-03 MED ORDER — FENTANYL BOLUS VIA INFUSION
50.0000 ug | INTRAVENOUS | Status: DC | PRN
  Filled 2022-01-03: qty 100

## 2022-01-03 NOTE — Anesthesia Preprocedure Evaluation (Addendum)
Anesthesia Evaluation  ?Patient identified by MRN, date of birth, ID band ?Patient unresponsive ? ? ? ?Reviewed: ?Allergy & Precautions, NPO status , Patient's Chart, lab work & pertinent test results, Unable to perform ROS - Chart review only ? ?Airway ?Mallampati: Intubated ? ? ? ? ? ? Dental ?  ?Pulmonary ? ?intubated ?  ? ?+ decreased breath sounds ? ? ? ? ? Cardiovascular ?negative cardio ROS ? ? ?Rhythm:Regular  ? ?  ?Neuro/Psych ?CVA negative psych ROS  ? GI/Hepatic ?negative GI ROS, Neg liver ROS,   ?Endo/Other  ?negative endocrine ROS ? Renal/GU ?Renal disease  ? ?  ?Musculoskeletal ?negative musculoskeletal ROS ?(+)  ? Abdominal ?  ?Peds ? Hematology ? ?(+) Blood dyscrasia, anemia ,   ?Anesthesia Other Findings ? ? Reproductive/Obstetrics ? ?  ? ? ? ? ? ? ? ? ? ? ? ? ? ?  ?  ? ? ? ? ? ? ? ? ?Anesthesia Physical ?Anesthesia Plan ? ?ASA: 4 ? ?Anesthesia Plan: General  ? ?Post-op Pain Management:   ? ?Induction: Intravenous ? ?PONV Risk Score and Plan: 2 and Ondansetron and Treatment may vary due to age or medical condition ? ?Airway Management Planned: Oral ETT ? ?Additional Equipment:  ? ?Intra-op Plan:  ? ?Post-operative Plan: Post-operative intubation/ventilation ? ?Informed Consent:  ? ? ? ?History available from chart only and Only emergency history available ? ?Plan Discussed with: Surgeon and CRNA ? ?Anesthesia Plan Comments:   ? ? ? ? ? ? ?Anesthesia Quick Evaluation ? ?

## 2022-01-03 NOTE — Addendum Note (Signed)
Addendum  created 01/03/22 1544 by Val Eagle, MD  ? Clinical Note Signed  ?  ?

## 2022-01-03 NOTE — Plan of Care (Cosign Needed)
?  Interdisciplinary Goals of Care Family Meeting ? ? ?Date carried out:: 01/03/2022 ? ?Location of the meeting: Bedside ? ?Member's involved: Nurse Practitioner, Bedside Registered Nurse, and Family Member or next of kin ? ?Durable Power of Insurance risk surveyor: Shared among sister and girlfriend    ? ?Discussion: We discussed goals of care for [name]. Family was updated at bedside regarding continues critical illness including prolong ventilator requirement with persistent difficulty to wean. The family wished to continue all aggressive measure including tracheostomy and PEG placement.   ? ?Code status: Full Code ? ?Disposition: Continue current acute care ? ? ?Time spent for the meeting: 30 mins ? ?Vincent Ferrell ?01/03/2022, 12:00 PM ? ?

## 2022-01-03 NOTE — Progress Notes (Addendum)
STROKE TEAM PROGRESS NOTE  ? ?SUBJECTIVE (INTERVAL HISTORY) ?Trach and PEG this week. Agitation yesterday ?His sister and girlfriend are at the bedside.  Patient still intubated, sedated on propofol.  Patient afebrile this am and leukocytosis continues to be much improved.  ID on board, cardiology has seen the patient and feel pericardial effusion is too small to be tapped safely and recommend continuing antibiotics and conservative care.  Pulmonary critical care team tried to wean off sedation yesterday but he did not do well and developed tremulousness requiring increased sedation.  MRI of the head was repeated which showed no acute abnormalities and expected evolutionary changes in his recent strokes.  Neuro exam is limited due to heavy sedation.  Episode of agitation, extremity twitching, tachycardia, labile blood pressure yesterday.    ? ?OBJECTIVE ?Temp:  [98.5 ?F (36.9 ?C)-101 ?F (38.3 ?C)] 100.2 ?F (37.9 ?C) (04/23 0800) ?Pulse Rate:  [84-118] 100 (04/23 0804) ?Cardiac Rhythm: Sinus tachycardia (04/23 0800) ?Resp:  [19-31] 23 (04/23 0804) ?BP: (68-175)/(43-100) 115/70 (04/23 0804) ?SpO2:  [91 %-100 %] 100 % (04/23 0804) ?FiO2 (%):  [30 %-40 %] 30 % (04/23 0804) ?Weight:  [92.1 kg] 92.1 kg (04/23 0400) ? ?Recent Labs  ?Lab 01/02/22 ?1523 01/02/22 ?1944 01/03/22 ?0008 01/03/22 ?0448 01/03/22 ?[id number]  ?GLUCAP 118* 109* 118* 104* 128*  ? ? ?Recent Labs  ?Lab 12/29/21 ?0617 12/30/21 ?0301 12/30/21 ?[id number] 12/31/21 ?[id number] 12/31/21 ?[id number] 12/31/21 ?1437 01/01/22 ?0309 01/02/22 ?[id number]  ?NA 154* 148*   < > 147* 146* 146* 145 146*  ?K 3.6 4.0   < > 4.3 4.3 3.9 4.2 4.7  ?CL 125* 118*  --  114*  --   --  117* 115*  ?CO2 21* 22  --  22  --   --  23 25  ?GLUCOSE 131* 130*  --  117*  --   --  138* 120*  ?BUN 36* 23*  --  28*  --   --  29* 27*  ?CREATININE 1.81* 1.52*  --  1.51*  --   --  1.40* 1.20  ?CALCIUM 7.9* 7.5*  --  7.8*  --   --  7.7* 7.8*  ? < > = values in this interval not displayed.  ? ? ?Recent Labs  ?Lab  01/01/22 ?0309  ?AST 40  ?ALT 35  ?ALKPHOS 57  ?BILITOT 0.5  ?PROT 5.2*  ?ALBUMIN 1.8*  ? ? ?Recent Labs  ?Lab 12/29/21 ?0617 12/29/21 ?[id number] 12/30/21 ?0301 12/30/21 ?[id number] 12/31/21 ?[id number] 12/31/21 ?[id number] 12/31/21 ?1437 12/31/21 ?1453 12/31/21 ?2111 01/01/22 ?0309 01/02/22 ?[id number]  ?WBC 10.4  --  22.8*  --  19.4*  --   --   --   --  11.2* 10.4  ?NEUTROABS  --   --   --   --   --   --   --   --   --  9.3* 7.3  ?HGB 8.4*   < > 8.4*   < > 9.6*   < > 8.8* 9.3* 9.6* 9.2* 9.1*  ?HCT 26.3*   < > 26.3*   < > 31.3*   < > 26.0* 31.5* 32.1* 30.0* 29.6*  ?MCV 93.3  --  96.3  --  97.2  --   --   --   --  98.0 97.4  ?PLT 352  --  379  --  392  --   --   --   --  448* 395  ? < > = values in this interval not displayed.  ? ? ?  No results for input(s): CKTOTAL, CKMB, CKMBINDEX, TROPONINI in the last 168 hours. ?No results for input(s): LABPROT, INR in the last 72 hours. ? ?No results for input(s): COLORURINE, LABSPEC, [place], GLUCOSEU, HGBUR, BILIRUBINUR, KETONESUR, PROTEINUR, UROBILINOGEN, NITRITE, LEUKOCYTESUR in the last 72 hours. ? ?Invalid input(s): APPERANCEUR ?  ?   ?Component Value Date/Time  ? CHOL 89 12/26/2021 0230  ? TRIG 142 01/01/2022 0309  ? HDL <10 (L) 12/26/2021 0230  ? CHOLHDL NOT CALCULATED 12/26/2021 0230  ? VLDL 43 (H) 12/26/2021 0230  ? [place] NOT CALCULATED 12/26/2021 0230  ? ?Lab Results  ?Component Value Date  ? HGBA1C 6.0 (H) 12/25/2021  ? ?   ?Component Value Date/Time  ? LABOPIA NONE DETECTED 12/26/2021 0116  ? COCAINSCRNUR NONE DETECTED 12/26/2021 0116  ? LABBENZ NONE DETECTED 12/26/2021 0116  ? AMPHETMU NONE DETECTED 12/26/2021 0116  ? THCU NONE DETECTED 12/26/2021 0116  ? LABBARB NONE DETECTED 12/26/2021 0116  ?  ?No results for input(s): ETH in the last 168 hours. ? ?I have personally reviewed the radiological images below and agree with the radiology interpretations. ? ?DG Abd 1 View ? ?Result Date: 12/25/2021 ?CLINICAL DATA:  Orogastric tube placement. EXAM: ABDOMEN - 1 VIEW COMPARISON:  None. FINDINGS:  An orogastric tube is seen with its distal tip overlying the expected region of the gastric antrum. The bowel gas pattern is normal. No radio-opaque calculi or other significant radiographic abnormality are seen. IMPRESSION: Orogastric tube positioning, as described above. Electronically Signed   By: Virgina Norfolk M.D.   On: 12/25/2021 19:09  ? ?DG Abd 1 View ? ?Result Date: 12/25/2021 ?CLINICAL DATA:  OG tube placement. EXAM: ABDOMEN - 1 VIEW COMPARISON:  One view chest x-ray 12:45 p.m. FINDINGS: Gaseous distention of the stomach noted. Bowel gas pattern otherwise unremarkable. OG tube is not visualized over the distal esophagus or stomach. IMPRESSION: OG tube is not visualized over the distal esophagus or stomach. Gaseous distention of the stomach. Electronically Signed   By: San Morelle M.D.   On: 12/25/2021 19:06  ? ?MR ANGIO HEAD WO CONTRAST ? ?Result Date: 12/26/2021 ?CLINICAL DATA:  Acute infarct. Revascularization of superior right M2 segment. EXAM: MRA HEAD WITHOUT CONTRAST TECHNIQUE: Angiographic images of the Circle of Willis were acquired using MRA technique without intravenous contrast. COMPARISON:  CTA head and neck 12/25/2021.  Revascularization films. FINDINGS: Anterior circulation: The internal carotid arteries are within normal limits from high cervical segments through the ICA termini bilaterally. The A1 and M1 segments are normal. MCA bifurcations are within normal limits. MCA bifurcations are intact. The revascularized superior right M2 segments remain patent. ACA and MCA branch vessels are normal bilaterally. Posterior circulation: The vertebral arteries are codominant. AICA vessels are dominant. The basilar artery is within normal limits. Right posterior cerebral artery originates from basilar tip. Left posterior cerebral artery is of fetal type. A left P1 segment contributes. PCA branch vessels are within normal limits bilaterally. Anatomic variants: None Other: None. IMPRESSION:  1. Normal variant MRA Circle of Willis without evidence for significant proximal stenosis, aneurysm, or branch vessel occlusion. 2. The revascularized superior right M2 segments remain patent. Electronically Signed   By: San Morelle M.D.   On: 12/26/2021 16:54  ? ?MR BRAIN WO CONTRAST ? ?Result Date: 01/03/2022 ?CLINICAL DATA:  Stroke follow-up EXAM: MRI HEAD WITHOUT CONTRAST TECHNIQUE: Multiplanar, multiecho pulse sequences of the brain and surrounding structures were obtained without intravenous contrast. COMPARISON:  12/26/2021 FINDINGS: Brain: Expected evolution of diffusion abnormalities in the  right MCA and right PCA territories. Areas of diffusion restriction in the left frontal and parietal white matter of also undergone expected changes. Petechial hemorrhage in the left parietal lesion is slightly diminished. No midline shift or other mass effect. Normal white matter signal, parenchymal volume and CSF spaces. The midline structures are normal. Vascular: Major flow voids are preserved. Skull and upper cervical spine: Normal calvarium and skull base. Visualized upper cervical spine and soft tissues are normal. Sinuses/Orbits:Mastoid effusions. Paranasal sinuses are clear. Normal orbits. IMPRESSION: 1. Expected evolution of subacute ischemia in the right MCA territory, right occipital lobe and left frontal and parietal white matter. 2. Petechial blood in the left parietal lobe. Heidelberg classification 1a: HI1, scattered small petechiae, no mass effect. Electronically Signed   By: Ulyses Jarred M.D.   On: 01/03/2022 03:40  ? ?MR BRAIN WO CONTRAST ? ?Result Date: 12/26/2021 ?CLINICAL DATA:  Status post revascularization of right superior M2 occlusion. EXAM: MRI HEAD WITHOUT CONTRAST TECHNIQUE: Multiplanar, multiecho pulse sequences of the brain and surrounding structures were obtained without intravenous contrast. COMPARISON:  CTA head and neck, CT perfusion, and revascularization images 12/25/21  FINDINGS: Brain: Diffusion-weighted images demonstrate acute hemorrhage within the superior right MCA division involving the right insular cortex and frontal lobe. Diffuse cortical infarct involves the high right parieta

## 2022-01-03 NOTE — Anesthesia Postprocedure Evaluation (Signed)
Anesthesia Post Note ? ?Patient: Kaspar Uselton ? ?Procedure(s) Performed: TRANSURETHRAL RESECTION OF THE PROSTATE (TURP) (Abdomen) ? ?  ? ?Patient location during evaluation: SICU ?Anesthesia Type: General ?Level of consciousness: sedated ?Pain management: pain level controlled ?Vital Signs Assessment: post-procedure vital signs reviewed and stable ?Respiratory status: patient remains intubated per anesthesia plan ?Cardiovascular status: stable ?Postop Assessment: no apparent nausea or vomiting ?Anesthetic complications: no ? ? ?No notable events documented. ? ?Last Vitals:  ?Vitals:  ? 01/03/22 1000 01/03/22 1140  ?BP: 130/78   ?Pulse: 100 (!) 102  ?Resp: (!) 25 (!) 27  ?Temp:    ?SpO2: 100% 95%  ?  ?Last Pain:  ?Vitals:  ? 01/03/22 0400  ?TempSrc: Axillary  ?PainSc:   ? ? ?  ?  ?  ?  ?  ?  ? ?Magdaline Zollars ? ? ? ? ?

## 2022-01-03 NOTE — Progress Notes (Signed)
Pt transported to MRI and back to 4N26 w/o complication. RT will cont to monitor as needed. ?

## 2022-01-03 NOTE — Progress Notes (Signed)
? ?NAME:  Vincent GowdaDavid Mcnally, MRN:  [id number], DOB:  1960-11-23, LOS: 9 ?ADMISSION DATE:  12/25/2021, CONSULTATION DATE:  12/25/2021 ?REFERRING MD:  Dr. Corliss Skainseveshwar, CHIEF COMPLAINT:  left sided weakness  ? ?History of Present Illness:  ?HPI mostly obtained from medical chart review.  Attempted from patient but limited given aphasia.  ? ?61 year old male with prior history of HTN, HLD, and GERD who presented to ER as code stroke.  Patient was at the vet's office when he acutely developed left sided weakness and right gaze deviation at 0945.  Code stroke activated by EMS in addition to code STEMI based on diffuse STE.  Of note, patient recently diagnosed with RLL bronchopneumonia two days ago by his PCP.   ? ?He was evaluated by Neurology and Cardiology in ER, HR in 150s with no chest pain or dyspnea, with EKG not meeting criteria for STEMI but showed mild STE, felt possibly rate related versus possible pericarditis.  Echo and cardiac workup ordered.  CT head showed concern for subacute infarcts in the R PCA territory and two small areas possibly representing calcification or blood, therefore thrombolytics deferred.  CTA head/ neck and perfusion study showed acute right MCA anterior M2 occlusion.   ?Labs significant for WBC 20.6, Hgb 9.2, Hct 26.8, plts 205, normal coags, K 4.9, bicarb 21, glucose 138, BUN 34, sCr 1.83, iCa 1.03, low albumin/ protein, lactic acid 1.5. Trop hs pending.  CXR showing elevation of right hemidiaphragm with pulmonary vascular congestion.  Blood cultures sent.   Temp 100 in ER, remains ST, and initial BP 140/90, and remains on room air > 94%.  He was taken emergently to Neuro IR for mechanical thrombectomy with complete revascularization achieving TICI 3 revascularization.  Patient was extubated post intervention.  Briefly required phenylephrine in PACU.  Received 2L LR and albumin in procedure.  PCCM consulted for further medical assistance given concern for sepsis and possible cardioembolic  event related to stroke. ? ?Patient is currently only able to nod yes/ no to question given expressive aphasia and mostly incomprehensible speech.  Nodded yes to recent fever, cough with productive sputum, and headache.  Nods yes to recent bloody stools.  Did indicate that he started antibiotics from PCP.  Denied current or previous SOB, CP, palpitations, N/V/abd pain, lower leg swelling.   Patient indicates he lives alone, not married, no children, and has living parents out of state.  Non smoker, occasional but not daily ETOH, denies drug abuse.  ? ?Pertinent  Medical History  ?HLT, HLD, GERD, rising PSA level, impaired fasting glucose ? 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  Cards consulted for initial code STEMI with diffuse STE, no STEMI. Required 4 units of pRBCs for lower GI bleed.  ?4/17: Large bloody BM. Bedside colonoscopy with blood throughout colon. 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 Hemoglobin decreased to 6.8. Additional 2 units of pRBCs and FFP given.  ?4/18: Bedside EGD and colonoscopy with evidence of black blood but no source of bleeding identified. MRI of the prostate demonstrated large abscess. Patient taken for drainage by Urology. Cultures obtained.  ?4/19: Propofol switched to Precedex in anticipation of extubation.  ?4/20: Increased agitation with worsening oxygen requirements overnight. Required switch back to Propofol. CXR with atelectasis +/- hypervolemia. Lasix given. TCTS consulted for concern of valvular insufficiency. Repeat echo with increased peri-cardial effusion size.   ?  4/22 no significant issues overnight, remains on propofol infusion due to agitation and anxiety  ?4/23 No major issues overnight, MRI brian with expected stroke evolution. On assessment this right knee is red and warm to touch   ? ?Interim History / Subjective:  ?Sedated on vent  ?Tolerating vent wean on 50 of Prop  ? ?Objective   ?Blood pressure 134/82, pulse (!) 104, temperature 99.3 ?F (37.4 ?C), temperature source Axillary, resp. rate (!) 24, height 5' 11.5" (1.816 m), weight 92.1 kg, SpO2 100 %. ?   ?Vent Mode: PRVC ?FiO2 (%):  [30 %-40 %] 30 % ?Set Rate:  [20 bmp] 20 bmp ?Vt Set:  [600 mL] 600 mL ?PEEP:  [8 cmH20] 8 cmH20 ?Pressure Support:  [10 cmH20] 10 cmH20 ?Plateau Pressure:  [10 cmH20-22 cmH20] 10 cmH20  ? ?Intake/Output Summary (Last 24 hours) at 01/03/2022 0816 ?Last data filed at 01/03/2022 0600 ?Gross per 24 hour  ?Intake 2457.72 ml  ?Output 4000 ml  ?Net -1542.28 ml  ? ? ?Filed Weights  ? 12/25/21 1000 12/27/21 0500 01/03/22 0400  ?Weight: 100.8 kg 100.6 kg 92.1 kg  ? ?Examination:  ?General: Acute ill appearing middle aged male lying in bed on mechanical ventilation, in NAD ?HEENT: ETT, MM pink/moist, PERRL,  ?Neuro: Sedated on vent  ?CV: s1s2 regular rate and rhythm, no murmur, rubs, or gallops,  ?PULM:  Tolerating vent wean, mechanically breath sounds clear to ascultation,  ?GI: soft, bowel sounds active in all 4 quadrants, non-tender, non-distended, tolerating TF ?Extremities: warm/dry, generalized edema  ?Skin: no rashes or lesions ? ?Resolved Hospital Problem list   ?Septic Shock, resolved  ?Ileus/partial small bowel obstruction ? ?Assessment & Plan:  ? ?Mitral Valve Endocarditis ?Pericardial Effusion ?- TEE on 4/17 with 1 cm x 0.5 cm vegetation involving both atrial and ventricular sides.  ?-No significant stenosis or regurgitation on initial TEE.  ?- Blood cultures negative throughout admission, however on Doxycycline PTA ?- No definite source identified as of yet. It is possible prostate abscess is the source though however this would be extremely unusual. At this point, it is unlikely source will be identified  ?P: ?ID following, appreciate assistance  ?Continue Naifillin per ID  ?TCTS and Cardiology consulted,  both state there is no need for surgical intervention currently  ? ?Acute hypoxic respiratory failure ?Multi-focal pneumonia ?- Diagnosed with RLL pneumonia PTA on Doxycycline. CTA on admission with results concerning for multi-focal pneumonia involving bilateral upper and lower lobes. Completed 5 day course of CAP/aspiration coverage.  ?P: ?Agitation and mentation limit ability to wean sedation  ?Patient will need tracheostomy to allow for prolonged vent wean  ?Continue ventilator support with lung protective strategies  ?Wean PEEP and FiO2 for sats greater than 90%. ?Head of bed elevated 30 degrees. ?Plateau pressures less than 30 cm H20.  ?Follow intermittent chest x-ray and ABG.   ?SAT/SBT as tolerated, mentation preclude extubation  ?Ensure adequate pulmonary hygiene  ?Follow cultures  ?VAP bundle in place  ?PAD protocol ? ?MSSA Prostate Abscess ?- On admission CTA, area of lobulation within the inferolateral portion of the prostate. PSA markedly elevated at 25.7. Previously mildly elevated at 1.6. Due to this, Urology consulted with MRI prostate demonstrating large abscess. S/p transurethral resection on 4/18. Urine cultures growing MSSA ?- Urology has signed off. Will need outpatient follow up ?P: ?Continue Foley cath x2 weeks per urology, can consider voiding trial after  ?Antibiotics as above ? ?Acute R MCA, with M2 occlusion s/p mechanical thrombectomy with complete  revascularization ?Bilateral Embolic Infarcts  ?- Given MV endocarditis and no prior history of atherosclerotic disease, suspect this to be secondary to septic emboli ?- Examination demonstrates persistent left-sided hemiplegia with fixed vertical gaze ?P: ?Primary management per neurology  ?Maintain neuro protective measures ?Nutrition and bowel regiment  ?Seizure precautions  ?Aspirations precautions  ?Continue secondary stoke prevention  ? ?Acute blood loss anemia s/p 6 units of pRBC transfusions ?Lower GI bleeding ?Gastritis 2/2 OG tube  ?-  Patient endorsed blood stools PTA. Significant lower GI bleed during hospitalization.  ?- Bedside EGD on 4/18, and colonoscopy on 4/17, 4/18 with blood present however source not identified. Notable for gas

## 2022-01-04 ENCOUNTER — Inpatient Hospital Stay (HOSPITAL_COMMUNITY): Payer: Managed Care, Other (non HMO)

## 2022-01-04 DIAGNOSIS — I059 Rheumatic mitral valve disease, unspecified: Secondary | ICD-10-CM | POA: Diagnosis not present

## 2022-01-04 LAB — CBC
HCT: 31.8 % — ABNORMAL LOW (ref 39.0–52.0)
Hemoglobin: 9.7 g/dL — ABNORMAL LOW (ref 13.0–17.0)
MCH: 29.4 pg (ref 26.0–34.0)
MCHC: 30.5 g/dL (ref 30.0–36.0)
MCV: 96.4 fL (ref 80.0–100.0)
Platelets: 403 10*3/uL — ABNORMAL HIGH (ref 150–400)
RBC: 3.3 MIL/uL — ABNORMAL LOW (ref 4.22–5.81)
RDW: 15 % (ref 11.5–15.5)
WBC: 7.5 10*3/uL (ref 4.0–10.5)
nRBC: 0 % (ref 0.0–0.2)

## 2022-01-04 LAB — BASIC METABOLIC PANEL
Anion gap: 7 (ref 5–15)
BUN: 30 mg/dL — ABNORMAL HIGH (ref 6–20)
CO2: 29 mmol/L (ref 22–32)
Calcium: 8.1 mg/dL — ABNORMAL LOW (ref 8.9–10.3)
Chloride: 112 mmol/L — ABNORMAL HIGH (ref 98–111)
Creatinine, Ser: 1.25 mg/dL — ABNORMAL HIGH (ref 0.61–1.24)
GFR, Estimated: >60 mL/min (ref >60)
Glucose, Bld: 135 mg/dL — ABNORMAL HIGH (ref 70–99)
Potassium: 4.2 mmol/L (ref 3.5–5.1)
Sodium: 148 mmol/L — ABNORMAL HIGH (ref 135–145)

## 2022-01-04 LAB — GLUCOSE, CAPILLARY
Glucose-Capillary: 108 mg/dL — ABNORMAL HIGH (ref 70–99)
Glucose-Capillary: 113 mg/dL — ABNORMAL HIGH (ref 70–99)
Glucose-Capillary: 117 mg/dL — ABNORMAL HIGH (ref 70–99)
Glucose-Capillary: 122 mg/dL — ABNORMAL HIGH (ref 70–99)
Glucose-Capillary: 124 mg/dL — ABNORMAL HIGH (ref 70–99)
Glucose-Capillary: 161 mg/dL — ABNORMAL HIGH (ref 70–99)

## 2022-01-04 LAB — PROTIME-INR
INR: 1.1 (ref 0.8–1.2)
Prothrombin Time: 14.1 seconds (ref 11.4–15.2)

## 2022-01-04 LAB — TRIGLYCERIDES: Triglycerides: 92 mg/dL (ref <150)

## 2022-01-04 MED ORDER — STERILE WATER FOR INJECTION IJ SOLN
INTRAMUSCULAR | Status: AC
  Administered 2022-01-04: 10 mL
  Filled 2022-01-04: qty 10

## 2022-01-04 MED ORDER — MIDAZOLAM HCL 2 MG/2ML IJ SOLN
INTRAMUSCULAR | Status: AC
  Administered 2022-01-04: 5 mg via INTRAVENOUS
  Filled 2022-01-04: qty 6

## 2022-01-04 MED ORDER — PANTOPRAZOLE 2 MG/ML SUSPENSION
40.0000 mg | Freq: Two times a day (BID) | ORAL | Status: DC
  Administered 2022-01-04 – 2022-01-15 (×23): 40 mg
  Filled 2022-01-04 (×23): qty 20

## 2022-01-04 MED ORDER — MIDAZOLAM HCL 2 MG/2ML IJ SOLN: 5.0000 mg | Freq: Once | INTRAMUSCULAR | Status: AC

## 2022-01-04 MED ORDER — VECURONIUM BROMIDE 10 MG IV SOLR: 10.0000 mg | Freq: Once | INTRAVENOUS | Status: AC

## 2022-01-04 MED ORDER — ROCURONIUM BROMIDE 10 MG/ML (PF) SYRINGE
PREFILLED_SYRINGE | INTRAVENOUS | Status: AC
  Filled 2022-01-04: qty 10

## 2022-01-04 MED ORDER — VECURONIUM BROMIDE 10 MG IV SOLR
INTRAVENOUS | Status: AC
Start: 2022-01-04 — End: 2022-01-04
  Administered 2022-01-04: 10 mg via INTRAVENOUS
  Filled 2022-01-04: qty 10

## 2022-01-04 NOTE — Assessment & Plan Note (Signed)
Found on TEE involving the mitral valve. 2 vegetations 1x0.5cm ?Likely cause of stroke ? ?- complete 6 weeks of antibiotics. ?- given minimal MR, recent stroke - will treat medically.  ? ?

## 2022-01-04 NOTE — Assessment & Plan Note (Addendum)
Left sided weakness and right gaze deviation .  ?Improving and following some commands with sedation off. ? ?-Stroke rehabilitation ?- No anticoagulation at this time given active endocarditis.  ?

## 2022-01-04 NOTE — Assessment & Plan Note (Addendum)
Tolerating weaning but mental status/sedation requirements preclude extubation. Doubtful will be able to protect airway given prolonged intubation and recent stroke.  ? ?-Leave on open ended tracheostomy collar as tolerated. ?-Trach change 5/2 ?

## 2022-01-04 NOTE — Progress Notes (Signed)
PT Cancellation Note ? ?Patient Details ?Name: Vincent Ferrell ?MRN: [id number] ?DOB: 10-Jul-1961 ? ? ?Cancelled Treatment:    Reason Eval/Treat Not Completed: Medical issues which prohibited therapy; noted pt for trach this pm.  Will attempt another day. ? ? ?Elray Mcgregor ?01/04/2022, 12:49 PM ?Sheran Lawless, PT ?Acute Rehabilitation Services ?[phone] ?Office:[phone] ?01/04/2022 ? ?

## 2022-01-04 NOTE — Progress Notes (Signed)
Pre-procedure time out performed with 2 RN's, 2 RT, and Dr Denese Killings at bedside. Verbal order for 5 mg versed and 10 mg vecuronium for tracheostomy procedure. See flowsheets and MAR. ?

## 2022-01-04 NOTE — Progress Notes (Addendum)
STROKE TEAM PROGRESS NOTE  ? ?SUBJECTIVE (INTERVAL HISTORY) ?Patient is seen in his room with two family members at the bedside.  He continues to be sedated with propofol and will have a tracheostomy placed today.  He has been hemodynamically stable but febrile, and his neurological exam is stable. ? ?OBJECTIVE ?Temp:  [97.9 ?F (36.6 ?C)-101.3 ?F (38.5 ?C)] 101 ?F (38.3 ?C) (04/24 1200) ?Pulse Rate:  [89-120] 106 (04/24 1200) ?Cardiac Rhythm: Normal sinus rhythm;Sinus tachycardia (04/24 0800) ?Resp:  [18-25] 23 (04/24 1200) ?BP: (101-155)/(59-95) 102/65 (04/24 1200) ?SpO2:  [94 %-100 %] 95 % (04/24 1200) ?FiO2 (%):  [30 %] 30 % (04/24 1120) ? ?Recent Labs  ?Lab 01/03/22 ?2013 01/03/22 ?2340 01/04/22 ?2119 01/04/22 ?0720 01/04/22 ?1206  ?GLUCAP 136* 132* 113* 122* 124*  ? ? ?Recent Labs  ?Lab 12/31/21 ?4174 12/31/21 ?0814 12/31/21 ?1437 01/01/22 ?0309 01/02/22 ?4818 01/03/22 ?1020 01/04/22 ?0801  ?NA 147*   < > 146* 145 146* 146* 148*  ?K 4.3   < > 3.9 4.2 4.7 3.8 4.2  ?CL 114*  --   --  117* 115* 111 112*  ?CO2 22  --   --  23 25 27 29   ?GLUCOSE 117*  --   --  138* 120* 145* 135*  ?BUN 28*  --   --  29* 27* 28* 30*  ?CREATININE 1.51*  --   --  1.40* 1.20 1.18 1.25*  ?CALCIUM 7.8*  --   --  7.7* 7.8* 8.0* 8.1*  ? < > = values in this interval not displayed.  ? ? ?Recent Labs  ?Lab 01/01/22 ?0309  ?AST 40  ?ALT 35  ?ALKPHOS 57  ?BILITOT 0.5  ?PROT 5.2*  ?ALBUMIN 1.8*  ? ? ?Recent Labs  ?Lab 12/31/21 ?01/02/22 12/31/21 ?01/02/22 12/31/21 ?2111 01/01/22 ?0309 01/02/22 ?01/04/22 01/03/22 ?1020 01/04/22 ?0801  ?WBC 19.4*  --   --  11.2* 10.4 7.0 7.5  ?NEUTROABS  --   --   --  9.3* 7.3  --   --   ?HGB 9.6*   < > 9.6* 9.2* 9.1* 9.0* 9.7*  ?HCT 31.3*   < > 32.1* 30.0* 29.6* 28.4* 31.8*  ?MCV 97.2  --   --  98.0 97.4 94.7 96.4  ?PLT 392  --   --  448* 395 405* 403*  ? < > = values in this interval not displayed.  ? ? ?No results for input(s): CKTOTAL, CKMB, CKMBINDEX, TROPONINI in the last 168 hours. ?Recent Labs  ?  01/04/22 ?0801   ?LABPROT 14.1  ?INR 1.1  ? ? ?No results for input(s): COLORURINE, LABSPEC, PHURINE, GLUCOSEU, HGBUR, BILIRUBINUR, KETONESUR, PROTEINUR, UROBILINOGEN, NITRITE, LEUKOCYTESUR in the last 72 hours. ? ?Invalid input(s): APPERANCEUR ?  ?   ?Component Value Date/Time  ? CHOL 89 12/26/2021 0230  ? TRIG 92 01/04/2022 0447  ? HDL <10 (L) 12/26/2021 0230  ? CHOLHDL NOT CALCULATED 12/26/2021 0230  ? VLDL 43 (H) 12/26/2021 0230  ? LDLCALC NOT CALCULATED 12/26/2021 0230  ? ?Lab Results  ?Component Value Date  ? HGBA1C 6.0 (H) 12/25/2021  ? ?   ?Component Value Date/Time  ? LABOPIA NONE DETECTED 12/26/2021 0116  ? COCAINSCRNUR NONE DETECTED 12/26/2021 0116  ? LABBENZ NONE DETECTED 12/26/2021 0116  ? AMPHETMU NONE DETECTED 12/26/2021 0116  ? THCU NONE DETECTED 12/26/2021 0116  ? LABBARB NONE DETECTED 12/26/2021 0116  ?  ?No results for input(s): ETH in the last 168 hours. ? ?I have personally reviewed the radiological images below  and agree with the radiology interpretations. ? ?DG Knee 1-2 Views Right ? ?Result Date: 01/03/2022 ?CLINICAL DATA:  Right knee red and warm to touch. EXAM: RIGHT KNEE - 1-2 VIEW COMPARISON:  None. FINDINGS: Mild superior greater than inferior patellar degenerative osteophytes. Tiny joint effusion. Mild chronic enthesopathic change at the quadriceps insertion on the patella. No acute fracture or dislocation. IMPRESSION: Tiny joint effusion.  No acute fracture. Electronically Signed   By: Neita Garnetonald  Viola M.D.   On: 01/03/2022 16:17  ? ?DG Abd 1 View ? ?Result Date: 12/25/2021 ?CLINICAL DATA:  Orogastric tube placement. EXAM: ABDOMEN - 1 VIEW COMPARISON:  None. FINDINGS: An orogastric tube is seen with its distal tip overlying the expected region of the gastric antrum. The bowel gas pattern is normal. No radio-opaque calculi or other significant radiographic abnormality are seen. IMPRESSION: Orogastric tube positioning, as described above. Electronically Signed   By: Aram Candelahaddeus  Houston M.D.   On: 12/25/2021  19:09  ? ?DG Abd 1 View ? ?Result Date: 12/25/2021 ?CLINICAL DATA:  OG tube placement. EXAM: ABDOMEN - 1 VIEW COMPARISON:  One view chest x-ray 12:45 p.m. FINDINGS: Gaseous distention of the stomach noted. Bowel gas pattern otherwise unremarkable. OG tube is not visualized over the distal esophagus or stomach. IMPRESSION: OG tube is not visualized over the distal esophagus or stomach. Gaseous distention of the stomach. Electronically Signed   By: Marin Robertshristopher  Mattern M.D.   On: 12/25/2021 19:06  ? ?MR ANGIO HEAD WO CONTRAST ? ?Result Date: 12/26/2021 ?CLINICAL DATA:  Acute infarct. Revascularization of superior right M2 segment. EXAM: MRA HEAD WITHOUT CONTRAST TECHNIQUE: Angiographic images of the Circle of Willis were acquired using MRA technique without intravenous contrast. COMPARISON:  CTA head and neck 12/25/2021.  Revascularization films. FINDINGS: Anterior circulation: The internal carotid arteries are within normal limits from high cervical segments through the ICA termini bilaterally. The A1 and M1 segments are normal. MCA bifurcations are within normal limits. MCA bifurcations are intact. The revascularized superior right M2 segments remain patent. ACA and MCA branch vessels are normal bilaterally. Posterior circulation: The vertebral arteries are codominant. AICA vessels are dominant. The basilar artery is within normal limits. Right posterior cerebral artery originates from basilar tip. Left posterior cerebral artery is of fetal type. A left P1 segment contributes. PCA branch vessels are within normal limits bilaterally. Anatomic variants: None Other: None. IMPRESSION: 1. Normal variant MRA Circle of Willis without evidence for significant proximal stenosis, aneurysm, or branch vessel occlusion. 2. The revascularized superior right M2 segments remain patent. Electronically Signed   By: Marin Robertshristopher  Mattern M.D.   On: 12/26/2021 16:54  ? ?MR BRAIN WO CONTRAST ? ?Result Date: 01/03/2022 ?CLINICAL DATA:   Stroke follow-up EXAM: MRI HEAD WITHOUT CONTRAST TECHNIQUE: Multiplanar, multiecho pulse sequences of the brain and surrounding structures were obtained without intravenous contrast. COMPARISON:  12/26/2021 FINDINGS: Brain: Expected evolution of diffusion abnormalities in the right MCA and right PCA territories. Areas of diffusion restriction in the left frontal and parietal white matter of also undergone expected changes. Petechial hemorrhage in the left parietal lesion is slightly diminished. No midline shift or other mass effect. Normal white matter signal, parenchymal volume and CSF spaces. The midline structures are normal. Vascular: Major flow voids are preserved. Skull and upper cervical spine: Normal calvarium and skull base. Visualized upper cervical spine and soft tissues are normal. Sinuses/Orbits:Mastoid effusions. Paranasal sinuses are clear. Normal orbits. IMPRESSION: 1. Expected evolution of subacute ischemia in the right MCA territory, right occipital lobe and  left frontal and parietal white matter. 2. Petechial blood in the left parietal lobe. Heidelberg classification 1a: HI1, scattered small petechiae, no mass effect. Electronically Signed   By: Deatra Robinson M.D.   On: 01/03/2022 03:40  ? ?MR BRAIN WO CONTRAST ? ?Result Date: 12/26/2021 ?CLINICAL DATA:  Status post revascularization of right superior M2 occlusion. EXAM: MRI HEAD WITHOUT CONTRAST TECHNIQUE: Multiplanar, multiecho pulse sequences of the brain and surrounding structures were obtained without intravenous contrast. COMPARISON:  CTA head and neck, CT perfusion, and revascularization images 12/25/21 FINDINGS: Brain: Diffusion-weighted images demonstrate acute hemorrhage within the superior right MCA division involving the right insular cortex and frontal lobe. Diffuse cortical infarct involves the high right parietal cortex. Sparing of the more inferior parietal lobe and superior temporal lobe noted. Focal area of nonhemorrhagic infarct  is present in the right occipital pole. Contralateral left acute parietal and posterior left frontal lobe cortical infarcts are present as well. T2 hyperintensities are associated with the areas of acute/subacute

## 2022-01-04 NOTE — Progress Notes (Signed)
Reached out to Dr. Alvester Morin for order to continue foley and he gave verbal order. ?

## 2022-01-04 NOTE — Progress Notes (Addendum)
? ?[facility] Infectious Diseases Follow Up Note ? ?Patient Identification: ?Patient Name: Vincent Ferrell MRN: [id number] Admit Date: 12/25/2021 10:23 AM ?Age: 61 y.o.Today's Date: 01/04/2022 ? ? ?Reason for Visit: Follow up on endocarditis  ? ?Principal Problem: ?  Endocarditis of mitral valve ?Active Problems: ?  Acute ischemic right MCA stroke (HCC) ?  Middle cerebral artery embolism, right ?  Leukocytosis ?  Sinus tachycardia ?  Acute respiratory failure with hypoxia (HCC) ?  Prostate abscess ?  Acute kidney injury (HCC) ?  Multifocal pneumonia ?  Anemia due to GI blood loss ?  Hematochezia ?  Pericardial effusion ? ? ?Antibiotics:  ?Nafcillin 4/21-c ?Amp sulbactam 4/15-4/17, ceftriaxone 4/17-4/20 ?Vancomycin 4/14, 4/15, 4/17-4/20 ?Cefazolin/ceftriaxone/azithromycin 4/14 ? ?Lines/Hardwares:  ? ?Interval Events: Intermittent low-grade fever ? ? 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 ?Acute RT ischemic CVA s/p mechanical thrombectomy with complete revascularization  ? ?Moderate pericardial effusion without tamponade - not a surgical candidate per TCTS as well as Cardiology  ? ?Acute hypoxic respiratory failure secondary to multifocal pneumonia and #1 - intubated, failing to extubate due to mental status with plan for trach and PEG ? 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 ?AKI  ? ?RT knee erythema noted on chart - no signs of septic joint on exam. Xray 4/23 witth tiny joint effusion and no acute # ? ?GI bleed - Colonoscopy 4/18 non bleeding internal hemorrhoids. EGD Hiatal hernia/Multiple gastric polyps/Erosive gastropathy with no bleeding/mild  duodenopathy ? ? ?Recommendations ?Continue nafcillin infusion ?Monitor BMP while on Nafcillin  ?Follow up repeat echocardiogram per cardiology/Primary  for pericardial effusion ?Monitor fever curve  ?Following ? ?Rest of the management as per the primary team. ?Thank you for the consult. Please page with pertinent questions or concerns. ? ?______________________________________________________________________ ?Subjective ?patient seen and examined at the bedside. ?Family at bedside ?Plan for trach and PEG per RN ? ?Vitals ?BP (!) 155/85   Pulse (!) 102   Temp (!) 101.3 ?F (38.5 ?C) (Axillary)   Resp (!) 23   Ht 5' 11.5" (1.816 m)   Wt 92.1 kg   SpO2 98%   BMI 27.92 kg/m?  ? ?  ?Physical Exam ?Constitutional: Lying in bed ?   Comments: On 50 fentanyl and 50 propofol, on tube feeds ? ?Cardiovascular:  ?   Rate and Rhythm: Normal rate and regular rhythm.  ?   Heart sounds:  ? ?Pulmonary:  ?   Effort: Pulmonary effort is normal on vent ?   Comments: Coarse breath sounds bilaterally ? ?Abdominal:  ?   Palpations: Abdomen is soft.  ?   Tenderness:  ? ?Musculoskeletal:     ?   General: No swelling or tenderness in peripheral joints. No signs of septic joint in rt knee  ? ?Skin: ?   Comments:  ? ?Neurological:  ?   General: unable to assess  ? ?Pertinent Microbiology ?Results for orders placed or performed during the hospital encounter of 12/25/21  ?Resp Panel by RT-PCR (Flu A&B, Covid) Nasopharyngeal Swab     Status: None  ? Collection Time: 12/25/21 11:14 AM  ? Specimen: Nasopharyngeal Swab; Nasopharyngeal(NP) swabs in vial transport medium  ?Result Value Ref Range Status  ? SARS Coronavirus 2 by RT PCR NEGATIVE NEGATIVE Final  ?  Comment: (NOTE) ?  SARS-CoV-2 target nucleic acids are NOT DETECTED. ? ?The SARS-CoV-2 RNA is generally detectable in upper respiratory ?specimens during the acute phase of infection. The lowest ?concentration of SARS-CoV-2 viral copies this assay can detect is ?138 copies/mL. A negative  result does not preclude SARS-Cov-2 ?infection and should not be used as the sole basis for treatment or ?other patient management decisions. A negative result may occur with  ?improper specimen collection/handling, submission of specimen other ?than nasopharyngeal swab, presence of viral mutation(s) within the ?areas targeted by this assay, and inadequate number of viral ?copies(<138 copies/mL). A negative result must be combined with ?clinical observations, patient history, and epidemiological ?information. The expected result is Negative. ? ?Fact Sheet for Patients:  ?BloggerCourse.comhttps://www.fda.gov/media/152166/download ? ?Fact Sheet for Healthcare Providers:  ?SeriousBroker.ithttps://www.fda.gov/media/152162/download ? ?This test is no t yet approved or cleared by the [place] FDA and  ?has been authorized for detection and/or diagnosis of SARS-CoV-2 by ?FDA under an Emergency Use Authorization (EUA). This EUA will remain  ?in effect (meaning this test can be used) for the duration of the ?COVID-19 declaration under Section 564(b)(1) of the Act, 21 ?U.S.C.section 360bbb-3(b)(1), unless the authorization is terminated  ?or revoked sooner.  ? ? ?  ? Influenza A by PCR NEGATIVE NEGATIVE Final  ? Influenza B by PCR NEGATIVE NEGATIVE Final  ?  Comment: (NOTE) ?The Xpert Xpress SARS-CoV-2/FLU/RSV plus assay is intended as an aid ?in the diagnosis of influenza from Nasopharyngeal swab specimens and ?should not be used as a sole basis for treatment. Nasal washings and ?aspirates are unacceptable for Xpert Xpress SARS-CoV-2/FLU/RSV ?testing. ? ?Fact Sheet for Patients: ?BloggerCourse.comhttps://www.fda.gov/media/152166/download ? ?Fact Sheet for Healthcare Providers: ?SeriousBroker.ithttps://www.fda.gov/media/152162/download ? ?This test is not yet approved or cleared by the [place] FDA and ?has been authorized for detection and/or diagnosis of SARS-CoV-2 by ?FDA under an Emergency Use Authorization (EUA). This EUA will remain ?in effect (meaning this test can be used)  for the duration of the ?COVID-19 declaration under Section 564(b)(1) of the Act, 21 U.S.C. ?section 360bbb-3(b)(1), unless the authorization is terminated or ?revoked. ? ?Performed at [facility], [address] [place], [place] [address] ?  ?Culture, blood (Routine X 2) w Reflex to ID Panel     Status: None  ? Collection Time: 12/25/21  1:15 PM  ? Specimen: BLOOD RIGHT HAND  ?Result Value Ref Range Status  ? Specimen Description BLOOD RIGHT HAND  Final  ? Special Requests   Final  ?  BOTTLES DRAWN AEROBIC AND ANAEROBIC Blood Culture adequate volume  ? Culture   Final  ?  NO GROWTH 5 DAYS ?Performed at [facility], [address] [place], [place] [address] ?  ? Report Status 12/30/2021 FINAL  Final  ?Culture, blood (Routine X 2) w Reflex to ID Panel     Status: None  ? Collection Time: 12/25/21  1:26 PM  ? Specimen: BLOOD LEFT HAND  ?Result Value Ref Range Status  ? Specimen Description BLOOD LEFT HAND  Final  ? Special Requests   Final  ?  BOTTLES DRAWN AEROBIC AND ANAEROBIC Blood Culture results may not be optimal due to an inadequate volume of blood received in culture bottles  ? Culture   Final  ?  NO GROWTH 5 DAYS ?Performed at [facility], [address] [place], [place] [address] ?  ? Report Status 12/30/2021 FINAL  Final  ?MRSA Next Gen by PCR, Nasal     Status: None  ? Collection  Time: 12/25/21  3:52 PM  ? Specimen: Nasal Mucosa; Nasal Swab  ?Result Value Ref Range Status  ? MRSA by PCR Next Gen NOT DETECTED NOT DETECTED Final  ?  Comment: (NOTE) ?The GeneXpert MRSA Assay (FDA approved for NASAL specimens only), ?is one component of a comprehensive MRSA colonization surveillance ?program. It is not intended to diagnose MRSA infection nor to guide ?or monitor treatment for MRSA infections. ?Test performance is not FDA approved in patients less than 2 years ?old. ?Performed at [facility], [address] [place], [place] ?[address] ?  ?Culture, blood (Routine X 2) w Reflex to  ID Panel     Status: None  ? Collection Time: 12/28/21  4:35 PM  ? Specimen: BLOOD LEFT HAND  ?Result Value Ref Range Status  ? Specimen Description BLOOD LEFT HAND  Final  ? Special Requests   Final  ?  BOTTLES DRAWN AEROBIC AND A

## 2022-01-04 NOTE — Assessment & Plan Note (Signed)
Prostatic abscess found on MRI - drained. Growing S.aureus MSSA.  ? ?- Treat 6 weeks with nafcillin.  ?

## 2022-01-04 NOTE — Procedures (Signed)
Diagnostic Bronchoscopy ? ?Vincent Ferrell  ?[id number]  ?06-Dec-1960 ? ?Date:01/04/22  ?Time:4:08 PM  ? ?Provider Performing:Meleah Demeyer C Katrinka Blazing  ? ?Procedure: Diagnostic Bronchoscopy (27035) ? ?Indication(s) ?Assist with direct visualization of tracheostomy placement ? ?Consent ?Risks of the procedure as well as the alternatives and risks of each were explained to the patient and/or caregiver.  Consent for the procedure was obtained. ? ? ?Anesthesia ?See separate tracheostomy note ? ? ?Time Out ?Verified patient identification, verified procedure, site/side was marked, verified correct patient position, special equipment/implants available, medications/allergies/relevant history reviewed, required imaging and test results available. ? ? ?Sterile Technique ?Usual hand hygiene, masks, gowns, and gloves were used ? ? ?Procedure Description ?Bronchoscope advanced through endotracheal tube and into airway.  After suctioning out tracheal secretions, bronchoscope used to provide direct visualization of tracheostomy placement. ? ? ?Complications/Tolerance ?None; patient tolerated the procedure well. ? ? ?EBL ?None ? ?Specimen(s) ?None ? ?

## 2022-01-04 NOTE — Progress Notes (Signed)
SLP Cancellation Note ? ?Patient Details ?Name: Vincent Ferrell ?MRN: [id number] ?DOB: March 01, 1961 ? ? ?Cancelled treatment:       Reason Eval/Treat Not Completed: Patient not medically ready ?Patient with new tracheostomy. Orders for SLP eval and treat for PMSV and swallowing received. Will follow pt closely for readiness for SLP interventions as appropriate.  ? ? ? ?Toshua Honsinger, Katherene Ponto ?01/04/2022, 12:49 PM ?

## 2022-01-04 NOTE — Procedures (Signed)
Percutaneous Tracheostomy Procedure Note ? ? ?Vincent Ferrell  ?[id number]  ?1961-08-24 ? ?Date:01/04/22  ?Time:4:26 PM  ? ?Provider Performing:Jaquae Rieves ? ?Procedure: Percutaneous Tracheostomy with Bronchoscopic Guidance (80321) ? ?Indication(s) ?Prolonged mechanical ventilation and airway protection.  ? ?Consent ?Risks of the procedure as well as the alternatives and risks of each were explained to the patient and/or caregiver.  Consent for the procedure was obtained. ? ?Anesthesia ?Proprofol, Versed, Fentanyl, Vecuronium ? ? ?Time Out ?Verified patient identification, verified procedure, site/side was marked, verified correct patient position, special equipment/implants available, medications/allergies/relevant history reviewed, required imaging and test results available. ? ? ?Sterile Technique ?Maximal sterile technique including sterile barrier drape, hand hygiene, sterile gown, sterile gloves, mask, hair covering. ? ? ? ?Procedure Description ?Appropriate anatomy identified by palpation.  Patient's neck prepped and draped in sterile fashion.  1% lidocaine with epinephrine was used to anesthetize skin overlying neck.  1.5cm incision made and blunt dissection performed until tracheal rings could be easily palpated.   Then a size 6 Shiley tracheostomy was placed under bronchoscopic visualization using usual Seldinger technique and serial dilation.   Bronchoscope confirmed placement above the carina.  Tracheostomy was sutured in place with adhesive pad to protect skin under pressure.    Patient connected to ventilator. ? ? ?Complications/Tolerance ?None; patient tolerated the procedure well. ?Chest X-ray is ordered to confirm no post-procedural complication. ? ? ?EBL ?Minimal ? ? ?Specimen(s) ?None  ? ?Lynnell Catalan, MD FRCPC ?ICU Physician ?[facility]  ?Pager: [phone] ?Or Epic Secure Chat ?After hours: [phone]. ? ?01/04/2022, 4:27 PM ? ? ? ? ?

## 2022-01-04 NOTE — Progress Notes (Signed)
? ?NAME:  Sharlot GowdaDavid Dipierro, MRN:  [id number], DOB:  13-Sep-1961, LOS: 10 ?ADMISSION DATE:  12/25/2021, CONSULTATION DATE:  12/25/2021 ?REFERRING MD:  Dr. Corliss Skainseveshwar, CHIEF COMPLAINT:  left sided weakness  ? ?History of Present Illness:  ?HPI mostly obtained from medical chart review.  Attempted from patient but limited given aphasia.  ? ?61 year old male with prior history of HTN, HLD, and GERD who presented to ER as code stroke.  Patient was at the vet's office when he acutely developed left sided weakness and right gaze deviation at 0945.  Code stroke activated by EMS in addition to code STEMI based on diffuse STE.  Of note, patient recently diagnosed with RLL bronchopneumonia two days ago by his PCP.   ? ?He was evaluated by Neurology and Cardiology in ER, HR in 150s with no chest pain or dyspnea, with EKG not meeting criteria for STEMI but showed mild STE, felt possibly rate related versus possible pericarditis.  Echo and cardiac workup ordered.  CT head showed concern for subacute infarcts in the R PCA territory and two small areas possibly representing calcification or blood, therefore thrombolytics deferred.  CTA head/ neck and perfusion study showed acute right MCA anterior M2 occlusion.   ?Labs significant for WBC 20.6, Hgb 9.2, Hct 26.8, plts 205, normal coags, K 4.9, bicarb 21, glucose 138, BUN 34, sCr 1.83, iCa 1.03, low albumin/ protein, lactic acid 1.5. Trop hs pending.  CXR showing elevation of right hemidiaphragm with pulmonary vascular congestion.  Blood cultures sent.   Temp 100 in ER, remains ST, and initial BP 140/90, and remains on room air > 94%.  He was taken emergently to Neuro IR for mechanical thrombectomy with complete revascularization achieving TICI 3 revascularization.  Patient was extubated post intervention.  Briefly required phenylephrine in PACU.  Received 2L LR and albumin in procedure.  PCCM consulted for further medical assistance given concern for sepsis and possible cardioembolic  event related to stroke. ? ?Patient is currently only able to nod yes/ no to question given expressive aphasia and mostly incomprehensible speech.  Nodded yes to recent fever, cough with productive sputum, and headache.  Nods yes to recent bloody stools.  Did indicate that he started antibiotics from PCP.  Denied current or previous SOB, CP, palpitations, N/V/abd pain, lower leg swelling.   Patient indicates he lives alone, not married, no children, and has living parents out of state.  Non smoker, occasional but not daily ETOH, denies drug abuse.  ? ?Pertinent  Medical History  ?HLT, HLD, GERD, rising PSA level, impaired fasting glucose ? 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  Cards consulted for initial code STEMI with diffuse STE, no STEMI. Required 4 units of pRBCs for lower GI bleed.  ?4/17: Large bloody BM. Bedside colonoscopy with blood throughout colon. 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 Hemoglobin decreased to 6.8. Additional 2 units of pRBCs and FFP given.  ?4/18: Bedside EGD and colonoscopy with evidence of black blood but no source of bleeding identified. MRI of the prostate demonstrated large abscess. Patient taken for drainage by Urology. Cultures obtained.  ?4/19: Propofol switched to Precedex in anticipation of extubation.  ?4/20: Increased agitation with worsening oxygen requirements overnight. Required switch back to Propofol. CXR with atelectasis +/- hypervolemia. Lasix given. TCTS consulted for concern of valvular insufficiency. Repeat echo with increased peri-cardial effusion size.   ?  4/22 no significant issues overnight, remains on propofol infusion due to agitation and anxiety  ?4/23 No major issues overnight, MRI brian with expected stroke evolution. On assessment this right knee is red and warm to touch   ? ?Interim History / Subjective:  ?Heavily sedated due to agitation. ?Family on board for tracheostomy, planning for this afternoon at 1500. ? ?Objective   ?Blood pressure 131/74, pulse (!) 102, temperature (!) 101.3 ?F (38.5 ?C), temperature source Axillary, resp. rate (!) 23, height 5' 11.5" (1.816 m), weight 92.1 kg, SpO2 98 %. ?   ?Vent Mode: CPAP;PSV ?FiO2 (%):  [30 %] 30 % ?Set Rate:  [20 bmp] 20 bmp ?Vt Set:  [600 mL] 600 mL ?PEEP:  [8 cmH20] 8 cmH20 ?Pressure Support:  [8 cmH20] 8 cmH20 ?Plateau Pressure:  [18 cmH20-19 cmH20] 19 cmH20  ? ?Intake/Output Summary (Last 24 hours) at 01/04/2022 0825 ?Last data filed at 01/04/2022 0600 ?Gross per 24 hour  ?Intake 2507.5 ml  ?Output 2200 ml  ?Net 307.5 ml  ? ? ?Filed Weights  ? 12/25/21 1000 12/27/21 0500 01/03/22 0400  ?Weight: 100.8 kg 100.6 kg 92.1 kg  ? ?Examination:  ?General: Adult male, sedated, in NAD. ?Neuro: Sedated, not responsive. ?HEENT: [place]/AT. Sclerae anicteric. ETT in place. ?Cardiovascular: RRR, no M/R/G.  ?Lungs: Respirations even and unlabored.  CTA bilaterally, No W/R/R. ?Abdomen: BS x 4, soft, NT/ND.  ?Musculoskeletal: No gross deformities, no edema.  ?Skin: Intact, warm, no rashes. ? ? ?Resolved Hospital Problem list   ?Septic Shock, resolved  ?Ileus/partial small bowel obstruction ? ?Assessment & Plan:  ? ?Mitral Valve Endocarditis ?Pericardial Effusion ?- TEE on 4/17 with 1 cm x 0.5 cm vegetation involving both atrial and ventricular sides.  ?-No significant stenosis or regurgitation on initial TEE.  ?- Blood cultures negative throughout admission, however on Doxycycline PTA ?- No definite source identified as of yet. It is possible prostate abscess is the source though however this would be extremely unusual. At this point, it is unlikely source will be identified  ?P: ?ID following, appreciate assistance  ?Continue Naifillin per ID  ?TCTS and Cardiology consulted, both state there is no need for surgical intervention currently  ? ?Acute  hypoxic respiratory failure ?Multi-focal pneumonia ?- Diagnosed with RLL pneumonia PTA on Doxycycline. CTA on admission with results concerning for multi-focal pneumonia involving bilateral upper and lower lobes. Completed 5 day course of CAP/aspiration coverage.  ?P: ?Agitation and mentation limit ability to wean sedation; therefore, patient will need tracheostomy to allow for prolonged vent wean. We will plan for this today at 1500 ?Continue ventilator support with lung protective strategies  ?Ensure adequate pulmonary hygiene  ?Follow cultures  ?VAP bundle in place  ?PAD protocol ? ?MSSA Prostate Abscess ?- On admission CTA, area of lobulation within the inferolateral portion of the prostate. PSA markedly elevated at 25.7. Previously mildly elevated at 1.6. Due to this, Urology consulted with MRI prostate demonstrating large abscess. S/p transurethral resection on 4/18. Urine cultures growing MSSA ?- Urology has signed off. Will need outpatient follow up ?P: ?Continue Foley cath x2 weeks per urology, can consider voiding trial after  ?Antibiotics as above ? ?Acute R MCA, with M2 occlusion s/p mechanical thrombectomy with complete revascularization ?Bilateral Embolic Infarcts  ?- Given MV endocarditis and no prior history of atherosclerotic disease, suspect this to be secondary to septic emboli ?- Examination demonstrates persistent left-sided hemiplegia with fixed vertical gaze ?P: ?Primary management per neurology  ?Maintain neuro protective measures ?Nutrition and bowel regiment  ?  Seizure precautions  ?Continue secondary stoke prevention  ? ?Acute blood loss anemia s/p 6 units of pRBC transfusions ?Lower GI bleeding ?Gastritis 2/2 OG tube  ?- Patient endorsed blood stools PTA. Significant lower GI bleed during hospitalization.  ?- Bedside EGD on 4/18, and colonoscopy on 4/17, 4/18 with blood present however source not identified. Notable for gastritis secondary to OG tube but non-bleeding ?P: ?GI following,  appreciate assistance  ?Continue PPR  ?Trend CBC  ?Hgb goal > 7 ? ?Acute Kidney Injury - improved ?-Creatinine 1.83, GFR 42 on admit 4/14, creatinine peaked at 2.33, GFR 31 4/16.  5/22 creatinine normal at 1.20

## 2022-01-04 NOTE — Consult Note (Signed)
[facility] ?Consult Note ? ?Vincent Ferrell ?Mar 06, 1961  ?[id number].   ? ?Requesting MD: Lynnell Catalan ?Chief Complaint/Reason for Consult: PEG ? ?HPI:  ?Vincent Ferrell is a 61yo male PMH HTN, HLD, GERD, and recent RLL bronchopneumonia two days ago diagnosed by PCP, who presented to [facility]  12/25/21 as a code stroke after developing left sided weakness and right gaze deviation at 0945. He was taken emergently to Neuro IR for mechanical thrombectomy with complete revascularization achieving TICI 3 revascularization.  Upon presentation there was also concern for STEMI given diffuse ST elevation, but was then found to have mitral valve endocarditis which has been treated with nafillcin. Work up also revealed MSSA prostate abscess for which urology performed transurethral resection on 12/29/21.  ?Patient is undergoing tracheostomy today by CCM. Trauma asked to see for consideration of PEG. ? ?Abdominal surgical history: none per sister who is at the bedside ? ?ROS: Unable, sedated on vent ? ?History reviewed. No pertinent family history. ? ?Past Medical History:  ?Diagnosis Date  ? Chest pain in adult 10/24/2017  ? Elevated BP without diagnosis of hypertension 10/24/2017  ? Impaired fasting glucose 10/24/2017  ? Mixed hyperlipidemia 10/24/2017  ? Rising PSA level 10/24/2017  ? ? ?Past Surgical History:  ?Procedure Laterality Date  ? COLONOSCOPY WITH PROPOFOL N/A 12/28/2021  ? Procedure: COLONOSCOPY WITH PROPOFOL;  Surgeon: Tressia Danas, MD;  Location: [facility] ENDOSCOPY;  Service: Gastroenterology;  Laterality: N/A;  ? COLONOSCOPY WITH PROPOFOL N/A 12/29/2021  ? Procedure: COLONOSCOPY WITH PROPOFOL;  Surgeon: Tressia Danas, MD;  Location: [facility] ENDOSCOPY;  Service: Gastroenterology;  Laterality: N/A;  ? ESOPHAGOGASTRODUODENOSCOPY (EGD) WITH PROPOFOL N/A 12/29/2021  ? Procedure: ESOPHAGOGASTRODUODENOSCOPY (EGD) WITH PROPOFOL;  Surgeon: Tressia Danas, MD;  Location: [facility] ENDOSCOPY;  Service: Gastroenterology;   Laterality: N/A;  ? IR CT HEAD LTD  12/25/2021  ? IR PERCUTANEOUS ART THROMBECTOMY/INFUSION INTRACRANIAL INC DIAG ANGIO  12/25/2021  ? RADIOLOGY WITH ANESTHESIA N/A 12/25/2021  ? Procedure: IR WITH ANESTHESIA;  Surgeon: Radiologist, Medication, MD;  Location: [facility] OR;  Service: Radiology;  Laterality: N/A;  ? TRANSURETHRAL RESECTION OF PROSTATE N/A 12/29/2021  ? Procedure: TRANSURETHRAL RESECTION OF THE PROSTATE (TURP);  Surgeon: Crista Elliot, MD;  Location: [facility] OR;  Service: Urology;  Laterality: N/A;  ? ? ?Social History:  has no history on file for tobacco use, alcohol use, and drug use. ? ?Allergies:  ?Allergies  ?Allergen Reactions  ? Pollen Extract Other (See Comments)  ?  Sneezing   ? ? ?Medications Prior to Admission  ?Medication Sig Dispense Refill  ? aspirin EC 81 MG tablet Take 81 mg by mouth daily. Swallow whole.    ? benzonatate (TESSALON) 100 MG capsule Take 100 mg by mouth 3 (three) times daily as needed for cough.    ? doxycycline (VIBRAMYCIN) 100 MG capsule Take 100 mg by mouth 2 (two) times daily.    ? lisinopril (ZESTRIL) 5 MG tablet Take 5 mg by mouth daily.    ? loratadine (CLARITIN) 10 MG tablet Take 10 mg by mouth daily.    ? Omega-3 1000 MG CAPS Take 1,000 mg by mouth daily.    ? pantoprazole (PROTONIX) 20 MG tablet Take 20 mg by mouth daily.    ? simvastatin (ZOCOR) 40 MG tablet Take 40 mg by mouth at bedtime.    ? ? ?Prior to Admission medications   ?Medication Sig Start Date End Date Taking? Authorizing Provider  ?aspirin EC 81 MG tablet Take 81 mg by mouth daily. Swallow  whole.   Yes [provider]  ?benzonatate (TESSALON) 100 MG capsule Take 100 mg by mouth 3 (three) times daily as needed for cough. 12/22/21  Yes [provider]  ?doxycycline (VIBRAMYCIN) 100 MG capsule Take 100 mg by mouth 2 (two) times daily. 12/22/21  Yes [provider]  ?lisinopril (ZESTRIL) 5 MG tablet Take 5 mg by mouth daily. 12/03/21  Yes [provider]  ?loratadine (CLARITIN)  10 MG tablet Take 10 mg by mouth daily.   Yes [provider]  ?Omega-3 1000 MG CAPS Take 1,000 mg by mouth daily.   Yes [provider]  ?pantoprazole (PROTONIX) 20 MG tablet Take 20 mg by mouth daily. 12/03/21  Yes [provider]  ?simvastatin (ZOCOR) 40 MG tablet Take 40 mg by mouth at bedtime. 12/03/21  Yes [provider]  ? ? ?Blood pressure 102/65, pulse (!) 106, temperature (!) 101 ?F (38.3 ?C), temperature source Axillary, resp. rate (!) 23, height 5' 11.5" (1.816 m), weight 92.1 kg, SpO2 95 %. ?Physical Exam: ?General: WD/WN, but critically ill-appearing white male who is laying in bed in NAD, sedated ?HEENT: head is normocephalic, atraumatic.  Ears and nose without any masses or lesions.  Mouth is pink with ETT in place. ?Heart: regular rhythm, mildly tachy.  Normal s1,s2. No obvious murmurs, gallops, or rubs noted.   ?Lungs: CTAB, no wheezes, rhonchi, or rales noted.  Respiratory effort nonlabored on vent ?Abd: soft, ND, +BS, no masses, hernias, or organomegaly ?Skin: warm and dry with no masses, lesions, or rashes ?Psych: unable, sedated on vent ? ? ?Results for orders placed or performed during the hospital encounter of 12/25/21 (from the past 48 hour(s))  ?Glucose, capillary     Status: Abnormal  ? Collection Time: 01/02/22  3:23 PM  ?Result Value Ref Range  ? Glucose-Capillary 118 (H) 70 - 99 mg/dL  ?  Comment: Glucose reference range applies only to samples taken after fasting for at least 8 hours.  ?Glucose, capillary     Status: Abnormal  ? Collection Time: 01/02/22  7:44 PM  ?Result Value Ref Range  ? Glucose-Capillary 109 (H) 70 - 99 mg/dL  ?  Comment: Glucose reference range applies only to samples taken after fasting for at least 8 hours.  ?Glucose, capillary     Status: Abnormal  ? Collection Time: 01/03/22 12:08 AM  ?Result Value Ref Range  ? Glucose-Capillary 118 (H) 70 - 99 mg/dL  ?  Comment: Glucose reference range applies only to samples taken after  fasting for at least 8 hours.  ?Glucose, capillary     Status: Abnormal  ? Collection Time: 01/03/22  4:48 AM  ?Result Value Ref Range  ? Glucose-Capillary 104 (H) 70 - 99 mg/dL  ?  Comment: Glucose reference range applies only to samples taken after fasting for at least 8 hours.  ?Glucose, capillary     Status: Abnormal  ? Collection Time: 01/03/22  7:53 AM  ?Result Value Ref Range  ? Glucose-Capillary 128 (H) 70 - 99 mg/dL  ?  Comment: Glucose reference range applies only to samples taken after fasting for at least 8 hours.  ?CBC     Status: Abnormal  ? Collection Time: 01/03/22 10:20 AM  ?Result Value Ref Range  ? WBC 7.0 4.0 - 10.5 K/uL  ? RBC 3.00 (L) 4.22 - 5.81 MIL/uL  ? Hemoglobin 9.0 (L) 13.0 - 17.0 g/dL  ? HCT 28.4 (L) 39.0 - 52.0 %  ? MCV 94.7 80.0 -  100.0 fL  ? MCH 30.0 26.0 - 34.0 pg  ? MCHC 31.7 30.0 - 36.0 g/dL  ? RDW 14.7 11.5 - 15.5 %  ? Platelets 405 (H) 150 - 400 K/uL  ? nRBC 0.0 0.0 - 0.2 %  ?  Comment: Performed at [facility], [address] [place], [place] [address]  ?Basic metabolic panel     Status: Abnormal  ? Collection Time: 01/03/22 10:20 AM  ?Result Value Ref Range  ? Sodium 146 (H) 135 - 145 mmol/L  ? Potassium 3.8 3.5 - 5.1 mmol/L  ?  Comment: DELTA CHECK NOTED  ? Chloride 111 98 - 111 mmol/L  ? CO2 27 22 - 32 mmol/L  ? Glucose, Bld 145 (H) 70 - 99 mg/dL  ?  Comment: Glucose reference range applies only to samples taken after fasting for at least 8 hours.  ? BUN 28 (H) 6 - 20 mg/dL  ? Creatinine, Ser 1.18 0.61 - 1.24 mg/dL  ? Calcium 8.0 (L) 8.9 - 10.3 mg/dL  ? GFR, Estimated >60 >60 mL/min  ?  Comment: (NOTE) ?Calculated using the CKD-EPI Creatinine Equation (2021) ?  ? Anion gap 8 5 - 15  ?  Comment: Performed at [facility], [address] [place], [place] [address]  ?Glucose, capillary     Status: Abnormal  ? Collection Time: 01/03/22 11:49 AM  ?Result Value Ref Range  ? Glucose-Capillary 115 (H) 70 - 99 mg/dL  ?  Comment: Glucose reference range applies only to  samples taken after fasting for at least 8 hours.  ?Glucose, capillary     Status: Abnormal  ? Collection Time: 01/03/22  3:46 PM  ?Result Value Ref Range  ? Glucose-Capillary 112 (H) 70 - 99 mg/dL  ?  Commen

## 2022-01-04 NOTE — Assessment & Plan Note (Addendum)
Moderate without tamponade on 4/20. Resolved on TTE from 4/25 ? ? ?

## 2022-01-05 ENCOUNTER — Encounter (HOSPITAL_COMMUNITY)
Admission: EM | Disposition: A | Discharge status: Nursing Home (Includes ICF) | Payer: Self-pay | Source: Home / Self Care | Attending: Neurology

## 2022-01-05 ENCOUNTER — Inpatient Hospital Stay (HOSPITAL_COMMUNITY): Payer: Managed Care, Other (non HMO)

## 2022-01-05 DIAGNOSIS — I3139 Other pericardial effusion (noninflammatory): Secondary | ICD-10-CM | POA: Diagnosis not present

## 2022-01-05 DIAGNOSIS — I059 Rheumatic mitral valve disease, unspecified: Secondary | ICD-10-CM | POA: Diagnosis not present

## 2022-01-05 HISTORY — PX: ESOPHAGOGASTRODUODENOSCOPY (EGD) WITH PROPOFOL: SHX5813

## 2022-01-05 HISTORY — PX: PEG PLACEMENT: SHX5437

## 2022-01-05 LAB — COMPREHENSIVE METABOLIC PANEL
ALT: 30 U/L (ref 0–44)
AST: 42 U/L — ABNORMAL HIGH (ref 15–41)
Albumin: 1.9 g/dL — ABNORMAL LOW (ref 3.5–5.0)
Alkaline Phosphatase: 68 U/L (ref 38–126)
Anion gap: 10 (ref 5–15)
BUN: 34 mg/dL — ABNORMAL HIGH (ref 8–23)
CO2: 25 mmol/L (ref 22–32)
Calcium: 7.9 mg/dL — ABNORMAL LOW (ref 8.9–10.3)
Chloride: 109 mmol/L (ref 98–111)
Creatinine, Ser: 1.3 mg/dL — ABNORMAL HIGH (ref 0.61–1.24)
GFR, Estimated: >60 mL/min (ref >60)
Glucose, Bld: 94 mg/dL (ref 70–99)
Potassium: 4.1 mmol/L (ref 3.5–5.1)
Sodium: 144 mmol/L (ref 135–145)
Total Bilirubin: 0.8 mg/dL (ref 0.3–1.2)
Total Protein: 5.7 g/dL — ABNORMAL LOW (ref 6.5–8.1)

## 2022-01-05 LAB — ECHOCARDIOGRAM LIMITED
Height: 71.5 in
Weight: 3248.7 oz

## 2022-01-05 LAB — CBC WITH DIFFERENTIAL/PLATELET
Abs Immature Granulocytes: 0.06 10*3/uL (ref 0.00–0.07)
Basophils Absolute: 0.1 10*3/uL (ref 0.0–0.1)
Basophils Relative: 1 %
Eosinophils Absolute: 0.3 10*3/uL (ref 0.0–0.5)
Eosinophils Relative: 3 %
HCT: 30.6 % — ABNORMAL LOW (ref 39.0–52.0)
Hemoglobin: 9.2 g/dL — ABNORMAL LOW (ref 13.0–17.0)
Immature Granulocytes: 1 %
Lymphocytes Relative: 16 %
Lymphs Abs: 1.2 10*3/uL (ref 0.7–4.0)
MCH: 29.7 pg (ref 26.0–34.0)
MCHC: 30.1 g/dL (ref 30.0–36.0)
MCV: 98.7 fL (ref 80.0–100.0)
Monocytes Absolute: 0.8 10*3/uL (ref 0.1–1.0)
Monocytes Relative: 10 %
Neutro Abs: 5.3 10*3/uL (ref 1.7–7.7)
Neutrophils Relative %: 69 %
Platelets: 314 10*3/uL (ref 150–400)
RBC: 3.1 MIL/uL — ABNORMAL LOW (ref 4.22–5.81)
RDW: 15.3 % (ref 11.5–15.5)
WBC: 7.7 10*3/uL (ref 4.0–10.5)
nRBC: 0 % (ref 0.0–0.2)

## 2022-01-05 LAB — GLUCOSE, CAPILLARY
Glucose-Capillary: 126 mg/dL — ABNORMAL HIGH (ref 70–99)
Glucose-Capillary: 78 mg/dL (ref 70–99)
Glucose-Capillary: 89 mg/dL (ref 70–99)
Glucose-Capillary: 93 mg/dL (ref 70–99)
Glucose-Capillary: 94 mg/dL (ref 70–99)
Glucose-Capillary: 95 mg/dL (ref 70–99)

## 2022-01-05 LAB — PHOSPHORUS: Phosphorus: 5.5 mg/dL — ABNORMAL HIGH (ref 2.5–4.6)

## 2022-01-05 LAB — MAGNESIUM: Magnesium: 2.4 mg/dL (ref 1.7–2.4)

## 2022-01-05 SURGERY — INSERTION, PEG TUBE
Anesthesia: Moderate Sedation

## 2022-01-05 MED ORDER — MIDAZOLAM HCL (PF) 5 MG/ML IJ SOLN
10.0000 mg | Freq: Once | INTRAMUSCULAR | Status: AC
  Administered 2022-01-05: 10 mg via INTRAVENOUS
  Filled 2022-01-05: qty 2

## 2022-01-05 MED ORDER — FENTANYL CITRATE PF 50 MCG/ML IJ SOSY
100.0000 ug | PREFILLED_SYRINGE | Freq: Once | INTRAMUSCULAR | Status: DC
  Filled 2022-01-05: qty 2

## 2022-01-05 SURGICAL SUPPLY — 1 items: PEG (Peg) ×1 IMPLANT

## 2022-01-05 NOTE — Progress Notes (Signed)
? ?NAME:  Alyssa Deutschman, MRN:  [id number], DOB:  1961/06/02, LOS: 40 ?ADMISSION DATE:  12/25/2021, CONSULTATION DATE:  12/25/2021 ?REFERRING MD:  Dr. Estanislado Pandy, CHIEF COMPLAINT:  left sided weakness  ? ?History of Present Illness:  ?HPI mostly obtained from medical chart review.  Attempted from patient but limited given aphasia.  ? ?61 year old male with prior history of HTN, HLD, and GERD who presented to ER as code stroke.  Patient was at the vet's office when he acutely developed left sided weakness and right gaze deviation at 0945.  Code stroke activated by EMS in addition to code STEMI based on diffuse STE.  Of note, patient recently diagnosed with RLL bronchopneumonia two days ago by his PCP.   ? ?He was evaluated by Neurology and Cardiology in ER, HR in 150s with no chest pain or dyspnea, with EKG not meeting criteria for STEMI but showed mild STE, felt possibly rate related versus possible pericarditis.  Echo and cardiac workup ordered.  CT head showed concern for subacute infarcts in the R PCA territory and two small areas possibly representing calcification or blood, therefore thrombolytics deferred.  CTA head/ neck and perfusion study showed acute right MCA anterior M2 occlusion.   ?Labs significant for WBC 20.6, Hgb 9.2, Hct 26.8, plts 205, normal coags, K 4.9, bicarb 21, glucose 138, BUN 34, sCr 1.83, iCa 1.03, low albumin/ protein, lactic acid 1.5. Trop hs pending.  CXR showing elevation of right hemidiaphragm with pulmonary vascular congestion.  Blood cultures sent.   Temp 100 in ER, remains ST, and initial BP 140/90, and remains on room air > 94%.  He was taken emergently to Neuro IR for mechanical thrombectomy with complete revascularization achieving TICI 3 revascularization.  Patient was extubated post intervention.  Briefly required phenylephrine in PACU.  Received 2L LR and albumin in procedure.  PCCM consulted for further medical assistance given concern for sepsis and possible cardioembolic  event related to stroke. ? ?Patient is currently only able to nod yes/ no to question given expressive aphasia and mostly incomprehensible speech.  Nodded yes to recent fever, cough with productive sputum, and headache.  Nods yes to recent bloody stools.  Did indicate that he started antibiotics from PCP.  Denied current or previous SOB, CP, palpitations, N/V/abd pain, lower leg swelling.   Patient indicates he lives alone, not married, no children, and has living parents out of state.  Non smoker, occasional but not daily ETOH, denies drug abuse.  ? ?Pertinent  Medical History  ?HLT, HLD, GERD, rising PSA level, impaired fasting glucose ? 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  Cards consulted for initial code STEMI with diffuse STE, no STEMI. Required 4 units of pRBCs for lower GI bleed.  ?4/17: Large bloody BM. Bedside colonoscopy with blood throughout colon. 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 Hemoglobin decreased to 6.8. Additional 2 units of pRBCs and FFP given.  ?4/18: Bedside EGD and colonoscopy with evidence of black blood but no source of bleeding identified. MRI of the prostate demonstrated large abscess. Patient taken for drainage by Urology. Cultures obtained.  ?4/19: Propofol switched to Precedex in anticipation of extubation.  ?4/20: Increased agitation with worsening oxygen requirements overnight. Required switch back to Propofol. CXR with atelectasis +/- hypervolemia. Lasix given. TCTS consulted for concern of valvular insufficiency. Repeat echo with increased peri-cardial effusion size.   ?  4/22 no significant issues overnight, remains on propofol infusion due to agitation and anxiety  ?4/23 No major issues overnight, MRI brian with expected stroke evolution. On assessment this right knee is red and warm to touch   ?4/24 trach ?4/25 PEG planned ? ?Interim History / Subjective:  ?Trach yesterday, tolerated well. ?Remains on propofol today. PEG planned for today.  Have asked RN to wean off propofol after PEG. ? ?Objective   ?Blood pressure 112/71, pulse 93, temperature 98.6 ?F (37 ?C), temperature source Axillary, resp. rate 20, height 5' 11.5" (1.816 m), weight 92.1 kg, SpO2 100 %. ?   ?Vent Mode: PRVC ?FiO2 (%):  [30 %-40 %] 40 % ?Set Rate:  [20 bmp] 20 bmp ?Vt Set:  [600 mL] 600 mL ?PEEP:  [8 cmH20] 8 cmH20 ?Pressure Support:  [8 cmH20] 8 cmH20 ?Plateau Pressure:  [17 cmH20-20 cmH20] 17 cmH20  ? ?Intake/Output Summary (Last 24 hours) at 01/05/2022 0835 ?Last data filed at 01/05/2022 0700 ?Gross per 24 hour  ?Intake 2344.45 ml  ?Output 2800 ml  ?Net -455.55 ml  ? ? ?Filed Weights  ? 12/25/21 1000 12/27/21 0500 01/03/22 0400  ?Weight: 100.8 kg 100.6 kg 92.1 kg  ? ?Examination:  ?General: Adult male, sedated, in NAD. ?Neuro: Sedated, not responsive. ?HEENT: [place]/AT. Sclerae anicteric. Trach C/D/I. ?Cardiovascular: RRR, no M/R/G.  ?Lungs: Respirations even and unlabored.  CTA bilaterally, No W/R/R. ?Abdomen: BS x 4, soft, NT/ND.  ?Musculoskeletal: No gross deformities, no edema.  ?Skin: Intact, warm, no rashes. ? ? ?Resolved Hospital Problem list   ?Septic Shock, resolved  ?Ileus/partial small bowel obstruction ? ?Assessment & Plan:  ? 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  ?-No significant stenosis or regurgitation on initial TEE.  ?- Blood cultures negative throughout admission, however on Doxycycline PTA ?- No definite source identified as of yet. It is possible prostate abscess is the source though however this would be extremely unusual. At this point, it is unlikely source will be identified  ?P: ?ID following, appreciate assistance  ?Continue Naifillin per ID  ?TCTS and Cardiology consulted, both state there  is no need for surgical intervention currently  ? ?Acute hypoxic respiratory failure - now s/p trach 4/24 ?Multi-focal pneumonia ?- Diagnosed with RLL pneumonia PTA on Doxycycline. CTA on admission with results concerning for multi-focal pneumonia involving bilateral upper and lower lobes. Completed 5 day course of CAP/aspiration coverage.  ?P: ?Start sedation and vent weaning once PEG has been completed, ultimately can try get him to ATC fairly soon. ?Ensure adequate pulmonary hygiene  ?Follow cultures  ?VAP bundle in place  ?PAD protocol ? ?MSSA Prostate Abscess ?- On admission CTA, area of lobulation within the inferolateral portion of the prostate. PSA markedly elevated at 25.7. Previously mildly elevated at 1.6. Due to this, Urology consulted with MRI prostate demonstrating large abscess. S/p transurethral resection on 4/18. Urine cultures growing MSSA ?- Urology has signed off. Will need outpatient follow up ?P: ?Continue Foley cath x2 weeks per urology, can consider voiding trial after  ?Antibiotics as above (Nafcillin) ? ?Acute R MCA, with M2 occlusion s/p mechanical thrombectomy with complete revascularization ?Bilateral Embolic Infarcts  ?- Given MV endocarditis and no prior history of atherosclerotic disease, suspect this to be secondary to septic emboli ?- Examination demonstrates persistent left-sided hemiplegia with fixed vertical gaze ?P: ?Primary management per neurology  ?Maintain neuro protective measures ?Nutrition and  bowel regiment  ?Seizure precautions  ?Continue secondary stoke prevention  ? ?Acute blood loss anemia s/p 6 units of pRBC transfusions.  Now resolved. ?Lower GI bleeding ?Gastritis 2/2 OG tube  ?- Patient endorsed blood stools PTA. Significant lower GI bleed during hospitalization.  ?- Bedside EGD on 4/18, and colonoscopy on 4/17, 4/18 with blood present however source not identified. Notable for gastritis secondary to OG tube but non-bleeding ?P: ?GI on standby if has recurrent  bleeding ?Continue PPI and avoid NSAIDs ?Trend CBC  ?Hgb goal > 7 ? ?Acute Kidney Injury - improved ?-Creatinine 1.83, GFR 42 on admit 4/14, creatinine peaked at 2.33, GFR 31 4/16.  5/22 creatinine normal

## 2022-01-05 NOTE — Progress Notes (Signed)
? ?[facility] Infectious Diseases Follow Up Note ? ?Patient Identification: ?Patient Name: Vincent Ferrell MRN: [id number] [place] Date: 12/25/2021 10:23 AM ?Age: 61 y.o.Today's Date: 01/05/2022 ? ? ?Reason for Visit: Follow up on endocarditis/fevers ? ?Principal Problem: ?  Endocarditis of mitral valve ?Active Problems: ?  Acute ischemic right MCA stroke ([place]) ?  Sinus tachycardia ?  Acute respiratory failure with hypoxia ([place]) ?  Prostate abscess ?  Acute kidney injury ([place]) ?  Multifocal pneumonia ?  Anemia due to GI blood loss ?  Pericardial effusion ? ? ?Antibiotics:  ?Nafcillin 4/21-c ?Amp sulbactam 4/15-4/17, ceftriaxone 4/17-4/20 ?Vancomycin 4/14, 4/15, 4/17-4/20 ?Cefazolin/ceftriaxone/azithromycin 4/14 ? ?Lines/Hardwares:  ? ?Interval Events: Intermittent low-grade fever ? ? 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 ?Acute RT ischemic CVA s/p mechanical thrombectomy with complete revascularization  ? ?Moderate pericardial effusion without tamponade - not a surgical candidate per TCTS as well as Cardiology  ? ?Acute hypoxic respiratory failure secondary to multifocal pneumonia and #1 - s/p Trach 4/24 and PEG planned today ? 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 ?AKI - Cr slightly up today to 1.3 ? ?Fevers - intermittent low grade fevers, low suspicion for new infection at this time. No central lines, no reported diarrhea, stable vent settings.  ? ? ?Recommendations ?Continue nafcillin infusion ?Monitor BMP while on Nafcillin  ?Monitor fever curve  ?Following ? ?Rest of the management as per the primary team. ?Thank you for the consult. Please page with pertinent  questions or concerns. ? ?______________________________________________________________________ ?Subjective ?patient seen and examined at the bedside. ?One family member at bedside ?Plan for PEG ? ?Vitals ?BP 112/71   Pulse 93   Temp 98.6 ?F (37 ?C) (Axillary)   Resp 20   Ht 5' 11.5" (1.816 m)   Wt 92.1 kg   SpO2 100%   BMI 27.92 kg/m?  ? ?  ?Physical Exam ?Constitutional: Lying in bed ?   Comments: On 70 fentanyl and 50 propofol, on tube feeds ? ?Cardiovascular:  ?   Rate and Rhythm: Normal rate and regular rhythm.  ?   Heart sounds:  ? ?Pulmonary:  ?   Effort: Pulmonary effort is normal on vent ?   Comments: Coarse breath sounds bilaterally ? ?Abdominal:  ?   Palpations: Abdomen is soft.  ?   Tenderness:  ? ?Musculoskeletal:     ?   General: No swelling or tenderness in peripheral joints. No signs of septic joint in rt knee  ? ?Skin: ?   Comments:  ? ?Neurological:  ?   General: unable to assess  ? ?Pertinent Microbiology ?Results for orders placed or performed during the hospital encounter of 12/25/21  ?Resp Panel by RT-PCR (Flu A&B, Covid) Nasopharyngeal Swab     Status: None  ? Collection Time: 12/25/21 11:14 AM  ? Specimen: Nasopharyngeal Swab; Nasopharyngeal(NP) swabs in vial transport medium  ?Result Value Ref Range Status  ? SARS Coronavirus 2 by RT PCR NEGATIVE NEGATIVE Final  ?  Comment: (NOTE) ?SARS-CoV-2 target nucleic acids are NOT DETECTED. ? ?The SARS-CoV-2 RNA is generally detectable in upper respiratory ?specimens during the acute phase of infection. The lowest ?concentration of SARS-CoV-2 viral copies this assay can detect is ?138 copies/mL. A negative result does not preclude SARS-Cov-2 ?infection and should not be used  as the sole basis for treatment or ?other patient management decisions. A negative result may occur with  ?improper specimen collection/handling, submission of specimen other ?than nasopharyngeal swab, presence of viral mutation(s) within the ?areas targeted by this assay,  and inadequate number of viral ?copies(<138 copies/mL). A negative result must be combined with ?clinical observations, patient history, and epidemiological ?information. The expected result is Negative. ? ?Fact Sheet for Patients:  ?EntrepreneurPulse.com.au ? ?Fact Sheet for Healthcare Providers:  ?IncredibleEmployment.be ? ?This test is no t yet approved or cleared by the [place] FDA and  ?has been authorized for detection and/or diagnosis of SARS-CoV-2 by ?FDA under an Emergency Use Authorization (EUA). This EUA will remain  ?in effect (meaning this test can be used) for the duration of the ?COVID-19 declaration under Section 564(b)(1) of the Act, 21 ?U.S.C.section 360bbb-3(b)(1), unless the authorization is terminated  ?or revoked sooner.  ? ? ?  ? Influenza A by PCR NEGATIVE NEGATIVE Final  ? Influenza B by PCR NEGATIVE NEGATIVE Final  ?  Comment: (NOTE) ?The Xpert Xpress SARS-CoV-2/FLU/RSV plus assay is intended as an aid ?in the diagnosis of influenza from Nasopharyngeal swab specimens and ?should not be used as a sole basis for treatment. Nasal washings and ?aspirates are unacceptable for Xpert Xpress SARS-CoV-2/FLU/RSV ?testing. ? ?Fact Sheet for Patients: ?EntrepreneurPulse.com.au ? ?Fact Sheet for Healthcare Providers: ?IncredibleEmployment.be ? ?This test is not yet approved or cleared by the [place] FDA and ?has been authorized for detection and/or diagnosis of SARS-CoV-2 by ?FDA under an Emergency Use Authorization (EUA). This EUA will remain ?in effect (meaning this test can be used) for the duration of the ?COVID-19 declaration under Section 564(b)(1) of the Act, 21 U.S.C. ?section 360bbb-3(b)(1), unless the authorization is terminated or ?revoked. ? ?Performed at [facility], [place] [address], [place], [place] ?[address] ?  ?Culture, blood (Routine X 2) w Reflex to ID Panel     Status: None  ? Collection Time:  12/25/21  1:15 PM  ? Specimen: BLOOD RIGHT HAND  ?Result Value Ref Range Status  ? Specimen Description BLOOD RIGHT HAND  Final  ? Special Requests   Final  ?  BOTTLES DRAWN AEROBIC AND ANAEROBIC Blood Culture adequate volume  ? Culture   Final  ?  NO GROWTH 5 DAYS ?Performed at [facility], [place] [address] [place], [place] [address] ?  ? Report Status 12/30/2021 FINAL  Final  ?Culture, blood (Routine X 2) w Reflex to ID Panel     Status: None  ? Collection Time: 12/25/21  1:26 PM  ? Specimen: BLOOD LEFT HAND  ?Result Value Ref Range Status  ? Specimen Description BLOOD LEFT HAND  Final  ? Special Requests   Final  ?  BOTTLES DRAWN AEROBIC AND ANAEROBIC Blood Culture results may not be optimal due to an inadequate volume of blood received in culture bottles  ? Culture   Final  ?  NO GROWTH 5 DAYS ?Performed at [facility], [place] [address] [place], [place] [address] ?  ? Report Status 12/30/2021 FINAL  Final  ?MRSA Next Gen by PCR, Nasal     Status: None  ? Collection Time: 12/25/21  3:52 PM  ? Specimen: Nasal Mucosa; Nasal Swab  ?Result Value Ref Range Status  ? MRSA by PCR Next Gen NOT DETECTED NOT DETECTED Final  ?  Comment: (NOTE) ?The GeneXpert MRSA Assay (FDA approved for NASAL specimens only), ?is one component of a comprehensive  MRSA colonization surveillance ?program. It is not intended to diagnose MRSA infection nor to guide ?or monitor treatment for MRSA infections. ?Test performance is not FDA approved in patients less than 2 years ?old. ?Performed at [facility], [place] [address] [place], [place] ?[address] ?  ?Culture, blood (Routine X 2) w Reflex to ID Panel     Status: None  ? Collection Time: 12/28/21  4:35 PM  ? Specimen: BLOOD LEFT HAND  ?Result Value Ref Range Status  ? Specimen Description BLOOD LEFT HAND  Final  ? Special Requests   Final  ?  BOTTLES DRAWN AEROBIC AND ANAEROBIC Blood Culture adequate volume  ? Culture   Final  ?  NO GROWTH 5 DAYS ?Performed at [facility], [place] [address] [place], [place] [address] ?  ? Report Status 01/02/2022 FINAL  Final  ?Culture, blood (Routine X 2) w Reflex to ID Panel     Status: None  ? Collection Time: 12/28/21  4:42 PM  ? Specimen: BLO

## 2022-01-05 NOTE — Progress Notes (Signed)
PT Cancellation Note ? ?Patient Details ?Name: Vincent Ferrell ?MRN: [id number] ?DOB: 1961-08-11 ? ? ?Cancelled Treatment:    Reason Eval/Treat Not Completed: Medical issues which prohibited therapy; patient still on sedation until after PEG which is planned for today.  Will attempt again another day. ? ? ?Elray Mcgregor ?01/05/2022, 1:22 PM ?Sheran Lawless, PT ?Acute Rehabilitation Services ?[phone] ?Office:[phone] ?01/05/2022 ? ?

## 2022-01-05 NOTE — Procedures (Signed)
? ?  Procedure Note ? ?Date: 01/05/2022 ? ?Procedure: esophagogastroduodenoscopy (EGD) and percutaneous endoscopic gastrostomy (PEG) tube placement ? ?Pre-op diagnosis: dysphagia ?Post-op diagnosis: same ? ?Indication and clinical history: 57M s/p stroke with dysphagia ? ?Surgeon: Jesusita Oka, MD ?Assistant: Maxwell Caul, [place] ? ?Anesthesia: MAC ? ?Findings: normal esophagus, stomach, duodenum ?Specimen: none ?EBL: <5cc ?Drains/Implants: PEG tube, 3cm at the skin  ? ?Disposition: ICU ? ?Description of Procedure: The patient was positioned semi-recumbent. Time-out was performed verifying correct patient, procedure, signature of informed consent, and pre-operative antibiotics as indicated. MAC induction was uneventful and a bite block was placed into the oropharynx. The endoscope was inserted into the oropharynx and advanced down the esophagus into the stomach and into the duodenum. The visualized esophagus and duodenum were unremarkable. The endoscope was retracted back into the stomach and the stomach was insufflated. The stomach was inspected and was also normal. Transillumination was performed. The light was visible on the external skin and dimpling of the stomach was noted endoscopically with manual pressure. The abdomen was prepped and draped in the usual sterile fashion. Transillumination and dimpling were repeated and local anesthetic was infiltrated to make a skin wheal at the site of transillumination. The needle was inserted perpendicularly to the skin and the tip of the needle was visualized endoscopically. As the needle was retracted, the tract was also anesthetized. A skin nick was made at the site of the wheal and an introducer needle and sheath were inserted. The needle was removed and guidewire inserted. The guidewire was grasped by an endoscopic snare and the snare, guidewire, and endoscope retracted out of the oropharynx. The PEG tube was secured to the guidewire and retracted through the mouth and  esophagus into the stomach. The PEG tube was secured with a bolster and was visualized endoscopically to spin freely circumferentially and also be without gaps between the internal bumper and the stomach wall. There was no evidence of bleeding. The PEG bolster was secured at 3cm at the skin and there were no gaps between the bolster and the abdominal wall. The stomach was desufflated endoscopically and the endoscope removed. The bite block was also removed. The patient tolerated the procedure well and there were no complications.  ? ?The patient may have water and medications administered via the PEG tube beginning immediately and tube feeds may be initiated four hours post-procedure.  ? ? ?Jesusita Oka, MD ?General and Trauma Surgery ?[facility] ? ?

## 2022-01-05 NOTE — Assessment & Plan Note (Signed)
No signs of bleeding with stable hemoglobin ?
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
## 2022-01-05 NOTE — Progress Notes (Addendum)
STROKE TEAM PROGRESS NOTE  ? ?SUBJECTIVE (INTERVAL HISTORY) ?Patient is seen in his room with one family member at the bedside.  He has had a tracheostomy placed yesterday and appears more comfortable.  Patient will have a PEG tube placed soon.  He remains sedated and intubated for respiratory failure.  His wife is at the bedside.  Vital signs stable.  Neurological exam limited due to sedation ? ?OBJECTIVE ?Temp:  [98.6 ?F (37 ?C)-101.4 ?F (38.6 ?C)] 98.6 ?F (37 ?C) (04/25 0730) ?Pulse Rate:  [83-106] 89 (04/25 0900) ?Cardiac Rhythm: Normal sinus rhythm;Sinus tachycardia (04/24 2000) ?Resp:  [18-32] 20 (04/25 1100) ?BP: (97-133)/(59-80) 113/71 (04/25 1100) ?SpO2:  [94 %-100 %] 100 % (04/25 0900) ?FiO2 (%):  [30 %-40 %] 40 % (04/25 0745) ? ?Recent Labs  ?Lab 01/04/22 ?1616 01/04/22 ?1952 01/04/22 ?2337 01/05/22 ?0359 01/05/22 ?0820  ?GLUCAP 117* 108* 161* 89 95  ? ? ?Recent Labs  ?Lab 01/01/22 ?0309 01/02/22 ?0208 01/03/22 ?1020 01/04/22 ?0801 01/05/22 ?0423  ?NA 145 146* 146* 148* 144  ?K 4.2 4.7 3.8 4.2 4.1  ?CL 117* 115* 111 112* 109  ?CO2 23 25 27 29 25   ?GLUCOSE 138* 120* 145* 135* 94  ?BUN 29* 27* 28* 30* 34*  ?CREATININE 1.40* 1.20 1.18 1.25* 1.30*  ?CALCIUM 7.7* 7.8* 8.0* 8.1* 7.9*  ?MG  --   --   --   --  2.4  ?PHOS  --   --   --   --  5.5*  ? ? ?Recent Labs  ?Lab 01/01/22 ?0309 01/05/22 ?0423  ?AST 40 42*  ?ALT 35 30  ?ALKPHOS 57 68  ?BILITOT 0.5 0.8  ?PROT 5.2* 5.7*  ?ALBUMIN 1.8* 1.9*  ? ? ?Recent Labs  ?Lab 01/01/22 ?0309 01/02/22 ?0208 01/03/22 ?1020 01/04/22 ?0801 01/05/22 ?0423  ?WBC 11.2* 10.4 7.0 7.5 7.7  ?NEUTROABS 9.3* 7.3  --   --  5.3  ?HGB 9.2* 9.1* 9.0* 9.7* 9.2*  ?HCT 30.0* 29.6* 28.4* 31.8* 30.6*  ?MCV 98.0 97.4 94.7 96.4 98.7  ?PLT 448* 395 405* 403* 314  ? ? ?No results for input(s): CKTOTAL, CKMB, CKMBINDEX, TROPONINI in the last 168 hours. ?Recent Labs  ?  01/04/22 ?0801  ?LABPROT 14.1  ?INR 1.1  ? ? ? ?No results for input(s): COLORURINE, LABSPEC, PHURINE, GLUCOSEU, HGBUR, BILIRUBINUR,  KETONESUR, PROTEINUR, UROBILINOGEN, NITRITE, LEUKOCYTESUR in the last 72 hours. ? ?Invalid input(s): APPERANCEUR ?  ?   ?Component Value Date/Time  ? CHOL 89 12/26/2021 0230  ? TRIG 92 01/04/2022 0447  ? HDL <10 (L) 12/26/2021 0230  ? CHOLHDL NOT CALCULATED 12/26/2021 0230  ? VLDL 43 (H) 12/26/2021 0230  ? LDLCALC NOT CALCULATED 12/26/2021 0230  ? ?Lab Results  ?Component Value Date  ? HGBA1C 6.0 (H) 12/25/2021  ? ?   ?Component Value Date/Time  ? LABOPIA NONE DETECTED 12/26/2021 0116  ? COCAINSCRNUR NONE DETECTED 12/26/2021 0116  ? LABBENZ NONE DETECTED 12/26/2021 0116  ? AMPHETMU NONE DETECTED 12/26/2021 0116  ? THCU NONE DETECTED 12/26/2021 0116  ? LABBARB NONE DETECTED 12/26/2021 0116  ?  ?No results for input(s): ETH in the last 168 hours. ? ?I have personally reviewed the radiological images below and agree with the radiology interpretations. ? ?DG Knee 1-2 Views Right ? ?Result Date: 01/03/2022 ?CLINICAL DATA:  Right knee red and warm to touch. EXAM: RIGHT KNEE - 1-2 VIEW COMPARISON:  None. FINDINGS: Mild superior greater than inferior patellar degenerative osteophytes. Tiny joint effusion. Mild chronic enthesopathic change at the  quadriceps insertion on the patella. No acute fracture or dislocation. IMPRESSION: Tiny joint effusion.  No acute fracture. Electronically Signed   By: Neita Garnet M.D.   On: 01/03/2022 16:17  ? ?DG Abd 1 View ? ?Result Date: 12/25/2021 ?CLINICAL DATA:  Orogastric tube placement. EXAM: ABDOMEN - 1 VIEW COMPARISON:  None. FINDINGS: An orogastric tube is seen with its distal tip overlying the expected region of the gastric antrum. The bowel gas pattern is normal. No radio-opaque calculi or other significant radiographic abnormality are seen. IMPRESSION: Orogastric tube positioning, as described above. Electronically Signed   By: Aram Candela M.D.   On: 12/25/2021 19:09  ? ?DG Abd 1 View ? ?Result Date: 12/25/2021 ?CLINICAL DATA:  OG tube placement. EXAM: ABDOMEN - 1 VIEW  COMPARISON:  One view chest x-ray 12:45 p.m. FINDINGS: Gaseous distention of the stomach noted. Bowel gas pattern otherwise unremarkable. OG tube is not visualized over the distal esophagus or stomach. IMPRESSION: OG tube is not visualized over the distal esophagus or stomach. Gaseous distention of the stomach. Electronically Signed   By: Marin Roberts M.D.   On: 12/25/2021 19:06  ? ?MR ANGIO HEAD WO CONTRAST ? ?Result Date: 12/26/2021 ?CLINICAL DATA:  Acute infarct. Revascularization of superior right M2 segment. EXAM: MRA HEAD WITHOUT CONTRAST TECHNIQUE: Angiographic images of the Circle of Willis were acquired using MRA technique without intravenous contrast. COMPARISON:  CTA head and neck 12/25/2021.  Revascularization films. FINDINGS: Anterior circulation: The internal carotid arteries are within normal limits from high cervical segments through the ICA termini bilaterally. The A1 and M1 segments are normal. MCA bifurcations are within normal limits. MCA bifurcations are intact. The revascularized superior right M2 segments remain patent. ACA and MCA branch vessels are normal bilaterally. Posterior circulation: The vertebral arteries are codominant. AICA vessels are dominant. The basilar artery is within normal limits. Right posterior cerebral artery originates from basilar tip. Left posterior cerebral artery is of fetal type. A left P1 segment contributes. PCA branch vessels are within normal limits bilaterally. Anatomic variants: None Other: None. IMPRESSION: 1. Normal variant MRA Circle of Willis without evidence for significant proximal stenosis, aneurysm, or branch vessel occlusion. 2. The revascularized superior right M2 segments remain patent. Electronically Signed   By: Marin Roberts M.D.   On: 12/26/2021 16:54  ? ?MR BRAIN WO CONTRAST ? ?Result Date: 01/03/2022 ?CLINICAL DATA:  Stroke follow-up EXAM: MRI HEAD WITHOUT CONTRAST TECHNIQUE: Multiplanar, multiecho pulse sequences of the brain  and surrounding structures were obtained without intravenous contrast. COMPARISON:  12/26/2021 FINDINGS: Brain: Expected evolution of diffusion abnormalities in the right MCA and right PCA territories. Areas of diffusion restriction in the left frontal and parietal white matter of also undergone expected changes. Petechial hemorrhage in the left parietal lesion is slightly diminished. No midline shift or other mass effect. Normal white matter signal, parenchymal volume and CSF spaces. The midline structures are normal. Vascular: Major flow voids are preserved. Skull and upper cervical spine: Normal calvarium and skull base. Visualized upper cervical spine and soft tissues are normal. Sinuses/Orbits:Mastoid effusions. Paranasal sinuses are clear. Normal orbits. IMPRESSION: 1. Expected evolution of subacute ischemia in the right MCA territory, right occipital lobe and left frontal and parietal white matter. 2. Petechial blood in the left parietal lobe. Heidelberg classification 1a: HI1, scattered small petechiae, no mass effect. Electronically Signed   By: Deatra Robinson M.D.   On: 01/03/2022 03:40  ? ?MR BRAIN WO CONTRAST ? ?Result Date: 12/26/2021 ?CLINICAL DATA:  Status post  revascularization of right superior M2 occlusion. EXAM: MRI HEAD WITHOUT CONTRAST TECHNIQUE: Multiplanar, multiecho pulse sequences of the brain and surrounding structures were obtained without intravenous contrast. COMPARISON:  CTA head and neck, CT perfusion, and revascularization images 12/25/21 FINDINGS: Brain: Diffusion-weighted images demonstrate acute hemorrhage within the superior right MCA division involving the right insular cortex and frontal lobe. Diffuse cortical infarct involves the high right parietal cortex. Sparing of the more inferior parietal lobe and superior temporal lobe noted. Focal area of nonhemorrhagic infarct is present in the right occipital pole. Contralateral left acute parietal and posterior left frontal lobe  cortical infarcts are present as well. T2 hyperintensities are associated with the areas of acute/subacute infarction. Focal susceptibility involving the left parietal infarct noted. Additional areas of cortical hemorr

## 2022-01-05 NOTE — Assessment & Plan Note (Signed)
Creatinine is stable at 1.39 ?

## 2022-01-06 ENCOUNTER — Encounter (HOSPITAL_COMMUNITY): Payer: Self-pay | Admitting: Surgery

## 2022-01-06 ENCOUNTER — Inpatient Hospital Stay (HOSPITAL_COMMUNITY): Payer: Managed Care, Other (non HMO)

## 2022-01-06 DIAGNOSIS — I059 Rheumatic mitral valve disease, unspecified: Secondary | ICD-10-CM | POA: Diagnosis not present

## 2022-01-06 LAB — GLUCOSE, CAPILLARY
Glucose-Capillary: 113 mg/dL — ABNORMAL HIGH (ref 70–99)
Glucose-Capillary: 125 mg/dL — ABNORMAL HIGH (ref 70–99)
Glucose-Capillary: 128 mg/dL — ABNORMAL HIGH (ref 70–99)
Glucose-Capillary: 129 mg/dL — ABNORMAL HIGH (ref 70–99)
Glucose-Capillary: 138 mg/dL — ABNORMAL HIGH (ref 70–99)
Glucose-Capillary: 187 mg/dL — ABNORMAL HIGH (ref 70–99)

## 2022-01-06 LAB — CBC
HCT: 29.2 % — ABNORMAL LOW (ref 39.0–52.0)
Hemoglobin: 8.9 g/dL — ABNORMAL LOW (ref 13.0–17.0)
MCH: 29.7 pg (ref 26.0–34.0)
MCHC: 30.5 g/dL (ref 30.0–36.0)
MCV: 97.3 fL (ref 80.0–100.0)
Platelets: 308 10*3/uL (ref 150–400)
RBC: 3 MIL/uL — ABNORMAL LOW (ref 4.22–5.81)
RDW: 14.9 % (ref 11.5–15.5)
WBC: 6.2 10*3/uL (ref 4.0–10.5)
nRBC: 0 % (ref 0.0–0.2)

## 2022-01-06 LAB — BASIC METABOLIC PANEL
Anion gap: 6 (ref 5–15)
BUN: 34 mg/dL — ABNORMAL HIGH (ref 8–23)
CO2: 28 mmol/L (ref 22–32)
Calcium: 8 mg/dL — ABNORMAL LOW (ref 8.9–10.3)
Chloride: 113 mmol/L — ABNORMAL HIGH (ref 98–111)
Creatinine, Ser: 1.23 mg/dL (ref 0.61–1.24)
GFR, Estimated: >60 mL/min (ref >60)
Glucose, Bld: 119 mg/dL — ABNORMAL HIGH (ref 70–99)
Potassium: 3.7 mmol/L (ref 3.5–5.1)
Sodium: 147 mmol/L — ABNORMAL HIGH (ref 135–145)

## 2022-01-06 LAB — PHOSPHORUS: Phosphorus: 5 mg/dL — ABNORMAL HIGH (ref 2.5–4.6)

## 2022-01-06 LAB — MAGNESIUM: Magnesium: 2.5 mg/dL — ABNORMAL HIGH (ref 1.7–2.4)

## 2022-01-06 IMAGING — DX DG CHEST 1V PORT
1 series · 1 of 1 positions shown · non-contrast
Comparison: [DATE].

CLINICAL DATA: A 61-year-old male presents for evaluation of
respiratory failure.

EXAM:
PORTABLE CHEST 1 VIEW

[chest]
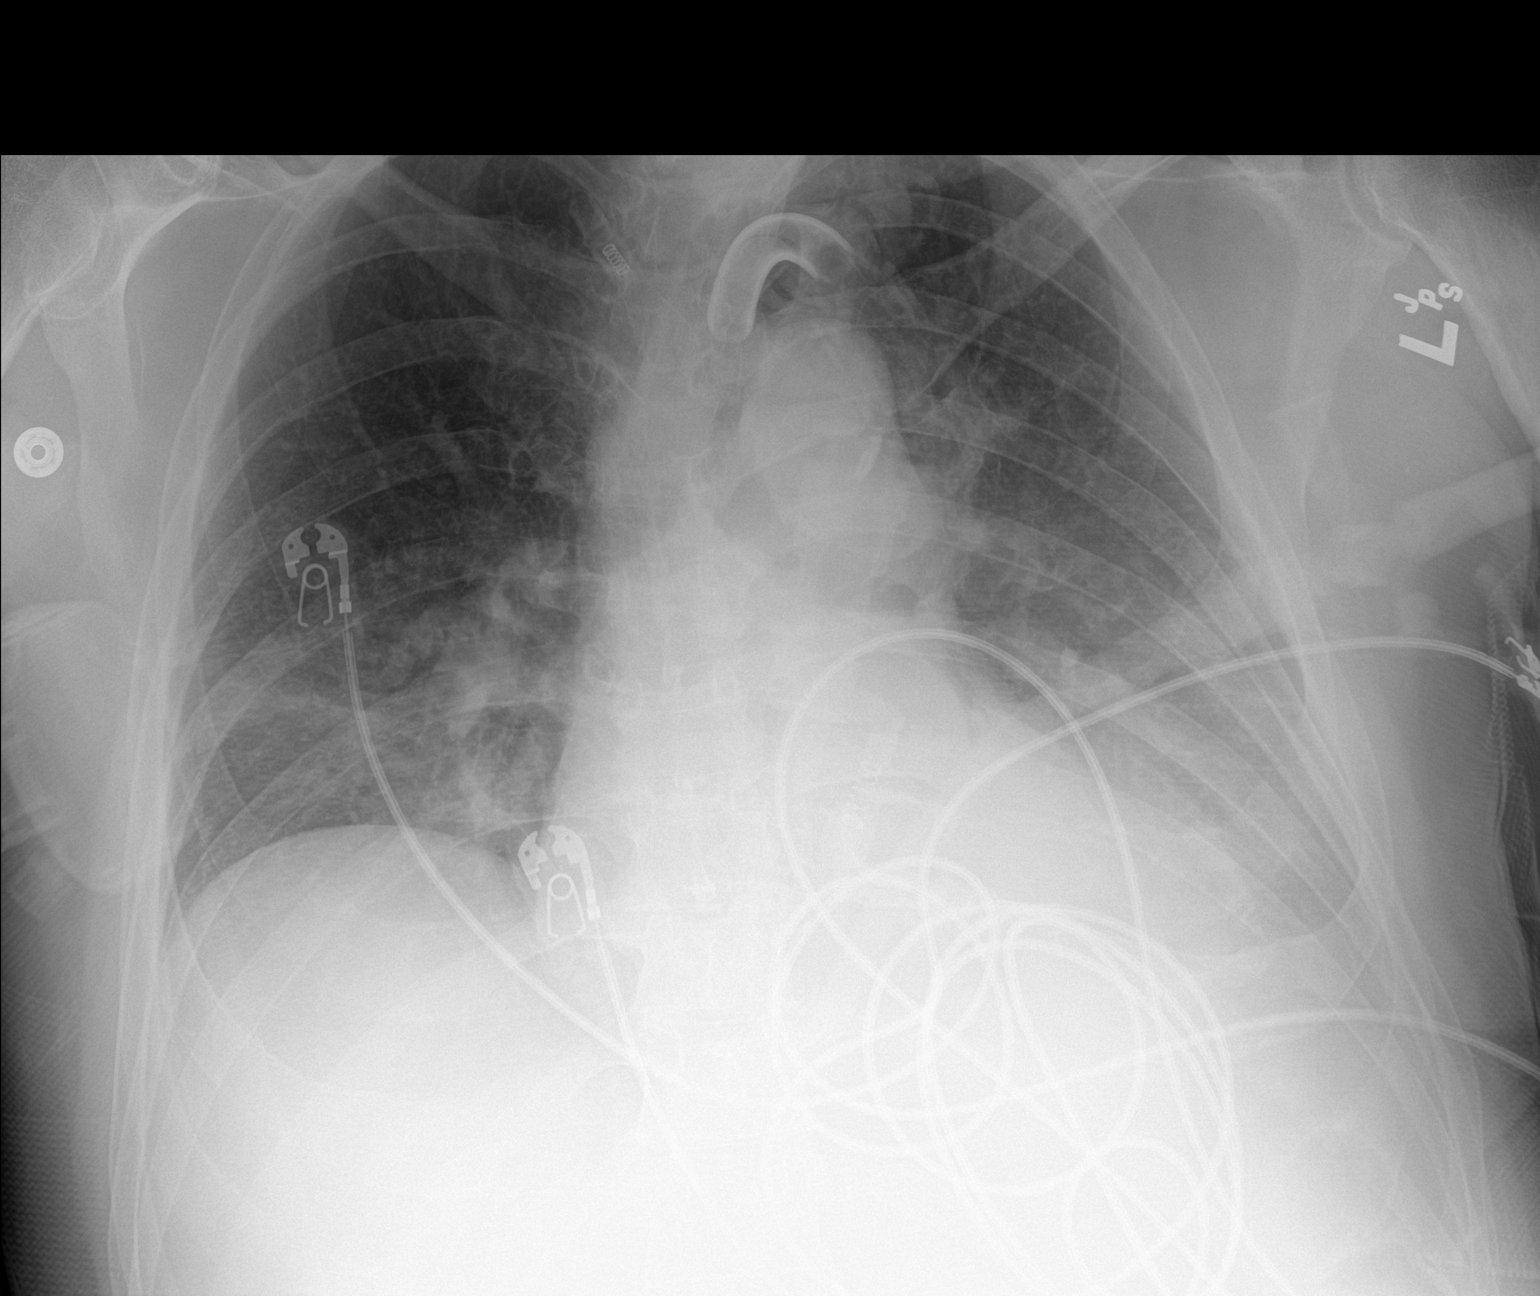

[1 of 1 positions shown; findings below may reference images not displayed]

FINDINGS: Endotracheal tube is been removed. Tracheostomy tube is been placed.
Tip between clavicular heads approximately 4 cm from the carina.

EKG leads are seen about the patient's chest. Feeding tube no longer
visible.

Cardiomediastinal contours and hilar structures are stable. Graded
opacity at the LEFT lung base and obscured LEFT hemidiaphragm. RIGHT
basilar airspace disease similar to prior imaging. No visible
pneumothorax.

On limited assessment there is no acute skeletal process.
IMPRESSION: 1. Interval placement of tracheostomy tube.
2. Post removal of endotracheal tube and feeding tube.
3. LEFT effusion and basilar airspace disease potentially increased
with stable appearance of RIGHT-sided basilar airspace disease.

## 2022-01-06 MED ORDER — POLYETHYLENE GLYCOL 3350 17 G PO PACK: 17.0000 g | PACK | Freq: Every day | ORAL | Status: DC

## 2022-01-06 MED ORDER — FENTANYL CITRATE PF 50 MCG/ML IJ SOSY: 25.0000 ug | PREFILLED_SYRINGE | INTRAMUSCULAR | Status: DC | PRN

## 2022-01-06 MED ORDER — DOCUSATE SODIUM 50 MG/5ML PO LIQD: 100.0000 mg | Freq: Two times a day (BID) | ORAL | Status: DC

## 2022-01-06 MED ORDER — FREE WATER
200.0000 mL | Status: DC
Start: 2022-01-06 — End: 2022-01-11
  Administered 2022-01-06 – 2022-01-11 (×30): 200 mL

## 2022-01-06 MED ORDER — POTASSIUM CHLORIDE 20 MEQ PO PACK
40.0000 meq | PACK | Freq: Once | ORAL | Status: AC
  Administered 2022-01-06: 40 meq

## 2022-01-06 NOTE — Progress Notes (Addendum)
? ?NAME:  Vincent GowdaDavid Hopfer, MRN:  [id number], DOB:  27-Jul-1961, LOS: 12 ?ADMISSION DATE:  12/25/2021, CONSULTATION DATE:  12/25/2021 ?REFERRING MD:  Dr. Corliss Skainseveshwar, CHIEF COMPLAINT:  left sided weakness  ? ?History of Present Illness:  ?HPI mostly obtained from medical chart review.  Attempted from patient but limited given aphasia.  ? ?61 year old male with prior history of HTN, HLD, and GERD who presented to ER as code stroke.  Patient was at the vet's office when he acutely developed left sided weakness and right gaze deviation at 0945.  Code stroke activated by EMS in addition to code STEMI based on diffuse STE.  Of note, patient recently diagnosed with RLL bronchopneumonia two days ago by his PCP.   ? ?He was evaluated by Neurology and Cardiology in ER, HR in 150s with no chest pain or dyspnea, with EKG not meeting criteria for STEMI but showed mild STE, felt possibly rate related versus possible pericarditis.  Echo and cardiac workup ordered.  CT head showed concern for subacute infarcts in the R PCA territory and two small areas possibly representing calcification or blood, therefore thrombolytics deferred.  CTA head/ neck and perfusion study showed acute right MCA anterior M2 occlusion.   ?Labs significant for WBC 20.6, Hgb 9.2, Hct 26.8, plts 205, normal coags, K 4.9, bicarb 21, glucose 138, BUN 34, sCr 1.83, iCa 1.03, low albumin/ protein, lactic acid 1.5. Trop hs pending.  CXR showing elevation of right hemidiaphragm with pulmonary vascular congestion.  Blood cultures sent.   Temp 100 in ER, remains ST, and initial BP 140/90, and remains on room air > 94%.  He was taken emergently to Neuro IR for mechanical thrombectomy with complete revascularization achieving TICI 3 revascularization.  Patient was extubated post intervention.  Briefly required phenylephrine in PACU.  Received 2L LR and albumin in procedure.  PCCM consulted for further medical assistance given concern for sepsis and possible cardioembolic  event related to stroke. ? ?Patient is currently only able to nod yes/ no to question given expressive aphasia and mostly incomprehensible speech.  Nodded yes to recent fever, cough with productive sputum, and headache.  Nods yes to recent bloody stools.  Did indicate that he started antibiotics from PCP.  Denied current or previous SOB, CP, palpitations, N/V/abd pain, lower leg swelling.   Patient indicates he lives alone, not married, no children, and has living parents out of state.  Non smoker, occasional but not daily ETOH, denies drug abuse.  ? ?Pertinent  Medical History  ?HLT, HLD, GERD, rising PSA level, impaired fasting glucose ? 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  Cards consulted for initial code STEMI with diffuse STE, no STEMI. Required 4 units of pRBCs for lower GI bleed.  ?4/17: Large bloody BM. Bedside colonoscopy with blood throughout colon.  TEE with 1 cm x 0.5 cm vegetation involving both atrial and ventricular sides. Hemoglobin decreased to 6.8. Additional 2 units of pRBCs and FFP given.  ?4/18: Bedside EGD and colonoscopy with evidence of black blood but no source of bleeding identified. MRI of the prostate demonstrated large abscess. Patient taken for drainage by Urology. Cultures obtained.  ?4/19: Propofol switched to Precedex in anticipation of extubation.  ?4/20: Increased agitation with worsening oxygen requirements overnight. Required switch back to Propofol. CXR with atelectasis +/- hypervolemia. Lasix given. TCTS consulted for concern of valvular  insufficiency. Repeat echo with increased peri-cardial effusion size.   ?4/22 no significant issues overnight, remains on propofol infusion due to agitation and anxiety  ?4/23 No major issues overnight, MRI brian with expected stroke evolution. On assessment this  right knee is red and warm to touch  ?4/24 trach ?4/25 PEG ?4/26 stopping heavy sedation looking good on PSV  ? ?Interim History / Subjective:  ?Sedated  ? ?Objective   ?Blood pressure 123/74, pulse 96, temperature (Abnormal) 100.5 ?F (38.1 ?C), temperature source Axillary, resp. rate (Abnormal) 22, height 5' 11.5" (1.816 m), weight 92.1 kg, SpO2 98 %. ?   ?Vent Mode: PSV;CPAP ?FiO2 (%):  [40 %] 40 % ?Set Rate:  [20 bmp] 20 bmp ?Vt Set:  [600 mL] 600 mL ?PEEP:  [5 cmH20] 5 cmH20 ?Pressure Support:  [5 cmH20] 5 cmH20 ?Plateau Pressure:  [13 cmH20-15 cmH20] 15 cmH20  ? ?Intake/Output Summary (Last 24 hours) at 01/06/2022 0857 ?Last data filed at 01/06/2022 0600 ?Gross per 24 hour  ?Intake 1371.25 ml  ?Output 3150 ml  ?Net -1778.75 ml  ? ?Filed Weights  ? 12/25/21 1000 12/27/21 0500 01/03/22 0400  ?Weight: 100.8 kg 100.6 kg 92.1 kg  ? ?Examination:  ?General sedated on vent  ?HENT NCAT trach unremarkable  ?Pulm diffuse rhonchi but excellent VT and no sig WOB on PSV 5 ?Card rr ?Abd soft PEG unremarkable  ?GU clear yellow  ?Neuro heavily sedated.  ?Ext warm + anasarca  ? ? ?Resolved Hospital Problem list   ?Septic Shock, resolved  ?Ileus/partial small bowel obstruction ?Pneumonia  ?Gastritis 2/2 OG tube  ?Right knee erythremia  ?Assessment & Plan:  ?Principal Problem: ?  Endocarditis of mitral valve ?Active Problems: ?  Acute ischemic right MCA stroke (HCC) ?  Sinus tachycardia ?  Acute respiratory failure with hypoxia (HCC) ?  Prostate abscess ?  Acute kidney injury (HCC) ?  Anemia due to GI blood loss ?  Pericardial effusion ? ? ?Culture negative Mitral Valve Endocarditis - confirmed by TEE ?Pericardial Effusion ?- Blood cultures negative throughout admission, however on Doxycycline PTA ?-source not clear. ? 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 This although unusual was only positive finding ?Plan ?Cont Nafcillin x 6 weeks course  ? ? ?Acute hypoxic respiratory failure - now s/p trach 4/24 ?-mental status limiting  factor to weaning at this point.  ?-completed abx for PNA ?Plan ?Cont PSV as tolerated ?Hope after sedation wears off we can start ATC trials ?VAP bundle ? ?MSSA Prostate Abscess ?- On admission CTA, area of lobulation within the inferolateral portion of the prostate. PSA markedly elevated at 25.7. Previously mildly elevated at 1.6. Due to this, Urology consulted with MRI prostate demonstrating large abscess. S/p transurethral resection on 4/18. Urine cultures growing MSSA ?- Urology has signed off. Will need outpatient follow up ?Plan ?Keep foley in place x 2 weeks (5/2 then can start voiding trials) ?Abx as above  ? ?Acute R MCA, with M2 occlusion s/p mechanical thrombectomy with complete revascularization ?Bilateral Embolic Infarcts  ?- Given MV endocarditis and no prior history of atherosclerotic disease, suspect this to be secondary to septic emboli ?Plan ?Supportive care ?Seizure precautions  ?Will need extensive PT/OT  ? ?Acute blood loss anemia s/p 6 units of pRBC transfusions.  Now resolved 2/2 Lower GI bleeding ?- Bedside EGD on 4/18, and colonoscopy on 4/17, 4/18 with blood present however source not identified. Notable for gastritis secondary to OG tube but non-bleeding ?Plan ?PPI  ?Avoid NSAIDs ?Transfusion goal > 7 ? ?  Acute Kidney Injury - improved ?-Creatinine 1.83, GFR 42 on admit 4/14, creatinine peaked at 2.33, GFR 31 4/16.  5/22 creatinine normal at 1.20, GFR greater than 60 ?Plan ?Avoid nephrotoxins ?Serial chems  ?Strict I&O ? ?Fluid and Electrolyte imbalance: hypernatremia, hyperchloremia ?Plan ?Adjusting free water ?Serial chems ? ?Diffuse mild STE ?Demand cardiac ischemia ?- Suspected to be related to sepsis vs stress demand ?Plan ?Tele  ? ?Prediabetes with hyperglycemia ?- Patient's hemoglobin A1c is 6 ?Plan ?Cont ssi  ? ? ? ?Best Practice (right click and "Reselect all SmartList Selections" daily)  ? ?Diet/type:tubefeeds via PEG  ?DVT prophylaxis: SCDs ?GI prophylaxis: PPI ?Lines:  N/A ?Foley:  Yes. Voiding trials can start on 01/13/2022 ?Code Status:  full code ?Last date of multidisciplinary goals of care discussion: 4/23 Sister and girlfriend updated at bedside, see plan of care note for full

## 2022-01-06 NOTE — Progress Notes (Signed)
? ?[facility] Infectious Diseases Follow Up Note ? ?Patient Identification: ?Patient Name: Vincent GowdaDavid Ferrell MRN: [id number] Admit Date: 12/25/2021 10:23 AM ?Age: 61 y.o.Today's Date: 01/06/2022 ? ? ?Reason for Visit: Follow up on endocarditis/fevers ? ?Principal Problem: ?  Endocarditis of mitral valve ?Active Problems: ?  Acute ischemic right MCA stroke (HCC) ?  Sinus tachycardia ?  Acute respiratory failure with hypoxia (HCC) ?  Prostate abscess ?  Acute kidney injury (HCC) ?  Multifocal pneumonia ?  Anemia due to GI blood loss ?  Pericardial effusion ? ? ?Antibiotics:  ?Nafcillin 4/21-c ?Amp sulbactam 4/15-4/17, ceftriaxone 4/17-4/20 ?Vancomycin 4/14, 4/15, 4/17-4/20 ?Cefazolin/ceftriaxone/azithromycin 4/14 ? ?Lines/Hardwares:  ? ?Interval Events: One episode of fever 101.8 ? ? 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 ?Acute RT ischemic CVA s/p mechanical thrombectomy with complete revascularization  ? ?Moderate pericardial effusion without tamponade - not a surgical candidate per TCTS as well as Cardiology. Repeat TTE 4/25 with small pericardial effusion. ? ?Acute hypoxic respiratory failure secondary to multifocal pneumonia and #1 - s/p Trach 4/24 and PEG 4/25 ? 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 ?AKI - down trending  ? ?Acute encephalopathy - multifactorial  ? ?Fevers - likely post op fever due to PEG placed yesterday ? ? ?Recommendations ?Continue nafcillin infusion, plan for 6 weeks from date of negative blood culturs  ?Monitor BMP while on Nafcillin  ?Monitor fever curve  ?Monitor improvement in mental status  ? ?Rest of the management as per the primary  team. ?Thank you for the consult. Please page with pertinent questions or concerns. ? ?______________________________________________________________________ ?Subjective ?patient seen and examined at the bedside. ?One family member at bedside ? ? ?Vitals ?BP 123/74   Pulse 96   Temp (!) 100.5 ?F (38.1 ?C) (Axillary)   Resp (!) 22   Ht 5' 11.5" (1.816 m)   Wt 92.1 kg   SpO2 98%   BMI 27.92 kg/m?  ? ?  ?Physical Exam ?Constitutional: Lying in bed ?   Comments: On 50  fentanyl and 45  propofol, on tube feeds ? ?Cardiovascular:  ?   Rate and Rhythm: Normal rate and regular rhythm.  ?   Heart sounds:  ? ?Pulmonary:  ?   Effort: Pulmonary effort is normal on vent ?   Comments: Coarse breath sounds bilaterally ? ?Abdominal:  ?   Palpations: Abdomen is soft.  ?   Tenderness:  ? ?Musculoskeletal:     ?   General: No swellingin peripheral joints.  ? ?Skin: ?   Comments:  ? ?Neurological:  ?   General: unable to assess  ? ?Pertinent Microbiology ?Results for orders placed or performed during the hospital encounter of 12/25/21  ?Resp Panel by RT-PCR (Flu A&B, Covid) Nasopharyngeal Swab     Status: None  ? Collection Time: 12/25/21 11:14 AM  ? Specimen: Nasopharyngeal Swab; Nasopharyngeal(NP) swabs in vial transport medium  ?Result Value Ref Range Status  ? SARS Coronavirus 2 by RT PCR NEGATIVE NEGATIVE Final  ?  Comment: (NOTE) ?SARS-CoV-2 target nucleic acids are NOT DETECTED. ? ?The SARS-CoV-2 RNA is generally detectable in upper respiratory ?specimens during the acute phase of infection. The lowest ?concentration of SARS-CoV-2 viral copies this assay can detect is ?138 copies/mL. A negative result does not preclude SARS-Cov-2 ?infection and  should not be used as the sole basis for treatment or ?other patient management decisions. A negative result may occur with  ?improper specimen collection/handling, submission of specimen other ?than nasopharyngeal swab, presence of viral mutation(s) within the ?areas targeted by  this assay, and inadequate number of viral ?copies(<138 copies/mL). A negative result must be combined with ?clinical observations, patient history, and epidemiological ?information. The expected result is Negative. ? ?Fact Sheet for Patients:  ?BloggerCourse.com ? ?Fact Sheet for Healthcare Providers:  ?SeriousBroker.it ? ?This test is no t yet approved or cleared by the [place] FDA and  ?has been authorized for detection and/or diagnosis of SARS-CoV-2 by ?FDA under an Emergency Use Authorization (EUA). This EUA will remain  ?in effect (meaning this test can be used) for the duration of the ?COVID-19 declaration under Section 564(b)(1) of the Act, 21 ?U.S.C.section 360bbb-3(b)(1), unless the authorization is terminated  ?or revoked sooner.  ? ? ?  ? Influenza A by PCR NEGATIVE NEGATIVE Final  ? Influenza B by PCR NEGATIVE NEGATIVE Final  ?  Comment: (NOTE) ?The Xpert Xpress SARS-CoV-2/FLU/RSV plus assay is intended as an aid ?in the diagnosis of influenza from Nasopharyngeal swab specimens and ?should not be used as a sole basis for treatment. Nasal washings and ?aspirates are unacceptable for Xpert Xpress SARS-CoV-2/FLU/RSV ?testing. ? ?Fact Sheet for Patients: ?BloggerCourse.com ? ?Fact Sheet for Healthcare Providers: ?SeriousBroker.it ? ?This test is not yet approved or cleared by the [place] FDA and ?has been authorized for detection and/or diagnosis of SARS-CoV-2 by ?FDA under an Emergency Use Authorization (EUA). This EUA will remain ?in effect (meaning this test can be used) for the duration of the ?COVID-19 declaration under Section 564(b)(1) of the Act, 21 U.S.C. ?section 360bbb-3(b)(1), unless the authorization is terminated or ?revoked. ? ?Performed at [facility], [address], [place], [place] ?[address] ?  ?Culture, blood (Routine X 2) w Reflex to ID Panel     Status: None  ?  Collection Time: 12/25/21  1:15 PM  ? Specimen: BLOOD RIGHT HAND  ?Result Value Ref Range Status  ? Specimen Description BLOOD RIGHT HAND  Final  ? Special Requests   Final  ?  BOTTLES DRAWN AEROBIC AND ANAEROBIC Blood Culture adequate volume  ? Culture   Final  ?  NO GROWTH 5 DAYS ?Performed at [facility], [address], [place], [place] [address] ?  ? Report Status 12/30/2021 FINAL  Final  ?Culture, blood (Routine X 2) w Reflex to ID Panel     Status: None  ? Collection Time: 12/25/21  1:26 PM  ? Specimen: BLOOD LEFT HAND  ?Result Value Ref Range Status  ? Specimen Description BLOOD LEFT HAND  Final  ? Special Requests   Final  ?  BOTTLES DRAWN AEROBIC AND ANAEROBIC Blood Culture results may not be optimal due to an inadequate volume of blood received in culture bottles  ? Culture   Final  ?  NO GROWTH 5 DAYS ?Performed at [facility], [address], [place], [place] [address] ?  ? Report Status 12/30/2021 FINAL  Final  ?MRSA Next Gen by PCR, Nasal     Status: None  ? Collection Time: 12/25/21  3:52 PM  ? Specimen: Nasal Mucosa; Nasal Swab  ?Result Value Ref Range Status  ? MRSA by PCR Next Gen NOT DETECTED NOT DETECTED Final  ?  Comment: (NOTE) ?The GeneXpert MRSA Assay (FDA approved for NASAL specimens only), ?is one  component of a comprehensive MRSA colonization surveillance ?program. It is not intended to diagnose MRSA infection nor to guide ?or monitor treatment for MRSA infections. ?Test performance is not FDA approved in patients less than 2 years ?old. ?Performed at [facility], [address], [place], [place] ?[address] ?  ?Culture, blood (Routine X 2) w Reflex to ID Panel     Status: None  ? Collection Time: 12/28/21  4:35 PM  ? Specimen: BLOOD LEFT HAND  ?Result Value Ref Range Status  ? Specimen Description BLOOD LEFT HAND  Final  ? Special Requests   Final  ?  BOTTLES DRAWN AEROBIC AND ANAEROBIC Blood Culture adequate volume  ? Culture   Final  ?  NO GROWTH 5 DAYS ?Performed  at [facility], [address] [place], [place] [address] ?  ? Report Status 01/02/2022 FINAL  Final  ?Culture, blood (Routine X 2) w Reflex to ID Panel     Status: None  ? Collection Time: 12/28/21  4:42 PM  ? Speci

## 2022-01-06 NOTE — Evaluation (Signed)
Physical Therapy Re-Evaluation ?Patient Details ?Name: Vincent Ferrell ?MRN: [id number] ?DOB: 16-Dec-1960 ?Today's Date: 01/06/2022 ? ?History of Present Illness ? Pt is 61 yo male who presents with L side weakness, L facial droop, and R gaze deviation. He had been dx with bronchopneumonia on 4/11. 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 Pt required intubation on 4/14. Pt with lower GIB, colonoscopy 4/17 and upper GI endoscopy 4/18. Pt underwent Transurethral resection of the prostatic abscess on 4/18. PMH: HLD, HTN, elevated glucose.  Had desats and ETT advanced on 4/20, then underwent trach and bronch on 4/24, and PEG placed 4/25.  ?Clinical Impression ? Patient with decrease in mobility since last seen on 4/19.  Today though sedation lifted not able to follow commands or rouse much more than eye opening and grimacing with passive head rotation to L.  Maintained on PS/CPAP throughout on vent via trach and VSS.  Sister reports some agitation at times with shaking or tremors.  PT will continue to follow acutely.  Currently most appropriate for SNF level rehab at d/c.  ?   ? ?Recommendations for follow up therapy are one component of a multi-disciplinary discharge planning process, led by the attending physician.  Recommendations may be updated based on patient status, additional functional criteria and insurance authorization. ? ?Follow Up Recommendations Skilled nursing-short term rehab (<3 hours/day) ? ?  ?Assistance Recommended at Discharge Frequent or constant Supervision/Assistance  ?Patient can return home with the following ? Two people to help with walking and/or transfers;Assistance with cooking/housework;Two people to help with bathing/dressing/bathroom;Assist for transportation;Direct supervision/assist for financial management ? ?  ?Equipment Recommendations Rolling walker (2 wheels);Rollator (4 wheels)  ?Recommendations for Other Services ?    ?  ?Functional Status Assessment    ? ?   ?Precautions / Restrictions Precautions ?Precautions: Fall ?Precaution Comments: L hemiplegia, trach and PEG  ? ?  ? ?Mobility ? Bed Mobility ?Overal bed mobility: Needs Assistance ?  ?  ?  ?  ?  ?  ?General bed mobility comments: dependent for mobility ?  ? ?Transfers ?  ?  ?  ?  ?  ?  ?  ?  ?  ?  ?  ? ?Ambulation/Gait ?  ?  ?  ?  ?  ?  ?  ?  ? ?Stairs ?  ?  ?  ?  ?  ? ?Wheelchair Mobility ?  ? ?Modified Rankin (Stroke Patients Only) ?  ? ?  ? ?Balance   ?  ?  ?  ?  ?  ?  ?  ?  ?  ?  ?  ?  ?  ?  ?  ?  ?  ?  ?   ? ? ? ?Pertinent Vitals/Pain Pain Assessment ?Facial Expression: Tense (grimacing with L head turn) ?Body Movements: Absence of movements ?Muscle Tension: Relaxed ?Compliance with ventilator (intubated pts.): Tolerating ventilator or movement ?Vocalization (extubated pts.): N/A ?CPOT Total: 1  ? ? ?Home Living Family/patient expects to be discharged to:: Private residence ?Living Arrangements: Alone ?Available Help at Discharge: Other (Comment);Available PRN/intermittently (significant other intermittently) ?Type of Home: House ?Home Access: Stairs to enter ?  ?Entrance Stairs-Number of Steps: 3 at front, garage in basement ?Alternate Level Stairs-Number of Steps: flight ?Home Layout: Two level;Bed/bath upstairs ?Home Equipment: None ?Additional Comments: sister present to give information  ?  ?Prior Function Prior Level of Function : Driving;Independent/Modified Independent ?  ?  ?  ?  ?  ?  ?Mobility  Comments: worked as Chief Financial Officer ?  ?  ? ? ?Hand Dominance  ? Dominant Hand: Right ? ?  ?Extremity/Trunk Assessment  ? Upper Extremity Assessment ?Upper Extremity Assessment: RUE deficits/detail;LUE deficits/detail ?RUE Deficits / Details: not actively moving to command or spontaneously during session; missing middle finger R hand from injury many years ago ?LUE Deficits / Details: not actively moving to command or spontaneously during session ?  ? ?Lower Extremity Assessment ?Lower Extremity Assessment: LLE  deficits/detail;RLE deficits/detail ?RLE Deficits / Details: no active movement or reflexive movement seen during session, sister reports he does wiggle his toes at times; PROM WFL ?LLE Deficits / Details: no active movement or reflexive movement seen during session, sister reports he does wiggle his toes at times; PROM WFL ?  ? ?   ?Communication  ? Communication: Tracheostomy  ?Cognition Arousal/Alertness: Lethargic ?Behavior During Therapy: Flat affect ?Overall Cognitive Status: Difficult to assess ?  ?  ?  ?  ?  ?  ?  ?  ?  ?  ?  ?  ?  ?  ?  ?  ?General Comments: some eye opening especially with head/neck ROM, does grimace to L head turn PROM ?  ?  ? ?  ?General Comments General comments (skin integrity, edema, etc.): on PS/CPAP trach via vent 40% FiO2 PEEP 5; sister, Eustaquio Maize, present from [place]; educated in proper stiimulus for arousal and rest throughout day with lights on and HOB up ? ?  ?Exercises Other Exercises ?Other Exercises: PROM to UE's and LE's in supine, head and neck rotation/ flex/ext PROM  ? ?Assessment/Plan  ?  ?PT Assessment    ?PT Problem List   ? ?   ?  ?PT Treatment Interventions     ? ?PT Goals (Current goals can be found in the Care Plan section)  ?Acute Rehab PT Goals ?Patient Stated Goal: per sister for rehab ?PT Goal Formulation: With family ?Time For Goal Achievement: 01/20/22 ?Potential to Achieve Goals: Fair ? ?  ?Frequency Min 3X/week ?  ? ? ?Co-evaluation   ?  ?  ?  ?  ? ? ?  ?AM-PAC PT "6 Clicks" Mobility  ?Outcome Measure Help needed turning from your back to your side while in a flat bed without using bedrails?: Total ?Help needed moving from lying on your back to sitting on the side of a flat bed without using bedrails?: Total ?Help needed moving to and from a bed to a chair (including a wheelchair)?: Total ?Help needed standing up from a chair using your arms (e.g., wheelchair or bedside chair)?: Total ?Help needed to walk in hospital room?: Total ?Help needed climbing 3-5 steps  with a railing? : Total ?6 Click Score: 6 ? ?  ?End of Session Equipment Utilized During Treatment: Oxygen ?Activity Tolerance: Patient limited by lethargy ?Patient left: in bed;with family/visitor present ?  ?PT Visit Diagnosis: Other abnormalities of gait and mobility (R26.89);Muscle weakness (generalized) (M62.81);Other symptoms and signs involving the nervous system (R29.898);Hemiplegia and hemiparesis ?Hemiplegia - Right/Left: Left ?Hemiplegia - dominant/non-dominant: Non-dominant ?Hemiplegia - caused by: Cerebral infarction ?  ? ?Time: [id number] ?PT Time Calculation (min) (ACUTE ONLY): 23 min ? ? ?Charges:   PT Evaluation ?$PT Re-evaluation: 1 Re-eval ?PT Treatments ?$Therapeutic Exercise: 8-22 mins ?  ?   ? ? ?Magda Kiel, PT ?Acute Rehabilitation Services ?[id number] ?[phone] ?01/06/2022 ? ? ?Reginia Naas ?01/06/2022, 4:49 PM ? ?

## 2022-01-06 NOTE — Progress Notes (Addendum)
STROKE TEAM PROGRESS NOTE  ? ?SUBJECTIVE (INTERVAL HISTORY) ?Patient is seen in his room with his sister at the bedside.  He has had a tracheostomy placed 4/24 and appears more comfortable.  Patient had PEG tube placed yesterday.  He remains sedated for agitation and respiratory failure.   .  Vital signs stable.  Neurological exam limited due to sedation ? ?OBJECTIVE ?Temp:  [98.9 ?F (37.2 ?C)-101.8 ?F (38.8 ?C)] 100.5 ?F (38.1 ?C) (04/26 0800) ?Pulse Rate:  [77-110] 110 (04/26 1100) ?Cardiac Rhythm: Normal sinus rhythm (04/25 2000) ?Resp:  [20-27] 25 (04/26 1100) ?BP: (84-147)/(52-83) 147/81 (04/26 1100) ?SpO2:  [90 %-100 %] 90 % (04/26 1100) ?FiO2 (%):  [40 %-45 %] 45 % (04/26 1030) ? ?Recent Labs  ?Lab 01/05/22 ?1958 01/05/22 ?2333 01/06/22 ?[id number] 01/06/22 ?[id number] 01/06/22 ?1121  ?GLUCAP 94 126* 113* 128* 129*  ? ? ?Recent Labs  ?Lab 01/02/22 ?0208 01/03/22 ?1020 01/04/22 ?0801 01/05/22 ?0423 01/06/22 ?[id number]  ?NA 146* 146* 148* 144 147*  ?K 4.7 3.8 4.2 4.1 3.7  ?CL 115* 111 112* 109 113*  ?CO2 25 27 29 25 28   ?GLUCOSE 120* 145* 135* 94 119*  ?BUN 27* 28* 30* 34* 34*  ?CREATININE 1.20 1.18 1.25* 1.30* 1.23  ?CALCIUM 7.8* 8.0* 8.1* 7.9* 8.0*  ?MG  --   --   --  2.4 2.5*  ?PHOS  --   --   --  5.5* 5.0*  ? ? ?Recent Labs  ?Lab 01/01/22 ?0309 01/05/22 ?0423  ?AST 40 42*  ?ALT 35 30  ?ALKPHOS 57 68  ?BILITOT 0.5 0.8  ?PROT 5.2* 5.7*  ?ALBUMIN 1.8* 1.9*  ? ? ?Recent Labs  ?Lab 01/01/22 ?0309 01/02/22 ?0208 01/03/22 ?1020 01/04/22 ?0801 01/05/22 ?0423 01/06/22 ?[id number]  ?WBC 11.2* 10.4 7.0 7.5 7.7 6.2  ?NEUTROABS 9.3* 7.3  --   --  5.3  --   ?HGB 9.2* 9.1* 9.0* 9.7* 9.2* 8.9*  ?HCT 30.0* 29.6* 28.4* 31.8* 30.6* 29.2*  ?MCV 98.0 97.4 94.7 96.4 98.7 97.3  ?PLT 448* 395 405* 403* 314 308  ? ? ?No results for input(s): CKTOTAL, CKMB, CKMBINDEX, TROPONINI in the last 168 hours. ?Recent Labs  ?  01/04/22 ?0801  ?LABPROT 14.1  ?INR 1.1  ? ? ? ?No results for input(s): COLORURINE, LABSPEC, PHURINE, GLUCOSEU, HGBUR, BILIRUBINUR,  KETONESUR, PROTEINUR, UROBILINOGEN, NITRITE, LEUKOCYTESUR in the last 72 hours. ? ?Invalid input(s): APPERANCEUR ?  ?   ?Component Value Date/Time  ? CHOL 89 12/26/2021 0230  ? TRIG 92 01/04/2022 0447  ? HDL <10 (L) 12/26/2021 0230  ? CHOLHDL NOT CALCULATED 12/26/2021 0230  ? VLDL 43 (H) 12/26/2021 0230  ? LDLCALC NOT CALCULATED 12/26/2021 0230  ? ?Lab Results  ?Component Value Date  ? HGBA1C 6.0 (H) 12/25/2021  ? ?   ?Component Value Date/Time  ? LABOPIA NONE DETECTED 12/26/2021 0116  ? COCAINSCRNUR NONE DETECTED 12/26/2021 0116  ? LABBENZ NONE DETECTED 12/26/2021 0116  ? AMPHETMU NONE DETECTED 12/26/2021 0116  ? THCU NONE DETECTED 12/26/2021 0116  ? LABBARB NONE DETECTED 12/26/2021 0116  ?  ?No results for input(s): ETH in the last 168 hours. ? ?I have personally reviewed the radiological images below and agree with the radiology interpretations. ? ?DG Knee 1-2 Views Right ? ?Result Date: 01/03/2022 ?CLINICAL DATA:  Right knee red and warm to touch. EXAM: RIGHT KNEE - 1-2 VIEW COMPARISON:  None. FINDINGS: Mild superior greater than inferior patellar degenerative osteophytes. Tiny joint effusion. Mild chronic enthesopathic change at the quadriceps insertion  on the patella. No acute fracture or dislocation. IMPRESSION: Tiny joint effusion.  No acute fracture. Electronically Signed   By: Neita Garnet M.D.   On: 01/03/2022 16:17  ? ?DG Abd 1 View ? ?Result Date: 12/25/2021 ?CLINICAL DATA:  Orogastric tube placement. EXAM: ABDOMEN - 1 VIEW COMPARISON:  None. FINDINGS: An orogastric tube is seen with its distal tip overlying the expected region of the gastric antrum. The bowel gas pattern is normal. No radio-opaque calculi or other significant radiographic abnormality are seen. IMPRESSION: Orogastric tube positioning, as described above. Electronically Signed   By: Aram Candela M.D.   On: 12/25/2021 19:09  ? ?DG Abd 1 View ? ?Result Date: 12/25/2021 ?CLINICAL DATA:  OG tube placement. EXAM: ABDOMEN - 1 VIEW  COMPARISON:  One view chest x-ray 12:45 p.m. FINDINGS: Gaseous distention of the stomach noted. Bowel gas pattern otherwise unremarkable. OG tube is not visualized over the distal esophagus or stomach. IMPRESSION: OG tube is not visualized over the distal esophagus or stomach. Gaseous distention of the stomach. Electronically Signed   By: Marin Roberts M.D.   On: 12/25/2021 19:06  ? ?MR ANGIO HEAD WO CONTRAST ? ?Result Date: 12/26/2021 ?CLINICAL DATA:  Acute infarct. Revascularization of superior right M2 segment. EXAM: MRA HEAD WITHOUT CONTRAST TECHNIQUE: Angiographic images of the Circle of Willis were acquired using MRA technique without intravenous contrast. COMPARISON:  CTA head and neck 12/25/2021.  Revascularization films. FINDINGS: Anterior circulation: The internal carotid arteries are within normal limits from high cervical segments through the ICA termini bilaterally. The A1 and M1 segments are normal. MCA bifurcations are within normal limits. MCA bifurcations are intact. The revascularized superior right M2 segments remain patent. ACA and MCA branch vessels are normal bilaterally. Posterior circulation: The vertebral arteries are codominant. AICA vessels are dominant. The basilar artery is within normal limits. Right posterior cerebral artery originates from basilar tip. Left posterior cerebral artery is of fetal type. A left P1 segment contributes. PCA branch vessels are within normal limits bilaterally. Anatomic variants: None Other: None. IMPRESSION: 1. Normal variant MRA Circle of Willis without evidence for significant proximal stenosis, aneurysm, or branch vessel occlusion. 2. The revascularized superior right M2 segments remain patent. Electronically Signed   By: Marin Roberts M.D.   On: 12/26/2021 16:54  ? ?MR BRAIN WO CONTRAST ? ?Result Date: 01/03/2022 ?CLINICAL DATA:  Stroke follow-up EXAM: MRI HEAD WITHOUT CONTRAST TECHNIQUE: Multiplanar, multiecho pulse sequences of the brain  and surrounding structures were obtained without intravenous contrast. COMPARISON:  12/26/2021 FINDINGS: Brain: Expected evolution of diffusion abnormalities in the right MCA and right PCA territories. Areas of diffusion restriction in the left frontal and parietal white matter of also undergone expected changes. Petechial hemorrhage in the left parietal lesion is slightly diminished. No midline shift or other mass effect. Normal white matter signal, parenchymal volume and CSF spaces. The midline structures are normal. Vascular: Major flow voids are preserved. Skull and upper cervical spine: Normal calvarium and skull base. Visualized upper cervical spine and soft tissues are normal. Sinuses/Orbits:Mastoid effusions. Paranasal sinuses are clear. Normal orbits. IMPRESSION: 1. Expected evolution of subacute ischemia in the right MCA territory, right occipital lobe and left frontal and parietal white matter. 2. Petechial blood in the left parietal lobe. Heidelberg classification 1a: HI1, scattered small petechiae, no mass effect. Electronically Signed   By: Deatra Robinson M.D.   On: 01/03/2022 03:40  ? ?MR BRAIN WO CONTRAST ? ?Result Date: 12/26/2021 ?CLINICAL DATA:  Status post revascularization of  right superior M2 occlusion. EXAM: MRI HEAD WITHOUT CONTRAST TECHNIQUE: Multiplanar, multiecho pulse sequences of the brain and surrounding structures were obtained without intravenous contrast. COMPARISON:  CTA head and neck, CT perfusion, and revascularization images 12/25/21 FINDINGS: Brain: Diffusion-weighted images demonstrate acute hemorrhage within the superior right MCA division involving the right insular cortex and frontal lobe. Diffuse cortical infarct involves the high right parietal cortex. Sparing of the more inferior parietal lobe and superior temporal lobe noted. Focal area of nonhemorrhagic infarct is present in the right occipital pole. Contralateral left acute parietal and posterior left frontal lobe  cortical infarcts are present as well. T2 hyperintensities are associated with the areas of acute/subacute infarction. Focal susceptibility involving the left parietal infarct noted. Additional areas of cortical hemorrhage ar

## 2022-01-06 NOTE — Progress Notes (Signed)
RT attempted pt on 40% ATC. Sats dropped to 87%, FIO2 was increased to 60% and sats remained  between 87%-89%. Pt was placed back on PSV 5/5 and FIO2 was increased to 45%. Sats are currently 90%. RN at bedside. ?

## 2022-01-06 NOTE — Progress Notes (Signed)
SLP Cancellation Note ? ?Patient Details ?Name: Vincent Ferrell ?MRN: [id number] ?DOB: 1961/09/01 ? ? ?Cancelled treatment:       Reason Eval/Treat Not Completed: Patient not medically ready (Pt remains on the vent. SLP will follow up.) ? ?Willine Schwalbe I. Vear Clock, MS, CCC-SLP ?Acute Rehabilitation Services ?Office number [phone] ?Pager [phone] ? ?[name] ?01/06/2022, 8:14 AM ?

## 2022-01-06 NOTE — Progress Notes (Signed)
[facility] ADULT ICU REPLACEMENT PROTOCOL ? ? ?The patient does apply for the [facility] Adult ICU Electrolyte Replacment Protocol based on the criteria listed below:  ? ?1.Exclusion criteria: TCTS patients, ECMO patients, and Dialysis patients ?2. Is GFR >/= 30 ml/min? Yes.    ?Patient's GFR today is >60 ?3. Is SCr </= 2? Yes.   ?Patient's SCr is 1.23 mg/dL ?4. Did SCr increase >/= 0.5 in 24 hours? No. ?5.Pt's weight >40kg  Yes.   ?6. Abnormal electrolyte(s): K+ 3.7  ?7. Electrolytes replaced per protocol ?8.  Call MD STAT for K+ </= 2.5, Phos </= 1, or Mag </= 1 ?Physician:  n/a ? ?Melvern Banker 01/06/2022 4:43 AM ? ?

## 2022-01-07 DIAGNOSIS — E43 Unspecified severe protein-calorie malnutrition: Secondary | ICD-10-CM | POA: Diagnosis present

## 2022-01-07 DIAGNOSIS — R5381 Other malaise: Secondary | ICD-10-CM

## 2022-01-07 DIAGNOSIS — A419 Sepsis, unspecified organism: Secondary | ICD-10-CM

## 2022-01-07 DIAGNOSIS — G9340 Encephalopathy, unspecified: Secondary | ICD-10-CM

## 2022-01-07 DIAGNOSIS — R197 Diarrhea, unspecified: Secondary | ICD-10-CM

## 2022-01-07 LAB — CBC
HCT: 32.5 % — ABNORMAL LOW (ref 39.0–52.0)
Hemoglobin: 9.9 g/dL — ABNORMAL LOW (ref 13.0–17.0)
MCH: 29.3 pg (ref 26.0–34.0)
MCHC: 30.5 g/dL (ref 30.0–36.0)
MCV: 96.2 fL (ref 80.0–100.0)
Platelets: 377 10*3/uL (ref 150–400)
RBC: 3.38 MIL/uL — ABNORMAL LOW (ref 4.22–5.81)
RDW: 14.6 % (ref 11.5–15.5)
WBC: 8.1 10*3/uL (ref 4.0–10.5)
nRBC: 0 % (ref 0.0–0.2)

## 2022-01-07 LAB — COMPREHENSIVE METABOLIC PANEL
ALT: 30 U/L (ref 0–44)
AST: 43 U/L — ABNORMAL HIGH (ref 15–41)
Albumin: 1.8 g/dL — ABNORMAL LOW (ref 3.5–5.0)
Alkaline Phosphatase: 76 U/L (ref 38–126)
Anion gap: 7 (ref 5–15)
BUN: 29 mg/dL — ABNORMAL HIGH (ref 8–23)
CO2: 30 mmol/L (ref 22–32)
Calcium: 8.4 mg/dL — ABNORMAL LOW (ref 8.9–10.3)
Chloride: 112 mmol/L — ABNORMAL HIGH (ref 98–111)
Creatinine, Ser: 1.26 mg/dL — ABNORMAL HIGH (ref 0.61–1.24)
GFR, Estimated: >60 mL/min (ref >60)
Glucose, Bld: 141 mg/dL — ABNORMAL HIGH (ref 70–99)
Potassium: 4.1 mmol/L (ref 3.5–5.1)
Sodium: 149 mmol/L — ABNORMAL HIGH (ref 135–145)
Total Bilirubin: 0.7 mg/dL (ref 0.3–1.2)
Total Protein: 6.3 g/dL — ABNORMAL LOW (ref 6.5–8.1)

## 2022-01-07 LAB — GLUCOSE, CAPILLARY
Glucose-Capillary: 134 mg/dL — ABNORMAL HIGH (ref 70–99)
Glucose-Capillary: 112 mg/dL — ABNORMAL HIGH (ref 70–99)
Glucose-Capillary: 141 mg/dL — ABNORMAL HIGH (ref 70–99)
Glucose-Capillary: 128 mg/dL — ABNORMAL HIGH (ref 70–99)
Glucose-Capillary: 157 mg/dL — ABNORMAL HIGH (ref 70–99)

## 2022-01-07 LAB — TRIGLYCERIDES: Triglycerides: 99 mg/dL (ref <150)

## 2022-01-07 MED ORDER — FUROSEMIDE 10 MG/ML IJ SOLN
40.0000 mg | Freq: Once | INTRAMUSCULAR | Status: AC
  Administered 2022-01-07: 40 mg via INTRAVENOUS
  Filled 2022-01-07: qty 4

## 2022-01-07 MED ORDER — POTASSIUM CHLORIDE 20 MEQ PO PACK
40.0000 meq | PACK | Freq: Once | ORAL | Status: AC
  Administered 2022-01-07: 40 meq
  Filled 2022-01-07: qty 2

## 2022-01-07 MED ORDER — JEVITY 1.5 CAL/FIBER PO LIQD
1000.0000 mL | ORAL | Status: DC
  Administered 2022-01-07 – 2022-01-15 (×12): 1000 mL
  Filled 2022-01-07 (×15): qty 1000

## 2022-01-07 MED ORDER — QUETIAPINE FUMARATE 25 MG PO TABS
25.0000 mg | ORAL_TABLET | Freq: Two times a day (BID) | ORAL | Status: DC
  Administered 2022-01-07 – 2022-01-09 (×5): 25 mg via NASOGASTRIC
  Filled 2022-01-07 (×5): qty 1

## 2022-01-07 MED ORDER — NAPHAZOLINE-GLYCERIN 0.012-0.25 % OP SOLN
1.0000 [drp] | Freq: Four times a day (QID) | OPHTHALMIC | Status: DC | PRN
  Filled 2022-01-07: qty 15

## 2022-01-07 MED ORDER — CLONAZEPAM 0.25 MG PO TBDP
0.2500 mg | ORAL_TABLET | Freq: Two times a day (BID) | ORAL | Status: DC
  Administered 2022-01-07 – 2022-01-09 (×5): 0.25 mg
  Filled 2022-01-07 (×5): qty 1

## 2022-01-07 MED ORDER — PROSOURCE TF PO LIQD
90.0000 mL | Freq: Two times a day (BID) | ORAL | Status: DC
  Administered 2022-01-07 – 2022-01-15 (×16): 90 mL
  Filled 2022-01-07 (×16): qty 90

## 2022-01-07 MED ORDER — DEXTROSE 5 % IV SOLN: INTRAVENOUS | Status: DC

## 2022-01-07 NOTE — TOC Initial Note (Addendum)
Transition of Care (TOC) - Initial/Assessment Note  ? ? ?Patient Details  ?Name: Vincent Ferrell ?MRN: [id number] ?Date of Birth: 09-Apr-1961 ? ?Transition of Care (TOC) CM/SW Contact:    ?Lockie Pares, RN ?Phone Number: ?01/07/2022, 9:43 AM ? ?Clinical Narrative:                 ? ?Day 12 in ICU for this unfortunate patient who initially went to see his PCP for upper respiratory symptoms and started on PO antibiotics. Presented to ED with Stroke, STEMI, aphasic and found to have  prostate abscess. IR for drain, PEG and trach. On IV antibiotics. Will need 6 weeks of antibiootics IV, MSSA. In cultures. PT and OT evaluation revealing recommendation for  SNF.  ? ?TOC will monitor for needs, recommendations, and transitions. ?1515 Spoke to sister Ms Shier regarding LTAC, had messaged with MD, and he seems ready to cross to LTAC. Ms Ella Bodo states they have researched and would like to pursue [facility].  ?[facility] representative notified of choice, so that they may converse with the sister regarding admittance.  ?They responded they would talk to sister and send out authorization tomorrow.  ? ?Expected Discharge Plan: Skilled Nursing Facility ?Barriers to Discharge: Continued Medical Work up, Inadequate or no insurance International aid/development worker) ? ? ?Patient Goals and CMS Choice ?  ?  ?  ? ?Expected Discharge Plan and Services ?Expected Discharge Plan: Skilled Nursing Facility ?In-house Referral: Clinical Social Work ?  ?Post Acute Care Choice: Skilled Nursing Facility ?Living arrangements for the past 2 months: Single Family Home ?                ?  ?  ?  ?  ?  ?  ?  ?  ?  ?  ? ?Prior Living Arrangements/Services ?Living arrangements for the past 2 months: Single Family Home ?  ?Patient language and need for interpreter reviewed:: Yes ?       ?Need for Family Participation in Patient Care: Yes (Comment) ?Care giver support system in place?: Yes (comment) ?  ?Criminal Activity/Legal Involvement Pertinent to Current  Situation/Hospitalization: No - Comment as needed ? ?Activities of Daily Living ?  ?  ? ?Permission Sought/Granted ?  ?  ?   ?   ?   ?   ? ?Emotional Assessment ?  ?  ?  ?Orientation: :  (Aphasic) ?Alcohol / Substance Use: Not Applicable ?Psych Involvement: No (comment) ? ?Admission diagnosis:  Sinus tachycardia [R00.0] ?Acute ischemic right MCA stroke (HCC) [I63.511] ?Acute right MCA stroke (HCC) [I63.511] ?Middle cerebral artery embolism, right [I66.01] ?Leukocytosis, unspecified type [D72.829] ?Patient Active Problem List  ? Diagnosis Date Noted  ? Encephalopathy acute 01/07/2022  ? Severe protein-calorie malnutrition (HCC) 01/07/2022  ? Physical deconditioning 01/07/2022  ? Diarrhea 01/07/2022  ? Pericardial effusion   ? 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 Prostate abscess 12/30/2021  ? Acute kidney injury (HCC) 12/30/2021  ? Anemia due to GI blood loss   ? Acute respiratory failure with hypoxia (HCC)   ? Acute ischemic right MCA stroke (HCC) 12/25/2021  ? Sinus tachycardia   ? Chest pain in adult 10/24/2017  ? Mixed hyperlipidemia 10/24/2017  ? Rising PSA level 10/24/2017  ? Impaired fasting glucose 10/24/2017  ? Elevated BP without diagnosis of hypertension 10/24/2017  ? ?PCP:  Lester Media., MD ?Pharmacy:   ?[id number] - [place], [place] - [address]OSS [address] ?[address] ?[place]  [address] ?Phone: [phone] Fax: [phone] ? ? ? ? ?Social Determinants of Health (SDOH) Interventions ?  ? ?Readmission Risk Interventions ?   ? View : No data to display.  ?  ?  ?  ? ? ? ?

## 2022-01-07 NOTE — Progress Notes (Signed)
? ?[facility] Infectious Diseases Follow Up Note ? ?Patient Identification: ?Patient Name: Vincent Ferrell MRN: [id number] Admit Date: 12/25/2021 10:23 AM ?Age: 61 y.o.Today's Date: 01/07/2022 ? ? ?Reason for Visit: Follow up on endocarditis/fevers ? ?Principal Problem: ?  Endocarditis of mitral valve ?Active Problems: ?  Acute ischemic right MCA stroke (HCC) ?  Sinus tachycardia ?  Acute respiratory failure with hypoxia (HCC) ?  Prostate abscess ?  Acute kidney injury (HCC) ?  Anemia due to GI blood loss ?  Pericardial effusion ?  Encephalopathy acute ?  Severe protein-calorie malnutrition (HCC) ?  Physical deconditioning ?  Diarrhea ? ? ?Antibiotics:  ?Nafcillin 4/21-c ?Amp sulbactam 4/15-4/17, ceftriaxone 4/17-4/20 ?Vancomycin 4/14, 4/15, 4/17-4/20 ?Cefazolin/ceftriaxone/azithromycin 4/14 ? ?Lines/Hardwares:  ? ?Interval Events:  continues to have low grade fevers T max 101. Off sedation and follows commands  ? 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 ?Acute RT ischemic CVA s/p mechanical thrombectomy with complete revascularization  ? ?Acute hypoxic respiratory failure secondary to multifocal pneumonia and #1 - s/p Trach 4/24 and PEG 4/25 ? 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 ?AKI - Cr 1.26 ? ?Acute encephalopathy( improving) - multifactorial, off sedation and follows some commands per RN ? ?Fevers - low suspicion for new infection. Stable vent settings, no reported diarrhea ( on laxatives) pressure sores, central lines ? ? ?Recommendations ?Continue nafcillin infusion, plan for 6 weeks total  ?Monitor BMP while on Nafcillin, may need to consider switch to cefazolin  in case cr continues to trend up  ?Monitor fever curve  ?Monitor improvement in mental status  ?Following intermittently  ?Disposition may be CIR  ? ?Rest of the management as per the primary team. ?Thank you for the consult. Please page with pertinent questions or concerns. ? ?______________________________________________________________________ ?Subjective ?patient seen and examined at the bedside. ?One family member at bedside ?Per RN, off sedation and follows some commands  ? ? ?Vitals ?BP (!) 148/96   Pulse (!) 107   Temp (!) 100.7 ?F (38.2 ?C) (Axillary)   Resp (!) 25   Ht 5' 11.5" (1.816 m)   Wt 92.1 kg   SpO2 95%   BMI 27.92 kg/m?  ? ?  ?Physical Exam ?Constitutional: Lying in bed ?   Comments: off sedation ? ?Cardiovascular:  ?   Rate and Rhythm: Normal rate and regular rhythm.  ?   Heart sounds:  ? ?Pulmonary:  ?   Effort: Pulmonary effort is normal on trach to vent ?   Comments: Coarse breath sounds bilaterally ? ?Abdominal:  ?   Palpations: Abdomen is soft.  ?   Tenderness: PEG+ ? ?Musculoskeletal:     ?   General: No swelling in peripheral joints.  ? ?Skin: ?   Comments:  ? ?Neurological:  ?   General: unable to assess  ? ?Pertinent Microbiology ?Results for orders placed or performed during the hospital encounter of 12/25/21  ?Resp Panel by RT-PCR (Flu A&B, Covid) Nasopharyngeal Swab     Status: None  ? Collection Time: 12/25/21 11:14 AM  ? Specimen: Nasopharyngeal Swab; Nasopharyngeal(NP) swabs in vial transport medium  ?Result Value Ref Range Status  ? SARS Coronavirus 2 by RT PCR NEGATIVE NEGATIVE Final  ?  Comment: (NOTE) ?SARS-CoV-2 target nucleic acids are NOT DETECTED. ? ?The SARS-CoV-2  RNA is generally detectable in upper respiratory ?specimens during the acute phase of infection. The lowest ?concentration of SARS-CoV-2 viral copies this assay can detect is ?138 copies/mL. A negative result does not preclude SARS-Cov-2 ?infection and should not be used as the sole basis for treatment  or ?other patient management decisions. A negative result may occur with  ?improper specimen collection/handling, submission of specimen other ?than nasopharyngeal swab, presence of viral mutation(s) within the ?areas targeted by this assay, and inadequate number of viral ?copies(<138 copies/mL). A negative result must be combined with ?clinical observations, patient history, and epidemiological ?information. The expected result is Negative. ? ?Fact Sheet for Patients:  ?BloggerCourse.comhttps://www.fda.gov/media/152166/download ? ?Fact Sheet for Healthcare Providers:  ?SeriousBroker.ithttps://www.fda.gov/media/152162/download ? ?This test is no t yet approved or cleared by the [place] FDA and  ?has been authorized for detection and/or diagnosis of SARS-CoV-2 by ?FDA under an Emergency Use Authorization (EUA). This EUA will remain  ?in effect (meaning this test can be used) for the duration of the ?COVID-19 declaration under Section 564(b)(1) of the Act, 21 ?U.S.C.section 360bbb-3(b)(1), unless the authorization is terminated  ?or revoked sooner.  ? ? ?  ? Influenza A by PCR NEGATIVE NEGATIVE Final  ? Influenza B by PCR NEGATIVE NEGATIVE Final  ?  Comment: (NOTE) ?The Xpert Xpress SARS-CoV-2/FLU/RSV plus assay is intended as an aid ?in the diagnosis of influenza from Nasopharyngeal swab specimens and ?should not be used as a sole basis for treatment. Nasal washings and ?aspirates are unacceptable for Xpert Xpress SARS-CoV-2/FLU/RSV ?testing. ? ?Fact Sheet for Patients: ?BloggerCourse.comhttps://www.fda.gov/media/152166/download ? ?Fact Sheet for Healthcare Providers: ?SeriousBroker.ithttps://www.fda.gov/media/152162/download ? ?This test is not yet approved or cleared by the [place] FDA and ?has been authorized for detection and/or diagnosis of SARS-CoV-2 by ?FDA under an Emergency Use Authorization (EUA). This EUA will remain ?in effect (meaning this test can be used) for the duration of the ?COVID-19 declaration under Section 564(b)(1) of the Act, 21 U.S.C. ?section  360bbb-3(b)(1), unless the authorization is terminated or ?revoked. ? ?Performed at [facility], [address] [place], [place] ?[address] ?  ?Culture, blood (Routine X 2) w Reflex to ID Panel     Status: None  ? Collection Time: 12/25/21  1:15 PM  ? Specimen: BLOOD RIGHT HAND  ?Result Value Ref Range Status  ? Specimen Description BLOOD RIGHT HAND  Final  ? Special Requests   Final  ?  BOTTLES DRAWN AEROBIC AND ANAEROBIC Blood Culture adequate volume  ? Culture   Final  ?  NO GROWTH 5 DAYS ?Performed at [facility], [address] [place], [place] [address] ?  ? Report Status 12/30/2021 FINAL  Final  ?Culture, blood (Routine X 2) w Reflex to ID Panel     Status: None  ? Collection Time: 12/25/21  1:26 PM  ? Specimen: BLOOD LEFT HAND  ?Result Value Ref Range Status  ? Specimen Description BLOOD LEFT HAND  Final  ? Special Requests   Final  ?  BOTTLES DRAWN AEROBIC AND ANAEROBIC Blood Culture results may not be optimal due to an inadequate volume of blood received in culture bottles  ? Culture   Final  ?  NO GROWTH 5 DAYS ?Performed at [facility], [address] [place], [place] [address] ?  ? Report Status 12/30/2021 FINAL  Final  ?MRSA Next Gen by PCR, Nasal     Status: None  ? Collection Time: 12/25/21  3:52 PM  ? Specimen: Nasal Mucosa;  Nasal Swab  ?Result Value Ref Range Status  ? MRSA by PCR Next Gen NOT DETECTED NOT DETECTED Final  ?  Comment: (NOTE) ?The GeneXpert MRSA Assay (FDA approved for NASAL specimens only), ?is one component of a comprehensive MRSA colonization surveillance ?program. It is not intended to diagnose MRSA infection nor to guide ?or monitor treatment for MRSA infections. ?Test performance is not FDA approved in patients less than 2 years ?old. ?Performed at [facility], [address] [place], [place] ?[address] ?  ?Culture, blood (Routine X 2) w Reflex to ID Panel     Status: None  ? Collection Time: 12/28/21  4:35 PM  ? Specimen: BLOOD LEFT HAND   ?Result Value Ref Range Status  ? Specimen Description BLOOD LEFT HAND  Final  ? Special Requests   Final  ?  BOTTLES DRAWN AEROBIC AND ANAEROBIC Blood Culture adequate volume  ? Culture   Final  ?  NO GROWTH 5 DAYS

## 2022-01-07 NOTE — Progress Notes (Signed)
STROKE TEAM PROGRESS NOTE  ? ?SUBJECTIVE (INTERVAL HISTORY) ?Patient is seen in his room with his sister at the bedside.  He has been weaned off ventilatory support but is not yet on trach collar.  He remains quite sleepy but can be aroused and follows simple midline and right sided body commands..   .  Vital signs stable.    ? ?OBJECTIVE ?Temp:  [98.8 ?F (37.1 ?C)-101.1 ?F (38.4 ?C)] 99 ?F (37.2 ?C) (04/27 1200) ?Pulse Rate:  [99-120] 113 (04/27 1200) ?Cardiac Rhythm: Normal sinus rhythm (04/27 0800) ?Resp:  [20-35] 29 (04/27 1200) ?BP: (126-167)/(82-99) 137/82 (04/27 1200) ?SpO2:  [91 %-100 %] 91 % (04/27 1200) ?FiO2 (%):  [40 %] 40 % (04/27 1137) ? ?Recent Labs  ?Lab 01/06/22 ?1947 01/06/22 ?2339 01/07/22 ?2947 01/07/22 ?0818 01/07/22 ?1117  ?GLUCAP 125* 138* 134* 112* 141*  ? ?Recent Labs  ?Lab 01/03/22 ?1020 01/04/22 ?0801 01/05/22 ?0423 01/06/22 ?6546 01/07/22 ?5035  ?NA 146* 148* 144 147* 149*  ?K 3.8 4.2 4.1 3.7 4.1  ?CL 111 112* 109 113* 112*  ?CO2 27 29 25 28 30   ?GLUCOSE 145* 135* 94 119* 141*  ?BUN 28* 30* 34* 34* 29*  ?CREATININE 1.18 1.25* 1.30* 1.23 1.26*  ?CALCIUM 8.0* 8.1* 7.9* 8.0* 8.4*  ?MG  --   --  2.4 2.5*  --   ?PHOS  --   --  5.5* 5.0*  --   ? ?Recent Labs  ?Lab 01/01/22 ?0309 01/05/22 ?0423 01/07/22 ?01/09/22  ?AST 40 42* 43*  ?ALT 35 30 30  ?ALKPHOS 57 68 76  ?BILITOT 0.5 0.8 0.7  ?PROT 5.2* 5.7* 6.3*  ?ALBUMIN 1.8* 1.9* 1.8*  ? ?Recent Labs  ?Lab 01/01/22 ?0309 01/02/22 ?0208 01/03/22 ?1020 01/04/22 ?0801 01/05/22 ?0423 01/06/22 ?01/08/22 01/07/22 ?01/09/22  ?WBC 11.2* 10.4 7.0 7.5 7.7 6.2 8.1  ?NEUTROABS 9.3* 7.3  --   --  5.3  --   --   ?HGB 9.2* 9.1* 9.0* 9.7* 9.2* 8.9* 9.9*  ?HCT 30.0* 29.6* 28.4* 31.8* 30.6* 29.2* 32.5*  ?MCV 98.0 97.4 94.7 96.4 98.7 97.3 96.2  ?PLT 448* 395 405* 403* 314 308 377  ? ?No results for input(s): CKTOTAL, CKMB, CKMBINDEX, TROPONINI in the last 168 hours. ?No results for input(s): LABPROT, INR in the last 72 hours. ? ?No results for input(s): COLORURINE, LABSPEC,  PHURINE, GLUCOSEU, HGBUR, BILIRUBINUR, KETONESUR, PROTEINUR, UROBILINOGEN, NITRITE, LEUKOCYTESUR in the last 72 hours. ? ?Invalid input(s): APPERANCEUR ?  ?   ?Component Value Date/Time  ? CHOL 89 12/26/2021 0230  ? TRIG 99 01/07/2022 0633  ? HDL <10 (L) 12/26/2021 0230  ? CHOLHDL NOT CALCULATED 12/26/2021 0230  ? VLDL 43 (H) 12/26/2021 0230  ? LDLCALC NOT CALCULATED 12/26/2021 0230  ? ?Lab Results  ?Component Value Date  ? HGBA1C 6.0 (H) 12/25/2021  ? ?   ?Component Value Date/Time  ? LABOPIA NONE DETECTED 12/26/2021 0116  ? COCAINSCRNUR NONE DETECTED 12/26/2021 0116  ? LABBENZ NONE DETECTED 12/26/2021 0116  ? AMPHETMU NONE DETECTED 12/26/2021 0116  ? THCU NONE DETECTED 12/26/2021 0116  ? LABBARB NONE DETECTED 12/26/2021 0116  ?  ?No results for input(s): ETH in the last 168 hours. ? ?I have personally reviewed the radiological images below and agree with the radiology interpretations. ? ?DG Knee 1-2 Views Right ? ?Result Date: 01/03/2022 ?CLINICAL DATA:  Right knee red and warm to touch. EXAM: RIGHT KNEE - 1-2 VIEW COMPARISON:  None. FINDINGS: Mild superior greater than inferior patellar degenerative osteophytes. Tiny joint  effusion. Mild chronic enthesopathic change at the quadriceps insertion on the patella. No acute fracture or dislocation. IMPRESSION: Tiny joint effusion.  No acute fracture. Electronically Signed   By: Neita Garnet M.D.   On: 01/03/2022 16:17  ? ?DG Abd 1 View ? ?Result Date: 12/25/2021 ?CLINICAL DATA:  Orogastric tube placement. EXAM: ABDOMEN - 1 VIEW COMPARISON:  None. FINDINGS: An orogastric tube is seen with its distal tip overlying the expected region of the gastric antrum. The bowel gas pattern is normal. No radio-opaque calculi or other significant radiographic abnormality are seen. IMPRESSION: Orogastric tube positioning, as described above. Electronically Signed   By: Aram Candela M.D.   On: 12/25/2021 19:09  ? ?DG Abd 1 View ? ?Result Date: 12/25/2021 ?CLINICAL DATA:  OG tube  placement. EXAM: ABDOMEN - 1 VIEW COMPARISON:  One view chest x-ray 12:45 p.m. FINDINGS: Gaseous distention of the stomach noted. Bowel gas pattern otherwise unremarkable. OG tube is not visualized over the distal esophagus or stomach. IMPRESSION: OG tube is not visualized over the distal esophagus or stomach. Gaseous distention of the stomach. Electronically Signed   By: Marin Roberts M.D.   On: 12/25/2021 19:06  ? ?MR ANGIO HEAD WO CONTRAST ? ?Result Date: 12/26/2021 ?CLINICAL DATA:  Acute infarct. Revascularization of superior right M2 segment. EXAM: MRA HEAD WITHOUT CONTRAST TECHNIQUE: Angiographic images of the Circle of Willis were acquired using MRA technique without intravenous contrast. COMPARISON:  CTA head and neck 12/25/2021.  Revascularization films. FINDINGS: Anterior circulation: The internal carotid arteries are within normal limits from high cervical segments through the ICA termini bilaterally. The A1 and M1 segments are normal. MCA bifurcations are within normal limits. MCA bifurcations are intact. The revascularized superior right M2 segments remain patent. ACA and MCA branch vessels are normal bilaterally. Posterior circulation: The vertebral arteries are codominant. AICA vessels are dominant. The basilar artery is within normal limits. Right posterior cerebral artery originates from basilar tip. Left posterior cerebral artery is of fetal type. A left P1 segment contributes. PCA branch vessels are within normal limits bilaterally. Anatomic variants: None Other: None. IMPRESSION: 1. Normal variant MRA Circle of Willis without evidence for significant proximal stenosis, aneurysm, or branch vessel occlusion. 2. The revascularized superior right M2 segments remain patent. Electronically Signed   By: Marin Roberts M.D.   On: 12/26/2021 16:54  ? ?MR BRAIN WO CONTRAST ? ?Result Date: 01/03/2022 ?CLINICAL DATA:  Stroke follow-up EXAM: MRI HEAD WITHOUT CONTRAST TECHNIQUE: Multiplanar,  multiecho pulse sequences of the brain and surrounding structures were obtained without intravenous contrast. COMPARISON:  12/26/2021 FINDINGS: Brain: Expected evolution of diffusion abnormalities in the right MCA and right PCA territories. Areas of diffusion restriction in the left frontal and parietal white matter of also undergone expected changes. Petechial hemorrhage in the left parietal lesion is slightly diminished. No midline shift or other mass effect. Normal white matter signal, parenchymal volume and CSF spaces. The midline structures are normal. Vascular: Major flow voids are preserved. Skull and upper cervical spine: Normal calvarium and skull base. Visualized upper cervical spine and soft tissues are normal. Sinuses/Orbits:Mastoid effusions. Paranasal sinuses are clear. Normal orbits. IMPRESSION: 1. Expected evolution of subacute ischemia in the right MCA territory, right occipital lobe and left frontal and parietal white matter. 2. Petechial blood in the left parietal lobe. Heidelberg classification 1a: HI1, scattered small petechiae, no mass effect. Electronically Signed   By: Deatra Robinson M.D.   On: 01/03/2022 03:40  ? ?MR BRAIN WO CONTRAST ? ?Result  Date: 12/26/2021 ?CLINICAL DATA:  Status post revascularization of right superior M2 occlusion. EXAM: MRI HEAD WITHOUT CONTRAST TECHNIQUE: Multiplanar, multiecho pulse sequences of the brain and surrounding structures were obtained without intravenous contrast. COMPARISON:  CTA head and neck, CT perfusion, and revascularization images 12/25/21 FINDINGS: Brain: Diffusion-weighted images demonstrate acute hemorrhage within the superior right MCA division involving the right insular cortex and frontal lobe. Diffuse cortical infarct involves the high right parietal cortex. Sparing of the more inferior parietal lobe and superior temporal lobe noted. Focal area of nonhemorrhagic infarct is present in the right occipital pole. Contralateral left acute parietal  and posterior left frontal lobe cortical infarcts are present as well. T2 hyperintensities are associated with the areas of acute/subacute infarction. Focal susceptibility involving the left parietal infarct noted. A

## 2022-01-07 NOTE — Plan of Care (Signed)
Plan of care reviewed with sister at bedside ?

## 2022-01-07 NOTE — Progress Notes (Signed)
Pt placed on 40% ATC and is tolerating well at this time. RN aware. RT to continue to monitor. 

## 2022-01-07 NOTE — Progress Notes (Addendum)
? ?NAME:  Vincent Ferrell, MRN:  [id number], DOB:  06-May-1961, LOS: 13 ?ADMISSION DATE:  12/25/2021, CONSULTATION DATE:  12/25/2021 ?REFERRING MD:  Dr. Corliss Skains, CHIEF COMPLAINT:  left sided weakness  ? ?History of Present Illness:  ?HPI mostly obtained from medical chart review.  Attempted from patient but limited given aphasia.  ? ?61 year old male with prior history of HTN, HLD, and GERD who presented to ER as code stroke.  Patient was at the vet's office when he acutely developed left sided weakness and right gaze deviation at 0945.  Code stroke activated by EMS in addition to code STEMI based on diffuse STE.  Of note, patient recently diagnosed with RLL bronchopneumonia two days ago by his PCP.   ? ?He was evaluated by Neurology and Cardiology in ER, HR in 150s with no chest pain or dyspnea, with EKG not meeting criteria for STEMI but showed mild STE, felt possibly rate related versus possible pericarditis.  Echo and cardiac workup ordered.  CT head showed concern for subacute infarcts in the R PCA territory and two small areas possibly representing calcification or blood, therefore thrombolytics deferred.  CTA head/ neck and perfusion study showed acute right MCA anterior M2 occlusion.   ?Labs significant for WBC 20.6, Hgb 9.2, Hct 26.8, plts 205, normal coags, K 4.9, bicarb 21, glucose 138, BUN 34, sCr 1.83, iCa 1.03, low albumin/ protein, lactic acid 1.5. Trop hs pending.  CXR showing elevation of right hemidiaphragm with pulmonary vascular congestion.  Blood cultures sent.   Temp 100 in ER, remains ST, and initial BP 140/90, and remains on room air > 94%.  He was taken emergently to Neuro IR for mechanical thrombectomy with complete revascularization achieving TICI 3 revascularization.  Patient was extubated post intervention.  Briefly required phenylephrine in PACU.  Received 2L LR and albumin in procedure.  PCCM consulted for further medical assistance given concern for sepsis and possible cardioembolic  event related to stroke. ? ?Patient is currently only able to nod yes/ no to question given expressive aphasia and mostly incomprehensible speech.  Nodded yes to recent fever, cough with productive sputum, and headache.  Nods yes to recent bloody stools.  Did indicate that he started antibiotics from PCP.  Denied current or previous SOB, CP, palpitations, N/V/abd pain, lower leg swelling.   Patient indicates he lives alone, not married, no children, and has living parents out of state.  Non smoker, occasional but not daily ETOH, denies drug abuse.  ? ?Pertinent  Medical History  ?HLT, HLD, GERD, rising PSA level, impaired fasting glucose ? 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  Cards consulted for initial code STEMI with diffuse STE, no STEMI. Required 4 units of pRBCs for lower GI bleed.  ?4/17: Large bloody BM. Bedside colonoscopy with blood throughout colon.  TEE with 1 cm x 0.5 cm vegetation involving both atrial and ventricular sides. Hemoglobin decreased to 6.8. Additional 2 units of pRBCs and FFP given.  ?4/18: Bedside EGD and colonoscopy with evidence of black blood but no source of bleeding identified. MRI of the prostate demonstrated large abscess. Patient taken for drainage by Urology. Cultures obtained.  ?4/19: Propofol switched to Precedex in anticipation of extubation.  ?4/20: Increased agitation with worsening oxygen requirements overnight. Required switch back to Propofol. CXR with atelectasis +/- hypervolemia. Lasix given. TCTS consulted for concern of valvular  insufficiency. Repeat echo with increased peri-cardial effusion size.   ?4/22 no significant issues overnight, remains on propofol infusion due to agitation and anxiety  ?4/23 No major issues overnight, MRI brian with expected stroke evolution. On assessment this  right knee is red and warm to touch  ?4/24 trach ?4/25 PEG ?4/26 stopping heavy sedation looking good on PSV/ failed ATC attempt ?4/17 getting IV lasix  ? ?Interim History / Subjective:  ?No sig change really ? ?Objective   ?Blood pressure (Abnormal) 148/96, pulse (Abnormal) 107, temperature (Abnormal) 100.7 ?F (38.2 ?C), temperature source Axillary, resp. rate (Abnormal) 25, height 5' 11.5" (1.816 m), weight 92.1 kg, SpO2 95 %. ?   ?Vent Mode: PSV;CPAP ?FiO2 (%):  [40 %-45 %] 40 % ?Set Rate:  [20 bmp] 20 bmp ?Vt Set:  [600 mL] 600 mL ?PEEP:  [5 cmH20] 5 cmH20 ?Pressure Support:  [5 cmH20-8 cmH20] 8 cmH20 ?Plateau Pressure:  [17 cmH20-26 cmH20] 26 cmH20  ? ?Intake/Output Summary (Last 24 hours) at 01/07/2022 0838 ?Last data filed at 01/07/2022 0600 ?Gross per 24 hour  ?Intake 2685.87 ml  ?Output 3600 ml  ?Net -914.13 ml  ? ?Filed Weights  ? 12/25/21 1000 12/27/21 0500 01/03/22 0400  ?Weight: 100.8 kg 100.6 kg 92.1 kg  ? ?Examination:  ?General minimally responsive still. Off sedating med infusion since yesterday  ?HENT trach unremarkable  ?Pulm coarse bilateral breath sounds ?Card tachy RRR ?Abd soft; liquid stool from flexiseal ?Ext diffuse anasarca ?Neuro moving right hand a little  ? ? ?Resolved Hospital Problem list   ?Septic Shock, resolved  ?Ileus/partial small bowel obstruction ?Pneumonia  ?Gastritis 2/2 OG tube  ?Right knee erythremia  ?Assessment & Plan:  ?Principal Problem: ?  Endocarditis of mitral valve ?Active Problems: ?  Acute ischemic right MCA stroke (HCC) ?  Sinus tachycardia ?  Acute respiratory failure with hypoxia (HCC) ?  Prostate abscess ?  Acute kidney injury (HCC) ?  Anemia due to GI blood loss ?  Pericardial effusion ?  Encephalopathy acute ?  Severe protein-calorie malnutrition (HCC) ?  Physical deconditioning ?  Diarrhea ? ? ?Culture negative Mitral Valve Endocarditis - confirmed by TEE ?Pericardial Effusion ?- Blood cultures negative throughout admission, however on Doxycycline  PTA ?-source not clear. ? 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 This although unusual was only positive finding ?Plan ?Nafcillin X 6 weeks ? ? ?Acute hypoxic respiratory failure - now s/p trach 4/24 ?-mental status limiting factor to weaning at this point.  ?-CXR demonstrating element of effusion and volume overload ?Plan ?Lasix X 1 today  ?Daily attempt at ATC ?VAP bundle ? ? ?MSSA Prostate Abscess ?- On admission CTA, area of lobulation within the inferolateral portion of the prostate. PSA markedly elevated at 25.7. Previously mildly elevated at 1.6. Due to this, Urology consulted with MRI prostate demonstrating large abscess. S/p transurethral resection on 4/18. Urine cultures growing MSSA ?- Urology has signed off. Will need outpatient follow up ?Plan ?Foley until at least 5/2; then can assess for voiding trials ?ABX as above ? ? ?Acute R MCA, with M2 occlusion s/p mechanical thrombectomy with complete revascularization ?Bilateral Embolic Infarcts  ?Severe metabolic encephalopathy  ?Severe Physical deconditioning  ?- Given MV endocarditis and no prior history of atherosclerotic disease, suspect this to be secondary to septic emboli ?Plan ?Supportive care ?Seizure precautions  ?Stopped sedation ?Will need prolonged PT/OT ?Cut Clonazepam and seroquel in half ? ?Acute blood loss anemia s/p 6 units of pRBC transfusions.  Now resolved 2/2 Lower GI  bleeding ?- Bedside EGD on 4/18, and colonoscopy on 4/17, 4/18 with blood present however source not identified. Notable for gastritis secondary to OG tube but non-bleeding ?Plan ?PPI ?Avoid NSAIDS ?Transfusion goal > 7 ?SCDs; on 5/2 will be 2 weeks s/p GIB & > 2 weeks s/p septic TE @ that point if stable can attempt VTE prophylaxis w/ [place] heparin  ? ?Acute Kidney Injury - improved ?-stable ?Plan ?Avoiding nephrotoxins ?Renal dose meds ?Am chem ? ?Fluid and Electrolyte imbalance: hypernatremia, hyperchloremia ?Water deficit worse. Now 3.5 liters in spite of adding VT  yesterday  ?Plan ?Adjust free water; add D5 at 50 cc/hr to the VT scheduled ?Am chem  ? ?Diffuse mild STE ?Demand cardiac ischemia ?- Suspected to be related to sepsis vs stress demand ?Plan ?tele ? ?Prediabetes with hy

## 2022-01-07 NOTE — Progress Notes (Signed)
Pt placed back on the vent, PSV 10/5 to rest. Pt tolerated 4 hours ATC. RT to continue to monitor. ?

## 2022-01-07 NOTE — Plan of Care (Signed)
?  Problem: Education: ?Goal: Knowledge of disease or condition will improve ?Outcome: Not Progressing ?Goal: Knowledge of secondary prevention will improve (SELECT ALL) ?Outcome: Not Progressing ?  ?Problem: Health Behavior/Discharge Planning: ?Goal: Ability to manage health-related needs will improve ?Outcome: Not Progressing ?  ?Problem: Self-Care: ?Goal: Ability to participate in self-care as condition permits will improve ?Outcome: Not Progressing ?  ?Problem: Nutrition: ?Goal: Risk of aspiration will decrease ?Outcome: Not Progressing ?  ?

## 2022-01-07 NOTE — Progress Notes (Signed)
Pt placed on full ventilator support for night rest. 

## 2022-01-07 NOTE — Progress Notes (Signed)
Nutrition Follow-up ? ?DOCUMENTATION CODES:  ? ?Not applicable ? ?INTERVENTION:  ? ?Tube feeding via OG tube: ?Change to Jevity 1.5 @ 60 ml/h (1440 ml per day) ?Prosource TF 90 ml BID ? ?Provides 2320 kcal, 135 gm protein, 1094 ml free water daily ? ?200 ml free water every 4 hours ?Total free water: 2294 ml  ? ?NUTRITION DIAGNOSIS:  ? ?Inadequate oral intake related to inability to eat as evidenced by NPO status. ?Ongoing.  ? ?GOAL:  ? ?Patient will meet greater than or equal to 90% of their needs ?Met with TF at goal  ? ?MONITOR:  ? ?Vent status, Labs ? ?REASON FOR ASSESSMENT:  ? ?Consult ? (Re-assess for diarrhea) ? ?ASSESSMENT:  ? ?61 y.o. male presented to the ED with L side weakness, facial droop, and R gaze deviation. PMH includes HTN. Pt admitted with R MCA stroke with right MCA M2 occlusion and sepsis. ? ?Pt discussed during ICU rounds and with RN. Pt remains on ventilator support via trach. Pt failed ATC on 4/26, stopped heavy sedation  ? ?Pt with RLL PNA, prostate abscess, acute R MCA, and MV endocarditis.  ? ?Weight:  ?Admission: 100.8 kg ?Current: 92.1 kg with mild-moderate edema ?  ?4/14 - intubated, acute R MCA stroke s/p mechanical thrombectomy ?4/17 - s/p bedside colonoscopy; s/p TEE with MV endocarditis ?4/18 s/p bedside EGD and repeat colonoscopy, large prostate abscess per MRI  ?4/21 s/p cortrak placement; tip distal stomach or pyloric region  ?4/24 s/p trach placement  ? ?Medications reviewed and include: SSI, protonix ?D5 @ 50 ml/hr ? ?Labs reviewed: Na 149 ?A1C: 6 ?CBG's: 112-187 ? ?Current TF:  ?Vital 1.5 @ 55 with ProSource TF x 2 ?Provides: 2060 kcal and 111 grams protein  ? ?Diet Order:   ?Diet Order   ? ?       ?  Diet NPO time specified  Diet effective now       ?  ? ?  ?  ? ?  ? ? ?EDUCATION NEEDS:  ? ?Not appropriate for education at this time ? ?Skin:  Reviewed  ? ?Last BM:  250-300 ml/day via rectal tube ? ?Height:  ? ?Ht Readings from Last 1 Encounters:  ?12/25/21 5' 11.5" (1.816 m)   ? ? ?Weight:  ? ?Wt Readings from Last 1 Encounters:  ?01/03/22 92.1 kg  ? ? ?BMI:  Body mass index is 27.92 kg/m?. ? ?Estimated Nutritional Needs:  ? ?Kcal:  2000-2200 ? ?Protein:  100-115 grams ? ?Fluid:  >/= 2 L ? ? P., RD, LDN, CNSC ?See [facility] for contact information  ? ?

## 2022-01-08 ENCOUNTER — Inpatient Hospital Stay (HOSPITAL_COMMUNITY): Payer: Managed Care, Other (non HMO)

## 2022-01-08 DIAGNOSIS — I059 Rheumatic mitral valve disease, unspecified: Secondary | ICD-10-CM | POA: Diagnosis not present

## 2022-01-08 LAB — MAGNESIUM: Magnesium: 2.4 mg/dL (ref 1.7–2.4)

## 2022-01-08 LAB — GLUCOSE, CAPILLARY
Glucose-Capillary: 135 mg/dL — ABNORMAL HIGH (ref 70–99)
Glucose-Capillary: 142 mg/dL — ABNORMAL HIGH (ref 70–99)
Glucose-Capillary: 144 mg/dL — ABNORMAL HIGH (ref 70–99)
Glucose-Capillary: 152 mg/dL — ABNORMAL HIGH (ref 70–99)
Glucose-Capillary: 162 mg/dL — ABNORMAL HIGH (ref 70–99)
Glucose-Capillary: 165 mg/dL — ABNORMAL HIGH (ref 70–99)
Glucose-Capillary: 179 mg/dL — ABNORMAL HIGH (ref 70–99)

## 2022-01-08 LAB — BASIC METABOLIC PANEL
Anion gap: 8 (ref 5–15)
BUN: 31 mg/dL — ABNORMAL HIGH (ref 8–23)
CO2: 28 mmol/L (ref 22–32)
Calcium: 8.3 mg/dL — ABNORMAL LOW (ref 8.9–10.3)
Chloride: 108 mmol/L (ref 98–111)
Creatinine, Ser: 1.19 mg/dL (ref 0.61–1.24)
GFR, Estimated: >60 mL/min (ref >60)
Glucose, Bld: 159 mg/dL — ABNORMAL HIGH (ref 70–99)
Potassium: 3.8 mmol/L (ref 3.5–5.1)
Sodium: 144 mmol/L (ref 135–145)

## 2022-01-08 MED ORDER — POTASSIUM CHLORIDE 20 MEQ PO PACK
40.0000 meq | PACK | Freq: Once | ORAL | Status: AC
  Administered 2022-01-08: 40 meq
  Filled 2022-01-08: qty 2

## 2022-01-08 MED ORDER — FUROSEMIDE 10 MG/ML IJ SOLN
40.0000 mg | Freq: Once | INTRAMUSCULAR | Status: AC
Start: 2022-01-08 — End: 2022-01-08
  Administered 2022-01-08: 40 mg via INTRAVENOUS
  Filled 2022-01-08: qty 4

## 2022-01-08 NOTE — Progress Notes (Signed)
? ?NAME:  Vincent Ferrell, MRN:  [id number], DOB:  [birth year], LOS: 14 ?ADMISSION DATE:  12/25/2021, CONSULTATION DATE:  12/25/2021 ?REFERRING MD:  Dr. Corliss Skains, CHIEF COMPLAINT:  left sided weakness  ? ?History of Present Illness:  ?HPI mostly obtained from medical chart review.  Attempted from patient but limited given aphasia.  ? ?61 year old male with prior history of HTN, HLD, and GERD who presented to ER as code stroke.  Patient was at the vet's office when he acutely developed left sided weakness and right gaze deviation at 0945.  Code stroke activated by EMS in addition to code STEMI based on diffuse STE.  Of note, patient recently diagnosed with RLL bronchopneumonia two days ago by his PCP.   ? ?He was evaluated by Neurology and Cardiology in ER, HR in 150s with no chest pain or dyspnea, with EKG not meeting criteria for STEMI but showed mild STE, felt possibly rate related versus possible pericarditis.  Echo and cardiac workup ordered.  CT head showed concern for subacute infarcts in the R PCA territory and two small areas possibly representing calcification or blood, therefore thrombolytics deferred.  CTA head/ neck and perfusion study showed acute right MCA anterior M2 occlusion.   ?Labs significant for WBC 20.6, Hgb 9.2, Hct 26.8, plts 205, normal coags, K 4.9, bicarb 21, glucose 138, BUN 34, sCr 1.83, iCa 1.03, low albumin/ protein, lactic acid 1.5. Trop hs pending.  CXR showing elevation of right hemidiaphragm with pulmonary vascular congestion.  Blood cultures sent.   Temp 100 in ER, remains ST, and initial BP 140/90, and remains on room air > 94%.  He was taken emergently to Neuro IR for mechanical thrombectomy with complete revascularization achieving TICI 3 revascularization.  Patient was extubated post intervention.  Briefly required phenylephrine in PACU.  Received 2L LR and albumin in procedure.  PCCM consulted for further medical assistance given concern for sepsis and possible cardioembolic  event related to stroke. ? ?Patient is currently only able to nod yes/ no to question given expressive aphasia and mostly incomprehensible speech.  Nodded yes to recent fever, cough with productive sputum, and headache.  Nods yes to recent bloody stools.  Did indicate that he started antibiotics from PCP.  Denied current or previous SOB, CP, palpitations, N/V/abd pain, lower leg swelling.   Patient indicates he lives alone, not married, no children, and has living parents out of state.  Non smoker, occasional but not daily ETOH, denies drug abuse.  ? ?Pertinent  Medical History  ?HLT, HLD, GERD, rising PSA level, impaired fasting glucose ? 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  Cards consulted for initial code STEMI with diffuse STE, no STEMI. Required 4 units of pRBCs for lower GI bleed.  ?4/17: Large bloody BM. Bedside colonoscopy with blood throughout colon.  TEE with 1 cm x 0.5 cm vegetation involving both atrial and ventricular sides. Hemoglobin decreased to 6.8. Additional 2 units of pRBCs and FFP given.  ?4/18: Bedside EGD and colonoscopy with evidence of black blood but no source of bleeding identified. MRI of the prostate demonstrated large abscess. Patient taken for drainage by Urology. Cultures obtained.  ?4/19: Propofol switched to Precedex in anticipation of extubation.  ?4/20: Increased agitation with worsening oxygen requirements overnight. Required switch back to Propofol. CXR with atelectasis +/- hypervolemia. Lasix given. TCTS consulted for concern of valvular  insufficiency. Repeat echo with increased peri-cardial effusion size.   ?4/22 no significant issues overnight, remains on propofol infusion due to agitation and anxiety  ?4/23 No major issues overnight, MRI brian with expected stroke evolution. On assessment this  right knee is red and warm to touch  ?4/24 trach ?4/25 PEG ?4/26 stopping heavy sedation looking good on PSV/ failed ATC attempt ?4/27 getting IV lasix; tolerated several hours of ATC ? ?Interim History / Subjective:  ?Looks about the same  ? ?Objective   ?Blood pressure 117/86, pulse 100, temperature 99.3 ?F (37.4 ?C), resp. rate 20, height 5' 11.5" (1.816 m), weight 92.1 kg, SpO2 97 %. ?   ?Vent Mode: PRVC ?FiO2 (%):  [40 %] 40 % ?Set Rate:  [20 bmp] 20 bmp ?Vt Set:  [600 mL] 600 mL ?PEEP:  [5 cmH20] 5 cmH20 ?Pressure Support:  [8 cmH20-10 cmH20] 10 cmH20 ?Plateau Pressure:  [12 cmH20-16 cmH20] 12 cmH20  ? ?Intake/Output Summary (Last 24 hours) at 01/08/2022 16100722 ?Last data filed at 01/08/2022 0600 ?Gross per 24 hour  ?Intake 3327.41 ml  ?Output 4150 ml  ?Net -822.59 ml  ? ?Filed Weights  ? 12/25/21 1000 12/27/21 0500 01/03/22 0400  ?Weight: 100.8 kg 100.6 kg 92.1 kg  ? ?Examination:  ?General [id number] year old male still remains on intermittent vent/ATC very debilitated ?HENT trach unremarkable  ?Pulm scattered rhonchi ?Card tachy rrr on MRG ?Ext diffuse anasarca pulses strong. CR brisk ?Abd soft still has liq stool from flexiseal  ?Neuro following commands w/ right hand. Not really doing anything else. Left sided hemiparesis  ?GU cl yellow ? ?Resolved Hospital Problem list   ?Septic Shock, resolved  ?Ileus/partial small bowel obstruction ?Pneumonia  ?Gastritis 2/2 OG tube  ?Right knee erythremia  ?Assessment & Plan:  ?Principal Problem: ?  Endocarditis of mitral valve ?Active Problems: ?  Acute ischemic right MCA stroke (HCC) ?  Sinus tachycardia ?  Acute respiratory failure with hypoxia (HCC) ?  Prostate abscess ?  Acute kidney injury (HCC) ?  Anemia due to GI blood loss ?  Pericardial effusion ?  Encephalopathy acute ?  Severe protein-calorie malnutrition (HCC) ?  Physical deconditioning ?  Diarrhea ? ? ?Culture negative Mitral Valve Endocarditis - confirmed by TEE ?Pericardial Effusion ?- Blood cultures negative  throughout admission, however on Doxycycline PTA ?-source not clear. ? 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 This although unusual was only positive finding ?Plan ?Naffcillin x 6 weeks ? ?Acute hypoxic respiratory failure - now s/p trach 4/24 ?-mental status limiting factor to weaning at this point.  ?-CXR demonstrating element of effusion and volume overload ?Plan ?Daily assessment for lasix ?VAP bundle ?Working on ATC ? ?MSSA Prostate Abscess ?- On admission CTA, area of lobulation within the inferolateral portion of the prostate. PSA markedly elevated at 25.7. Previously mildly elevated at 1.6. Due to this, Urology consulted with MRI prostate demonstrating large abscess. S/p transurethral resection on 4/18. Urine cultures growing MSSA ?- Urology has signed off. Will need outpatient follow up ?Plan ?Foley until at least 5/2 then can assess voiding trials ?Abx as above  ? ?Acute R MCA, with M2 occlusion s/p mechanical thrombectomy with complete revascularization ?Bilateral Embolic Infarcts  ?Severe metabolic encephalopathy  ?Severe Physical deconditioning  ?- Given MV endocarditis and no prior history of atherosclerotic disease, suspect this to be secondary to septic emboli ?Plan ?Supportive care ?Seizure precautions ?Stopped sedation  ?Will need prolonged PT/OT ?We cut clonazepam and seroquel in 1/2 on 4/27; may consider further reduction  4/29 ? ?Acute blood loss anemia s/p 6 units of pRBC transfusions.  Now resolved 2/2 Lower GI bleeding ?- Bedside EGD on 4/18, and colonoscopy on 4/17, 4/18 with blood present however source not identified. Notable for gastritis secondary to OG tube but non-bleeding ?Plan ?PPI ?Avoid NSAIDs ?Transfuse for gaol > 7 ?SCDs for now. On 5/2 will be two weeks s/p GIB and septic TE @ that point will plan of starting [place] heparin  ? ?Acute Kidney Injury - improved ?-stable ?Plan ?Avoiding nephrotoxins ? ?Fluid and Electrolyte imbalance: hypernatremia, hyperchloremia ?Water deficit  better after free water adjustment  ?Plan ?Dec D5w to 10 cc/hr ?Cont free water replacement  ?Am chem  ? ?Diffuse mild STE ?Demand cardiac ischemia ?- Suspected to be related to sepsis vs stress demand ?Plan ?tele

## 2022-01-08 NOTE — Progress Notes (Addendum)
STROKE TEAM PROGRESS NOTE  ? ?SUBJECTIVE (INTERVAL HISTORY) ?Patient is seen in his room with his sister at the bedside.  He is on trach collar today and is doing well.  He is hemodynamically stable and his neurological exam is unchanged.  He is drowsy but arousable and follows midline and right-sided body commands.  Continues to have dense left hemiplegia ? ?OBJECTIVE ?Temp:  [98.6 ?F (37 ?C)-100.5 ?F (38.1 ?C)] 98.6 ?F (37 ?C) (04/28 1200) ?Pulse Rate:  [98-120] 112 (04/28 1300) ?Cardiac Rhythm: Sinus tachycardia (04/28 0800) ?Resp:  [17-36] 27 (04/28 1300) ?BP: (116-165)/(82-109) 122/89 (04/28 1300) ?SpO2:  [92 %-99 %] 92 % (04/28 1300) ?FiO2 (%):  [40 %] 40 % (04/28 1133) ? ?Recent Labs  ?Lab 01/07/22 ?1942 01/08/22 ?0019 01/08/22 ?[id number] 01/08/22 ?[id number] 01/08/22 ?1204  ?GLUCAP 157* 179* 162* 142* 135*  ? ? ?Recent Labs  ?Lab 01/04/22 ?0801 01/05/22 ?0423 01/06/22 ?[id number] 01/07/22 ?[id number] 01/08/22 ?0436  ?NA 148* 144 147* 149* 144  ?K 4.2 4.1 3.7 4.1 3.8  ?CL 112* 109 113* 112* 108  ?CO2 29 25 28 30 28   ?GLUCOSE 135* 94 119* 141* 159*  ?BUN 30* 34* 34* 29* 31*  ?CREATININE 1.25* 1.30* 1.23 1.26* 1.19  ?CALCIUM 8.1* 7.9* 8.0* 8.4* 8.3*  ?MG  --  2.4 2.5*  --  2.4  ?PHOS  --  5.5* 5.0*  --   --   ? ? ?Recent Labs  ?Lab 01/05/22 ?0423 01/07/22 ?[id number]  ?AST 42* 43*  ?ALT 30 30  ?ALKPHOS 68 76  ?BILITOT 0.8 0.7  ?PROT 5.7* 6.3*  ?ALBUMIN 1.9* 1.8*  ? ? ?Recent Labs  ?Lab 01/02/22 ?0208 01/03/22 ?1020 01/04/22 ?0801 01/05/22 ?0423 01/06/22 ?[id number] 01/07/22 ?[id number]  ?WBC 10.4 7.0 7.5 7.7 6.2 8.1  ?NEUTROABS 7.3  --   --  5.3  --   --   ?HGB 9.1* 9.0* 9.7* 9.2* 8.9* 9.9*  ?HCT 29.6* 28.4* 31.8* 30.6* 29.2* 32.5*  ?MCV 97.4 94.7 96.4 98.7 97.3 96.2  ?PLT 395 405* 403* 314 308 377  ? ? ?No results for input(s): CKTOTAL, CKMB, CKMBINDEX, TROPONINI in the last 168 hours. ?No results for input(s): LABPROT, INR in the last 72 hours. ? ?No results for input(s): COLORURINE, LABSPEC, [place], GLUCOSEU, HGBUR, BILIRUBINUR, KETONESUR,  PROTEINUR, UROBILINOGEN, NITRITE, LEUKOCYTESUR in the last 72 hours. ? ?Invalid input(s): APPERANCEUR ?  ?   ?Component Value Date/Time  ? CHOL 89 12/26/2021 0230  ? TRIG 99 01/07/2022 0633  ? HDL <10 (L) 12/26/2021 0230  ? CHOLHDL NOT CALCULATED 12/26/2021 0230  ? VLDL 43 (H) 12/26/2021 0230  ? [place] NOT CALCULATED 12/26/2021 0230  ? ?Lab Results  ?Component Value Date  ? HGBA1C 6.0 (H) 12/25/2021  ? ?   ?Component Value Date/Time  ? LABOPIA NONE DETECTED 12/26/2021 0116  ? COCAINSCRNUR NONE DETECTED 12/26/2021 0116  ? LABBENZ NONE DETECTED 12/26/2021 0116  ? AMPHETMU NONE DETECTED 12/26/2021 0116  ? THCU NONE DETECTED 12/26/2021 0116  ? LABBARB NONE DETECTED 12/26/2021 0116  ?  ?No results for input(s): ETH in the last 168 hours. ? ?I have personally reviewed the radiological images below and agree with the radiology interpretations. ? ?DG Knee 1-2 Views Right ? ?Result Date: 01/03/2022 ?CLINICAL DATA:  Right knee red and warm to touch. EXAM: RIGHT KNEE - 1-2 VIEW COMPARISON:  None. FINDINGS: Mild superior greater than inferior patellar degenerative osteophytes. Tiny joint effusion. Mild chronic enthesopathic change at the quadriceps insertion on the patella. No acute fracture or  dislocation. IMPRESSION: Tiny joint effusion.  No acute fracture. Electronically Signed   By: Yvonne Kendall M.D.   On: 01/03/2022 16:17  ? ?DG Abd 1 View ? ?Result Date: 12/25/2021 ?CLINICAL DATA:  Orogastric tube placement. EXAM: ABDOMEN - 1 VIEW COMPARISON:  None. FINDINGS: An orogastric tube is seen with its distal tip overlying the expected region of the gastric antrum. The bowel gas pattern is normal. No radio-opaque calculi or other significant radiographic abnormality are seen. IMPRESSION: Orogastric tube positioning, as described above. Electronically Signed   By: Virgina Norfolk M.D.   On: 12/25/2021 19:09  ? ?DG Abd 1 View ? ?Result Date: 12/25/2021 ?CLINICAL DATA:  OG tube placement. EXAM: ABDOMEN - 1 VIEW COMPARISON:  One  view chest x-ray 12:45 p.m. FINDINGS: Gaseous distention of the stomach noted. Bowel gas pattern otherwise unremarkable. OG tube is not visualized over the distal esophagus or stomach. IMPRESSION: OG tube is not visualized over the distal esophagus or stomach. Gaseous distention of the stomach. Electronically Signed   By: San Morelle M.D.   On: 12/25/2021 19:06  ? ?MR ANGIO HEAD WO CONTRAST ? ?Result Date: 12/26/2021 ?CLINICAL DATA:  Acute infarct. Revascularization of superior right M2 segment. EXAM: MRA HEAD WITHOUT CONTRAST TECHNIQUE: Angiographic images of the Circle of Willis were acquired using MRA technique without intravenous contrast. COMPARISON:  CTA head and neck 12/25/2021.  Revascularization films. FINDINGS: Anterior circulation: The internal carotid arteries are within normal limits from high cervical segments through the ICA termini bilaterally. The A1 and M1 segments are normal. MCA bifurcations are within normal limits. MCA bifurcations are intact. The revascularized superior right M2 segments remain patent. ACA and MCA branch vessels are normal bilaterally. Posterior circulation: The vertebral arteries are codominant. AICA vessels are dominant. The basilar artery is within normal limits. Right posterior cerebral artery originates from basilar tip. Left posterior cerebral artery is of fetal type. A left P1 segment contributes. PCA branch vessels are within normal limits bilaterally. Anatomic variants: None Other: None. IMPRESSION: 1. Normal variant MRA Circle of Willis without evidence for significant proximal stenosis, aneurysm, or branch vessel occlusion. 2. The revascularized superior right M2 segments remain patent. Electronically Signed   By: San Morelle M.D.   On: 12/26/2021 16:54  ? ?MR BRAIN WO CONTRAST ? ?Result Date: 01/03/2022 ?CLINICAL DATA:  Stroke follow-up EXAM: MRI HEAD WITHOUT CONTRAST TECHNIQUE: Multiplanar, multiecho pulse sequences of the brain and surrounding  structures were obtained without intravenous contrast. COMPARISON:  12/26/2021 FINDINGS: Brain: Expected evolution of diffusion abnormalities in the right MCA and right PCA territories. Areas of diffusion restriction in the left frontal and parietal white matter of also undergone expected changes. Petechial hemorrhage in the left parietal lesion is slightly diminished. No midline shift or other mass effect. Normal white matter signal, parenchymal volume and CSF spaces. The midline structures are normal. Vascular: Major flow voids are preserved. Skull and upper cervical spine: Normal calvarium and skull base. Visualized upper cervical spine and soft tissues are normal. Sinuses/Orbits:Mastoid effusions. Paranasal sinuses are clear. Normal orbits. IMPRESSION: 1. Expected evolution of subacute ischemia in the right MCA territory, right occipital lobe and left frontal and parietal white matter. 2. Petechial blood in the left parietal lobe. Heidelberg classification 1a: HI1, scattered small petechiae, no mass effect. Electronically Signed   By: Ulyses Jarred M.D.   On: 01/03/2022 03:40  ? ?MR BRAIN WO CONTRAST ? ?Result Date: 12/26/2021 ?CLINICAL DATA:  Status post revascularization of right superior M2 occlusion. EXAM: MRI HEAD  WITHOUT CONTRAST TECHNIQUE: Multiplanar, multiecho pulse sequences of the brain and surrounding structures were obtained without intravenous contrast. COMPARISON:  CTA head and neck, CT perfusion, and revascularization images 12/25/21 FINDINGS: Brain: Diffusion-weighted images demonstrate acute hemorrhage within the superior right MCA division involving the right insular cortex and frontal lobe. Diffuse cortical infarct involves the high right parietal cortex. Sparing of the more inferior parietal lobe and superior temporal lobe noted. Focal area of nonhemorrhagic infarct is present in the right occipital pole. Contralateral left acute parietal and posterior left frontal lobe cortical infarcts are  present as well. T2 hyperintensities are associated with the areas of acute/subacute infarction. Focal susceptibility involving the left parietal infarct noted. Additional areas of cortical hemorrhage are present i

## 2022-01-08 NOTE — Evaluation (Signed)
[facility] Speaking Valve - Evaluation ?Patient Details  ?Name: Vincent Ferrell ?MRN: [id number] ?Date of Birth: 04-23-1961 ? ?Today's Date: 01/08/2022 ?Time: [id number] ?SLP Time Calculation (min) (ACUTE ONLY): 12 min ? ?Past Medical History:  ?Past Medical History:  ?Diagnosis Date  ? Chest pain in adult 10/24/2017  ? Elevated BP without diagnosis of hypertension 10/24/2017  ? Impaired fasting glucose 10/24/2017  ? Mixed hyperlipidemia 10/24/2017  ? Multifocal pneumonia 12/30/2021  ? Rising PSA level 10/24/2017  ? ?Past Surgical History:  ?Past Surgical History:  ?Procedure Laterality Date  ? COLONOSCOPY WITH PROPOFOL N/A 12/28/2021  ? Procedure: COLONOSCOPY WITH PROPOFOL;  Surgeon: Thornton Park, MD;  Location: [place];  Service: Gastroenterology;  Laterality: N/A;  ? COLONOSCOPY WITH PROPOFOL N/A 12/29/2021  ? Procedure: COLONOSCOPY WITH PROPOFOL;  Surgeon: Thornton Park, MD;  Location: [place];  Service: Gastroenterology;  Laterality: N/A;  ? ESOPHAGOGASTRODUODENOSCOPY (EGD) WITH PROPOFOL N/A 12/29/2021  ? Procedure: ESOPHAGOGASTRODUODENOSCOPY (EGD) WITH PROPOFOL;  Surgeon: Thornton Park, MD;  Location: [facility];  Service: Gastroenterology;  Laterality: N/A;  ? ESOPHAGOGASTRODUODENOSCOPY (EGD) WITH PROPOFOL  01/05/2022  ? Procedure: ESOPHAGOGASTRODUODENOSCOPY (EGD) WITH PROPOFOL;  Surgeon: Jesusita Oka, MD;  Location: [facility];  Service: General;;  ? IR CT HEAD LTD  12/25/2021  ? IR PERCUTANEOUS ART THROMBECTOMY/INFUSION INTRACRANIAL INC DIAG ANGIO  12/25/2021  ? PEG PLACEMENT N/A 01/05/2022  ? Procedure: PERCUTANEOUS ENDOSCOPIC GASTROSTOMY (PEG) PLACEMENT;  Surgeon: Jesusita Oka, MD;  Location: [facility];  Service: General;  Laterality: N/A;  ? RADIOLOGY WITH ANESTHESIA N/A 12/25/2021  ? Procedure: IR WITH ANESTHESIA;  Surgeon: Radiologist, Medication, MD;  Location: [facility];  Service: Radiology;  Laterality: N/A;  ? TRANSURETHRAL RESECTION OF PROSTATE N/A 12/29/2021  ? Procedure:  TRANSURETHRAL RESECTION OF THE PROSTATE (TURP);  Surgeon: Lucas Mallow, MD;  Location: [facility];  Service: Urology;  Laterality: N/A;  ? ?HPI:  ?Pt is a 61 y/o male who presented 4/14 with L side weakness, L facial droop, and R gaze deviation. CT head 4/14: Subacute appearing small infarct in the right PCA territory, occipital pole, at least two small areas of chronic appearing  posterior Left MCA territory encephalomalacia. CTA head/neck and perfusion study showed acute right MCA anterior M2 occlusion. Pt s/p emergent mechanical thrombectomy with complete revascularization 4/14. ETT 4/14-trach 4/24. Question of GIB; pt s/p colonoscopy 4/17 and EGD 4/18 which showed black blood, but no evidence of bleeding. MRI of the prostate demonstrated large abscess; pt s/p transurethral resection of the prostatic abscess on 4/18. G-tube placed 4/25. PMH: HLD, HTN, GERD.  ? ? ?Assessment / Plan / Recommendation ? ?Clinical Impression ? Pt was seen for PMSV evaluation. Pt was educated regarding the anatomy of the larynx, the impact of the trach on voicing, and the goals of the session. Education was very simplified, but the extent of his comprehension is questioned. Pt's cuff was deflated upon SLP's entry. He presented with vitals of RR 27, SpO2 92, and HR 113 at baseline. Including the time time of the speech-language evaluation, he tolerated PMSV for 17 minutes with vitals ranging RR 27-30, SpO2 90-92, and HR 114-115. No evidence of air trapping was noted. Pt did not demonstrate any coughing prior to or after PMSV placement. A slight vocalization was noted once, but no verbalization was demonstrated with PMSV. PSMV was removed after 17 minutes due to progressively increasing WOB and elevation of RR to mid 30s. It is recommended that PMSV be used with SLP only at this time and  SLP will follow pt for treatment. ?SLP Visit Diagnosis: Aphonia (R49.1)  ?  ?SLP Assessment ? Patient needs continued Speech Lanaguage Pathology  Services  ?  ?Recommendations for follow up therapy are one component of a multi-disciplinary discharge planning process, led by the attending physician.  Recommendations may be updated based on patient status, additional functional criteria and insurance authorization. ? ?Follow Up Recommendations ? Skilled nursing-short term rehab (<3 hours/day) ? ?  ?Assistance Recommended at Discharge Frequent or constant Supervision/Assistance  ?Functional Status Assessment Patient has had a recent decline in their functional status and demonstrates the ability to make significant improvements in function in a reasonable and predictable amount of time.  ?Frequency and Duration min 2x/week  ?2 weeks ?  ? ?PMSV Trial PMSV was placed for: 17 ?Able to redirect subglottic air through upper airway:  (no attempts; but no back pressure noted) ?Able to Attain Phonation: No attempt to phonate ?Able to Expectorate Secretions: No attempts ?Respirations During Trial:  (27-30; began progressively increasing to mid 30s with increasing WOB and PMSV was doffed) ?SpO2 During Trial:  (90-91) ?Pulse During Trial:  (114-115) ?Behavior: Alert;No attempt to communicate ?  ?Tracheostomy Tube ?    ?  ?Vent Dependency ? FiO2 (%): 40 %  ?  ?Cuff Deflation Trial Tolerated Cuff Deflation:  (deflated at baseline)  ? ?Vincent Frees I. Vincent Ferrell, [place], CCC-SLP ?Acute Rehabilitation Services ?Office number [phone] ?Pager [phone] ?   ? ? ?  ?[name] ?01/08/2022, 3:23 PM ? ? ? ? ? ?

## 2022-01-08 NOTE — Progress Notes (Signed)
Pt placed on ATC 10L/ 40% and is tolerating well. RT will continue to monitor. ?

## 2022-01-08 NOTE — Evaluation (Signed)
Speech Language Pathology Evaluation ?Patient Details ?Name: Vincent Ferrell ?MRN: [id number] ?DOB: 14-Jul-1961 ?Today's Date: 01/08/2022 ?Time: 4098-1191 ?SLP Time Calculation (min) (ACUTE ONLY): 14 min ? ?Problem List:  ?Patient Active Problem List  ? Diagnosis Date Noted  ? Encephalopathy acute 01/07/2022  ? Severe protein-calorie malnutrition (HCC) 01/07/2022  ? Physical deconditioning 01/07/2022  ? Diarrhea 01/07/2022  ? Pericardial effusion   ? 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 Prostate abscess 12/30/2021  ? Acute kidney injury (HCC) 12/30/2021  ? Anemia due to GI blood loss   ? Acute respiratory failure with hypoxia (HCC)   ? Acute ischemic right MCA stroke (HCC) 12/25/2021  ? Sinus tachycardia   ? Chest pain in adult 10/24/2017  ? Mixed hyperlipidemia 10/24/2017  ? Rising PSA level 10/24/2017  ? Impaired fasting glucose 10/24/2017  ? Elevated BP without diagnosis of hypertension 10/24/2017  ? ?Past Medical History:  ?Past Medical History:  ?Diagnosis Date  ? Chest pain in adult 10/24/2017  ? Elevated BP without diagnosis of hypertension 10/24/2017  ? Impaired fasting glucose 10/24/2017  ? Mixed hyperlipidemia 10/24/2017  ? Multifocal pneumonia 12/30/2021  ? Rising PSA level 10/24/2017  ? ?Past Surgical History:  ?Past Surgical History:  ?Procedure Laterality Date  ? COLONOSCOPY WITH PROPOFOL N/A 12/28/2021  ? Procedure: COLONOSCOPY WITH PROPOFOL;  Surgeon: Tressia Danas, MD;  Location: [facility] ENDOSCOPY;  Service: Gastroenterology;  Laterality: N/A;  ? COLONOSCOPY WITH PROPOFOL N/A 12/29/2021  ? Procedure: COLONOSCOPY WITH PROPOFOL;  Surgeon: Tressia Danas, MD;  Location: [facility] ENDOSCOPY;  Service: Gastroenterology;  Laterality: N/A;  ? ESOPHAGOGASTRODUODENOSCOPY (EGD) WITH PROPOFOL N/A 12/29/2021  ? Procedure: ESOPHAGOGASTRODUODENOSCOPY (EGD) WITH PROPOFOL;  Surgeon: Tressia Danas, MD;  Location: [facility] ENDOSCOPY;  Service: Gastroenterology;  Laterality: N/A;  ? ESOPHAGOGASTRODUODENOSCOPY (EGD) WITH PROPOFOL   01/05/2022  ? Procedure: ESOPHAGOGASTRODUODENOSCOPY (EGD) WITH PROPOFOL;  Surgeon: Diamantina Monks, MD;  Location: [facility] ENDOSCOPY;  Service: General;;  ? IR CT HEAD LTD  12/25/2021  ? IR PERCUTANEOUS ART THROMBECTOMY/INFUSION INTRACRANIAL INC DIAG ANGIO  12/25/2021  ? PEG PLACEMENT N/A 01/05/2022  ? Procedure: PERCUTANEOUS ENDOSCOPIC GASTROSTOMY (PEG) PLACEMENT;  Surgeon: Diamantina Monks, MD;  Location: [facility] ENDOSCOPY;  Service: General;  Laterality: N/A;  ? RADIOLOGY WITH ANESTHESIA N/A 12/25/2021  ? Procedure: IR WITH ANESTHESIA;  Surgeon: Radiologist, Medication, MD;  Location: [facility] OR;  Service: Radiology;  Laterality: N/A;  ? TRANSURETHRAL RESECTION OF PROSTATE N/A 12/29/2021  ? Procedure: TRANSURETHRAL RESECTION OF THE PROSTATE (TURP);  Surgeon: Crista Elliot, MD;  Location: [facility] OR;  Service: Urology;  Laterality: N/A;  ? ?HPI:  ?Pt is a 61 y/o male who presented 4/14 with L side weakness, L facial droop, and R gaze deviation. Aphasia and dysathria also noted in the ED by RN. CT head 4/14: Subacute appearing small infarct in the right PCA territory, occipital pole, at least two small areas of chronic appearing  posterior Left MCA territory encephalomalacia. CTA head/neck and perfusion study showed acute right MCA anterior M2 occlusion. Pt s/p emergent mechanical thrombectomy with complete revascularization 4/14. ETT 4/14-trach 4/24. MRI brain 4/23: Expected evolution of subacute ischemia in the right MCA  territory, right occipital lobe and left frontal and parietal white matter. Petechial blood in the left parietal lobe. Question of GIB; pt s/p colonoscopy 4/17 and EGD 4/18 which showed black blood, but no evidence of bleeding. MRI of the prostate demonstrated large abscess; pt s/p transurethral resection of the prostatic abscess on 4/18. G-tube placed 4/25. PMH: HLD, HTN, GERD.  ? ?  Assessment / Plan / Recommendation ?Clinical Impression ? Pt was seen for speech-language evaluation. He was alert and PMSV was donned  throughout the evaluation. Pt has been using hand movement to indicate yes and no. When pt was asked to show SLP "yes" he consistently exhibited R fisted-hand elevation and for "no" he exhibited wrist elevation/fisted-hand depression. This method was used for assessment of auditory comprehension of yes/no questions. He initially demonstrated 80% accuracy with simple yes/no questions, but this subsequently declined to 40% with the same level of questions. Overall, there appears to be some perseveration on "yes" responses, but accuracy was consistently lower with more complex yes/no questions. Pt was able to inconsistently follow simple 1-step commands, but accuracy was not observed with 2-step commands. No verbalization was noted despite use of facilitative techniques and provision of prompts. A single vocalization was noted, but the intentionality of this is questioned. A communication was board was shown to the pt and left in the room to augment communication, but no visual tracking was observed throughout the evaluation and pointing appeared to be quite challenging. The current functional utility of this is therefore questioned at this time, but this may be useful to augment communication as pt progresses. Skilled SLP services are clinically indicated at this time to improve communication. ?   ?SLP Assessment ? SLP Recommendation/Assessment: Patient needs continued Speech Lanaguage Pathology Services ?SLP Visit Diagnosis: Aphasia (R47.01)  ?  ?Recommendations for follow up therapy are one component of a multi-disciplinary discharge planning process, led by the attending physician.  Recommendations may be updated based on patient status, additional functional criteria and insurance authorization. ?   ?Follow Up Recommendations ? Skilled nursing-short term rehab (<3 hours/day)  ?  ?Assistance Recommended at Discharge ? Frequent or constant Supervision/Assistance  ?Functional Status Assessment Patient has had a  recent decline in their functional status and demonstrates the ability to make significant improvements in function in a reasonable and predictable amount of time.  ?Frequency and Duration min 2x/week  ?2 weeks ?  ?   ?SLP Evaluation ?Cognition ? Overall Cognitive Status: Difficult to assess  ?  ?   ?Comprehension ? Auditory Comprehension ?Overall Auditory Comprehension: Impaired ?Yes/No Questions: Impaired ?Basic Biographical Questions:  (4/5; then 2/5 with subsequent re-testing) ?Complex Questions:  (2/5) ?Commands: Impaired ?One Step Basic Commands:  (3/7)  ?  ?Expression Expression ?Primary Mode of Expression: Verbal (previously verbal; currently via gestures) ?Verbal Expression ?Overall Verbal Expression: Impaired ?Initiation: Impaired ?Automatic Speech: Counting (0/10) ?Level of Generative/Spontaneous Verbalization:  (no verbalization) ?Repetition: Impaired ?Level of Impairment: Word level (0/4) ?Naming: Impairment ?Confrontation:  (0/3)   ?Oral / Motor ? Motor Speech ?Overall Motor Speech:  (Unable to assess)   ?        ?Alessandria Henken I. Vear Clock, MS, CCC-SLP ?Acute Rehabilitation Services ?Office number [phone] ?Pager [phone] ? ?[name] ?01/08/2022, 3:45 PM ? ? ? ?

## 2022-01-08 NOTE — Progress Notes (Signed)
Physical Therapy Treatment ?Patient Details ?Name: Vincent Ferrell ?MRN: [id number] ?DOB: Jan 22, 1961 ?Today's Date: 01/08/2022 ? ? ?History of Present Illness Pt is 61 yo male who presents with L side weakness, L facial droop, and R gaze deviation. He had been dx with bronchopneumonia on 4/11. 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 Pt required intubation on 4/14. Pt with lower GIB, colonoscopy 4/17 and upper GI endoscopy 4/18. Pt underwent Transurethral resection of the prostatic abscess on 4/18. PMH: HLD, HTN, elevated glucose.  Had desats and ETT advanced on 4/20, then underwent trach and bronch on 4/24, and PEG placed 4/25. ? ?  ?PT Comments  ? ? Patient with much improved activation of extremities and level of alertness/awareness today and following commands and communicating with head nods or lifting L hand with "yes"/"no" questions.  Sister in the room and remains supportive.  He is approaching readiness to attempt EOB vs OOB.  Will continue to progress as able.  Recommend OT consult as well.  Remains most appropriate for SNF level rehab at d/c.    ?Recommendations for follow up therapy are one component of a multi-disciplinary discharge planning process, led by the attending physician.  Recommendations may be updated based on patient status, additional functional criteria and insurance authorization. ? ?Follow Up Recommendations ? Skilled nursing-short term rehab (<3 hours/day) ?  ?  ?Assistance Recommended at Discharge Frequent or constant Supervision/Assistance  ?Patient can return home with the following Two people to help with walking and/or transfers;Assistance with cooking/housework;Two people to help with bathing/dressing/bathroom;Assist for transportation;Direct supervision/assist for financial management ?  ?Equipment Recommendations ? Rolling walker (2 wheels);Rollator (4 wheels)  ?  ?Recommendations for Other Services   ? ? ?  ?Precautions / Restrictions Precautions ?Precautions:  Fall ?Precaution Comments: L hemiplegia, trach and PEG  ?  ? ?Mobility ? Bed Mobility ?  ?  ?  ?  ?Supine to sit: Total assist ?  ?  ?General bed mobility comments: dependent for mobility; elevated HOB up into chair position ?  ? ?Transfers ?  ?  ?  ?  ?  ?  ?  ?  ?  ?  ?  ? ?Ambulation/Gait ?  ?  ?  ?  ?  ?  ?  ?  ? ? ?Stairs ?  ?  ?  ?  ?  ? ? ?Wheelchair Mobility ?  ? ?Modified Rankin (Stroke Patients Only) ?Modified Rankin (Stroke Patients Only) ?Pre-Morbid Rankin Score: No symptoms ?Modified Rankin: Severe disability ? ? ?  ?Balance   ?  ?  ?  ?  ?  ?  ?  ?  ?  ?  ?  ?  ?  ?  ?  ?  ?  ?  ?  ? ?  ?Cognition Arousal/Alertness: Awake/alert ?Behavior During Therapy: Flat affect ?Overall Cognitive Status: Difficult to assess ?  ?  ?  ?  ?  ?  ?  ?  ?  ?  ?  ?  ?  ?  ?  ?  ?General Comments: following commands today and using head nods/lifting L hand to communicate "yes"/"no" ?  ?  ? ?  ?Exercises General Exercises - Upper Extremity ?Shoulder Flexion: AAROM, Both, Supine ?Shoulder ABduction: PROM, Left, Supine ?Elbow Flexion: AAROM, PROM, Both, Supine ?Elbow Extension: AAROM, PROM, Both, Supine ?General Exercises - Lower Extremity ?Ankle Circles/Pumps: AROM, AAROM, Both, Supine ?Short Arc Quad: AROM, AAROM, Both, Supine ?Heel Slides: AAROM, Both, Supine ? ?  ?  General Comments General comments (skin integrity, edema, etc.): on trach collar this am 10L 40% FiO2 VSS throughout ?  ?  ? ?Pertinent Vitals/Pain Pain Assessment ?Pain Assessment: Faces ?Faces Pain Scale: Hurts little more ?Pain Location: neck with head rotation L ?Pain Descriptors / Indicators: Grimacing ?Pain Intervention(s): Monitored during session, Repositioned  ? ? ?Home Living   ?  ?  ?  ?  ?  ?  ?  ?  ?  ?   ?  ?Prior Function    ?  ?  ?   ? ?PT Goals (current goals can now be found in the care plan section)   ? ?  ?Frequency ? ? ? Min 3X/week ? ? ? ?  ?PT Plan Current plan remains appropriate  ? ? ?Co-evaluation   ?  ?  ?  ?  ? ?  ?AM-PAC PT "6  Clicks" Mobility   ?Outcome Measure ? Help needed turning from your back to your side while in a flat bed without using bedrails?: Total ?Help needed moving from lying on your back to sitting on the side of a flat bed without using bedrails?: Total ?Help needed moving to and from a bed to a chair (including a wheelchair)?: Total ?Help needed standing up from a chair using your arms (e.g., wheelchair or bedside chair)?: Total ?Help needed to walk in hospital room?: Total ?Help needed climbing 3-5 steps with a railing? : Total ?6 Click Score: 6 ? ?  ?End of Session Equipment Utilized During Treatment: Oxygen ?  ?Patient left: in bed;with call bell/phone within reach;with family/visitor present (bed in chair position) ?  ?PT Visit Diagnosis: Other abnormalities of gait and mobility (R26.89);Muscle weakness (generalized) (M62.81);Other symptoms and signs involving the nervous system (R29.898);Hemiplegia and hemiparesis ?Hemiplegia - Right/Left: Left ?Hemiplegia - dominant/non-dominant: Non-dominant ?Hemiplegia - caused by: Cerebral infarction ?  ? ? ?Time: 1050-1110 ?PT Time Calculation (min) (ACUTE ONLY): 20 min ? ?Charges:  $Therapeutic Activity: 8-22 mins          ?          ? ?Sheran Lawless, PT ?Acute Rehabilitation Services ?Pager:[phone] ?[phone] ?01/08/2022 ? ? ? ?Elray Mcgregor ?01/08/2022, 12:34 PM ? ?

## 2022-01-09 DIAGNOSIS — N179 Acute kidney failure, unspecified: Secondary | ICD-10-CM | POA: Diagnosis not present

## 2022-01-09 DIAGNOSIS — I63511 Cerebral infarction due to unspecified occlusion or stenosis of right middle cerebral artery: Secondary | ICD-10-CM | POA: Diagnosis not present

## 2022-01-09 DIAGNOSIS — I3139 Other pericardial effusion (noninflammatory): Secondary | ICD-10-CM

## 2022-01-09 DIAGNOSIS — J9601 Acute respiratory failure with hypoxia: Secondary | ICD-10-CM | POA: Diagnosis not present

## 2022-01-09 DIAGNOSIS — G9340 Encephalopathy, unspecified: Secondary | ICD-10-CM

## 2022-01-09 DIAGNOSIS — I059 Rheumatic mitral valve disease, unspecified: Secondary | ICD-10-CM | POA: Diagnosis not present

## 2022-01-09 LAB — CBC
HCT: 31.7 % — ABNORMAL LOW (ref 39.0–52.0)
Hemoglobin: 9.8 g/dL — ABNORMAL LOW (ref 13.0–17.0)
MCH: 29.3 pg (ref 26.0–34.0)
MCHC: 30.9 g/dL (ref 30.0–36.0)
MCV: 94.6 fL (ref 80.0–100.0)
Platelets: 393 10*3/uL (ref 150–400)
RBC: 3.35 MIL/uL — ABNORMAL LOW (ref 4.22–5.81)
RDW: 14.5 % (ref 11.5–15.5)
WBC: 7.6 10*3/uL (ref 4.0–10.5)
nRBC: 0 % (ref 0.0–0.2)

## 2022-01-09 LAB — BASIC METABOLIC PANEL
Anion gap: 8 (ref 5–15)
BUN: 36 mg/dL — ABNORMAL HIGH (ref 8–23)
CO2: 29 mmol/L (ref 22–32)
Calcium: 8.5 mg/dL — ABNORMAL LOW (ref 8.9–10.3)
Chloride: 107 mmol/L (ref 98–111)
Creatinine, Ser: 1.3 mg/dL — ABNORMAL HIGH (ref 0.61–1.24)
GFR, Estimated: >60 mL/min (ref >60)
Glucose, Bld: 141 mg/dL — ABNORMAL HIGH (ref 70–99)
Potassium: 3.9 mmol/L (ref 3.5–5.1)
Sodium: 144 mmol/L (ref 135–145)

## 2022-01-09 LAB — GLUCOSE, CAPILLARY
Glucose-Capillary: 129 mg/dL — ABNORMAL HIGH (ref 70–99)
Glucose-Capillary: 130 mg/dL — ABNORMAL HIGH (ref 70–99)
Glucose-Capillary: 143 mg/dL — ABNORMAL HIGH (ref 70–99)
Glucose-Capillary: 144 mg/dL — ABNORMAL HIGH (ref 70–99)
Glucose-Capillary: 153 mg/dL — ABNORMAL HIGH (ref 70–99)
Glucose-Capillary: 154 mg/dL — ABNORMAL HIGH (ref 70–99)

## 2022-01-09 MED ORDER — LISINOPRIL 5 MG PO TABS
5.0000 mg | ORAL_TABLET | Freq: Every day | ORAL | Status: DC
  Administered 2022-01-09 – 2022-01-11 (×3): 5 mg
  Filled 2022-01-09 (×3): qty 1

## 2022-01-09 MED ORDER — FUROSEMIDE 10 MG/ML IJ SOLN
40.0000 mg | Freq: Once | INTRAMUSCULAR | Status: AC
  Administered 2022-01-09: 40 mg via INTRAVENOUS
  Filled 2022-01-09: qty 4

## 2022-01-09 MED ORDER — QUETIAPINE FUMARATE 25 MG PO TABS
25.0000 mg | ORAL_TABLET | Freq: Every day | ORAL | Status: DC
  Administered 2022-01-09 – 2022-01-10 (×2): 25 mg via NASOGASTRIC
  Filled 2022-01-09 (×2): qty 1

## 2022-01-09 MED ORDER — CLONAZEPAM 0.25 MG PO TBDP: 0.2500 mg | ORAL_TABLET | Freq: Two times a day (BID) | ORAL | Status: DC | PRN

## 2022-01-09 NOTE — Progress Notes (Addendum)
STROKE TEAM PROGRESS NOTE  ? ?SUBJECTIVE (INTERVAL HISTORY) ?Patient is seen in his room with his sister at the bedside.  He was able to tolerate 8 hours of trach collar yesterday and will try another trach collar trial today.  He has been hemodynamically stable and his neurological exam is unchanged. ? ?OBJECTIVE ?Temp:  [98.3 ?F (36.8 ?C)-99.7 ?F (37.6 ?C)] 98.5 ?F (36.9 ?C) (04/29 1200) ?Pulse Rate:  [97-122] 116 (04/29 1408) ?Cardiac Rhythm: Sinus tachycardia (04/29 0800) ?Resp:  [20-27] 27 (04/29 1200) ?BP: (113-150)/(77-98) 141/98 (04/29 1200) ?SpO2:  [90 %-98 %] 98 % (04/29 1408) ?FiO2 (%):  [40 %] 40 % (04/29 1408) ? ?Recent Labs  ?Lab 01/08/22 ?1941 01/08/22 ?2317 01/09/22 ?0350 01/09/22 ?0732 01/09/22 ?1106  ?GLUCAP 165* 152* 144* 153* 130*  ? ? ?Recent Labs  ?Lab 01/05/22 ?0423 01/06/22 ?1962 01/07/22 ?2297 01/08/22 ?9892 01/09/22 ?0217  ?NA 144 147* 149* 144 144  ?K 4.1 3.7 4.1 3.8 3.9  ?CL 109 113* 112* 108 107  ?CO2 25 28 30 28 29   ?GLUCOSE 94 119* 141* 159* 141*  ?BUN 34* 34* 29* 31* 36*  ?CREATININE 1.30* 1.23 1.26* 1.19 1.30*  ?CALCIUM 7.9* 8.0* 8.4* 8.3* 8.5*  ?MG 2.4 2.5*  --  2.4  --   ?PHOS 5.5* 5.0*  --   --   --   ? ? ?Recent Labs  ?Lab 01/05/22 ?0423 01/07/22 ?01/09/22  ?AST 42* 43*  ?ALT 30 30  ?ALKPHOS 68 76  ?BILITOT 0.8 0.7  ?PROT 5.7* 6.3*  ?ALBUMIN 1.9* 1.8*  ? ? ?Recent Labs  ?Lab 01/04/22 ?0801 01/05/22 ?0423 01/06/22 ?01/08/22 01/07/22 ?01/09/22 01/09/22 ?01/11/22  ?WBC 7.5 7.7 6.2 8.1 7.6  ?NEUTROABS  --  5.3  --   --   --   ?HGB 9.7* 9.2* 8.9* 9.9* 9.8*  ?HCT 31.8* 30.6* 29.2* 32.5* 31.7*  ?MCV 96.4 98.7 97.3 96.2 94.6  ?PLT 403* 314 308 377 393  ? ? ?No results for input(s): CKTOTAL, CKMB, CKMBINDEX, TROPONINI in the last 168 hours. ?No results for input(s): LABPROT, INR in the last 72 hours. ? ?No results for input(s): COLORURINE, LABSPEC, PHURINE, GLUCOSEU, HGBUR, BILIRUBINUR, KETONESUR, PROTEINUR, UROBILINOGEN, NITRITE, LEUKOCYTESUR in the last 72 hours. ? ?Invalid input(s): APPERANCEUR ?  ?    ?Component Value Date/Time  ? CHOL 89 12/26/2021 0230  ? TRIG 99 01/07/2022 0633  ? HDL <10 (L) 12/26/2021 0230  ? CHOLHDL NOT CALCULATED 12/26/2021 0230  ? VLDL 43 (H) 12/26/2021 0230  ? LDLCALC NOT CALCULATED 12/26/2021 0230  ? ?Lab Results  ?Component Value Date  ? HGBA1C 6.0 (H) 12/25/2021  ? ?   ?Component Value Date/Time  ? LABOPIA NONE DETECTED 12/26/2021 0116  ? COCAINSCRNUR NONE DETECTED 12/26/2021 0116  ? LABBENZ NONE DETECTED 12/26/2021 0116  ? AMPHETMU NONE DETECTED 12/26/2021 0116  ? THCU NONE DETECTED 12/26/2021 0116  ? LABBARB NONE DETECTED 12/26/2021 0116  ?  ?No results for input(s): ETH in the last 168 hours. ? ?I have personally reviewed the radiological images below and agree with the radiology interpretations. ? ?DG Knee 1-2 Views Right ? ?Result Date: 01/03/2022 ?CLINICAL DATA:  Right knee red and warm to touch. EXAM: RIGHT KNEE - 1-2 VIEW COMPARISON:  None. FINDINGS: Mild superior greater than inferior patellar degenerative osteophytes. Tiny joint effusion. Mild chronic enthesopathic change at the quadriceps insertion on the patella. No acute fracture or dislocation. IMPRESSION: Tiny joint effusion.  No acute fracture. Electronically Signed   By: 01/05/2022.D.  On: 01/03/2022 16:17  ? ?DG Abd 1 View ? ?Result Date: 12/25/2021 ?CLINICAL DATA:  Orogastric tube placement. EXAM: ABDOMEN - 1 VIEW COMPARISON:  None. FINDINGS: An orogastric tube is seen with its distal tip overlying the expected region of the gastric antrum. The bowel gas pattern is normal. No radio-opaque calculi or other significant radiographic abnormality are seen. IMPRESSION: Orogastric tube positioning, as described above. Electronically Signed   By: Aram Candela M.D.   On: 12/25/2021 19:09  ? ?DG Abd 1 View ? ?Result Date: 12/25/2021 ?CLINICAL DATA:  OG tube placement. EXAM: ABDOMEN - 1 VIEW COMPARISON:  One view chest x-ray 12:45 p.m. FINDINGS: Gaseous distention of the stomach noted. Bowel gas pattern otherwise  unremarkable. OG tube is not visualized over the distal esophagus or stomach. IMPRESSION: OG tube is not visualized over the distal esophagus or stomach. Gaseous distention of the stomach. Electronically Signed   By: Marin Roberts M.D.   On: 12/25/2021 19:06  ? ?MR ANGIO HEAD WO CONTRAST ? ?Result Date: 12/26/2021 ?CLINICAL DATA:  Acute infarct. Revascularization of superior right M2 segment. EXAM: MRA HEAD WITHOUT CONTRAST TECHNIQUE: Angiographic images of the Circle of Willis were acquired using MRA technique without intravenous contrast. COMPARISON:  CTA head and neck 12/25/2021.  Revascularization films. FINDINGS: Anterior circulation: The internal carotid arteries are within normal limits from high cervical segments through the ICA termini bilaterally. The A1 and M1 segments are normal. MCA bifurcations are within normal limits. MCA bifurcations are intact. The revascularized superior right M2 segments remain patent. ACA and MCA branch vessels are normal bilaterally. Posterior circulation: The vertebral arteries are codominant. AICA vessels are dominant. The basilar artery is within normal limits. Right posterior cerebral artery originates from basilar tip. Left posterior cerebral artery is of fetal type. A left P1 segment contributes. PCA branch vessels are within normal limits bilaterally. Anatomic variants: None Other: None. IMPRESSION: 1. Normal variant MRA Circle of Willis without evidence for significant proximal stenosis, aneurysm, or branch vessel occlusion. 2. The revascularized superior right M2 segments remain patent. Electronically Signed   By: Marin Roberts M.D.   On: 12/26/2021 16:54  ? ?MR BRAIN WO CONTRAST ? ?Result Date: 01/03/2022 ?CLINICAL DATA:  Stroke follow-up EXAM: MRI HEAD WITHOUT CONTRAST TECHNIQUE: Multiplanar, multiecho pulse sequences of the brain and surrounding structures were obtained without intravenous contrast. COMPARISON:  12/26/2021 FINDINGS: Brain: Expected  evolution of diffusion abnormalities in the right MCA and right PCA territories. Areas of diffusion restriction in the left frontal and parietal white matter of also undergone expected changes. Petechial hemorrhage in the left parietal lesion is slightly diminished. No midline shift or other mass effect. Normal white matter signal, parenchymal volume and CSF spaces. The midline structures are normal. Vascular: Major flow voids are preserved. Skull and upper cervical spine: Normal calvarium and skull base. Visualized upper cervical spine and soft tissues are normal. Sinuses/Orbits:Mastoid effusions. Paranasal sinuses are clear. Normal orbits. IMPRESSION: 1. Expected evolution of subacute ischemia in the right MCA territory, right occipital lobe and left frontal and parietal white matter. 2. Petechial blood in the left parietal lobe. Heidelberg classification 1a: HI1, scattered small petechiae, no mass effect. Electronically Signed   By: Deatra Robinson M.D.   On: 01/03/2022 03:40  ? ?MR BRAIN WO CONTRAST ? ?Result Date: 12/26/2021 ?CLINICAL DATA:  Status post revascularization of right superior M2 occlusion. EXAM: MRI HEAD WITHOUT CONTRAST TECHNIQUE: Multiplanar, multiecho pulse sequences of the brain and surrounding structures were obtained without intravenous contrast. COMPARISON:  CTA head and neck, CT perfusion, and revascularization images 12/25/21 FINDINGS: Brain: Diffusion-weighted images demonstrate acute hemorrhage within the superior right MCA division involving the right insular cortex and frontal lobe. Diffuse cortical infarct involves the high right parietal cortex. Sparing of the more inferior parietal lobe and superior temporal lobe noted. Focal area of nonhemorrhagic infarct is present in the right occipital pole. Contralateral left acute parietal and posterior left frontal lobe cortical infarcts are present as well. T2 hyperintensities are associated with the areas of acute/subacute infarction. Focal  susceptibility involving the left parietal infarct noted. Additional areas of cortical hemorrhage are present in the high posterior right parietal lobe and in the right frontal operculum. Smaller foci of hemorrha

## 2022-01-09 NOTE — Progress Notes (Signed)
? ?NAME:  Vincent Ferrell, MRN:  [id number], DOB:  20-Nov-1960, LOS: 15 ?ADMISSION DATE:  12/25/2021, CONSULTATION DATE:  12/25/2021 ?REFERRING MD:  Dr. Corliss Skains, CHIEF COMPLAINT:  left sided weakness  ? ?History of Present Illness:  ?HPI mostly obtained from medical chart review.  Attempted from patient but limited given aphasia.  ? ?61 year old male with prior history of HTN, HLD, and GERD who presented to ER as code stroke.  Patient was at the vet's office when he acutely developed left sided weakness and right gaze deviation at 0945.  Code stroke activated by EMS in addition to code STEMI based on diffuse STE.  Of note, patient recently diagnosed with RLL bronchopneumonia two days ago by his PCP.   ? ?He was evaluated by Neurology and Cardiology in ER, HR in 150s with no chest pain or dyspnea, with EKG not meeting criteria for STEMI but showed mild STE, felt possibly rate related versus possible pericarditis.  Echo and cardiac workup ordered.  CT head showed concern for subacute infarcts in the R PCA territory and two small areas possibly representing calcification or blood, therefore thrombolytics deferred.  CTA head/ neck and perfusion study showed acute right MCA anterior M2 occlusion.   ?Labs significant for WBC 20.6, Hgb 9.2, Hct 26.8, plts 205, normal coags, K 4.9, bicarb 21, glucose 138, BUN 34, sCr 1.83, iCa 1.03, low albumin/ protein, lactic acid 1.5. Trop hs pending.  CXR showing elevation of right hemidiaphragm with pulmonary vascular congestion.  Blood cultures sent.   Temp 100 in ER, remains ST, and initial BP 140/90, and remains on room air > 94%.  He was taken emergently to Neuro IR for mechanical thrombectomy with complete revascularization achieving TICI 3 revascularization.  Patient was extubated post intervention.  Briefly required phenylephrine in PACU.  Received 2L LR and albumin in procedure.  PCCM consulted for further medical assistance given concern for sepsis and possible cardioembolic  event related to stroke. ? ?Patient is currently only able to nod yes/ no to question given expressive aphasia and mostly incomprehensible speech.  Nodded yes to recent fever, cough with productive sputum, and headache.  Nods yes to recent bloody stools.  Did indicate that he started antibiotics from PCP.  Denied current or previous SOB, CP, palpitations, N/V/abd pain, lower leg swelling.   Patient indicates he lives alone, not married, no children, and has living parents out of state.  Non smoker, occasional but not daily ETOH, denies drug abuse.  ? ?Pertinent  Medical History  ?HLT, HLD, GERD, rising PSA level, impaired fasting glucose ? 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  Cards consulted for initial code STEMI with diffuse STE, no STEMI. Required 4 units of pRBCs for lower GI bleed.  ?4/17: Large bloody BM. Bedside colonoscopy with blood throughout colon.  TEE with 1 cm x 0.5 cm vegetation involving both atrial and ventricular sides. Hemoglobin decreased to 6.8. Additional 2 units of pRBCs and FFP given.  ?4/18: Bedside EGD and colonoscopy with evidence of black blood but no source of bleeding identified. MRI of the prostate demonstrated large abscess. Patient taken for drainage by Urology. Cultures obtained.  ?4/19: Propofol switched to Precedex in anticipation of extubation.  ?4/20: Increased agitation with worsening oxygen requirements overnight. Required switch back to Propofol. CXR with atelectasis +/- hypervolemia. Lasix given. TCTS consulted for concern of valvular  insufficiency. Repeat echo with increased peri-cardial effusion size.   ?4/22 no significant issues overnight, remains on propofol infusion due to agitation and anxiety  ?4/23 No major issues overnight, MRI brian with expected stroke evolution. On assessment this  right knee is red and warm to touch  ?4/24 trach ?4/25 PEG ?4/26 stopping heavy sedation looking good on PSV/ failed ATC attempt ?4/27 getting IV lasix; tolerated several hours of ATC ?4/29 back on ATC. Family at bedside  ? ?Interim History / Subjective:  ?8hr Atc yesterday ? ?NAEO  ? ?Cr this morning up to 1.3 ? ?Objective   ?Blood pressure (!) 150/95, pulse (!) 104, temperature 98.5 ?F (36.9 ?C), temperature source Axillary, resp. rate (!) 27, height 5' 11.5" (1.816 m), weight 92.1 kg, SpO2 96 %. ?   ?Vent Mode: PRVC ?FiO2 (%):  [40 %] 40 % ?Set Rate:  [20 bmp] 20 bmp ?Vt Set:  [600 mL] 600 mL ?PEEP:  [5 cmH20] 5 cmH20 ?Plateau Pressure:  [14 cmH20-16 cmH20] 16 cmH20  ? ?Intake/Output Summary (Last 24 hours) at 01/09/2022 1115 ?Last data filed at 01/09/2022 1100 ?Gross per 24 hour  ?Intake 2589.48 ml  ?Output 4025 ml  ?Net -1435.52 ml  ? ?Filed Weights  ? 12/25/21 1000 12/27/21 0500 01/03/22 0400  ?Weight: 100.8 kg 100.6 kg 92.1 kg  ? ?Examination:  ?General: ill appearing older adult M reclined in bed, NAD  ?HENT: thick green/yellow secretion from trach. Trach secure. Anicteric sclera.  ?Pulm: Symmetrical chest expansion, even unlabored, shallow on ATC. Some scattered rhonchi  ?Card: tachycardic, regular. Cap refill < 3 sec  ?Ext- R hand digit amputations. No chronic deformity. Anasarca  ?GI: soft, normoactive x4, non-tender. PEG  ?Neuro: L sided hemiplegia. Following R sided commands ?GU: defer  ? ?Resolved Hospital Problem list   ?Septic Shock, resolved  ?Ileus/partial small bowel obstruction ?Pneumonia  ?Gastritis 2/2 OG tube  ?Right knee erythremia  ? ?Assessment & Plan:  ? 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 ?Acute metabolic encephalopathy ?Hypoactive ICU delirium  ?P ?-change seroquel from BID to qHS only.  ?-dc TID clonidine  ?-change SCH BID clonazepam to BID PRN   ?-delirium precautions  ?-bed in chair, face chair to window, facetime with family/friends/dog. During weekday, consider reaching out to the therapy dog group that comes to hospital for staff rounds and see if they are able to visit pts (not sure with this group, groups at other hospitals do pt rounds)  ? ?Acute hypoxic respiratory failure, now s/p tracheostomy ?Pulmonary edema, pleural effusion, ?HCAP with bibasilar ASD  ?P ?-Lasix  ?-VAP, pulm hygiene ?-ATC as tolerated -- goal 24hr+  ?-on nafcillin  ?-will get trach aspirate since 4/29 yellow/green secretions increasing  ? 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  ?P ?-nafcillin x 6wk course  ?-foley until at least 5/2, then void trial ?-outpt uro f/u ? ?ABLA, improved ?-required 6 PRBC. Gastritis when scoped, and blood present, but no obvious bleeding source  ?P ?-PPI ?-trend CBC PRN  ?-SCD only until 5/2 then SQ Heparin  ? ?AKI  ?Hypernatremia -improved  ?Plan ?-minimize nephrotoxins ?-dc D5 ? ?Diffuse mild STE ?Demand cardiac ischemia ?- Suspected to be related to sepsis vs stress demand ?Plan ?tele ? ?Prediabetes with hyperglycemia  ?- Patient's  hemoglobin A1c is 6 ?Plan ?-SSI ? ?Severe protein calorie malnutrition ?Physical debility  ?Plan ?-EN per RDN via PEG  ?-PT/OT/SLP  ? ?Diarrhea ?-thought EN related ?P ?-monitor outpt  ?-fiber supplementation  ? ? ?Best Practice (right click and "Reselect all SmartList Selections" daily)  ? ?Diet/type:tubefeeds via PEG  ?DVT prophylaxis: SCDs ?GI prophylaxis: PPI ?Lines: N/A ?Foley:  Yes. Voiding trials can start on 01/13/2022 ?Code Status:  full code ?Last date of multidisciplinary goals of care discussion: 4/29 Sister and girlfriend updated at bedside ?  ?CRITICAL CARE ?Performed by: Lanier Clam ? ? ?Total critical care time: 40 minutes ? ?Critical care time was exclusive of separately billable  procedures and treating other patients. ?Critical care was necessary to treat or prevent imminent or life-threatening deterioration. ? ?Critical care was time spent personally by me on the following activities: developme

## 2022-01-09 NOTE — Evaluation (Signed)
Occupational Therapy Evaluation ?Patient Details ?Name: Vincent Ferrell ?MRN: [id number] ?DOB: [birth year] ?Today's Date: 01/09/2022 ? ? ?History of Present Illness Pt is 61 yo male who presents with L side weakness, L facial droop, and R gaze deviation. He had been dx with bronchopneumonia on 4/11. 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 Pt required intubation on 4/14. Pt with lower GIB, colonoscopy 4/17 and upper GI endoscopy 4/18. Pt underwent Transurethral resection of the prostatic abscess on 4/18. PMH: HLD, HTN, elevated glucose.  Had desats and ETT advanced on 4/20, then underwent trach and bronch on 4/24, and PEG placed 4/25.  ? ?Clinical Impression ?  ?Patient admitted for the diagnosis above.  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  Deficits impacting independence are listed below.  Currently he is needing total assist for all aspects of self care and mobility at a bedlevel.  OT will follow in the acute setting to maximize his functional status, and SNF is recommended for post acute rehab prior to returning home.      ?   ? ?Recommendations for follow up therapy are one component of a multi-disciplinary discharge planning process, led by the attending physician.  Recommendations may be updated based on patient status, additional functional criteria and insurance authorization.  ? ?Follow Up Recommendations ? Skilled nursing-short term rehab (<3 hours/day)  ?  ?Assistance Recommended at Discharge Frequent or constant Supervision/Assistance  ?Patient can return home with the following   ? ?  ?Functional Status Assessment ? Patient has had a recent decline in their functional status and demonstrates the ability to make significant improvements in function in a reasonable and predictable amount of time.  ?Equipment Recommendations ? Wheelchair (measurements OT);Wheelchair cushion (measurements OT);Hospital bed  ?  ?Recommendations  for Other Services   ? ? ?  ?Precautions / Restrictions Precautions ?Precautions: Fall ?Precaution Comments: L hemiplegia, trach and PEG ?Restrictions ?Weight Bearing Restrictions: No  ? ?  ? ?Mobility Bed Mobility ?Overal bed mobility: Needs Assistance ?Bed Mobility: Rolling ?Rolling: Total assist ?  ?  ?  ?  ?  ?Patient Response: Cooperative ? ?Transfers ?  ?  ?  ?  ?  ?  ?  ?  ?  ?  ?  ? ?  ?Balance Overall balance assessment: Needs assistance ?  ?Sitting balance-Leahy Scale: Zero ?Sitting balance - Comments: pillows to position in chair position in bed ?Postural control: Left lateral lean ?  ?  ?  ?  ?  ?  ?  ?  ?  ?  ?  ?  ?  ?  ?   ? ?ADL either performed or assessed with clinical judgement  ? ?ADL   ?  ?  ?  ?  ?  ?  ?  ?  ?  ?  ?  ?  ?  ?  ?  ?  ?  ?  ?  ?General ADL Comments: Dependent for all self care.  Tube feeding.  ? ? ? ?Vision   ?Vision Assessment?: Yes ?Alignment/Gaze Preference: Gaze right ?Tracking/Visual Pursuits: Unable to hold eye position out of midline ?Convergence: Impaired (comment) ?Additional Comments: Did blink to confrontation both eyes.  ?   ?Perception Perception ?Perception: Impaired ?Inattention/Neglect: Does not attend to left visual field;Does not attend to left side of body ?  ?Praxis Praxis ?Praxis: Not tested ?  ? ?Pertinent Vitals/Pain Pain Assessment ?Pain Assessment: PAINAD ?  Breathing: occasional labored breathing, short period of hyperventilation ?Negative Vocalization: none ?Facial Expression: sad, frightened, frown ?Body Language: relaxed ?Consolability: no need to console ?PAINAD Score: 2 ?Pain Location: neck with head rotation L ?Pain Descriptors / Indicators: Grimacing ?Pain Intervention(s): Monitored during session  ? ? ? ?Hand Dominance Right ?  ?Extremity/Trunk Assessment Upper Extremity Assessment ?Upper Extremity Assessment: RUE deficits/detail ?RUE Deficits / Details: minimal R elbow and hand squeeze noted.  Unable to participate with MMT.  Unable to bring hand  to mouth.  No spontaneous movements noted. ?RUE Coordination: decreased fine motor;decreased gross motor ?LUE Deficits / Details: Flaccid.  Did respond to pain with nail bed squeeze. ?LUE: Subluxation noted ?LUE Sensation: decreased light touch ?LUE Coordination: decreased fine motor;decreased gross motor ?  ?Lower Extremity Assessment ?Lower Extremity Assessment: Defer to PT evaluation ?  ?Cervical / Trunk Assessment ?Cervical / Trunk Assessment: Other exceptions ?Cervical / Trunk Exceptions: difficulty turning head to the L ?  ?Communication Communication ?Communication: Tracheostomy ?  ?Cognition Arousal/Alertness: Lethargic ?Behavior During Therapy: Flat affect ?Overall Cognitive Status: Difficult to assess ?  ?  ?  ?  ?  ?  ?  ?  ?  ?  ?  ?  ?  ?  ?  ?  ?General Comments: inconsistent with hand communication ?  ?  ?General Comments    ? ?  ?Exercises Other Exercises ?Other Exercises: PROM to UE's in supine, head and neck rotation/ flex/ext PROM ?  ?Shoulder Instructions    ? ? ?Home Living Family/patient expects to be discharged to:: Private residence ?Living Arrangements: Alone ?Available Help at Discharge: Available PRN/intermittently ?Type of Home: House ?Home Access: Stairs to enter ?Entrance Stairs-Number of Steps: 3 at front, garage in basement ?  ?Home Layout: Two level;Bed/bath upstairs ?Alternate Level Stairs-Number of Steps: flight ?  ?Bathroom Shower/Tub: Tub/shower unit;Walk-in shower ?  ?Bathroom Toilet: Standard ?  ?  ?Home Equipment: None ?  ?  ?  ? ?  ?Prior Functioning/Environment Prior Level of Function : Driving;Independent/Modified Independent ?  ?  ?  ?  ?  ?  ?Mobility Comments: worked as Chief Financial Officer ?ADLs Comments: PTA no assist with ADL/IADL ?  ? ?  ?  ?OT Problem List: Decreased strength;Decreased range of motion;Decreased activity tolerance;Decreased coordination;Impaired vision/perception;Impaired balance (sitting and/or standing);Decreased knowledge of use of DME or AE;Impaired  sensation;Pain;Impaired UE functional use ?  ?   ?OT Treatment/Interventions: Self-care/ADL training;Therapeutic exercise;Neuromuscular education;DME and/or AE instruction;Therapeutic activities;Balance training;Patient/family education  ?  ?OT Goals(Current goals can be found in the care plan section) Acute Rehab OT Goals ?OT Goal Formulation: Patient unable to participate in goal setting ?Time For Goal Achievement: 01/22/22 ?Potential to Achieve Goals: Good  ?OT Frequency: Min 2X/week ?  ? ?Co-evaluation   ?  ?  ?  ?  ? ?  ?AM-PAC OT "6 Clicks" Daily Activity     ?Outcome Measure Help from another person eating meals?: Total ?Help from another person taking care of personal grooming?: Total ?Help from another person toileting, which includes using toliet, bedpan, or urinal?: Total ?Help from another person bathing (including washing, rinsing, drying)?: Total ?Help from another person to put on and taking off regular upper body clothing?: Total ?Help from another person to put on and taking off regular lower body clothing?: Total ?6 Click Score: 6 ?  ?End of Session Nurse Communication: Mobility status ? ?Activity Tolerance: Patient limited by lethargy ?Patient left: in bed;with family/visitor present ? ?OT Visit Diagnosis: Unsteadiness on feet (R26.81);Muscle  weakness (generalized) (M62.81);Feeding difficulties (R63.3);Low vision, both eyes (H54.2);Hemiplegia and hemiparesis ?Hemiplegia - Right/Left: Left ?Hemiplegia - dominant/non-dominant: Non-Dominant ?Hemiplegia - caused by: Cerebral infarction  ?              ?Time: [id number] ?OT Time Calculation (min): 22 min ?Charges:  OT General Charges ?$OT Visit: 1 Visit ?OT Evaluation ?$OT Eval Moderate Complexity: 1 Mod ? ?01/09/2022 ? ?RP, OTR/L ? ?Acute Rehabilitation Services ? ?Office:  [phone] ? ? ?Orie Cuttino D Asusena Sigley ?01/09/2022, 11:29 AM ?

## 2022-01-10 DIAGNOSIS — D72829 Elevated white blood cell count, unspecified: Secondary | ICD-10-CM | POA: Diagnosis not present

## 2022-01-10 DIAGNOSIS — I059 Rheumatic mitral valve disease, unspecified: Secondary | ICD-10-CM | POA: Diagnosis not present

## 2022-01-10 DIAGNOSIS — N179 Acute kidney failure, unspecified: Secondary | ICD-10-CM | POA: Diagnosis not present

## 2022-01-10 DIAGNOSIS — I63511 Cerebral infarction due to unspecified occlusion or stenosis of right middle cerebral artery: Secondary | ICD-10-CM | POA: Diagnosis not present

## 2022-01-10 LAB — GLUCOSE, CAPILLARY
Glucose-Capillary: 125 mg/dL — ABNORMAL HIGH (ref 70–99)
Glucose-Capillary: 142 mg/dL — ABNORMAL HIGH (ref 70–99)
Glucose-Capillary: 145 mg/dL — ABNORMAL HIGH (ref 70–99)
Glucose-Capillary: 152 mg/dL — ABNORMAL HIGH (ref 70–99)
Glucose-Capillary: 161 mg/dL — ABNORMAL HIGH (ref 70–99)
Glucose-Capillary: 165 mg/dL — ABNORMAL HIGH (ref 70–99)

## 2022-01-10 LAB — BASIC METABOLIC PANEL
Anion gap: 8 (ref 5–15)
BUN: 42 mg/dL — ABNORMAL HIGH (ref 8–23)
CO2: 28 mmol/L (ref 22–32)
Calcium: 8.9 mg/dL (ref 8.9–10.3)
Chloride: 106 mmol/L (ref 98–111)
Creatinine, Ser: 1.38 mg/dL — ABNORMAL HIGH (ref 0.61–1.24)
GFR, Estimated: 58 mL/min — ABNORMAL LOW (ref >60)
Glucose, Bld: 166 mg/dL — ABNORMAL HIGH (ref 70–99)
Potassium: 3.8 mmol/L (ref 3.5–5.1)
Sodium: 142 mmol/L (ref 135–145)

## 2022-01-10 LAB — CBC
HCT: 33.7 % — ABNORMAL LOW (ref 39.0–52.0)
Hemoglobin: 10.6 g/dL — ABNORMAL LOW (ref 13.0–17.0)
MCH: 29.5 pg (ref 26.0–34.0)
MCHC: 31.5 g/dL (ref 30.0–36.0)
MCV: 93.9 fL (ref 80.0–100.0)
Platelets: 418 10*3/uL — ABNORMAL HIGH (ref 150–400)
RBC: 3.59 MIL/uL — ABNORMAL LOW (ref 4.22–5.81)
RDW: 14.3 % (ref 11.5–15.5)
WBC: 8.4 10*3/uL (ref 4.0–10.5)
nRBC: 0 % (ref 0.0–0.2)

## 2022-01-10 MED ORDER — HEPARIN SODIUM (PORCINE) 5000 UNIT/ML IJ SOLN
5000.0000 [IU] | Freq: Three times a day (TID) | INTRAMUSCULAR | Status: DC
  Administered 2022-01-10 – 2022-01-15 (×15): 5000 [IU] via SUBCUTANEOUS
  Filled 2022-01-10 (×15): qty 1

## 2022-01-10 NOTE — Progress Notes (Signed)
? ?NAME:  Vincent Ferrell, MRN:  [id number], DOB:  09/01/1961, LOS: 86 ?ADMISSION DATE:  12/25/2021, CONSULTATION DATE:  12/25/2021 ?REFERRING MD:  Dr. Estanislado Pandy, CHIEF COMPLAINT:  left sided weakness  ? ?History of Present Illness:  ?HPI mostly obtained from medical chart review.  Attempted from patient but limited given aphasia.  ? ?61 year old male with prior history of HTN, HLD, and GERD who presented to ER as code stroke.  Patient was at the vet's office when he acutely developed left sided weakness and right gaze deviation at 0945.  Code stroke activated by EMS in addition to code STEMI based on diffuse STE.  Of note, patient recently diagnosed with RLL bronchopneumonia two days ago by his PCP.   ? ?He was evaluated by Neurology and Cardiology in ER, HR in 150s with no chest pain or dyspnea, with EKG not meeting criteria for STEMI but showed mild STE, felt possibly rate related versus possible pericarditis.  Echo and cardiac workup ordered.  CT head showed concern for subacute infarcts in the R PCA territory and two small areas possibly representing calcification or blood, therefore thrombolytics deferred.  CTA head/ neck and perfusion study showed acute right MCA anterior M2 occlusion.   ?Labs significant for WBC 20.6, Hgb 9.2, Hct 26.8, plts 205, normal coags, K 4.9, bicarb 21, glucose 138, BUN 34, sCr 1.83, iCa 1.03, low albumin/ protein, lactic acid 1.5. Trop hs pending.  CXR showing elevation of right hemidiaphragm with pulmonary vascular congestion.  Blood cultures sent.   Temp 100 in ER, remains ST, and initial BP 140/90, and remains on room air > 94%.  He was taken emergently to Neuro IR for mechanical thrombectomy with complete revascularization achieving TICI 3 revascularization.  Patient was extubated post intervention.  Briefly required phenylephrine in PACU.  Received 2L LR and albumin in procedure.  PCCM consulted for further medical assistance given concern for sepsis and possible cardioembolic  event related to stroke. ? ?Patient is currently only able to nod yes/ no to question given expressive aphasia and mostly incomprehensible speech.  Nodded yes to recent fever, cough with productive sputum, and headache.  Nods yes to recent bloody stools.  Did indicate that he started antibiotics from PCP.  Denied current or previous SOB, CP, palpitations, N/V/abd pain, lower leg swelling.   Patient indicates he lives alone, not married, no children, and has living parents out of state.  Non smoker, occasional but not daily ETOH, denies drug abuse.  ? ?Pertinent  Medical History  ?HLT, HLD, GERD, rising PSA level, impaired fasting glucose ? 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  Cards consulted for initial code STEMI with diffuse STE, no STEMI. Required 4 units of pRBCs for lower GI bleed.  ?4/17: Large bloody BM. Bedside colonoscopy with blood throughout colon.  TEE with 1 cm x 0.5 cm vegetation involving both atrial and ventricular sides. Hemoglobin decreased to 6.8. Additional 2 units of pRBCs and FFP given.  ?4/18: Bedside EGD and colonoscopy with evidence of black blood but no source of bleeding identified. MRI of the prostate demonstrated large abscess. Patient taken for drainage by Urology. Cultures obtained.  ?4/19: Propofol switched to Precedex in anticipation of extubation.  ?4/20: Increased agitation with worsening oxygen requirements overnight. Required switch back to Propofol. CXR with atelectasis +/- hypervolemia. Lasix given. TCTS consulted for concern of valvular  insufficiency. Repeat echo with increased peri-cardial effusion size.   ?4/22 no significant issues overnight, remains on propofol infusion due to agitation and anxiety  ?4/23 No major issues overnight, MRI brian with expected stroke evolution. On assessment this  right knee is red and warm to touch  ?4/24 trach ?4/25 PEG ?4/26 stopping heavy sedation looking good on PSV/ failed ATC attempt ?4/27 getting IV lasix; tolerated several hours of ATC ?4/29 back on ATC. Family at bedside  ? ?Interim History / Subjective:  ?Has been on trach collar for over 24 hours with no distress.  More awake. ? ?Objective   ?Blood pressure 133/87, pulse (!) 112, temperature 98.9 ?F (37.2 ?C), temperature source Axillary, resp. rate (!) 28, height 5' 11.5" (1.816 m), weight 92.1 kg, SpO2 98 %. ?   ?FiO2 (%):  [40 %] 40 %  ? ?Intake/Output Summary (Last 24 hours) at 01/10/2022 1412 ?Last data filed at 01/10/2022 1400 ?Gross per 24 hour  ?Intake 1861.28 ml  ?Output 2650 ml  ?Net -788.72 ml  ? ? ?Filed Weights  ? 12/25/21 1000 12/27/21 0500 01/03/22 0400  ?Weight: 100.8 kg 100.6 kg 92.1 kg  ? ?Examination:  ?General: ill appearing older adult M reclined in bed, NAD  ?HENT: thick green/yellow secretion from trach. Trach secure. Anicteric sclera.  ?Pulm: Symmetrical chest expansion, even unlabored, shallow on ATC.  Chest clear ?Card: tachycardic, regular. Cap refill < 3 sec  ?Ext- R hand digit amputations. No chronic deformity. Anasarca  ?GI: soft, normoactive x4, non-tender. PEG  ?Neuro: L sided hemiplegia. Following R sided commands ?GU: defer  ? ?Resolved Hospital Problem list   ?Septic Shock, resolved  ?Ileus/partial small bowel obstruction ?Pneumonia  ?Gastritis 2/2 OG tube  ?Right knee erythremia  ? ?Assessment & Plan:  ?Pericardial effusion ?Moderate without tamponade on 4/20. Resolved on TTE from 4/25 ? ?Prostate abscess ?Prostatic abscess found on MRI - drained. Growing S.aureus MSSA.  ? ?- Treat 6 weeks with nafcillin.  ?-Clarify duration of Foley catheter with urology. ? ?Acute respiratory failure with hypoxia ([place]) ?Tolerating weaning but mental status/sedation requirements preclude extubation. Doubtful will be able to protect airway given prolonged intubation and recent stroke.  ? ?-Leave on  open ended tracheostomy collar as tolerated. ?-Trach change 5/2 ?-Should be ready to transfer to floor 5/1 ? 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 2 vegetations 1x0.5cm ?Likely cause of stroke ? ?- complete 6 weeks of antibiotics. ?- given minimal MR, recent stroke - will treat medically.  ? ?Acute ischemic right MCA stroke ([place]) ?Left sided weakness and right gaze deviation .  ?Improving and following some commands with sedation off. ? ?-Stroke rehabilitation ?- No anticoagulation at this time given active endocarditis.  ?-Can start chemical DVT prophylaxis ? ?Acute kidney injury ([place]) ?Creatinine is stable at 1.39 ? ?Anemia due to GI blood loss ?No signs of bleeding with stable hemoglobin ? ? ?Best Practice (right click and "Reselect all SmartList Selections" daily)  ? ?Diet/type: tubefeeds via PEG  ?DVT prophylaxis: SCDs ?GI prophylaxis: PPI ?Lines: N/A ?Foley:  Yes. Voiding trials can start on 01/13/2022 (?) ?Code Status:  full code ?Last date of multidisciplinary goals of care discussion: 4/29 Sister and girlfriend updated at bedside ? ?Kipp Brood, MD FRCPC ?ICU Physician ?[place]  ?Pager: [phone] ?Or Epic Secure Chat ?After hours: [phone]. ? ?01/10/2022, 2:16 PM ? ? ? ? ?  ?01/10/2022, 2:12 PM ? ? ?

## 2022-01-10 NOTE — Progress Notes (Addendum)
STROKE TEAM PROGRESS NOTE  ? ?SUBJECTIVE (INTERVAL HISTORY) ?Patient is seen in his room with his sister at the bedside.  He has been able to tolerate 24 hours of trach collar and will likely transfer out of the ICU tomorrow.  He has been hemodynamically stable and his neurological exam is unchanged. ? ?OBJECTIVE ?Temp:  [98.6 ?F (37 ?C)-99.9 ?F (37.7 ?C)] 98.9 ?F (37.2 ?C) (04/30 1158) ?Pulse Rate:  [15-117] 112 (04/30 1300) ?Cardiac Rhythm: Sinus tachycardia (04/30 0800) ?Resp:  [22-36] 28 (04/30 1300) ?BP: (112-150)/(72-102) 133/87 (04/30 1300) ?SpO2:  [89 %-99 %] 98 % (04/30 1300) ?FiO2 (%):  [40 %] 40 % (04/30 1112) ? ?Recent Labs  ?Lab 01/09/22 ?1951 01/09/22 ?2353 01/10/22 ?0410 01/10/22 ?0802 01/10/22 ?1137  ?GLUCAP 143* 154* 142* 152* 165*  ? ? ?Recent Labs  ?Lab 01/05/22 ?0423 01/06/22 ?2423 01/07/22 ?5361 01/08/22 ?4431 01/09/22 ?0217 01/10/22 ?5400  ?NA 144 147* 149* 144 144 142  ?K 4.1 3.7 4.1 3.8 3.9 3.8  ?CL 109 113* 112* 108 107 106  ?CO2 25 28 30 28 29 28   ?GLUCOSE 94 119* 141* 159* 141* 166*  ?BUN 34* 34* 29* 31* 36* 42*  ?CREATININE 1.30* 1.23 1.26* 1.19 1.30* 1.38*  ?CALCIUM 7.9* 8.0* 8.4* 8.3* 8.5* 8.9  ?MG 2.4 2.5*  --  2.4  --   --   ?PHOS 5.5* 5.0*  --   --   --   --   ? ? ?Recent Labs  ?Lab 01/05/22 ?0423 01/07/22 ?01/09/22  ?AST 42* 43*  ?ALT 30 30  ?ALKPHOS 68 76  ?BILITOT 0.8 0.7  ?PROT 5.7* 6.3*  ?ALBUMIN 1.9* 1.8*  ? ? ?Recent Labs  ?Lab 01/05/22 ?0423 01/06/22 ?01/08/22 01/07/22 ?01/09/22 01/09/22 ?01/11/22 01/10/22 ?01/12/22  ?WBC 7.7 6.2 8.1 7.6 8.4  ?NEUTROABS 5.3  --   --   --   --   ?HGB 9.2* 8.9* 9.9* 9.8* 10.6*  ?HCT 30.6* 29.2* 32.5* 31.7* 33.7*  ?MCV 98.7 97.3 96.2 94.6 93.9  ?PLT 314 308 377 393 418*  ? ? ?No results for input(s): CKTOTAL, CKMB, CKMBINDEX, TROPONINI in the last 168 hours. ?No results for input(s): LABPROT, INR in the last 72 hours. ? ?No results for input(s): COLORURINE, LABSPEC, PHURINE, GLUCOSEU, HGBUR, BILIRUBINUR, KETONESUR, PROTEINUR, UROBILINOGEN, NITRITE, LEUKOCYTESUR in  the last 72 hours. ? ?Invalid input(s): APPERANCEUR ?  ?   ?Component Value Date/Time  ? CHOL 89 12/26/2021 0230  ? TRIG 99 01/07/2022 0633  ? HDL <10 (L) 12/26/2021 0230  ? CHOLHDL NOT CALCULATED 12/26/2021 0230  ? VLDL 43 (H) 12/26/2021 0230  ? LDLCALC NOT CALCULATED 12/26/2021 0230  ? ?Lab Results  ?Component Value Date  ? HGBA1C 6.0 (H) 12/25/2021  ? ?   ?Component Value Date/Time  ? LABOPIA NONE DETECTED 12/26/2021 0116  ? COCAINSCRNUR NONE DETECTED 12/26/2021 0116  ? LABBENZ NONE DETECTED 12/26/2021 0116  ? AMPHETMU NONE DETECTED 12/26/2021 0116  ? THCU NONE DETECTED 12/26/2021 0116  ? LABBARB NONE DETECTED 12/26/2021 0116  ?  ?No results for input(s): ETH in the last 168 hours. ? ?I have personally reviewed the radiological images below and agree with the radiology interpretations. ? ?DG Knee 1-2 Views Right ? ?Result Date: 01/03/2022 ?CLINICAL DATA:  Right knee red and warm to touch. EXAM: RIGHT KNEE - 1-2 VIEW COMPARISON:  None. FINDINGS: Mild superior greater than inferior patellar degenerative osteophytes. Tiny joint effusion. Mild chronic enthesopathic change at the quadriceps insertion on the patella. No acute fracture or dislocation.  IMPRESSION: Tiny joint effusion.  No acute fracture. Electronically Signed   By: Neita Garnetonald  Viola M.D.   On: 01/03/2022 16:17  ? ?DG Abd 1 View ? ?Result Date: 12/25/2021 ?CLINICAL DATA:  Orogastric tube placement. EXAM: ABDOMEN - 1 VIEW COMPARISON:  None. FINDINGS: An orogastric tube is seen with its distal tip overlying the expected region of the gastric antrum. The bowel gas pattern is normal. No radio-opaque calculi or other significant radiographic abnormality are seen. IMPRESSION: Orogastric tube positioning, as described above. Electronically Signed   By: Aram Candelahaddeus  Houston M.D.   On: 12/25/2021 19:09  ? ?DG Abd 1 View ? ?Result Date: 12/25/2021 ?CLINICAL DATA:  OG tube placement. EXAM: ABDOMEN - 1 VIEW COMPARISON:  One view chest x-ray 12:45 p.m. FINDINGS: Gaseous  distention of the stomach noted. Bowel gas pattern otherwise unremarkable. OG tube is not visualized over the distal esophagus or stomach. IMPRESSION: OG tube is not visualized over the distal esophagus or stomach. Gaseous distention of the stomach. Electronically Signed   By: Marin Robertshristopher  Mattern M.D.   On: 12/25/2021 19:06  ? ?MR ANGIO HEAD WO CONTRAST ? ?Result Date: 12/26/2021 ?CLINICAL DATA:  Acute infarct. Revascularization of superior right M2 segment. EXAM: MRA HEAD WITHOUT CONTRAST TECHNIQUE: Angiographic images of the Circle of Willis were acquired using MRA technique without intravenous contrast. COMPARISON:  CTA head and neck 12/25/2021.  Revascularization films. FINDINGS: Anterior circulation: The internal carotid arteries are within normal limits from high cervical segments through the ICA termini bilaterally. The A1 and M1 segments are normal. MCA bifurcations are within normal limits. MCA bifurcations are intact. The revascularized superior right M2 segments remain patent. ACA and MCA branch vessels are normal bilaterally. Posterior circulation: The vertebral arteries are codominant. AICA vessels are dominant. The basilar artery is within normal limits. Right posterior cerebral artery originates from basilar tip. Left posterior cerebral artery is of fetal type. A left P1 segment contributes. PCA branch vessels are within normal limits bilaterally. Anatomic variants: None Other: None. IMPRESSION: 1. Normal variant MRA Circle of Willis without evidence for significant proximal stenosis, aneurysm, or branch vessel occlusion. 2. The revascularized superior right M2 segments remain patent. Electronically Signed   By: Marin Robertshristopher  Mattern M.D.   On: 12/26/2021 16:54  ? ?MR BRAIN WO CONTRAST ? ?Result Date: 01/03/2022 ?CLINICAL DATA:  Stroke follow-up EXAM: MRI HEAD WITHOUT CONTRAST TECHNIQUE: Multiplanar, multiecho pulse sequences of the brain and surrounding structures were obtained without intravenous  contrast. COMPARISON:  12/26/2021 FINDINGS: Brain: Expected evolution of diffusion abnormalities in the right MCA and right PCA territories. Areas of diffusion restriction in the left frontal and parietal white matter of also undergone expected changes. Petechial hemorrhage in the left parietal lesion is slightly diminished. No midline shift or other mass effect. Normal white matter signal, parenchymal volume and CSF spaces. The midline structures are normal. Vascular: Major flow voids are preserved. Skull and upper cervical spine: Normal calvarium and skull base. Visualized upper cervical spine and soft tissues are normal. Sinuses/Orbits:Mastoid effusions. Paranasal sinuses are clear. Normal orbits. IMPRESSION: 1. Expected evolution of subacute ischemia in the right MCA territory, right occipital lobe and left frontal and parietal white matter. 2. Petechial blood in the left parietal lobe. Heidelberg classification 1a: HI1, scattered small petechiae, no mass effect. Electronically Signed   By: Deatra RobinsonKevin  Herman M.D.   On: 01/03/2022 03:40  ? ?MR BRAIN WO CONTRAST ? ?Result Date: 12/26/2021 ?CLINICAL DATA:  Status post revascularization of right superior M2 occlusion. EXAM: MRI HEAD WITHOUT  CONTRAST TECHNIQUE: Multiplanar, multiecho pulse sequences of the brain and surrounding structures were obtained without intravenous contrast. COMPARISON:  CTA head and neck, CT perfusion, and revascularization images 12/25/21 FINDINGS: Brain: Diffusion-weighted images demonstrate acute hemorrhage within the superior right MCA division involving the right insular cortex and frontal lobe. Diffuse cortical infarct involves the high right parietal cortex. Sparing of the more inferior parietal lobe and superior temporal lobe noted. Focal area of nonhemorrhagic infarct is present in the right occipital pole. Contralateral left acute parietal and posterior left frontal lobe cortical infarcts are present as well. T2 hyperintensities are  associated with the areas of acute/subacute infarction. Focal susceptibility involving the left parietal infarct noted. Additional areas of cortical hemorrhage are present in the high posterior right parietal lobe an

## 2022-01-11 DIAGNOSIS — I059 Rheumatic mitral valve disease, unspecified: Secondary | ICD-10-CM | POA: Diagnosis not present

## 2022-01-11 DIAGNOSIS — I63411 Cerebral infarction due to embolism of right middle cerebral artery: Secondary | ICD-10-CM

## 2022-01-11 DIAGNOSIS — Z930 Tracheostomy status: Secondary | ICD-10-CM

## 2022-01-11 LAB — CBC
HCT: 34.7 % — ABNORMAL LOW (ref 39.0–52.0)
Hemoglobin: 10.9 g/dL — ABNORMAL LOW (ref 13.0–17.0)
MCH: 29.2 pg (ref 26.0–34.0)
MCHC: 31.4 g/dL (ref 30.0–36.0)
MCV: 93 fL (ref 80.0–100.0)
Platelets: 442 10*3/uL — ABNORMAL HIGH (ref 150–400)
RBC: 3.73 MIL/uL — ABNORMAL LOW (ref 4.22–5.81)
RDW: 14.5 % (ref 11.5–15.5)
WBC: 9 10*3/uL (ref 4.0–10.5)
nRBC: 0 % (ref 0.0–0.2)

## 2022-01-11 LAB — BASIC METABOLIC PANEL
Anion gap: 9 (ref 5–15)
BUN: 39 mg/dL — ABNORMAL HIGH (ref 8–23)
CO2: 26 mmol/L (ref 22–32)
Calcium: 8.9 mg/dL (ref 8.9–10.3)
Chloride: 107 mmol/L (ref 98–111)
Creatinine, Ser: 1.21 mg/dL (ref 0.61–1.24)
GFR, Estimated: >60 mL/min (ref >60)
Glucose, Bld: 147 mg/dL — ABNORMAL HIGH (ref 70–99)
Potassium: 4.3 mmol/L (ref 3.5–5.1)
Sodium: 142 mmol/L (ref 135–145)

## 2022-01-11 LAB — GLUCOSE, CAPILLARY
Glucose-Capillary: 163 mg/dL — ABNORMAL HIGH (ref 70–99)
Glucose-Capillary: 140 mg/dL — ABNORMAL HIGH (ref 70–99)
Glucose-Capillary: 135 mg/dL — ABNORMAL HIGH (ref 70–99)
Glucose-Capillary: 123 mg/dL — ABNORMAL HIGH (ref 70–99)
Glucose-Capillary: 125 mg/dL — ABNORMAL HIGH (ref 70–99)
Glucose-Capillary: 168 mg/dL — ABNORMAL HIGH (ref 70–99)

## 2022-01-11 LAB — CULTURE, RESPIRATORY W GRAM STAIN: Culture: NORMAL

## 2022-01-11 MED ORDER — QUETIAPINE FUMARATE 25 MG PO TABS
12.5000 mg | ORAL_TABLET | Freq: Every day | ORAL | Status: DC
  Administered 2022-01-11 – 2022-01-14 (×4): 12.5 mg via NASOGASTRIC
  Filled 2022-01-11 (×4): qty 1

## 2022-01-11 MED ORDER — NUTRISOURCE FIBER PO PACK
1.0000 | PACK | Freq: Two times a day (BID) | ORAL | Status: DC
  Administered 2022-01-11 – 2022-01-15 (×9): 1
  Filled 2022-01-11 (×10): qty 1

## 2022-01-11 MED ORDER — LISINOPRIL 10 MG PO TABS
10.0000 mg | ORAL_TABLET | Freq: Every day | ORAL | Status: DC
  Administered 2022-01-12 – 2022-01-15 (×4): 10 mg
  Filled 2022-01-11 (×4): qty 1

## 2022-01-11 MED ORDER — HYDROCODONE-ACETAMINOPHEN 5-325 MG PO TABS
1.0000 | ORAL_TABLET | Freq: Four times a day (QID) | ORAL | Status: DC | PRN
  Administered 2022-01-12: 2
  Filled 2022-01-11: qty 2

## 2022-01-11 MED ORDER — GUAIFENESIN 100 MG/5ML PO LIQD
15.0000 mL | Freq: Four times a day (QID) | ORAL | Status: DC
  Filled 2022-01-11: qty 15

## 2022-01-11 MED ORDER — FREE WATER
200.0000 mL | Freq: Three times a day (TID) | Status: DC
  Administered 2022-01-11 – 2022-01-15 (×12): 200 mL

## 2022-01-11 MED ORDER — GUAIFENESIN 100 MG/5ML PO LIQD
15.0000 mL | Freq: Four times a day (QID) | ORAL | Status: DC
  Administered 2022-01-11 – 2022-01-15 (×14): 15 mL
  Filled 2022-01-11 (×14): qty 15

## 2022-01-11 MED ORDER — LABETALOL HCL 5 MG/ML IV SOLN: 10.0000 mg | INTRAVENOUS | Status: AC | PRN

## 2022-01-11 NOTE — Progress Notes (Signed)
eLink Physician-Brief Progress Note ?Patient Name: Wayland Baik ?DOB: [birth year] ?MRN: [id number] ? ? ?Date of Service ? 01/11/2022  ?HPI/Events of Note ? Patient with uncontrolled hypertension.  ?eICU Interventions ? PRN Labetalol ordered.  ? ? ? ?  ? ?Migdalia Dk ?01/11/2022, 2:58 AM ?

## 2022-01-11 NOTE — Progress Notes (Signed)
Disability/work paperwork to be filled out by MD/social work placed in chart to be filled out before 6/10. ?

## 2022-01-11 NOTE — Plan of Care (Signed)
?  Problem: Education: ?Goal: Knowledge of disease or condition will improve ?Outcome: Progressing ?Goal: Knowledge of secondary prevention will improve (SELECT ALL) ?Outcome: Progressing ?  ?Problem: Health Behavior/Discharge Planning: ?Goal: Ability to manage health-related needs will improve ?Outcome: Progressing ?  ?Problem: Self-Care: ?Goal: Ability to participate in self-care as condition permits will improve ?Outcome: Progressing ?  ?Problem: Nutrition: ?Goal: Risk of aspiration will decrease ?Outcome: Progressing ?  ?Problem: Education: ?Goal: Knowledge of General Education information will improve ?Description: Including pain rating scale, medication(s)/side effects and non-pharmacologic comfort measures ?Outcome: Progressing ?  ?Problem: Health Behavior/Discharge Planning: ?Goal: Ability to manage health-related needs will improve ?Outcome: Progressing ?  ?Problem: Clinical Measurements: ?Goal: Ability to maintain clinical measurements within normal limits will improve ?Outcome: Progressing ?Goal: Will remain free from infection ?Outcome: Progressing ?Goal: Diagnostic test results will improve ?Outcome: Progressing ?Goal: Respiratory complications will improve ?Outcome: Progressing ?Goal: Cardiovascular complication will be avoided ?Outcome: Progressing ?  ?Problem: Activity: ?Goal: Risk for activity intolerance will decrease ?Outcome: Progressing ?  ?Problem: Nutrition: ?Goal: Adequate nutrition will be maintained ?Outcome: Progressing ?  ?Problem: Coping: ?Goal: Level of anxiety will decrease ?Outcome: Progressing ?  ?Problem: Elimination: ?Goal: Will not experience complications related to bowel motility ?Outcome: Progressing ?Goal: Will not experience complications related to urinary retention ?Outcome: Progressing ?  ?Problem: Pain Managment: ?Goal: General experience of comfort will improve ?Outcome: Progressing ?  ?Problem: Safety: ?Goal: Ability to remain free from injury will improve ?Outcome:  Progressing ?  ?Problem: Skin Integrity: ?Goal: Risk for impaired skin integrity will decrease ?Outcome: Progressing ?  ?Problem: Activity: ?Goal: Ability to tolerate increased activity will improve ?Outcome: Progressing ?  ?Problem: Respiratory: ?Goal: Ability to maintain a clear airway and adequate ventilation will improve ?Outcome: Progressing ?  ?Problem: Role Relationship: ?Goal: Method of communication will improve ?Outcome: Progressing ?  ?Problem: Education: ?Goal: Knowledge about tracheostomy care/management will improve ?Outcome: Progressing ?  ?Problem: Activity: ?Goal: Ability to tolerate increased activity will improve ?Outcome: Progressing ?  ?Problem: Health Behavior/Discharge Planning: ?Goal: Ability to manage tracheostomy will improve ?Outcome: Progressing ?  ?Problem: Respiratory: ?Goal: Patent airway maintenance will improve ?Outcome: Progressing ?  ?Problem: Role Relationship: ?Goal: Ability to communicate will improve ?Outcome: Progressing ?  ?

## 2022-01-11 NOTE — Progress Notes (Signed)
?   01/11/22 1737  ?Assess: MEWS Score  ?Temp 99.6 ?F (37.6 ?C)  ?BP (!) 154/103  ?Pulse Rate (!) 118  ?Resp (!) 24  ?Level of Consciousness Alert  ?SpO2 97 %  ?O2 Device Tracheostomy Collar  ?O2 Flow Rate (L/min) 8 L/min  ?FiO2 (%) 35 %  ?Assess: MEWS Score  ?MEWS Temp 0  ?MEWS Systolic 0  ?MEWS Pulse 2  ?MEWS RR 1  ?MEWS LOC 0  ?MEWS Score 3  ?MEWS Score Color Yellow  ?Assess: if the MEWS score is Yellow or Red  ?Were vital signs taken at a resting state? Yes  ?Focused Assessment No change from prior assessment  ?Early Detection of Sepsis Score *See Row Information* Low  ?MEWS guidelines implemented *See Row Information* Yes  ?Treat  ?MEWS Interventions Administered scheduled meds/treatments  ?Pain Scale Faces  ?Faces Pain Scale 0  ?Neuro symptoms relieved by Rest  ?Take Vital Signs  ?Increase Vital Sign Frequency  Yellow: Q 2hr X 2 then Q 4hr X 2, if remains yellow, continue Q 4hrs  ?Escalate  ?MEWS: Escalate Yellow: discuss with charge nurse/RN and consider discussing with provider and RRT  ?Notify: Charge Nurse/RN  ?Name of Charge Nurse/RN Notified French Ana  ?Date Charge Nurse/RN Notified 01/11/22  ?Time Charge Nurse/RN Notified 1745  ?Document  ?Patient Outcome Stabilized after interventions  ?Progress note created (see row info) Yes  ? ? ?

## 2022-01-11 NOTE — Progress Notes (Addendum)
Physical Therapy Treatment ?Patient Details ?Name: Vincent Ferrell ?MRN: [id number] ?DOB: Jul 20, 1961 ?Today's Date: 01/11/2022 ? ? ?History of Present Illness Pt is 61 yo male who presents with L side weakness, L facial droop, and R gaze deviation. He had been dx with bronchopneumonia on 4/11. 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 Pt required intubation on 4/14. Pt with lower GIB, colonoscopy 4/17 and upper GI endoscopy 4/18. Pt underwent Transurethral resection of the prostatic abscess on 4/18. PMH: HLD, HTN, elevated glucose.  Had desats and ETT advanced on 4/20, then underwent trach and bronch on 4/24, and PEG placed 4/25. ? ?  ?PT Comments  ? ? Goal of session was to progress to sitting edge of bed. Pt able to follow some R sided commands and visually track to midline (but not sustain). Nodding "yes," intermittently to questions. Utilized music for attention. Pt requiring two person total assist for bed mobility. Worked on static sitting, truncal rotation, and RUE functional reaching. SpO2 > 90% on ATC, HR 105-128 bpm. Will continue to progress as tolerated. ?   ?Recommendations for follow up therapy are one component of a multi-disciplinary discharge planning process, led by the attending physician.  Recommendations may be updated based on patient status, additional functional criteria and insurance authorization. ? ?Follow Up Recommendations ? PT at Long-term acute care hospital ?  ?  ?Assistance Recommended at Discharge Frequent or constant Supervision/Assistance  ?Patient can return home with the following Two people to help with walking and/or transfers;Assistance with cooking/housework;Two people to help with bathing/dressing/bathroom;Assist for transportation;Direct supervision/assist for financial management ?  ?Equipment Recommendations ? Hospital bed;Other (comment) (hoyer lift)  ?  ?Recommendations for Other Services   ? ? ?  ?Precautions / Restrictions Precautions ?Precautions:  Fall ?Precaution Comments: L hemiplegia, trach and PEG, watch HR ?Restrictions ?Weight Bearing Restrictions: No  ?  ? ?Mobility ? Bed Mobility ?Overal bed mobility: Needs Assistance ?Bed Mobility: Rolling, Supine to Sit, Sit to Supine ?Rolling: Max assist ?  ?Supine to sit: Total assist, +2 for physical assistance ?Sit to supine: Total assist, +2 for physical assistance ?  ?General bed mobility comments: Cues for bending R knee and reaching RUE for rolling to L. TotalA + 2 for supine <> sit towards R side of bed ?  ? ?Transfers ?  ?  ?  ?  ?  ?  ?  ?  ?  ?General transfer comment: unable ?  ? ?Ambulation/Gait ?  ?  ?  ?  ?  ?  ?  ?  ? ? ?Stairs ?  ?  ?  ?  ?  ? ? ?Wheelchair Mobility ?  ? ?Modified Rankin (Stroke Patients Only) ?Modified Rankin (Stroke Patients Only) ?Pre-Morbid Rankin Score: No symptoms ?Modified Rankin: Severe disability ? ? ?  ?Balance Overall balance assessment: Needs assistance ?Sitting-balance support: No upper extremity supported, Feet supported, Single extremity supported ?Sitting balance-Leahy Scale: Zero ?Sitting balance - Comments: MaxA for static sitting balance ?  ?  ?  ?  ?  ?  ?  ?  ?  ?  ?  ?  ?  ?  ?  ?  ? ?  ?Cognition Arousal/Alertness: Awake/alert ?Behavior During Therapy: Flat affect ?Overall Cognitive Status: Difficult to assess ?  ?  ?  ?  ?  ?  ?  ?  ?  ?  ?  ?  ?  ?  ?  ?  ?General Comments: Pt alert with R gaze preference,  follows some R sided commands i.e. thumbs up, knee extension. also will track to midline but not sustain ?  ?  ? ?  ?Exercises General Exercises - Lower Extremity ?Long Arc Quad: AROM, Both, 5 reps, Seated ?Other Exercises ?Other Exercises: Sitting: truncal rotation to R/L with facilitation ?Other Exercises: Sitting: reaching with RUE for object ?Other Exercises: Sitting: visual tracking to midline ? ?  ?General Comments   ?  ?  ? ?Pertinent Vitals/Pain Pain Assessment ?Pain Assessment: Faces ?Faces Pain Scale: Hurts little more ?Pain Location: neck with  head rotation L ?Pain Descriptors / Indicators: Grimacing ?Pain Intervention(s): Monitored during session  ? ? ?Home Living   ?  ?  ?  ?  ?  ?  ?  ?  ?  ?   ?  ?Prior Function    ?  ?  ?   ? ?PT Goals (current goals can now be found in the care plan section) Acute Rehab PT Goals ?Patient Stated Goal: per sister for rehab ?Potential to Achieve Goals: Fair ? ?  ?Frequency ? ? ? Min 3X/week ? ? ? ?  ?PT Plan Discharge plan needs to be updated  ? ? ?Co-evaluation PT/OT/SLP Co-Evaluation/Treatment: Yes ?Reason for Co-Treatment: Complexity of the patient's impairments (multi-system involvement);Necessary to address cognition/behavior during functional activity;For patient/therapist safety;To address functional/ADL transfers ?PT goals addressed during session: Mobility/safety with mobility ?  ?  ? ?  ?AM-PAC PT "6 Clicks" Mobility   ?Outcome Measure ? Help needed turning from your back to your side while in a flat bed without using bedrails?: Total ?Help needed moving from lying on your back to sitting on the side of a flat bed without using bedrails?: Total ?Help needed moving to and from a bed to a chair (including a wheelchair)?: Total ?Help needed standing up from a chair using your arms (e.g., wheelchair or bedside chair)?: Total ?Help needed to walk in hospital room?: Total ?Help needed climbing 3-5 steps with a railing? : Total ?6 Click Score: 6 ? ?  ?End of Session Equipment Utilized During Treatment: Oxygen ?Activity Tolerance: Patient tolerated treatment well ?Patient left: in bed;with call bell/phone within reach;with bed alarm set;with SCD's reapplied ?Nurse Communication: Mobility status;Need for lift equipment ?PT Visit Diagnosis: Other abnormalities of gait and mobility (R26.89);Muscle weakness (generalized) (M62.81);Other symptoms and signs involving the nervous system (R29.898);Hemiplegia and hemiparesis ?Hemiplegia - Right/Left: Left ?Hemiplegia - dominant/non-dominant: Non-dominant ?  ? ? ?Time:  7353-2992 ?PT Time Calculation (min) (ACUTE ONLY): 29 min ? ?Charges:  $Therapeutic Activity: 8-22 mins          ?          ? ?Lillia Pauls, PT, DPT ?Acute Rehabilitation Services ?Pager [phone] ?Office [phone] ? ? ? ?Vincent Ferrell ?01/11/2022, 3:06 PM ? ?

## 2022-01-11 NOTE — Progress Notes (Signed)
? ?NAME:  Vincent Ferrell, MRN:  [id number], DOB:  December 28, 1960, LOS: 17 ?ADMISSION DATE:  12/25/2021, CONSULTATION DATE:  12/25/2021 ?REFERRING MD:  Dr. Corliss Skains, CHIEF COMPLAINT:  left sided weakness  ? ?History of Present Illness:  ?HPI mostly obtained from medical chart review.  Attempted from patient but limited given aphasia.  ? ?61 year old male with prior history of HTN, HLD, and GERD who presented to ER as code stroke.  Patient was at the vet's office when he acutely developed left sided weakness and right gaze deviation at 0945.  Code stroke activated by EMS in addition to code STEMI based on diffuse STE.  Of note, patient recently diagnosed with RLL bronchopneumonia two days ago by his PCP.   ? ?He was evaluated by Neurology and Cardiology in ER, HR in 150s with no chest pain or dyspnea, with EKG not meeting criteria for STEMI but showed mild STE, felt possibly rate related versus possible pericarditis.  Echo and cardiac workup ordered.  CT head showed concern for subacute infarcts in the R PCA territory and two small areas possibly representing calcification or blood, therefore thrombolytics deferred.  CTA head/ neck and perfusion study showed acute right MCA anterior M2 occlusion.   ?Labs significant for WBC 20.6, Hgb 9.2, Hct 26.8, plts 205, normal coags, K 4.9, bicarb 21, glucose 138, BUN 34, sCr 1.83, iCa 1.03, low albumin/ protein, lactic acid 1.5. Trop hs pending.  CXR showing elevation of right hemidiaphragm with pulmonary vascular congestion.  Blood cultures sent.   Temp 100 in ER, remains ST, and initial BP 140/90, and remains on room air > 94%.  He was taken emergently to Neuro IR for mechanical thrombectomy with complete revascularization achieving TICI 3 revascularization.  Patient was extubated post intervention.  Briefly required phenylephrine in PACU.  Received 2L LR and albumin in procedure.  PCCM consulted for further medical assistance given concern for sepsis and possible cardioembolic  event related to stroke. ? ?Patient is currently only able to nod yes/ no to question given expressive aphasia and mostly incomprehensible speech.  Nodded yes to recent fever, cough with productive sputum, and headache.  Nods yes to recent bloody stools.  Did indicate that he started antibiotics from PCP.  Denied current or previous SOB, CP, palpitations, N/V/abd pain, lower leg swelling.   Patient indicates he lives alone, not married, no children, and has living parents out of state.  Non smoker, occasional but not daily ETOH, denies drug abuse.  ? ?Pertinent  Medical History  ?HLT, HLD, GERD, rising PSA level, impaired fasting glucose ? 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  Cards consulted for initial code STEMI with diffuse STE, no STEMI. Required 4 units of pRBCs for lower GI bleed.  ?4/17: Large bloody BM. Bedside colonoscopy with blood throughout colon.  TEE with 1 cm x 0.5 cm vegetation involving both atrial and ventricular sides. Hemoglobin decreased to 6.8. Additional 2 units of pRBCs and FFP given.  ?4/18: Bedside EGD and colonoscopy with evidence of black blood but no source of bleeding identified. MRI of the prostate demonstrated large abscess. Patient taken for drainage by Urology. Cultures obtained.  ?4/19: Propofol switched to Precedex in anticipation of extubation.  ?4/20: Increased agitation with worsening oxygen requirements overnight. Required switch back to Propofol. CXR with atelectasis +/- hypervolemia. Lasix given. TCTS consulted for concern of valvular  insufficiency. Repeat echo with increased peri-cardial effusion size.   ?4/22 no significant issues overnight, remains on propofol infusion due to agitation and anxiety  ?4/23 No major issues overnight, MRI brian with expected stroke evolution. On assessment this  right knee is red and warm to touch  ?4/24 trach ?4/25 PEG ?4/26 stopping heavy sedation looking good on PSV/ failed ATC attempt ?4/27 getting IV lasix; tolerated several hours of ATC ?4/29 back on ATC. Family at bedside  ? ?Interim History / Subjective:  ?On ATC for 48hrs now ?Tmax 99.4 ? ?Objective   ?Blood pressure (!) 143/99, pulse (!) 114, temperature 99.9 ?F (37.7 ?C), temperature source Axillary, resp. rate 20, height 5' 11.5" (1.816 m), weight 92.1 kg, SpO2 97 %. ?   ?FiO2 (%):  [40 %] 40 %  ? ?Intake/Output Summary (Last 24 hours) at 01/11/2022 1029 ?Last data filed at 01/11/2022 0700 ?Gross per 24 hour  ?Intake 4019.22 ml  ?Output 3650 ml  ?Net 369.22 ml  ? ?Filed Weights  ? 12/25/21 1000 12/27/21 0500 01/03/22 0400  ?Weight: 100.8 kg 100.6 kg 92.1 kg  ? ?Examination:  ?General:  older male lying in bed in NAD ?HEENT: MM pink/dry, midline sutured trach cuff deflated, pupils 5 reactive,  ?Neuro: eye open, will f/c on right, left flaccid ?CV: rr, ST, +2 pulses ?PULM:  non labored on ATC, diffuse rhonchi but strong cough, has not required much suctioning, pale yellow secretions ?GI: soft, bs+, NT, peg tube site wnl, foley, FMS in place ?Extremities: warm/dry, no LE edema  ?Skin: no rashes ? ?UOP 3L/ 24hrs ?Unmeasured stool but over 1L out/ 24hrs ? ?Labs> reviewed ? ?Resolved Hospital Problem list   ?Septic Shock, resolved  ?Ileus/partial small bowel obstruction ?Pneumonia  ?Gastritis 2/2 OG tube  ?Right knee erythremia  ? ?Assessment & Plan:  ?Pericardial effusion ?Moderate without tamponade on 4/20. Resolved on TTE from 4/25 ? ?Prostate abscess ?Prostatic abscess found on MRI - drained. Growing S.aureus MSSA.  ?- Treat 6 weeks with nafcillin.  ?- clarified with urology, Dr. Alvester MorinBell 5/1.  Continue foley till 5/15 and reassess for voiding trial.  ? ?Acute respiratory failure with hypoxia (HCC) ?- on ATC now for 48hrs.   ?- great cough clearance.  Trach asp 4/29 pending  ?-- sutures out today on trach ?- stable to  transfer to PCU ?- adding mucinex to thin secretions ?- PCCM will follow weekly for trach care ? 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 2 vegetations 1x0.5cm ?Likely cause of stroke ?- 6 weeks of antibiotics/ nafcillin, stop date 5/25 ?- ID following  ?- given minimal MR, recent stroke - will treat medically.  ? ?Acute ischemic right MCA stroke (HCC) ?Left sided weakness and right gaze deviation .  ?Improving and following some commands with sedation off. ?- Stroke rehabilitation ?- No anticoagulation at this time given active endocarditis.  ?- chemical DVT prophylaxis started 4/30 ? ?Acute kidney injury (HCC) ?- resolved.  Follow renal indices/ UOP ? ?Anemia due to GI blood loss ?- No signs of bleeding.  H/H stable.  Monitor  ? ?HTN ?- SBP higher today, increase lisinopril 5> 10mg  daily ? ?Diarrhea ?- FMS in place 4/30, 1L out / 24hrs ?- adding fiber ?- prn bowel regimen ? ?Best Practice (right click and "Reselect all SmartList Selections" daily)  ? ?Diet/type: tubefeeds via PEG  ?DVT prophylaxis: SCDs ?GI prophylaxis: PPI ?Lines: N/A ?Foley:  Yes. Voiding trials can start on 5/15?  ?Code Status:  full code ?Last date of multidisciplinary goals of care discussion: 5/1 Sister and girlfriend updated at bedside ? ? ? ? ?Posey Boyer, ACNP ?[facility] ?01/11/2022, 10:30 AM ? ?See Amion for pager ?If no response to pager, please call PCCM consult pager ?After 7:00 pm call Elink   ? ? ?

## 2022-01-11 NOTE — Progress Notes (Signed)
? ?[facility] Infectious Diseases Follow Up Note ? ?Patient Identification: ?Patient Name: Vincent Ferrell MRN: [id number] [place] Date: 12/25/2021 10:23 AM ?Age: 61 y.o.Today's Date: 01/11/2022 ? ? ?Reason for Visit: Follow up on endocarditis/fevers ? ?Principal Problem: ?  Endocarditis of mitral valve ?Active Problems: ?  Acute ischemic right MCA stroke ([place]) ?  Sinus tachycardia ?  Acute respiratory failure with hypoxia ([place]) ?  Prostate abscess ?  Acute kidney injury ([place]) ?  Anemia due to GI blood loss ?  Pericardial effusion ?  Encephalopathy acute ?  Severe protein-calorie malnutrition ([place]) ?  Physical deconditioning ?  Diarrhea ? ? ?Antibiotics:  ?Nafcillin 4/21-c ?Amp sulbactam 4/15-4/17, ceftriaxone 4/17-4/20 ?Vancomycin 4/14, 4/15, 4/17-4/20 ?Cefazolin/ceftriaxone/azithromycin 4/14 ? ?Lines/Hardwares:  ? ?Interval Events: One episode of low grade tempr in the last 72 hrs.  ? 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 ?Acute RT ischemic CVA s/p mechanical thrombectomy with complete revascularization  ? ?Acute hypoxic respiratory failure secondary to multifocal pneumonia and #1 - s/p Trach 4/24 and PEG 4/25. Plan for trach change 5/2 ? 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 ?AKI - improving  ? ?Acute encephalopathy( improving) - multifactorial, and follows some commands per RN ? ?Fevers - low grade, low suspicion for new infection. Monitor for now ? ?Recommendations ?Continue nafcillin infusion, plan for 6 weeks total. EOT 02/12/22 ?Monitor worsening of cr while on Nafcillin ?Monitor fever and improvement in mental status  ?Plan for transfer out of ICU noted  ?Following  intermittently, please call with questions  ? ?Rest of the management as per the primary team. ?Thank you for the consult. Please page with pertinent questions or concerns. ? ?______________________________________________________________________ ?Subjective ?patient seen and examined at the bedside. ?He follows commands intermittently  ? ?Vitals ?BP (!) 152/102   Pulse (!) 114   Temp 99.4 ?F (37.4 ?C) (Axillary)   Resp 20   Ht 5' 11.5" (1.816 m)   Wt 92.1 kg   SpO2 97%   BMI 27.92 kg/m?  ? ?  ?Physical Exam ?Constitutional: Lying in bed, dry oral mucosa  ?   Comments: ? ?Cardiovascular:  ?   Rate and Rhythm: Normal rate and regular rhythm.  ?   Heart sounds:  ? ?Pulmonary:  ?   Effort: Pulmonary effort is normal on open trach collar Fi02 40% ?   Comments: Coarse breath sounds bilaterally ? ?Abdominal:  ?   Palpations: Abdomen is soft.  ?   Tenderness: PEG+ ? ?Musculoskeletal:     ?   General: No swelling in peripheral joints.  ? ?Skin: ?   Comments:  ? ?Neurological:  ?   General: unable to assess  ? ?Pertinent Microbiology ?Results for orders placed or performed during the hospital encounter of 12/25/21  ?Resp Panel by RT-PCR (Flu A&B, Covid) Nasopharyngeal Swab     Status: None  ? Collection Time: 12/25/21 11:14 AM  ? Specimen: Nasopharyngeal Swab; Nasopharyngeal(NP) swabs in vial transport medium  ?Result Value Ref Range Status  ? SARS Coronavirus 2 by RT PCR NEGATIVE NEGATIVE Final  ?  Comment: (NOTE) ?SARS-CoV-2 target nucleic acids are NOT DETECTED. ? ?The SARS-CoV-2 RNA is generally detectable in upper respiratory ?specimens during the acute phase of infection. The lowest ?concentration of SARS-CoV-2 viral copies this assay can detect  is ?138 copies/mL. A negative result does not preclude SARS-Cov-2 ?infection and should not be used as the sole basis for treatment or ?other patient management decisions. A negative result may occur with  ?improper specimen collection/handling, submission of specimen  other ?than nasopharyngeal swab, presence of viral mutation(s) within the ?areas targeted by this assay, and inadequate number of viral ?copies(<138 copies/mL). A negative result must be combined with ?clinical observations, patient history, and epidemiological ?information. The expected result is Negative. ? ?Fact Sheet for Patients:  ?EntrepreneurPulse.com.au ? ?Fact Sheet for Healthcare Providers:  ?IncredibleEmployment.be ? ?This test is no t yet approved or cleared by the [place] FDA and  ?has been authorized for detection and/or diagnosis of SARS-CoV-2 by ?FDA under an Emergency Use Authorization (EUA). This EUA will remain  ?in effect (meaning this test can be used) for the duration of the ?COVID-19 declaration under Section 564(b)(1) of the Act, 21 ?U.S.C.section 360bbb-3(b)(1), unless the authorization is terminated  ?or revoked sooner.  ? ? ?  ? Influenza A by PCR NEGATIVE NEGATIVE Final  ? Influenza B by PCR NEGATIVE NEGATIVE Final  ?  Comment: (NOTE) ?The Xpert Xpress SARS-CoV-2/FLU/RSV plus assay is intended as an aid ?in the diagnosis of influenza from Nasopharyngeal swab specimens and ?should not be used as a sole basis for treatment. Nasal washings and ?aspirates are unacceptable for Xpert Xpress SARS-CoV-2/FLU/RSV ?testing. ? ?Fact Sheet for Patients: ?EntrepreneurPulse.com.au ? ?Fact Sheet for Healthcare Providers: ?IncredibleEmployment.be ? ?This test is not yet approved or cleared by the [place] FDA and ?has been authorized for detection and/or diagnosis of SARS-CoV-2 by ?FDA under an Emergency Use Authorization (EUA). This EUA will remain ?in effect (meaning this test can be used) for the duration of the ?COVID-19 declaration under Section 564(b)(1) of the Act, 21 U.S.C. ?section 360bbb-3(b)(1), unless the authorization is terminated or ?revoked. ? ?Performed at [facility], [place] [address] [place],  [place] ?[address] ?  ?Culture, blood (Routine X 2) w Reflex to ID Panel     Status: None  ? Collection Time: 12/25/21  1:15 PM  ? Specimen: BLOOD RIGHT HAND  ?Result Value Ref Range Status  ? Specimen Description BLOOD RIGHT HAND  Final  ? Special Requests   Final  ?  BOTTLES DRAWN AEROBIC AND ANAEROBIC Blood Culture adequate volume  ? Culture   Final  ?  NO GROWTH 5 DAYS ?Performed at [facility], [place] [address] [place], [place] [address] ?  ? Report Status 12/30/2021 FINAL  Final  ?Culture, blood (Routine X 2) w Reflex to ID Panel     Status: None  ? Collection Time: 12/25/21  1:26 PM  ? Specimen: BLOOD LEFT HAND  ?Result Value Ref Range Status  ? Specimen Description BLOOD LEFT HAND  Final  ? Special Requests   Final  ?  BOTTLES DRAWN AEROBIC AND ANAEROBIC Blood Culture results may not be optimal due to an inadequate volume of blood received in culture bottles  ? Culture   Final  ?  NO GROWTH 5 DAYS ?Performed at [facility], [place] [address] [place], [place] [address] ?  ? Report Status 12/30/2021 FINAL  Final  ?MRSA Next Gen by PCR, Nasal     Status: None  ? Collection Time: 12/25/21  3:52 PM  ? Specimen: Nasal Mucosa; Nasal Swab  ?Result Value Ref Range Status  ? MRSA by PCR Next Gen NOT DETECTED NOT DETECTED Final  ?  Comment: (NOTE) ?  The GeneXpert MRSA Assay (FDA approved for NASAL specimens only), ?is one component of a comprehensive MRSA colonization surveillance ?program. It is not intended to diagnose MRSA infection nor to guide ?or monitor treatment for MRSA infections. ?Test performance is not FDA approved in patients less than 2 years ?old. ?Performed at [facility], [place] [address], [place], [place] ?[address] ?  ?Culture, blood (Routine X 2) w Reflex to ID Panel     Status: None  ? Collection Time: 12/28/21  4:35 PM  ? Specimen: BLOOD LEFT HAND  ?Result Value Ref Range Status  ? Specimen Description BLOOD LEFT HAND  Final  ? Special Requests   Final  ?  BOTTLES DRAWN AEROBIC AND  ANAEROBIC Blood Culture adequate volume  ? Culture   Final  ?  NO GROWTH 5 DAYS ?Performed at [facility], [place] [address] [place],  [address] ?  ? Report Status 01/02/2022 FINAL  Final  ?Culture, blood

## 2022-01-11 NOTE — Plan of Care (Signed)
?  Problem: Education: ?Goal: Knowledge of disease or condition will improve ?Outcome: Progressing ?  ?Problem: Health Behavior/Discharge Planning: ?Goal: Ability to manage health-related needs will improve ?Outcome: Progressing ?  ?Problem: Self-Care: ?Goal: Ability to participate in self-care as condition permits will improve ?Outcome: Progressing ?  ?Problem: Nutrition: ?Goal: Risk of aspiration will decrease ?Outcome: Progressing ?  ?

## 2022-01-11 NOTE — Progress Notes (Signed)
Occupational Therapy Treatment ?Patient Details ?Name: Vincent Ferrell ?MRN: [id number] ?DOB: 11/07/1960 ?Today's Date: 01/11/2022 ? ? ?History of present illness Pt is 61 yo male who presents with L side weakness, L facial droop, and R gaze deviation. He had been dx with bronchopneumonia on 4/11. 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 Pt required intubation on 4/14. Pt with lower GIB, colonoscopy 4/17 and upper GI endoscopy 4/18. Pt underwent Transurethral resection of the prostatic abscess on 4/18. PMH: HLD, HTN, elevated glucose.  Had desats and ETT advanced on 4/20, then underwent trach and bronch on 4/24, and PEG placed 4/25. ?  ?OT comments ? Kasim is incrementally progressing, able to tolerate sitting Eob this session with VSS, SpO2 > 90% on ATC, HR 105-128 bpm. LUE PROM complete and pt noted with increased edema throughout and sublux L shoulder; pt left with LUE supported and elevated at the end of the session. Overall he continues to required max-total A+2 for mobility and ADLs. Pt answered yes/no questions appropriately the majority of the session and attended briefly to the R with mod cues. Worked on truncal rotation and righting reactions at the EOB as a precursor to Adls. Pt continues to benefit from OT acutely. D/c remains appropriate.   ? ?Recommendations for follow up therapy are one component of a multi-disciplinary discharge planning process, led by the attending physician.  Recommendations may be updated based on patient status, additional functional criteria and insurance authorization. ?   ?Follow Up Recommendations ? Skilled nursing-short term rehab (<3 hours/day)  ?  ?Assistance Recommended at Discharge Frequent or constant Supervision/Assistance  ?Patient can return home with the following ? A little help with walking and/or transfers;Two people to help with walking and/or transfers;Two people to help with bathing/dressing/bathroom;Assistance with feeding;Assistance with  cooking/housework;Direct supervision/assist for medications management;Direct supervision/assist for financial management;Assist for transportation;Help with stairs or ramp for entrance ?  ?Equipment Recommendations ? Wheelchair (measurements OT);Wheelchair cushion (measurements OT);Hospital bed  ?  ?Recommendations for Other Services   ? ?  ?Precautions / Restrictions Precautions ?Precautions: Fall ?Precaution Comments: L hemiplegia, trach and PEG, watch HR ?Restrictions ?Weight Bearing Restrictions: No  ? ? ?  ? ?Mobility Bed Mobility ?Overal bed mobility: Needs Assistance ?Bed Mobility: Rolling, Supine to Sit, Sit to Supine ?Rolling: Max assist ?  ?Supine to sit: Total assist, +2 for physical assistance ?Sit to supine: Total assist, +2 for physical assistance ?  ?General bed mobility comments: Cues for bending R knee and reaching RUE for rolling to L. TotalA + 2 for supine <> sit towards R side of bed ?  ? ?Transfers ?  ?  ?  ?  ?  ?  ?  ?  ?  ?General transfer comment: unable ?  ?  ?Balance Overall balance assessment: Needs assistance ?Sitting-balance support: No upper extremity supported, Feet supported, Single extremity supported ?Sitting balance-Leahy Scale: Zero ?Sitting balance - Comments: MaxA for static sitting balance ?  ?  ?  ?  ?  ?  ?  ?  ?  ?  ?  ?  ?  ?  ?  ?   ? ?ADL either performed or assessed with clinical judgement  ? ?ADL Overall ADL's : Needs assistance/impaired ?  ?  ?  ?  ?  ?  ?  ?  ?  ?  ?  ?  ?  ?  ?  ?  ?  ?  ?  ?General ADL Comments: Dependent for all self  care.  Tube feeding. max A for sitting EOB balance ?  ? ?Extremity/Trunk Assessment Upper Extremity Assessment ?Upper Extremity Assessment: RUE deficits/detail;LUE deficits/detail ?RUE Deficits / Details: minimal R elbow and hand squeeze noted.  Unable to participate with MMT.  Unable to bring hand to mouth.  No spontaneous movements noted. ?LUE Deficits / Details: Flaccid.  Did respond to pain with nail bed squeeze. increased edema  & sublux ?LUE: Subluxation noted ?LUE Sensation: decreased proprioception;decreased light touch ?LUE Coordination: decreased fine motor;decreased gross motor ?  ?Lower Extremity Assessment ?Lower Extremity Assessment: Defer to PT evaluation ?  ?  ?  ? ?Vision   ?Vision Assessment?: Yes ?Alignment/Gaze Preference: Gaze right ?Tracking/Visual Pursuits: Unable to hold eye position out of midline ?Convergence: Impaired (comment) ?  ?Perception Perception ?Perception: Impaired ?  ?Praxis Praxis ?Praxis: Not tested ?  ? ?Cognition Arousal/Alertness: Awake/alert ?Behavior During Therapy: Flat affect ?Overall Cognitive Status: Difficult to assess ?  ?  ?  ?  ?  ?  ?  ?  ?  ?  ?  ?  ?  ?  ?  ?  ?General Comments: Pt alert with R gaze preference, follows some R sided commands i.e. thumbs up, knee extension. also will track to midline but not sustain ?  ?  ?   ?Exercises General Exercises - Upper Extremity ?Elbow Flexion: AAROM, PROM, Both, Supine ?Elbow Extension: AAROM, PROM, Both, Supine ?Other Exercises ?Other Exercises: Sitting: truncal rotation to R/L with facilitation ?Other Exercises: Sitting: reaching with RUE for object ?Other Exercises: Sitting: visual tracking to midline ? ?  ?Shoulder Instructions   ? ? ?  ?General Comments    ? ? ?Pertinent Vitals/ Pain       Pain Assessment ?Pain Assessment: Faces ?Faces Pain Scale: Hurts little more ?Pain Location: neck with head rotation L ?Pain Descriptors / Indicators: Grimacing ?Pain Intervention(s): Limited activity within patient's tolerance, Monitored during session ? ?Home Living   ?  ?  ?  ?  ?  ?  ?  ?  ?  ?  ?  ?  ?  ?  ?  ?  ?  ?  ? ?  ?Prior Functioning/Environment    ?  ?  ?  ?   ? ?Frequency ? Min 2X/week  ? ? ? ? ?  ?Progress Toward Goals ? ?OT Goals(current goals can now be found in the care plan section) ? Progress towards OT goals: Progressing toward goals ? ?Acute Rehab OT Goals ?OT Goal Formulation: Patient unable to participate in goal setting ?Time For  Goal Achievement: 01/22/22 ?Potential to Achieve Goals: Good ?ADL Goals ?Pt Will Perform Grooming: with min assist;sitting ?Additional ADL Goal #1: Patient will sit edge of bed with Mod A of one for up to 5 min to increase independence with grooming ?Additional ADL Goal #2: Patient will attend to the L visual field 50% of the time with Min VC's during functional tasks.  ?Plan Discharge plan remains appropriate   ? ?Co-evaluation ? ? ? PT/OT/SLP Co-Evaluation/Treatment: Yes ?Reason for Co-Treatment: Complexity of the patient's impairments (multi-system involvement) ?PT goals addressed during session: Mobility/safety with mobility ?OT goals addressed during session: ADL's and self-care ?  ? ?  ?AM-PAC OT "6 Clicks" Daily Activity     ?Outcome Measure ? ? Help from another person eating meals?: Total ?Help from another person taking care of personal grooming?: Total ?Help from another person toileting, which includes using toliet, bedpan, or urinal?: Total ?Help from another person  bathing (including washing, rinsing, drying)?: Total ?Help from another person to put on and taking off regular upper body clothing?: Total ?Help from another person to put on and taking off regular lower body clothing?: Total ?6 Click Score: 6 ? ?  ?End of Session   ? ?OT Visit Diagnosis: Unsteadiness on feet (R26.81);Muscle weakness (generalized) (M62.81);Feeding difficulties (R63.3);Low vision, both eyes (H54.2);Hemiplegia and hemiparesis ?Hemiplegia - Right/Left: Left ?Hemiplegia - dominant/non-dominant: Non-Dominant ?Hemiplegia - caused by: Cerebral infarction ?  ?Activity Tolerance Patient limited by lethargy ?  ?Patient Left in bed;with bed alarm set ?  ?Nurse Communication Mobility status ?  ? ?   ? ?Time: 7035-0093 ?OT Time Calculation (min): 27 min ? ?Charges: OT General Charges ?$OT Visit: 1 Visit ?OT Treatments ?$Therapeutic Activity: 8-22 mins ? ? ?Darriel Sinquefield A Jimmylee Ratterree ?01/11/2022, 5:31 PM ?

## 2022-01-11 NOTE — Progress Notes (Signed)
Pt has strong, productive cough at this time,  CPT PRN at this time but will reassess frequency if necessary.  RT will cont to monitor. ?

## 2022-01-12 ENCOUNTER — Inpatient Hospital Stay: Payer: Self-pay

## 2022-01-12 DIAGNOSIS — K921 Melena: Secondary | ICD-10-CM

## 2022-01-12 LAB — BASIC METABOLIC PANEL
Anion gap: 7 (ref 5–15)
BUN: 35 mg/dL — ABNORMAL HIGH (ref 8–23)
CO2: 25 mmol/L (ref 22–32)
Calcium: 8.6 mg/dL — ABNORMAL LOW (ref 8.9–10.3)
Chloride: 106 mmol/L (ref 98–111)
Creatinine, Ser: 1.18 mg/dL (ref 0.61–1.24)
GFR, Estimated: >60 mL/min (ref >60)
Glucose, Bld: 149 mg/dL — ABNORMAL HIGH (ref 70–99)
Potassium: 3.8 mmol/L (ref 3.5–5.1)
Sodium: 138 mmol/L (ref 135–145)

## 2022-01-12 LAB — GLUCOSE, CAPILLARY
Glucose-Capillary: 147 mg/dL — ABNORMAL HIGH (ref 70–99)
Glucose-Capillary: 137 mg/dL — ABNORMAL HIGH (ref 70–99)
Glucose-Capillary: 151 mg/dL — ABNORMAL HIGH (ref 70–99)
Glucose-Capillary: 144 mg/dL — ABNORMAL HIGH (ref 70–99)
Glucose-Capillary: 140 mg/dL — ABNORMAL HIGH (ref 70–99)
Glucose-Capillary: 157 mg/dL — ABNORMAL HIGH (ref 70–99)

## 2022-01-12 LAB — CBC
HCT: 33.7 % — ABNORMAL LOW (ref 39.0–52.0)
Hemoglobin: 10.6 g/dL — ABNORMAL LOW (ref 13.0–17.0)
MCH: 29.2 pg (ref 26.0–34.0)
MCHC: 31.5 g/dL (ref 30.0–36.0)
MCV: 92.8 fL (ref 80.0–100.0)
Platelets: 451 10*3/uL — ABNORMAL HIGH (ref 150–400)
RBC: 3.63 MIL/uL — ABNORMAL LOW (ref 4.22–5.81)
RDW: 14.6 % (ref 11.5–15.5)
WBC: 9.6 10*3/uL (ref 4.0–10.5)
nRBC: 0 % (ref 0.0–0.2)

## 2022-01-12 MED ORDER — GERHARDT'S BUTT CREAM
TOPICAL_CREAM | Freq: Four times a day (QID) | CUTANEOUS | Status: DC
  Administered 2022-01-13: 1 via TOPICAL
  Filled 2022-01-12: qty 1

## 2022-01-12 NOTE — TOC Progression Note (Signed)
Transition of Care (TOC) - Progression Note  ? ? ?Patient Details  ?Name: Vincent Ferrell ?MRN: [id number] ?Date of Birth: 10-18-1960 ? ?Transition of Care (TOC) CM/SW Contact  ?Pollie Friar, RN ?Phone Number: ?01/12/2022, 12:21 PM ? ?Clinical Narrative:    ?CM verified with Anderson Malta with [employer] that authorization for [facility] is still pending through [facility].  ?TOC following. ? ? 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 ?Expected Discharge Plan and Services ?Expected Discharge Plan: [place] ?In-house Referral: Clinical Social Work ?  ?Post Acute Care Choice: [place] ?Living arrangements for the past 2 months: [place] ?                ?  ?  ?  ?  ?  ?  ?  ?  ?  ?  ? ? ?Social Determinants of Health (SDOH) Interventions ?  ? ?Readmission Risk Interventions ?   ? View : No data to display.  ?  ?  ?  ? ? ?

## 2022-01-12 NOTE — Progress Notes (Signed)
? ?PROGRESS NOTE ? ? ? ?Vincent Ferrell  XBM:[id number] DOB: 1961/06/11 DOA: 12/25/2021 ?PCP: Lester [place]., MD ? ? ?Brief Narrative: ?Mr. Vincent Ferrell is a 61 y/o gentleman with a history of HTN, HLD, GERD who presented with stroke symptoms, found to have MSSA bacteremia and endocarditis causing embolic strokes and prostate abscess. He underwent tracheostomy for prolonged vent weaning on 4/24. ? ?4/14 Admitted with acute R MCA stroke s/p mechanical thrombectomy with revascularization.  Cards consulted for initial code STEMI with diffuse STE, no STEMI. Required 4 units of pRBCs for lower GI bleed.  ?4/17: Large bloody BM. Bedside colonoscopy with blood throughout colon.  TEE with 1 cm x 0.5 cm vegetation involving both atrial and ventricular sides. Hemoglobin decreased to 6.8. Additional 2 units of pRBCs and FFP given.  ?4/18: Bedside EGD and colonoscopy with evidence of black blood but no source of bleeding identified. MRI of the prostate demonstrated large abscess. Patient taken for drainage by Urology. Cultures obtained.  ?4/19: Propofol switched to Precedex in anticipation of extubation.  ?4/20: Increased agitation with worsening oxygen requirements overnight. Required switch back to Propofol. CXR with atelectasis +/- hypervolemia. Lasix given. TCTS consulted for concern of valvular insufficiency. Repeat echo with increased peri-cardial effusion size.   ?4/22 no significant issues overnight, remains on propofol infusion due to agitation and anxiety  ?4/23 No major issues overnight, MRI brian with expected stroke evolution. On assessment this right knee is red and warm to touch  ?4/24 trach ?4/25 PEG ?4/26 stopping heavy sedation looking good on PSV/ failed ATC attempt ?4/27 getting IV lasix; tolerated several hours of ATC ?4/29 back on ATC. Family at bedside  ? ? ?Assessment and Plan: ? ?Endocarditis of mitral valve ?Blood culture negative. MSSA bacteria from prostate abscess is presumed source. Associated  brain embolic infarcts with concern for septic emboli. Patient has been managed on Nafcillin. CT surgery consulted on 4/20 with recommendation for no surgical management. ?-PICC line ?-Continue nafcillin, monitor renal function; end date of 02/12/2022 ? ?Acute ischemic right MCA stroke ?Presumed secondary embolic etiology from endocarditis. MRI (4/15) confirms acute moderate right MCA/punctate left cerebellum infarcts in addition to subacute small right occipital and left MCA infarcts. Patient underwent mechanical thrombectomy with complete revascularization on 4/14 by neurointerventional radiology. CTA head and neck significant for right M2 occlusion. LDL of 37.7. Hemoglobin A1C of 6.0%. Transesophageal Echocardiogram (4/17) significant for mitral valve vegetation, consistent with infective endocarditis; normal LVEF of 55-60%. Neurology recommendations for no antithrombotic therapy. PT/OT recommendations for inpatient rehabilitation. Recommendation for Neurology follow-up in 4 weeks. ? ?Prostate abscess ?Urology consulted. Transurethral resection performed on 4/18. Urine culture (4/18) from surgery was significant for MSSA. Urology with recommendation to keep foley catheter in for 2 weeks with voiding trial in the clinic. Recommendation for catheter exchange over a wire if needed vs coud? catheter pointed towards the right. Catheter remains in place. ? ?Acute respiratory failure with hypoxia ?Patient required intubation and mechanical ventilation from 4/14 until 4/24; tracheostomy performed on 4/24. ?-PCCM following for tracheostomy care ?-Continue oxygen supplementation ? ?AKI ?Creatinine of 1.9 on admission with peak of 2.33. Now resolved. ? ?Pericardial effusion ?Small. Noted on initial Transthoracic Echocardiogram (4/15) with worsening effusion noted subsequent limited Transthoracic Echocardiogram (4/20). Cardiology consulted on 4/21 with recommendation for no pericardial effusion secondary to no tamponade  features. Repeat limited Transthoracic Echocardiogram (4/25) significant for improved effusion. ? ?Inadequate oral intake ?Secondary to stroke/dysphagia. PEG tube placed on 4/25 by general surgery and tube feeds initiated. ?-Dietitian  recommendations (4/27): ?Change to Jevity 1.5 @ 60 ml/h (1440 ml per day) ?Prosource TF 90 ml BID ?Provides 2320 kcal, 135 gm protein, 1094 ml free water daily ?200 ml free water every 4 hours ?Total free water: 2294 ml  ? ?Acute blood loss Anemia ?Acute GI hemorrhage ?Patient presented with a hemoglobin of 11.9. Patient found to have hemoglobin as low as 6.5 during this admission requiring multiple transfusions. GI consulted on 4/14. Initial colonoscopy performed on 4/17 with upper endoscopy and repeat colonoscopy performed on 4/18. Initial colonoscopy significant for poor prep.  Upper endoscopy and repeat colonoscopy significant for no identifiable source for bleeding; presumed small bowel etiology. GI recommendations for continued serial hemoglobins, transfusion as needed and CT angio abdomen stat if recurrent active bleeding. Patient required 4 units of pRBC, 1 unit of platelets and 1 unit of FFP to date. Hemoglobin now 9-10 and stable. No recurrent GI bleeding noted. ? ?Erosive gastritis ?Noted on upper endoscopy. Recommendation for PPI BID ?-Continue Protonix BID ? ?Multifocal pneumonia ?Per PCCM note, treated with CAP coverage for 5 days. ? ?Primary hypertension ?-Continue lisinopril ? ?Diarrhea ?In setting of tube feeds. ? ?Hyperlipidemia ?-Continue Lipitor ? ? ?DVT prophylaxis: Heparin Subq ?Code Status:   Code Status: Full Code ?Family Communication: Sister and friend at bedside ?Disposition Plan: Possible discharge to [facility] pending insurance authorization ? ? ?Consultants:  ?PCCM ?Cardiology ?Gastroenterology ?CT surgery ?Infectious disease ?General surgery ? ?Procedures:  ?ETT (4/14 >> 4/21, 4/21 >> 4/24) ?Colonoscopy (4/17) ?Colonoscopy/Upper endoscopy (4/18) ?Cortrak  placement (4/21) ?Tracheostomy (4/24>> ? ?Antimicrobials: ?Nafcillin IV ? ? ?Subjective: ?Patient reports no issues (communicates with thumbs up/down). ? ?Objective: ?BP (!) 148/100 (BP Location: Right Arm)   Pulse (!) 122   Temp 98.6 ?F (37 ?C)   Resp 18   Ht 5' 11.5" (1.816 m)   Wt 92.1 kg   SpO2 98%   BMI 27.92 kg/m?  ? ?Examination: ? ?General exam: Appears calm and comfortable ?Respiratory system: Clear to auscultation. Respiratory effort normal. ?Cardiovascular system: S1 & S2 heard, RRR. ?Gastrointestinal system: Abdomen is distended, soft and nontender. Normal bowel sounds heard. ?Central nervous system: Alert. Communicates with thumb up/down using right hand. Left hemiparesis. Right strength 2-3/5 ?Musculoskeletal: LUE edema.  ? ? ?Data Reviewed: I have personally reviewed following labs and imaging studies ? ?CBC ?Lab Results  ?Component Value Date  ? WBC 9.6 01/12/2022  ? RBC 3.63 (L) 01/12/2022  ? HGB 10.6 (L) 01/12/2022  ? HCT 33.7 (L) 01/12/2022  ? MCV 92.8 01/12/2022  ? MCH 29.2 01/12/2022  ? PLT 451 (H) 01/12/2022  ? MCHC 31.5 01/12/2022  ? RDW 14.6 01/12/2022  ? LYMPHSABS 1.2 01/05/2022  ? MONOABS 0.8 01/05/2022  ? EOSABS 0.3 01/05/2022  ? BASOSABS 0.1 01/05/2022  ? ? ? ?Last metabolic panel ?Lab Results  ?Component Value Date  ? NA 138 01/12/2022  ? K 3.8 01/12/2022  ? CL 106 01/12/2022  ? CO2 25 01/12/2022  ? BUN 35 (H) 01/12/2022  ? CREATININE 1.18 01/12/2022  ? GLUCOSE 149 (H) 01/12/2022  ? GFRNONAA >60 01/12/2022  ? CALCIUM 8.6 (L) 01/12/2022  ? PHOS 5.0 (H) 01/06/2022  ? PROT 6.3 (L) 01/07/2022  ? ALBUMIN 1.8 (L) 01/07/2022  ? BILITOT 0.7 01/07/2022  ? ALKPHOS 76 01/07/2022  ? AST 43 (H) 01/07/2022  ? ALT 30 01/07/2022  ? ANIONGAP 7 01/12/2022  ? ? ?GFR: ?Estimated Creatinine Clearance: 76.9 mL/min (by C-G formula based on SCr of 1.18 mg/dL). ? ?Recent Results (from  the past 240 hour(s))  ?Culture, Respiratory w Gram Stain     Status: None  ? Collection Time: 01/09/22 11:09 AM  ?  Specimen: Tracheal Aspirate; Respiratory  ?Result Value Ref Range Status  ? Specimen Description TRACHEAL ASPIRATE  Final  ? Special Requests NONE  Final  ? Gram Stain   Final  ?  MODERATE SQUAMOUS EPITHELIAL CELLS PRESENT ?R

## 2022-01-12 NOTE — Progress Notes (Addendum)
ID Brief Note  ? ?OK to place PICC for long term IV abtx ? ?ID pharmacy to place OPAT orders when ready to be discharged  ? ?Diagnosis: Native MV endocarditis  ? ?Culture Result: MSSA from prostate abscess ( blood cx negative) ? ?Allergies  ?Allergen Reactions  ? Pollen Extract Other (See Comments)  ?  Sneezing   ? ? ?OPAT Orders ?Discharge antibiotics to be given via PICC line ?Discharge antibiotics: nafcillin ?Per pharmacy protocol  ?Duration: 6 weeks  ?End Date: 02/12/22 ? ?[facility] Care Per Protocol: ? ?Home health RN for IV administration and teaching; PICC line care and labs.   ? ?Labs weekly while on IV antibiotics: ?X_ CBC with differential ?X__ BMP ?__ CMP ?__ CRP ?__ ESR ?__ Vancomycin trough ?__ CK ? ?X__ Please pull PIC at completion of IV antibiotics ?__ Please leave PIC in place until doctor has seen patient or been notified ? ?Fax weekly labs to [phone] ? ?Clinic Follow Up Appt: 02/11/22 ? ?Rosiland Oz, MD ?Infectious Disease Physician ?[facility] ?[address] ?[place], [place] [address] ?Phone: [phone]  Fax: [phone] ? ? ? ?

## 2022-01-12 NOTE — Progress Notes (Signed)
PHARMACY CONSULT NOTE FOR: ? ?OUTPATIENT  PARENTERAL ANTIBIOTIC THERAPY (OPAT) ? ?Indication: MSSA bacteremia/endocarditis of the mitral valve ?Regimen: Nafcillin IVPB 12g q24h as a continuous infusion  ?End date: 02/12/22 ? ?IV antibiotic discharge orders are pended. ?To discharging provider:  please sign these orders via discharge navigator,  ?Select New Orders & click on the button choice - Manage This Unsigned Work.  ?  ? ?Thank you for allowing pharmacy to be a part of this patient's care. ? ?Rushie Goltz ?PGY1 Pharmacy Resident ?01/12/2022, 9:13 AM ? ?

## 2022-01-13 LAB — BASIC METABOLIC PANEL
Anion gap: 8 (ref 5–15)
BUN: 34 mg/dL — ABNORMAL HIGH (ref 8–23)
CO2: 27 mmol/L (ref 22–32)
Calcium: 8.9 mg/dL (ref 8.9–10.3)
Chloride: 109 mmol/L (ref 98–111)
Creatinine, Ser: 1.29 mg/dL — ABNORMAL HIGH (ref 0.61–1.24)
GFR, Estimated: >60 mL/min (ref >60)
Glucose, Bld: 139 mg/dL — ABNORMAL HIGH (ref 70–99)
Potassium: 4 mmol/L (ref 3.5–5.1)
Sodium: 144 mmol/L (ref 135–145)

## 2022-01-13 LAB — CBC
HCT: 33.8 % — ABNORMAL LOW (ref 39.0–52.0)
Hemoglobin: 10.8 g/dL — ABNORMAL LOW (ref 13.0–17.0)
MCH: 29.7 pg (ref 26.0–34.0)
MCHC: 32 g/dL (ref 30.0–36.0)
MCV: 92.9 fL (ref 80.0–100.0)
Platelets: 441 10*3/uL — ABNORMAL HIGH (ref 150–400)
RBC: 3.64 MIL/uL — ABNORMAL LOW (ref 4.22–5.81)
RDW: 15.2 % (ref 11.5–15.5)
WBC: 9.4 10*3/uL (ref 4.0–10.5)
nRBC: 0 % (ref 0.0–0.2)

## 2022-01-13 LAB — GLUCOSE, CAPILLARY
Glucose-Capillary: 133 mg/dL — ABNORMAL HIGH (ref 70–99)
Glucose-Capillary: 149 mg/dL — ABNORMAL HIGH (ref 70–99)
Glucose-Capillary: 144 mg/dL — ABNORMAL HIGH (ref 70–99)
Glucose-Capillary: 142 mg/dL — ABNORMAL HIGH (ref 70–99)
Glucose-Capillary: 157 mg/dL — ABNORMAL HIGH (ref 70–99)
Glucose-Capillary: 131 mg/dL — ABNORMAL HIGH (ref 70–99)

## 2022-01-13 MED ORDER — SODIUM CHLORIDE 0.9% FLUSH
10.0000 mL | Freq: Two times a day (BID) | INTRAVENOUS | Status: DC
  Administered 2022-01-13 – 2022-01-15 (×5): 10 mL

## 2022-01-13 MED ORDER — SODIUM CHLORIDE 0.9% FLUSH: 10.0000 mL | INTRAVENOUS | Status: DC | PRN

## 2022-01-13 NOTE — Progress Notes (Signed)
Physical Therapy Treatment ?Patient Details ?Name: Vincent Ferrell ?MRN: [id number] ?DOB: 06/03/1961 ?Today's Date: 01/13/2022 ? ? ?History of Present Illness Pt is 61 yo male who presents with L side weakness, L facial droop, and R gaze deviation. He had been dx with bronchopneumonia on 4/11. 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 Pt required intubation on 4/14. Pt with lower GIB, colonoscopy 4/17 and upper GI endoscopy 4/18. Pt underwent Transurethral resection of the prostatic abscess on 4/18. PMH: HLD, HTN, elevated glucose.  Had desats and ETT advanced on 4/20, then underwent trach and bronch on 4/24, and PEG placed 4/25. ? ?  ?PT Comments  ? ? Received pt semi-reclined in bed with RN administering medication. Pt non-verbal and communicated via thumbs up/down gestures. Noted pt with R gaze preference and tightness in cervical musculature. Pt transferred semi-reclined<>sitting EOB with total A + 2 and worked on sitting balance, core strengthening, midline orientation, upright posture, and attention with max/+2 assist for sitting balance. Transferred back to supine with total A +2 and worked on bed level exercises to fatigue (pt began closing eyes). Per visitor, Alecia Lemming, pt is improving daily. Acute PT to cont to follow.  ?   ?Recommendations for follow up therapy are one component of a multi-disciplinary discharge planning process, led by the attending physician.  Recommendations may be updated based on patient status, additional functional criteria and insurance authorization. ? ?Follow Up Recommendations ? PT at Long-term acute care hospital ?  ?  ?Assistance Recommended at Discharge Frequent or constant Supervision/Assistance  ?Patient can return home with the following Two people to help with walking and/or transfers;Assistance with cooking/housework;Two people to help with bathing/dressing/bathroom;Assist for transportation;Direct supervision/assist for financial management;Assistance with  feeding;Direct supervision/assist for medications management;Help with stairs or ramp for entrance ?  ?Equipment Recommendations ? Hospital bed;Other (comment) (hoyer lift)  ?  ?Recommendations for Other Services   ? ? ?  ?Precautions / Restrictions Precautions ?Precautions: Fall ?Precaution Comments: L hemiplegia, trach and PEG, watch HR ?Restrictions ?Weight Bearing Restrictions: No  ?  ? ?Mobility ? Bed Mobility ?Overal bed mobility: Needs Assistance ?Bed Mobility: Rolling, Supine to Sit, Sit to Supine ?Rolling: Max assist ?  ?Supine to sit: Total assist, +2 for physical assistance ?Sit to supine: Total assist, +2 for physical assistance ?  ?General bed mobility comments: Pt required manual faciliation to achieve hooklying position to roll and assist to protect LUE. HOB elevated ?Patient Response: Cooperative ? ?Transfers ?  ?  ?  ?  ?  ?  ?  ?  ?  ?  ?  ? ?Ambulation/Gait ?  ?  ?  ?  ?  ?  ?  ?  ? ? ?Stairs ?  ?  ?  ?  ?  ? ? ?Wheelchair Mobility ?  ? ?Modified Rankin (Stroke Patients Only) ?Modified Rankin (Stroke Patients Only) ?Modified Rankin: Severe disability ? ? ?  ?Balance Overall balance assessment: Needs assistance ?Sitting-balance support: No upper extremity supported, Feet supported ?Sitting balance-Leahy Scale: Zero ?Sitting balance - Comments: Pt required max A ranging to +2 assist to maintain static sitting balance. ?Postural control: Left lateral lean ?  ?  ?  ?  ?  ?  ?  ?  ?  ?  ?  ?  ?  ?  ?  ? ?  ?Cognition Arousal/Alertness: Lethargic ?Behavior During Therapy: Flat affect ?Overall Cognitive Status: Difficult to assess ?  ?  ?  ?  ?  ?  ?  ?  ?  ?  ?  ?  ?  ?  ?  ?  ?  General Comments: Pt with R gaze preference, follows commands, communicates via thumbs up/down gestures with RUE ?  ?  ? ?  ?Exercises General Exercises - Lower Extremity ?Ankle Circles/Pumps: AROM, Both, 10 reps ?Heel Slides: AAROM, Right, 10 reps ?Hip ABduction/ADduction: AAROM, Right, 10 reps ? ?  ?General Comments General  comments (skin integrity, edema, etc.): pt with R gaze preference and difficulty achieving cervial extension and L rotation despite manual assist ?  ?  ? ?Pertinent Vitals/Pain Pain Assessment ?Pain Assessment: Faces ?Faces Pain Scale: Hurts a little bit ?Pain Location: neck and LE's with movement ?Pain Descriptors / Indicators: Grimacing ?Pain Intervention(s): Limited activity within patient's tolerance, Monitored during session, Premedicated before session, Repositioned  ? ? ?Home Living   ?  ?  ?  ?  ?  ?  ?  ?  ?  ?   ?  ?Prior Function    ?  ?  ?   ? ?PT Goals (current goals can now be found in the care plan section) Acute Rehab PT Goals ?Patient Stated Goal: per sister for rehab ?PT Goal Formulation: With family ?Time For Goal Achievement: 01/20/22 ?Potential to Achieve Goals: Fair ? ?  ?Frequency ? ? ? Min 3X/week ? ? ? ?  ?PT Plan    ? ? ?Co-evaluation   ?  ?PT goals addressed during session: Balance;Strengthening/ROM ?  ?  ? ?  ?AM-PAC PT "6 Clicks" Mobility   ?Outcome Measure ? Help needed turning from your back to your side while in a flat bed without using bedrails?: Total ?Help needed moving from lying on your back to sitting on the side of a flat bed without using bedrails?: Total ?Help needed moving to and from a bed to a chair (including a wheelchair)?: Total ?Help needed standing up from a chair using your arms (e.g., wheelchair or bedside chair)?: Total ?Help needed to walk in hospital room?: Total ?Help needed climbing 3-5 steps with a railing? : Total ?6 Click Score: 6 ? ?  ?End of Session   ?Activity Tolerance: Patient limited by fatigue ?Patient left: in bed;with call bell/phone within reach;with bed alarm set;with nursing/sitter in room ?Nurse Communication: Mobility status;Need for lift equipment ?PT Visit Diagnosis: Other abnormalities of gait and mobility (R26.89);Muscle weakness (generalized) (M62.81);Other symptoms and signs involving the nervous system (R29.898);Hemiplegia and  hemiparesis ?Hemiplegia - Right/Left: Left ?Hemiplegia - dominant/non-dominant: Non-dominant ?Hemiplegia - caused by: Cerebral infarction ?  ? ? ?Time: 0370-4888 ?PT Time Calculation (min) (ACUTE ONLY): 17 min ? ?Charges:  $Therapeutic Activity: 8-22 mins          ?          ? ?Raechel Chute PT, DPT  ?Alfonso Patten ?01/13/2022, 12:13 PM ? ?

## 2022-01-13 NOTE — Progress Notes (Signed)
Peripherally Inserted Central Catheter Placement ? ?The IV Nurse has discussed with the patient and/or persons authorized to consent for the patient, the purpose of this procedure and the potential benefits and risks involved with this procedure.  The benefits include less needle sticks, lab draws from the catheter, and the patient may be discharged home with the catheter. Risks include, but not limited to, infection, bleeding, blood clot (thrombus formation), and puncture of an artery; nerve damage and irregular heartbeat and possibility to perform a PICC exchange if needed/ordered by physician.  Alternatives to this procedure were also discussed.  Bard Power PICC patient education guide, fact sheet on infection prevention and patient information card has been provided to patient /or left at bedside.   ?Consent obtained with sister, Eustaquio Maize, via telephone ?PICC Placement Documentation  ?PICC Single Lumen 01/13/22 Right Brachial 39 cm 0 cm (Active)  ?Indication for Insertion or Continuance of Line Prolonged intravenous therapies 01/13/22 0900  ?Exposed Catheter (cm) 0 cm 01/13/22 0900  ?Site Assessment Clean, Dry, Intact 01/13/22 0900  ?Line Status Flushed;Saline locked;Blood return noted 01/13/22 0900  ?Dressing Type Transparent;Securing device 01/13/22 0900  ?Dressing Status Antimicrobial disc in place 01/13/22 0900  ?Safety Lock Not Applicable [id number]  ?Line Care Connections checked and tightened 01/13/22 0900  ?Dressing Intervention New dressing 01/13/22 0900  ?Dressing Change Due 01/20/22 01/13/22 0900  ? ? ? ? ? ?Holley Bouche Renee ?01/13/2022, 9:57 AM ? ?

## 2022-01-13 NOTE — Progress Notes (Signed)
Nutrition Follow-up ? ?DOCUMENTATION CODES:  ?Not applicable ? ?INTERVENTION:  ?Continue tube feeding via cortrak tube: ?Jevity 1.5 @ 60 ml/h (1440 ml per day) ?Prosource TF 90 ml BID ?28m free water q4h ?Provides 2320 kcal, 135 gm protein, 2294 ml (TF+flush) ? ?NUTRITION DIAGNOSIS:  ?Inadequate oral intake related to inability to eat as evidenced by NPO status. ?- Ongoing.  ? ?GOAL:  ?Patient will meet greater than or equal to 90% of their needs ?- Met with TF at goal  ? ?MONITOR:  ?Vent status, Labs ? ?REASON FOR ASSESSMENT:  ?Consult ? (Re-assess for diarrhea) ? ?ASSESSMENT:  ?61y.o. male presented to the ED with L side weakness, facial droop, and R gaze deviation. PMH includes HTN. Pt admitted with R MCA stroke with right MCA M2 occlusion and sepsis. ?  ?4/14 - intubated, acute R MCA stroke s/p mechanical thrombectomy ?4/17 - s/p bedside colonoscopy; s/p TEE with MV endocarditis ?4/18 - s/p bedside EGD and repeat colonoscopy, large prostate abscess per MRI  ?4/21 - s/p cortrak placement; tip distal stomach or pyloric region  ?4/24 - s/p trach placement  ?4/25 - PEG placement ?4/28 - SLP started working on using PMSV  ?5/1 - transferred out of ICU ? ?Pt resting in bed at the time of assessment. Awake, but unable to respond at this time. SLP has been working with pt on using passy-muir valve but is not ready for swallow assessment yet. ? ?Noted TF infusing at goal rate at beside. Appears to be well tolerated.  ? ?Current TF:  ?Jevity 1.5 @ 60 ml/h ?Prosource TF 90 ml BID ?Provides 2320 kcal, 135 gm protein, 1094 ml free water daily ? ?Nutritionally Relevant Medications: ?Scheduled Meds: ? atorvastatin  40 mg Per Tube Daily  ? PROSource TF  90 mL Per Tube BID  ? fiber  1 packet Per Tube BID  ? free water  200 mL Per Tube Q8H  ? insulin aspart  0-15 Units Subcutaneous Q4H  ? pantoprazole sodium  40 mg Per Tube BID  ? ?Continuous Infusions: ? JEVITY 1.5 CAL/FIBER 1,000 mL (01/12/22 2114)  ? nafcillin (NAFCIL)  continuous infusion 12 g (01/12/22 1146)  ? ?Labs Reviewed: ?BUN 34, creatinine 1.29 ?CBG ranges from 133-151 mg/dL over the last 24 hours ?HgbA1c 6% (4/14) ? ?Diet Order:   ?Diet Order   ? ?       ?  Diet NPO time specified  Diet effective now       ?  ? ?  ?  ? ?  ? ? ?EDUCATION NEEDS:  ?Not appropriate for education at this time ? ?Skin:  Reviewed  ? ?Last BM:  5/3 (rectal tube has been removed) ? ?Height:  ?Ht Readings from Last 1 Encounters:  ?12/25/21 5' 11.5" (1.816 m)  ? ?Weight:  ?Wt Readings from Last 1 Encounters:  ?01/03/22 92.1 kg  ? ?BMI:  Body mass index is 27.92 kg/m?. ? ?Estimated Nutritional Needs:  ?Kcal:  2000-2200 ?Protein:  100-115 grams ?Fluid:  >/= 2 L ? ? ?RRanell Patrick RD, LDN ?Clinical Dietitian ?RD pager # available in [place] ?After hours/weekend pager # available in [place]?

## 2022-01-13 NOTE — Progress Notes (Signed)
Patient seen today by trach team for consult.  No education is needed at this time.  All necessary equipment is at beside.   Will continue to follow for progression.  

## 2022-01-13 NOTE — Progress Notes (Signed)
Speech Language Pathology Treatment: [place] Speaking valve;Cognitive-Linquistic  ?Patient Details ?Name: Vincent Ferrell ?MRN: [id number] ?DOB: 27-May-1961 ?Today's Date: 01/13/2022 ?Time: 0932-6712 ?SLP Time Calculation (min) (ACUTE ONLY): 19 min ? ?Assessment / Plan / Recommendation ?Clinical Impression ? Treatment focused on communication with PMV and dysarthria/language abilities. He was drowsy and needed mild stimulation increasing to moderate by end of session. PMV worn throughout with HR 112, SpO2 98% and RR 17-32. He had congested respirations and attempted to clear, cough most times were unsuccessful to expectorate and therapist used [place] incompletely clearing . On attempts to speak there were reflexive swallows interfering.  He mouthed words and vocalized 30% of opportunities with reduced intelligibility with decreased labial movement and significantly low/hoarse quality. He said his name x 1, attempted to count in unison achieving "one" and repeated one short phrase. PMV doffed and pt started to fall asleep. He is not yet appropriate for swallow assessment. ST will continue.  ?  ?HPI HPI: Pt is a 61 y/o male who presented 4/14 with L side weakness, L facial droop, and R gaze deviation. Aphasia and dysathria also noted in the ED by RN. CT head 4/14: Subacute appearing small infarct in the right PCA territory, occipital pole, at least two small areas of chronic appearing  posterior Left MCA territory encephalomalacia. CTA head/neck and perfusion study showed acute right MCA anterior M2 occlusion. Pt s/p emergent mechanical thrombectomy with complete revascularization 4/14. ETT 4/14-trach 4/24. MRI brain 4/23: Expected evolution of subacute ischemia in the right MCA  territory, right occipital lobe and left frontal and parietal white matter. Petechial blood in the left parietal lobe. Question of GIB; pt s/p colonoscopy 4/17 and EGD 4/18 which showed black blood, but no evidence of bleeding. MRI of the  prostate demonstrated large abscess; pt s/p transurethral resection of the prostatic abscess on 4/18. G-tube placed 4/25. PMH: HLD, HTN, GERD. ?  ?   ?SLP Plan ? Continue with current plan of care ? ?  ?  ?Recommendations for follow up therapy are one component of a multi-disciplinary discharge planning process, led by the attending physician.  Recommendations may be updated based on patient status, additional functional criteria and insurance authorization. ?  ? ?Recommendations  ?   ?   ? Patient may use Passy-Muir Speech Valve: with SLP only ?PMSV Supervision: Full ?MD: Please consider changing trach tube to : Cuffless  ?   ? ? ? ? Oral Care Recommendations: Oral care QID ?Follow Up Recommendations: Skilled nursing-short term rehab (<3 hours/day) ?Assistance recommended at discharge: Frequent or constant Supervision/Assistance ?SLP Visit Diagnosis: Cognitive communication deficit (R41.841);Dysarthria and anarthria (R47.1) ?Plan: Continue with current plan of care ? ? ? ? ?  ?  ? ? ?Royce Macadamia ? ?01/13/2022, 2:40 PM ?

## 2022-01-13 NOTE — Progress Notes (Signed)
? ?PROGRESS NOTE ? ? ? ?Vincent Ferrell  XBM:[id number] DOB: 1961/06/11 DOA: 12/25/2021 ?PCP: Vincent [place]., MD ? ? ?Brief Narrative: ?Vincent Ferrell is a 61 y/o gentleman with a history of HTN, HLD, GERD who presented with stroke symptoms, found to have MSSA bacteremia and endocarditis causing embolic strokes and prostate abscess. He underwent tracheostomy for prolonged vent weaning on 4/24. ? ?4/14 Admitted with acute R MCA stroke s/p mechanical thrombectomy with revascularization.  Cards consulted for initial code STEMI with diffuse STE, no STEMI. Required 4 units of pRBCs for lower GI bleed.  ?4/17: Large bloody BM. Bedside colonoscopy with blood throughout colon.  TEE with 1 cm x 0.5 cm vegetation involving both atrial and ventricular sides. Hemoglobin decreased to 6.8. Additional 2 units of pRBCs and FFP given.  ?4/18: Bedside EGD and colonoscopy with evidence of black blood but no source of bleeding identified. MRI of the prostate demonstrated large abscess. Patient taken for drainage by Urology. Cultures obtained.  ?4/19: Propofol switched to Precedex in anticipation of extubation.  ?4/20: Increased agitation with worsening oxygen requirements overnight. Required switch back to Propofol. CXR with atelectasis +/- hypervolemia. Lasix given. TCTS consulted for concern of valvular insufficiency. Repeat echo with increased peri-cardial effusion size.   ?4/22 no significant issues overnight, remains on propofol infusion due to agitation and anxiety  ?4/23 No major issues overnight, MRI brian with expected stroke evolution. On assessment this right knee is red and warm to touch  ?4/24 trach ?4/25 PEG ?4/26 stopping heavy sedation looking good on PSV/ failed ATC attempt ?4/27 getting IV lasix; tolerated several hours of ATC ?4/29 back on ATC. Family at bedside  ? ? ?Assessment and Plan: ? ?Endocarditis of mitral valve ?Blood culture negative. MSSA bacteria from prostate abscess is presumed source. Associated  brain embolic infarcts with concern for septic emboli. Patient has been managed on Nafcillin. CT surgery consulted on 4/20 with recommendation for no surgical management. ?-PICC line ?-Continue nafcillin, monitor renal function; end date of 02/12/2022 ? ?Acute ischemic right MCA stroke ?Presumed secondary embolic etiology from endocarditis. MRI (4/15) confirms acute moderate right MCA/punctate left cerebellum infarcts in addition to subacute small right occipital and left MCA infarcts. Patient underwent mechanical thrombectomy with complete revascularization on 4/14 by neurointerventional radiology. CTA head and neck significant for right M2 occlusion. LDL of 37.7. Hemoglobin A1C of 6.0%. Transesophageal Echocardiogram (4/17) significant for mitral valve vegetation, consistent with infective endocarditis; normal LVEF of 55-60%. Neurology recommendations for no antithrombotic therapy. PT/OT recommendations for inpatient rehabilitation. Recommendation for Neurology follow-up in 4 weeks. ? ?Prostate abscess ?Urology consulted. Transurethral resection performed on 4/18. Urine culture (4/18) from surgery was significant for MSSA. Urology with recommendation to keep foley catheter in for 2 weeks with voiding trial in the clinic. Recommendation for catheter exchange over a wire if needed vs coud? catheter pointed towards the right. Catheter remains in place. ? ?Acute respiratory failure with hypoxia ?Patient required intubation and mechanical ventilation from 4/14 until 4/24; tracheostomy performed on 4/24. ?-PCCM following for tracheostomy care ?-Continue oxygen supplementation ? ?AKI ?Creatinine of 1.9 on admission with peak of 2.33. Now resolved. ? ?Pericardial effusion ?Small. Noted on initial Transthoracic Echocardiogram (4/15) with worsening effusion noted subsequent limited Transthoracic Echocardiogram (4/20). Cardiology consulted on 4/21 with recommendation for no pericardial effusion secondary to no tamponade  features. Repeat limited Transthoracic Echocardiogram (4/25) significant for improved effusion. ? ?Inadequate oral intake ?Secondary to stroke/dysphagia. PEG tube placed on 4/25 by general surgery and tube feeds initiated. ?-Dietitian  recommendations (4/27): ?Change to Jevity 1.5 @ 60 ml/h (1440 ml per day) ?Prosource TF 90 ml BID ?Provides 2320 kcal, 135 gm protein, 1094 ml free water daily ?200 ml free water every 4 hours ?Total free water: 2294 ml  ? ?Acute blood loss Anemia ?Acute GI hemorrhage ?Patient presented with a hemoglobin of 11.9. Patient found to have hemoglobin as low as 6.5 during this admission requiring multiple transfusions. GI consulted on 4/14. Initial colonoscopy performed on 4/17 with upper endoscopy and repeat colonoscopy performed on 4/18. Initial colonoscopy significant for poor prep.  Upper endoscopy and repeat colonoscopy significant for no identifiable source for bleeding; presumed small bowel etiology. GI recommendations for continued serial hemoglobins, transfusion as needed and CT angio abdomen stat if recurrent active bleeding. Patient required 4 units of pRBC, 1 unit of platelets and 1 unit of FFP to date. Hemoglobin now 9-10 and stable. No recurrent GI bleeding noted. ? ?Erosive gastritis ?Noted on upper endoscopy. Recommendation for PPI BID ?-Continue Protonix BID ? ?Multifocal pneumonia ?Per PCCM note, treated with CAP coverage for 5 days. ? ?Primary hypertension ?-Continue lisinopril ? ?Diarrhea ?In setting of tube feeds. ? ?Hyperlipidemia ?-Continue Lipitor ? ?Left upper extremity edema. ?We will check Doppler to rule out DVT. ?Suspect thrombophlebitis. ? ?DVT prophylaxis: Heparin Subq ?Code Status:   Code Status: Full Code ?Family Communication: None at bedside ?Disposition Plan: Possible discharge to [facility] pending insurance authorization ? ? ?Consultants:  ?PCCM ?Cardiology ?Gastroenterology ?CT surgery ?Infectious disease ?General surgery ? ?Procedures:  ?ETT (4/14 >> 4/21,  4/21 >> 4/24) ?Colonoscopy (4/17) ?Colonoscopy/Upper endoscopy (4/18) ?Cortrak placement (4/21) ?Tracheostomy (4/24>> ? ?Antimicrobials: ?Nafcillin IV ? ? ?Subjective: ?Denies any acute complaint.  No nausea no vomiting.  Nonverbal.  Able to follow commands.   ? ?Objective: ?BP 124/83 (BP Location: Left Arm)   Pulse (!) 115   Temp 99.1 ?F (37.3 ?C) (Oral)   Resp (!) 21   Ht 5' 11.5" (1.816 m)   Wt 88.1 kg   SpO2 94%   BMI 26.71 kg/m?  ? ?Examination: ?General: Appear in mild distress, no Rash; Oral Mucosa Clear, moist. no Abnormal Neck Mass Or lumps, Conjunctiva normal  ?Cardiovascular: S1 and S2 Present, no Murmur, ?Respiratory: good respiratory effort, Bilateral Air entry present and CTA, no Crackles, no wheezes ?Abdomen: Bowel Sound present, Soft and no tenderness ?Extremities: no Pedal edema ?Neurology: alert and oriented to time, place, and person ?affect appropriate.  Left hemiparesis.  Right-sided weakness as well.  Left upper extremity edema. ?Gait not checked due to patient safety concerns  ? ?Data Reviewed:  ?I have Reviewed nursing notes, Vitals, and Lab results since pt's last encounter. Pertinent lab results CBC and BMP ?I have ordered imaging studies left upper extremity Doppler..  ? ? ? ? LOS: 19 days  ?Electronically signed: ?Lynden Oxford, MD ?[facility] ?01/13/2022 8:20 PM   ? ?If 7PM-7AM, please contact night-coverage ?[email or web address] ? ?

## 2022-01-14 ENCOUNTER — Inpatient Hospital Stay (HOSPITAL_COMMUNITY): Payer: Managed Care, Other (non HMO)

## 2022-01-14 DIAGNOSIS — I059 Rheumatic mitral valve disease, unspecified: Secondary | ICD-10-CM | POA: Diagnosis not present

## 2022-01-14 LAB — GLUCOSE, CAPILLARY
Glucose-Capillary: 135 mg/dL — ABNORMAL HIGH (ref 70–99)
Glucose-Capillary: 168 mg/dL — ABNORMAL HIGH (ref 70–99)
Glucose-Capillary: 130 mg/dL — ABNORMAL HIGH (ref 70–99)
Glucose-Capillary: 149 mg/dL — ABNORMAL HIGH (ref 70–99)
Glucose-Capillary: 150 mg/dL — ABNORMAL HIGH (ref 70–99)

## 2022-01-14 LAB — BASIC METABOLIC PANEL
Anion gap: 9 (ref 5–15)
BUN: 38 mg/dL — ABNORMAL HIGH (ref 8–23)
CO2: 26 mmol/L (ref 22–32)
Calcium: 9 mg/dL (ref 8.9–10.3)
Chloride: 110 mmol/L (ref 98–111)
Creatinine, Ser: 1.29 mg/dL — ABNORMAL HIGH (ref 0.61–1.24)
GFR, Estimated: >60 mL/min (ref >60)
Glucose, Bld: 128 mg/dL — ABNORMAL HIGH (ref 70–99)
Potassium: 3.9 mmol/L (ref 3.5–5.1)
Sodium: 145 mmol/L (ref 135–145)

## 2022-01-14 NOTE — Hospital Course (Addendum)
Mr. Knust is a 61 y/o gentleman with a history of HTN, HLD, GERD who presented with stroke symptoms, found to have MSSA bacteremia and endocarditis causing embolic strokes and prostate abscess.   ?4/14 Admitted with acute R MCA stroke s/p mechanical thrombectomy with revascularization.  Cards consulted for initial code STEMI. Required 4 units of pRBCs for lower GI bleed.  ?4/17: Large bloody BM. Bedside colonoscopy with blood throughout colon. TEE with 1 cm x 0.5 cm vegetation involving both atrial and ventricular sides. Hemoglobin decreased to 6.8. Additional 2 units of pRBCs and FFP given.  ?4/18: Bedside EGD and colonoscopy with evidence of black blood but no source of bleeding identified. MRI of the prostate demonstrated large abscess. Patient taken for drainage by Urology. Cultures obtained.  ?4/20: TCTS consulted for concern of valvular insufficiency. Repeat echo with increased peri-cardial effusion size.   ?4/23 MRI brian with expected stroke evolution. ?4/24 trach ?4/25 PEG, repeat Echocardiogram shows improved pericardial effusion. ?

## 2022-01-14 NOTE — Progress Notes (Signed)
?Progress Note ?PatientOz Ferrell [id number] DOB: 1961/08/29 DOA: 12/25/2021  ?DOS: the patient was seen and examined on 01/14/2022 ? ?Brief hospital course: ?[name] is a 61 y/o gentleman with a history of HTN, HLD, GERD who presented with stroke symptoms, found to have MSSA bacteremia and endocarditis causing embolic strokes and prostate abscess. He underwent tracheostomy for prolonged vent weaning on 4/24. ?  ?4/14 Admitted with acute R MCA stroke s/p mechanical thrombectomy with revascularization.  Cards consulted for initial code STEMI. Required 4 units of pRBCs for lower GI bleed.  ?4/17: Large bloody BM. Bedside colonoscopy with blood throughout colon. TEE with 1 cm x 0.5 cm vegetation involving both atrial and ventricular sides. Hemoglobin decreased to 6.8. Additional 2 units of pRBCs and FFP given.  ?4/18: Bedside EGD and colonoscopy with evidence of black blood but no source of bleeding identified. MRI of the prostate demonstrated large abscess. Patient taken for drainage by Urology. Cultures obtained.  ?4/20: TCTS consulted for concern of valvular insufficiency. Repeat echo with increased peri-cardial effusion size.   ?4/23 MRI brian with expected stroke evolution. ?4/24 trach ?4/25 PEG  ? ?Assessment and Plan: ?Infective endocarditis of mitral valve ?MSSA bacteria from prostate abscess is presumed source.  ?Associated brain embolic infarcts with concern for septic emboli. Patient has been managed on Nafcillin. CT surgery consulted on 4/20 with recommendation for no surgical management. ?-Continue PICC line ?-Continue nafcillin, monitor renal function; end date of 02/12/2022 ?  ?Acute ischemic right MCA stroke ?Presumed secondary embolic etiology from endocarditis.  ?MRI (4/15) confirms acute moderate right MCA/punctate left cerebellum infarcts in addition to subacute small right occipital and left MCA infarcts.  ?Patient underwent mechanical thrombectomy with complete revascularization on  4/14 by neurointerventional radiology.  ?CTA head and neck significant for right M2 occlusion.  ?LDL of 37.7. Hemoglobin A1C of 6.0%.  ?Neurology recommendations for no antithrombotic therapy.  ?Neurology follow-up in 4 weeks. ?  ?Prostate abscess ?Urology consulted.  ?Transurethral resection performed on 4/18.  ?Urine culture (4/18) from surgery was significant for MSSA.  ?Urology with recommendation to keep foley catheter in for 2 weeks starting 4/18 with voiding trial in the clinic.  ?Recommendation for catheter exchange over a wire if needed vs coud? catheter pointed towards the right. Catheter remains in place. ?  ?Acute respiratory failure with hypoxia ?Patient required intubation and mechanical ventilation from 4/14 until 4/24; tracheostomy performed on 4/24. ?-PCCM following for tracheostomy care ?-Continue oxygen supplementation ?  ?AKI ?Creatinine of 1.9 on admission with peak of 2.33. Now resolved. ?  ?Pericardial effusion ?Small. Noted on initial Transthoracic Echocardiogram (4/15) with worsening effusion noted subsequent limited Transthoracic Echocardiogram (4/20). Repeat limited Transthoracic Echocardiogram (4/25) significant for improved effusion. ? ?Inadequate oral intake ?Secondary to stroke/dysphagia. ?PEG tube placed on 4/25 by general surgery and tube feeds initiated. ?  ?Acute blood loss Anemia ?Acute GI hemorrhage ?Patient presented with a hemoglobin of 11.9.  Trended down to 6.5. ?GI consulted on 4/14. Initial colonoscopy performed on 4/17 with upper endoscopy and repeat colonoscopy performed on 4/18. Upper endoscopy and repeat colonoscopy significant for no identifiable source for bleeding; presumed small bowel etiology.  ?GI recommendations for continued serial hemoglobins, transfusion as needed and CT angio abdomen stat if recurrent active bleeding.  ?Patient required 4 units of pRBC, 1 unit of platelets and 1 unit of FFP to date.  ?Hemoglobin now 9-10 and stable. No recurrent GI bleeding  noted. ?Continue PPI BID ?  ?Multifocal pneumonia ?Treated with CAP coverage for  5 days. ?  ?Primary hypertension ?Continue lisinopril ?  ?Diarrhea- resolved. ?In setting of tube feeds. ?  ?Hyperlipidemia ?-Continue Lipitor ?  ?Left upper extremity edema. ?We will check Doppler to rule out DVT. ?Suspect thrombophlebitis. ? ?Subjective: No nausea or vomiting.  Significant other at the bedside reports that the patient is coughing more.  No fever no chills. ? ?Physical Exam: ?Vitals:  ? 01/14/22 0853 01/14/22 1143 01/14/22 1526 01/14/22 1648  ?BP: (!) 141/86  124/87   ?Pulse: (!) 115 (!) 110 (!) 110 (!) 111  ?Resp: (!) 25  (!) 22 (!) 24  ?Temp:  98.8 ?F (37.1 ?C) 98.5 ?F (36.9 ?C)   ?TempSrc:  Axillary Axillary   ?SpO2: 97%  97% 94%  ?Weight:      ?Height:      ? ?General: Appear in mild distress; no visible Abnormal Neck Mass Or lumps, Conjunctiva normal ?Cardiovascular: S1 and S2 Present, no Murmur, ?Respiratory: good respiratory effort, Bilateral Air entry present and bilateral rhonchi, no crackles, no wheezes ?Abdomen: Bowel Sound present, Non tender  ?Extremities: Left lower extremity edema with redness, no pedal edema ?Neurology: alert and non verbal ?Gait not checked due to patient safety concerns  ? ?Data Reviewed: ?I have Reviewed nursing notes, Vitals, and Lab results since pt's last encounter. Pertinent lab results CBC and BMP ?I have ordered test including BMP ?I have ordered imaging studies x-ray chest.  ? ?Family Communication: Significant other at bedside ? ?Disposition: ?Status is: Inpatient ?Remains inpatient appropriate because: Needing IV therapies and transfer to [facility] tomorrow. ? ?Author: ?Berle Mull, MD ?01/14/2022 7:34 PM ? ?Please look on [email or web address] to find out who is on call. ?

## 2022-01-15 ENCOUNTER — Other Ambulatory Visit (HOSPITAL_COMMUNITY): Payer: Self-pay

## 2022-01-15 ENCOUNTER — Inpatient Hospital Stay (HOSPITAL_COMMUNITY): Payer: Managed Care, Other (non HMO)

## 2022-01-15 ENCOUNTER — Inpatient Hospital Stay
Admission: AD | Admit: 2022-01-15 | Discharge: 2022-02-09 | Disposition: A | Discharge status: Rehabilitation Facility | Payer: Self-pay | Source: Other Acute Inpatient Hospital

## 2022-01-15 DIAGNOSIS — I059 Rheumatic mitral valve disease, unspecified: Secondary | ICD-10-CM | POA: Diagnosis not present

## 2022-01-15 DIAGNOSIS — R609 Edema, unspecified: Secondary | ICD-10-CM

## 2022-01-15 LAB — BASIC METABOLIC PANEL
Anion gap: 6 (ref 5–15)
BUN: 35 mg/dL — ABNORMAL HIGH (ref 8–23)
CO2: 24 mmol/L (ref 22–32)
Calcium: 7.7 mg/dL — ABNORMAL LOW (ref 8.9–10.3)
Chloride: 117 mmol/L — ABNORMAL HIGH (ref 98–111)
Creatinine, Ser: 1.18 mg/dL (ref 0.61–1.24)
GFR, Estimated: >60 mL/min (ref >60)
Glucose, Bld: 129 mg/dL — ABNORMAL HIGH (ref 70–99)
Potassium: 3.3 mmol/L — ABNORMAL LOW (ref 3.5–5.1)
Sodium: 147 mmol/L — ABNORMAL HIGH (ref 135–145)

## 2022-01-15 LAB — GLUCOSE, CAPILLARY
Glucose-Capillary: 113 mg/dL — ABNORMAL HIGH (ref 70–99)
Glucose-Capillary: 135 mg/dL — ABNORMAL HIGH (ref 70–99)
Glucose-Capillary: 146 mg/dL — ABNORMAL HIGH (ref 70–99)
Glucose-Capillary: 155 mg/dL — ABNORMAL HIGH (ref 70–99)
Glucose-Capillary: 168 mg/dL — ABNORMAL HIGH (ref 70–99)

## 2022-01-15 LAB — CBC
HCT: 33.3 % — ABNORMAL LOW (ref 39.0–52.0)
Hemoglobin: 10.4 g/dL — ABNORMAL LOW (ref 13.0–17.0)
MCH: 29.8 pg (ref 26.0–34.0)
MCHC: 31.2 g/dL (ref 30.0–36.0)
MCV: 95.4 fL (ref 80.0–100.0)
Platelets: 409 10*3/uL — ABNORMAL HIGH (ref 150–400)
RBC: 3.49 MIL/uL — ABNORMAL LOW (ref 4.22–5.81)
RDW: 15.7 % — ABNORMAL HIGH (ref 11.5–15.5)
WBC: 8.8 10*3/uL (ref 4.0–10.5)
nRBC: 0 % (ref 0.0–0.2)

## 2022-01-15 LAB — MAGNESIUM: Magnesium: 2.2 mg/dL (ref 1.7–2.4)

## 2022-01-15 IMAGING — DX DG ABDOMEN 1V
1 series · 1 of 1 positions shown · non-contrast
Comparison: Abdominal radiograph dated [DATE].

CLINICAL DATA: Peg placement.

EXAM:
ABDOMEN - 1 VIEW

[abdomen]
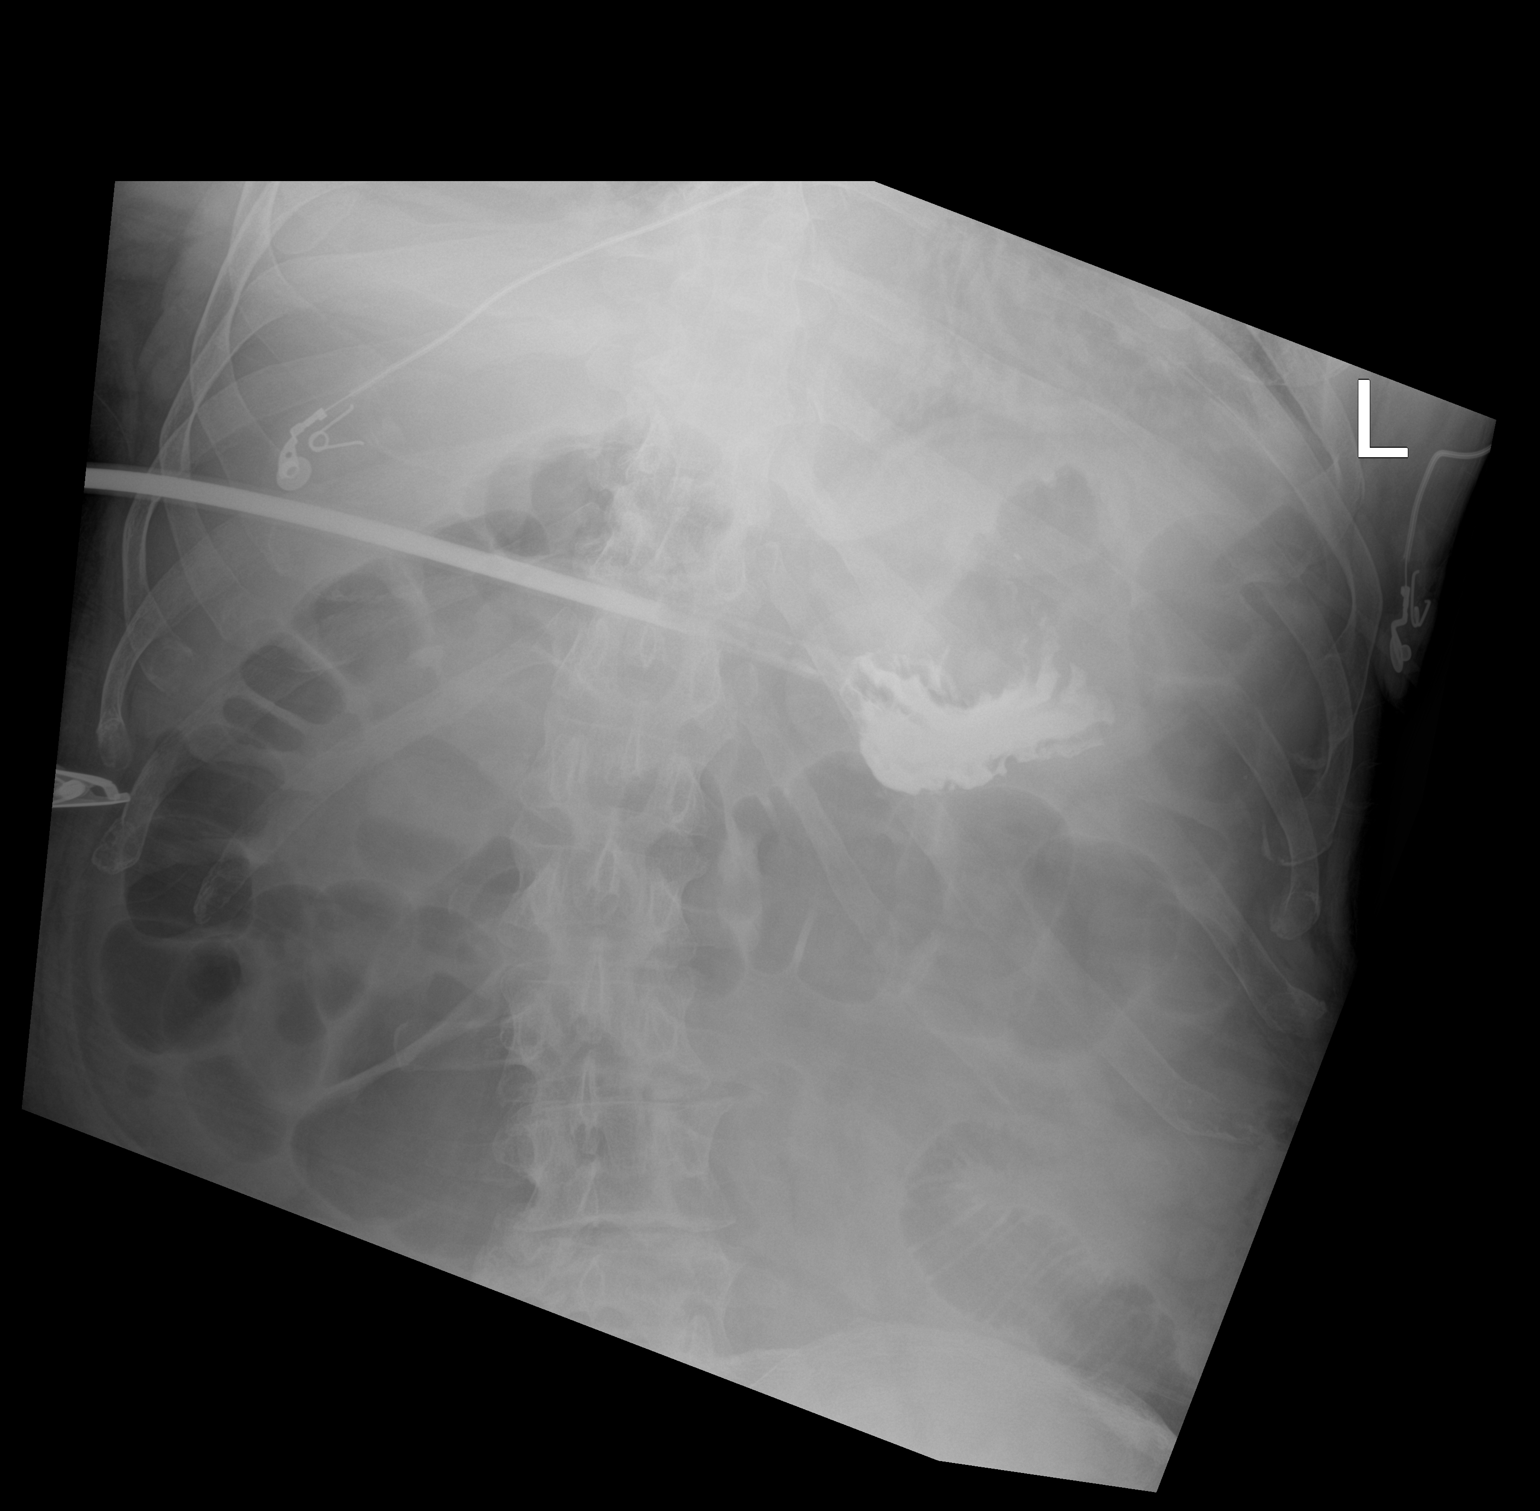

[1 of 1 positions shown; findings below may reference images not displayed]

FINDINGS: Percutaneous gastrostomy over the upper abdomen. Contrast injected
through the tube opacifies the stomach. No extraluminal contrast or
evidence of leak.
IMPRESSION: Percutaneous gastrostomy over the body of the stomach.

## 2022-01-15 IMAGING — DX DG CHEST 1V PORT
1 series · 2 of 2 positions shown · non-contrast
Comparison: AP chest [DATE] and [DATE]

CLINICAL DATA: Tracheostomy present.

EXAM:
PORTABLE CHEST 1 VIEW

[Series 1: chest · 0.14mm/px · 2 of 2 slices shown]
[im 1/2]
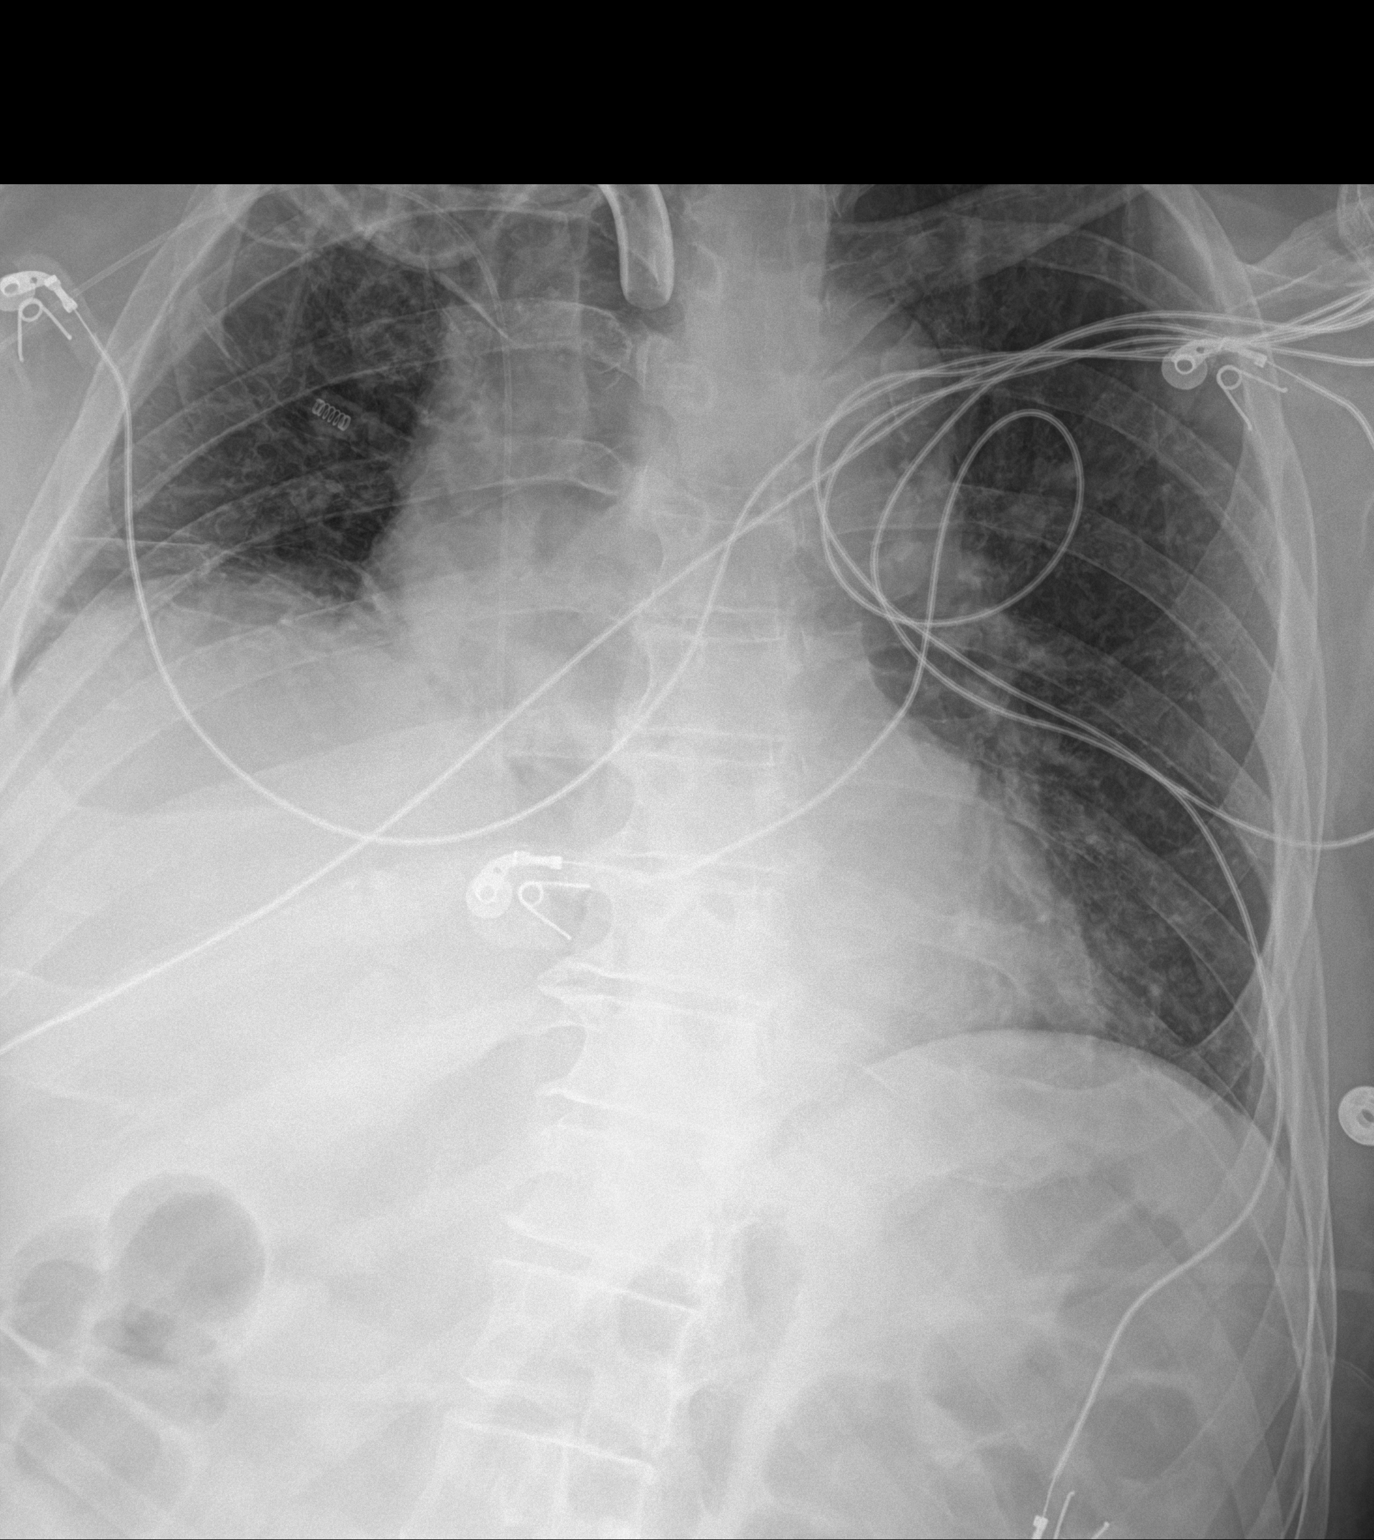
[im 2/2]
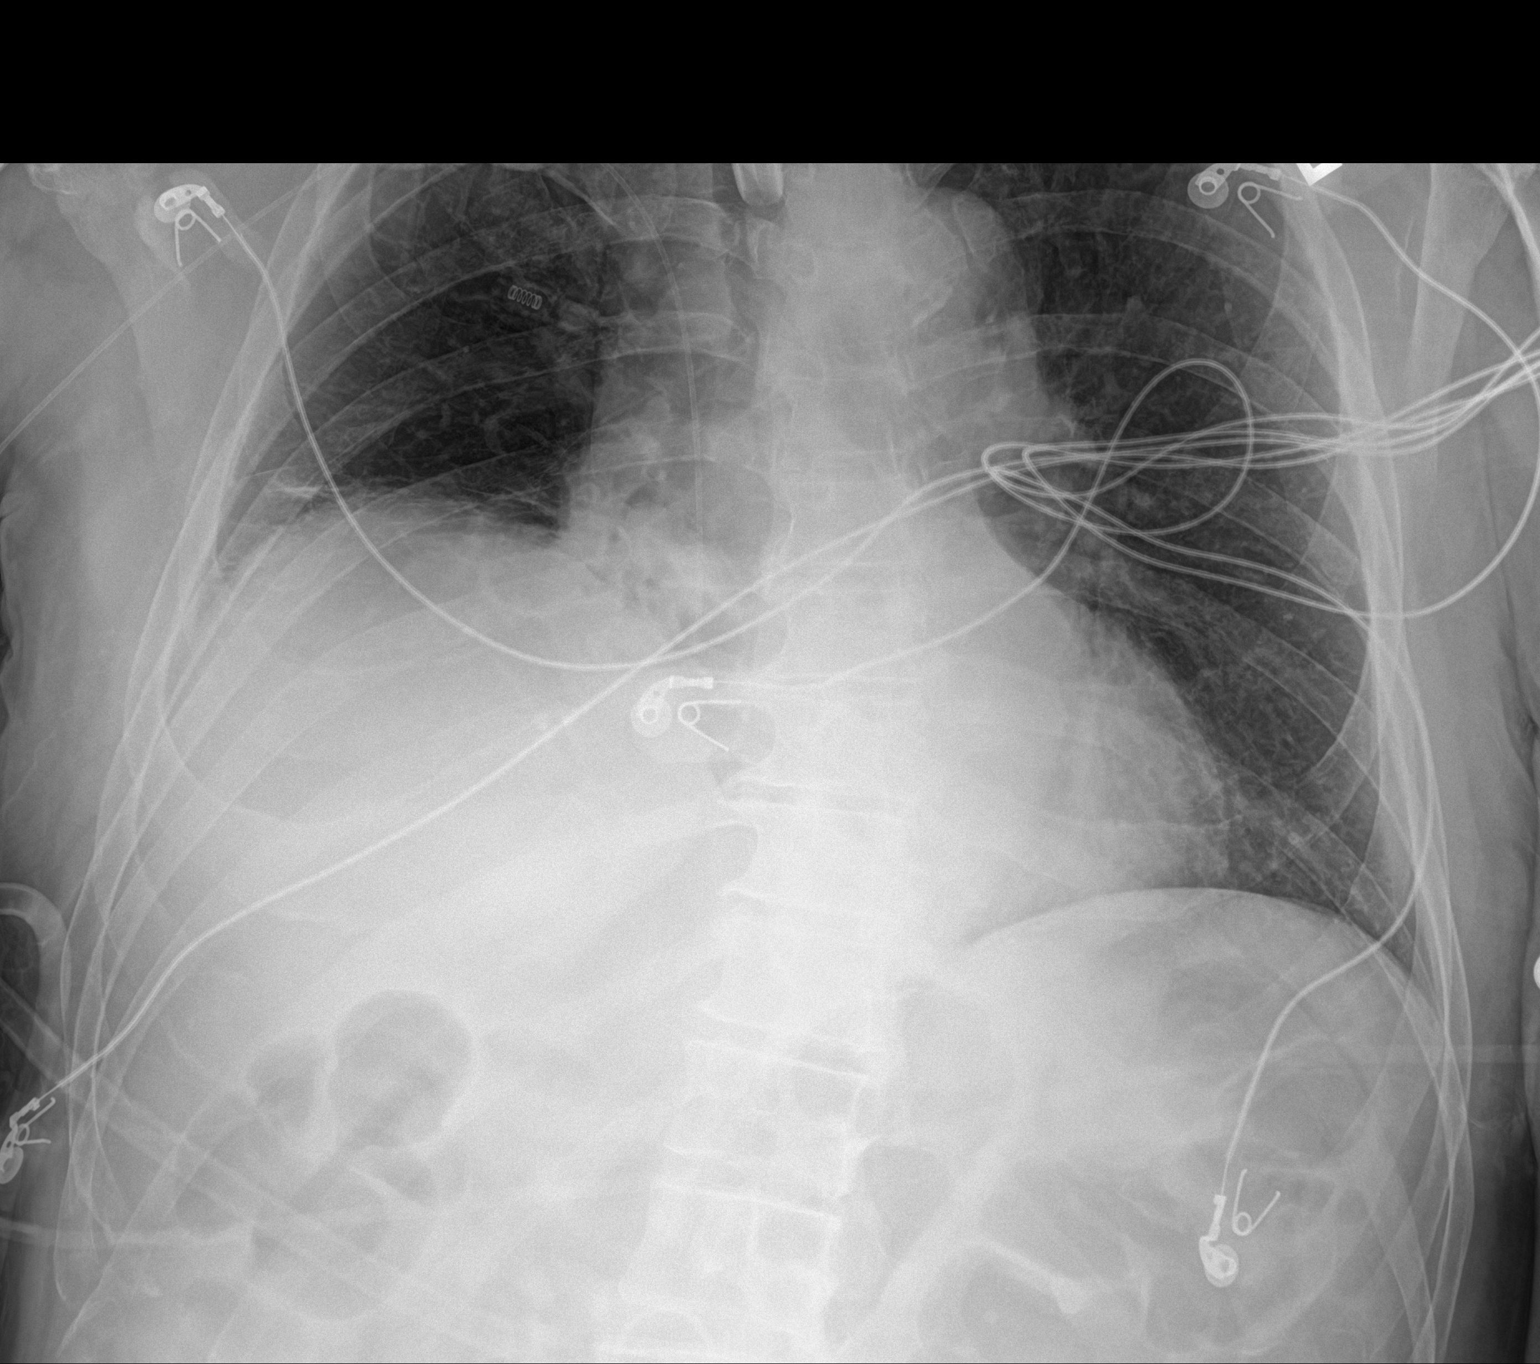

[2 of 2 positions shown; findings below may reference images not displayed]

FINDINGS: Right upper extremity PICC tip again overlies the superior aspect of
the right atrium.

Tracheostomy tube again overlies the midline trachea.

The patient is mildly rightward rotated.

The cardiac silhouette is again mildly enlarged. Mediastinal
contours are grossly within normal limits.

There is again moderate elevation of the right hemidiaphragm with
horizontal linear right-greater-than-left basilar likely
subsegmental atelectasis. No pneumothorax is seen.

Mild-to-moderate multilevel degenerative disc changes of the
thoracic spine.
IMPRESSION: Moderate elevation of the right hemidiaphragm with
right-greater-than-left basilar likely subsegmental atelectasis.

## 2022-01-15 MED ORDER — NUTRISOURCE FIBER PO PACK: 1.0000 | PACK | Freq: Two times a day (BID) | ORAL | Status: DC

## 2022-01-15 MED ORDER — PROSOURCE TF PO LIQD: 90.0000 mL | Freq: Two times a day (BID) | ORAL | Status: DC

## 2022-01-15 MED ORDER — PANTOPRAZOLE SODIUM 40 MG PO PACK: 40.0000 mg | PACK | Freq: Two times a day (BID) | ORAL | Status: AC

## 2022-01-15 MED ORDER — CHLORHEXIDINE GLUCONATE CLOTH 2 % EX PADS: 6.0000 | MEDICATED_PAD | Freq: Every day | CUTANEOUS | Status: DC

## 2022-01-15 MED ORDER — HYDROCODONE-ACETAMINOPHEN 5-325 MG PO TABS
1.0000 | ORAL_TABLET | Freq: Four times a day (QID) | ORAL | 0 refills | Status: DC | PRN
Start: 2022-01-15 — End: 2022-11-15

## 2022-01-15 MED ORDER — QUETIAPINE FUMARATE 25 MG PO TABS: 12.5000 mg | ORAL_TABLET | Freq: Every day | ORAL | Status: DC

## 2022-01-15 MED ORDER — FREE WATER: 200.0000 mL | Freq: Three times a day (TID) | Status: DC

## 2022-01-15 MED ORDER — ATORVASTATIN CALCIUM 40 MG PO TABS
40.0000 mg | ORAL_TABLET | Freq: Every day | ORAL | 0 refills | Status: AC
Start: 2022-01-15 — End: ?

## 2022-01-15 MED ORDER — CHLORHEXIDINE GLUCONATE 0.12% ORAL RINSE (MEDLINE KIT): 15.0000 mL | Freq: Two times a day (BID) | OROMUCOSAL | 0 refills | Status: DC

## 2022-01-15 MED ORDER — NAFCILLIN IV (FOR PTA / DISCHARGE USE ONLY): 12.0000 g | INTRAVENOUS | 0 refills | Status: AC

## 2022-01-15 MED ORDER — CLONAZEPAM 0.25 MG PO TBDP: 0.2500 mg | ORAL_TABLET | Freq: Two times a day (BID) | ORAL | 0 refills | Status: DC | PRN

## 2022-01-15 MED ORDER — LISINOPRIL 10 MG PO TABS
10.0000 mg | ORAL_TABLET | Freq: Every day | ORAL | Status: AC
Start: 2022-01-15 — End: ?

## 2022-01-15 MED ORDER — GUAIFENESIN 100 MG/5ML PO LIQD: 15.0000 mL | Freq: Four times a day (QID) | ORAL | 0 refills | Status: DC

## 2022-01-15 MED ORDER — JEVITY 1.5 CAL/FIBER PO LIQD: 1000.0000 mL | ORAL | Status: DC

## 2022-01-15 MED ORDER — DIATRIZOATE MEGLUMINE & SODIUM 66-10 % PO SOLN
ORAL | Status: AC
  Filled 2022-01-15: qty 30

## 2022-01-15 MED ORDER — GERHARDT'S BUTT CREAM: 1.0000 "application " | TOPICAL_CREAM | Freq: Four times a day (QID) | CUTANEOUS | Status: DC

## 2022-01-15 NOTE — Progress Notes (Signed)
Physical Therapy Treatment ?Patient Details ?Name: Vincent Ferrell ?MRN: [id number] ?DOB: Feb 10, 1961 ?Today's Date: 01/15/2022 ? ? ?History of Present Illness Pt is 61 yo male who presents with L side weakness, L facial droop, and R gaze deviation. He had been dx with bronchopneumonia on 4/11. 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 Pt required intubation on 4/14. Pt with lower GIB, colonoscopy 4/17 and upper GI endoscopy 4/18. Pt underwent Transurethral resection of the prostatic abscess on 4/18. PMH: HLD, HTN, elevated glucose.  Had desats and ETT advanced on 4/20, then underwent trach and bronch on 4/24, and PEG placed 4/25. ? ?  ?PT Comments  ? ? Pt was very motivated to help move today, and demonstrated good active participation and investment in his recovery.  Pt was instructed on there ex to practice with BLE's during time in bed, with good return on demonstration from PT.  Pt is expecting to move to a rehab setting, and will be able to continue with core strengthening and LE active movement as has done today.  Follow for acute PT goals until dc to LTAC, which is expected to occur today.   ?Recommendations for follow up therapy are one component of a multi-disciplinary discharge planning process, led by the attending physician.  Recommendations may be updated based on patient status, additional functional criteria and insurance authorization. ? ?Follow Up Recommendations ? PT at Long-term acute care hospital ?  ?  ?Assistance Recommended at Discharge Frequent or constant Supervision/Assistance  ?Patient can return home with the following Two people to help with walking and/or transfers;Two people to help with bathing/dressing/bathroom;Help with stairs or ramp for entrance;Assist for transportation;Direct supervision/assist for medications management;Direct supervision/assist for financial management;Assistance with feeding;Assistance with cooking/housework ?  ?Equipment Recommendations ?  Hospital bed (hoyer)  ?  ?Recommendations for Other Services Rehab consult ? ? ?  ?Precautions / Restrictions Precautions ?Precautions: Fall ?Precaution Comments: L hemiplegia, trach and PEG, watch HR ?Required Braces or Orthoses:  (prevalon boots) ?Restrictions ?Weight Bearing Restrictions: No  ?  ? ?Mobility ? Bed Mobility ?Overal bed mobility: Needs Assistance ?Bed Mobility: Rolling ?Rolling: +2 for physical assistance, +2 for safety/equipment, Max assist ?  ?  ?  ?  ?General bed mobility comments: assisted to move LE's and trunk to set up to roll to complete cleaning and repositioning ?  ? ?Transfers ?  ?  ?  ?  ?  ?  ?  ?  ?  ?General transfer comment: unable ?  ? ?Ambulation/Gait ?  ?  ?  ?  ?  ?  ?  ?General Gait Details: unable ? ? ?Stairs ?  ?  ?  ?  ?  ? ? ?Wheelchair Mobility ?  ? ?Modified Rankin (Stroke Patients Only) ?Modified Rankin (Stroke Patients Only) ?Pre-Morbid Rankin Score: No symptoms ?Modified Rankin: Severe disability ? ? ?  ?Balance Overall balance assessment: Needs assistance ?Sitting-balance support: Feet supported, Bilateral upper extremity supported ?Sitting balance-Leahy Scale: Zero ?  ?  ?  ?  ?  ?  ?  ?  ?  ?  ?  ?  ?  ?  ?  ?  ?  ? ?  ?Cognition Arousal/Alertness: Awake/alert ?Behavior During Therapy: Flat affect ?Overall Cognitive Status: Difficult to assess ?  ?  ?  ?  ?  ?  ?  ?  ?  ?  ?  ?  ?  ?  ?  ?  ?General Comments: Pt with R gaze preference,  follows commands, communicates via thumbs up/down gestures with RUE ?  ?  ? ?  ?Exercises General Exercises - Lower Extremity ?Ankle Circles/Pumps: AAROM, 5 reps ?Quad Sets: AROM, 10 reps ?Gluteal Sets: AROM, 10 reps ?Long Arc Quad: AAROM, 10 reps ?Heel Slides: AAROM, 10 reps ?Hip ABduction/ADduction: AAROM, 10 reps ? ?  ?General Comments General comments (skin integrity, edema, etc.): pt was assisted to move to fully upright sitting posture on the bed, was better able to assist with LE strengthening and to clear his trach with  coughing ?  ?  ? ?Pertinent Vitals/Pain Pain Assessment ?Pain Assessment: Faces ?Faces Pain Scale: Hurts little more ?Pain Location: neck with movement ?Pain Descriptors / Indicators: Grimacing, Guarding ?Pain Intervention(s): Monitored during session, Repositioned  ? ? ?Home Living   ?  ?  ?  ?  ?  ?  ?  ?  ?  ?   ?  ?Prior Function    ?  ?  ?   ? ?PT Goals (current goals can now be found in the care plan section) Acute Rehab PT Goals ?Patient Stated Goal: non verbal, nod for yes or no ?Progress towards PT goals: Progressing toward goals ? ?  ?Frequency ? ? ? Min 3X/week ? ? ? ?  ?PT Plan Current plan remains appropriate  ? ? ?Co-evaluation   ?  ?  ?  ?  ? ?  ?AM-PAC PT "6 Clicks" Mobility   ?Outcome Measure ? Help needed turning from your back to your side while in a flat bed without using bedrails?: Total ?Help needed moving from lying on your back to sitting on the side of a flat bed without using bedrails?: Total ?Help needed moving to and from a bed to a chair (including a wheelchair)?: Total ?Help needed standing up from a chair using your arms (e.g., wheelchair or bedside chair)?: Total ?Help needed to walk in hospital room?: Total ?Help needed climbing 3-5 steps with a railing? : Total ?6 Click Score: 6 ? ?  ?End of Session   ?Activity Tolerance: Patient limited by fatigue ?Patient left: in bed;with call bell/phone within reach;with bed alarm set;with nursing/sitter in room ?Nurse Communication: Mobility status ?PT Visit Diagnosis: Other abnormalities of gait and mobility (R26.89);Muscle weakness (generalized) (M62.81);Other symptoms and signs involving the nervous system (R29.898);Hemiplegia and hemiparesis ?Hemiplegia - Right/Left: Left ?Hemiplegia - dominant/non-dominant: Non-dominant ?Hemiplegia - caused by: Cerebral infarction ?  ? ? ?Time: 7673-4193 ?PT Time Calculation (min) (ACUTE ONLY): 34 min ? ?Charges:  $Therapeutic Exercise: 8-22 mins ?$Therapeutic Activity: 8-22 mins  ?Ivar Drape ?01/15/2022,  12:50 PM ? ?Samul Dada, PT PhD ?Acute Rehab Dept. Number: [facility] [phone] and [facility] [phone] ? ? ?

## 2022-01-15 NOTE — TOC Transition Note (Signed)
Transition of Care (TOC) - CM/SW Discharge Note ? ? ?Patient Details  ?Name: Aadhav Uhlig ?MRN: [id number] ?Date of Birth: 1961-07-10 ? ?Transition of Care (TOC) CM/SW Contact:  ?Kermit Balo, RN ?Phone Number: ?01/15/2022, 10:02 AM ? ? ?Clinical Narrative:    ?Patient is discharging to [facility] on the 5th floor today. Sister has been updated. ? ?Room: 5E6 ?Number for report: [phone] ? ? ?Final next level of care: Long Term Acute Care (LTAC) ?Barriers to Discharge: No Barriers Identified ? ? ?Patient Goals and CMS Choice ?  ?CMS Medicare.gov Compare Post Acute Care list provided to:: Patient Represenative (must comment) ?Choice offered to / list presented to : Sibling ? ?Discharge Placement ?  ?           ?  ?  ?  ?  ? ?Discharge Plan and Services ?In-house Referral: Clinical Social Work ?  ?Post Acute Care Choice: Skilled Nursing Facility          ?  ?  ?  ?  ?  ?  ?  ?  ?  ?  ? ?Social Determinants of Health (SDOH) Interventions ?  ? ? ?Readmission Risk Interventions ?   ? View : No data to display.  ?  ?  ?  ? ? ? ? ? ?

## 2022-01-15 NOTE — Progress Notes (Signed)
Nsg Discharge Note ? ?Admit Date:  12/25/2021 ?Discharge date: 01/15/2022 ?  ?Nelta Numbers to be D/C'd to  [facility]  per MD order.  AVS completed.  Patient/caregiver able to verbalize understanding. Report given to [facility] RN. Patient transferred in stable condition and all belongings at bedside. IV's intact, foley cleaned and emptied and PEG tube infusing Jevity 1.5. ? ?Discharge Medication: ?Allergies as of 01/15/2022   ? ?   Reactions  ? Pollen Extract Other (See Comments)  ? Sneezing   ? ?  ? ?  ?Medication List  ?  ? ?STOP taking these medications   ? ?aspirin EC 81 MG tablet ?  ?benzonatate 100 MG capsule ?Commonly known as: TESSALON ?  ?doxycycline 100 MG capsule ?Commonly known as: VIBRAMYCIN ?  ?loratadine 10 MG tablet ?Commonly known as: CLARITIN ?  ?Omega-3 1000 MG Caps ?  ?pantoprazole 20 MG tablet ?Commonly known as: PROTONIX ?Replaced by: pantoprazole sodium 40 mg ?  ?simvastatin 40 MG tablet ?Commonly known as: ZOCOR ?  ? ?  ? ?TAKE these medications   ? ?atorvastatin 40 MG tablet ?Commonly known as: LIPITOR ?Place 1 tablet (40 mg total) into feeding tube daily. ?  ?chlorhexidine gluconate (MEDLINE KIT) 0.12 % solution ?Commonly known as: PERIDEX ?15 mLs by Mouth Rinse route 2 (two) times daily. ?  ?Chlorhexidine Gluconate Cloth 2 % Pads ?Apply 6 each topically daily. ?  ?clonazePAM 0.25 MG disintegrating tablet ?Commonly known as: KLONOPIN ?Place 1 tablet (0.25 mg total) into feeding tube 2 (two) times daily as needed (anxiety). ?  ?feeding supplement (JEVITY 1.5 CAL/FIBER) Liqd ?Place 1,000 mLs into feeding tube continuous. ?  ?feeding supplement (PROSource TF) liquid ?Place 90 mLs into feeding tube 2 (two) times daily. ?  ?fiber Pack packet ?Place 1 packet into feeding tube 2 (two) times daily. ?  ?free water Soln ?Place 200 mLs into feeding tube every 8 (eight) hours. ?  ?Gerhardt's butt cream Crea ?Apply 1 application. topically 4 (four) times daily. ?  ?guaiFENesin 100 MG/5ML liquid ?Commonly  known as: ROBITUSSIN ?Place 15 mLs into feeding tube every 6 (six) hours. ?  ?HYDROcodone-acetaminophen 5-325 MG tablet ?Commonly known as: NORCO/VICODIN ?Place 1-2 tablets into feeding tube every 6 (six) hours as needed for moderate pain or severe pain. ?  ?lisinopril 10 MG tablet ?Commonly known as: ZESTRIL ?Place 1 tablet (10 mg total) into feeding tube daily. ?What changed:  ?medication strength ?how much to take ?how to take this ?  ?nafcillin  IVPB ?Inject 12 g into the vein daily. As a continuous infusion  ?Indication:  MSSA bacteremia/endocarditis of the mitral valve ?First Dose: Yes ?Last Day of Therapy:  02/12/22 ?Labs - Once weekly:  CBC/D and BMP, ?Labs - Every other week:  ESR and CRP ?  ?pantoprazole sodium 40 mg ?Commonly known as: PROTONIX ?Place 40 mg into feeding tube 2 (two) times daily. ?Replaces: pantoprazole 20 MG tablet ?  ?QUEtiapine 25 MG tablet ?Commonly known as: SEROQUEL ?0.5 tablets (12.5 mg total) by Per NG tube route at bedtime. ?  ? ?  ? ?  ?  ? ? ?  ?Home Infusion Instuctions  ?(From admission, onward)  ?  ? ? ?  ? ?  Start     Ordered  ? 01/15/22 0000  Home infusion instructions       ?Question:  Instructions  Answer:  Flushing of vascular access device: 0.9% NaCl pre/post medication administration and prn patency; Heparin 100 u/ml, 4m for implanted ports  and Heparin 10u/ml, 76m for all other central venous catheters.  ? 01/15/22 0746  ? ?  ?  ? ?  ? ? ?Discharge Assessment: ?Vitals:  ? 01/15/22 1128 01/15/22 1200  ?BP:    ?Pulse: (!) 120   ?Resp:    ?Temp:  100.3 ?F (37.9 ?C)  ?SpO2:    ? Skin clean, dry and intact without evidence of skin break down, no evidence of skin tears noted. ?IV catheter discontinued intact. Site without signs and symptoms of complications - no redness or edema noted at insertion site, patient denies c/o pain - only slight tenderness at site.  Dressing with slight pressure applied. ? ?D/c Instructions-Education: ?Discharge instructions given to  patient/family with verbalized understanding. ?D/c education completed with patient/family including follow up instructions, medication list, d/c activities limitations if indicated, with other d/c instructions as indicated by MD -  ?Patient instructed to return to ED, call 911, or call MD for any changes in condition. Transfer completed. ? ? ?REliot Ford RN ?01/15/2022 1:37 PM  ?

## 2022-01-15 NOTE — Discharge Summary (Addendum)
?Physician Discharge Summary ?  ?Patient: Vincent Ferrell MRN: [id number] DOB: 1960-10-22  ?Admit date:     12/25/2021  ?Discharge date: 01/15/2022   ?Discharge Physician: Berle Mull  ?PCP: Houston Siren., MD ? ?Recommendations at discharge: ?Follow up with Urology in 1 week to consider appropriateness of initiating voiding trial ?Follow up with ID as recommended currently on 6/1 ?Follow up with neuro as recommended ? ?Discharge Diagnoses: ?Principal Problem: ?  Endocarditis of mitral valve ?Active Problems: ?  Acute ischemic right MCA stroke ([place]) ?  Sinus tachycardia ?  Acute respiratory failure with hypoxia ([place]) ?  Prostate abscess ?  Acute kidney injury ([place]) ?  Anemia due to GI blood loss ?  Pericardial effusion ?  Encephalopathy acute ?  Severe protein-calorie malnutrition ([place]) ?  Physical deconditioning ?  Diarrhea ? ?Hospital Course: ?Vincent Ferrell is a 61 y/o gentleman with a history of HTN, HLD, GERD who presented with stroke symptoms, found to have MSSA bacteremia and endocarditis causing embolic strokes and prostate abscess.   ?4/14 Admitted with acute R MCA stroke s/p mechanical thrombectomy with revascularization.  Cards consulted for initial code STEMI. Required 4 units of pRBCs for lower GI bleed.  ?4/17: Large bloody BM. Bedside colonoscopy with blood throughout colon. TEE with 1 cm x 0.5 cm vegetation involving both atrial and ventricular sides. Hemoglobin decreased to 6.8. Additional 2 units of pRBCs and FFP given.  ?4/18: Bedside EGD and colonoscopy with evidence of black blood but no source of bleeding identified. MRI of the prostate demonstrated large abscess. Patient taken for drainage by Urology. Cultures obtained.  ?4/20: TCTS consulted for concern of valvular insufficiency. Repeat echo with increased peri-cardial effusion size.   ?4/23 MRI brian with expected stroke evolution. ?4/24 trach ?4/25 PEG, repeat Echocardiogram shows improved pericardial effusion. ? ?Assessment and  Plan: ?Infective endocarditis of mitral valve ?MSSA bacteria from prostate abscess is presumed source.  ?Associated brain embolic infarcts with concern for septic emboli. Patient has been managed on Nafcillin. CT surgery consulted on 4/20 with recommendation for no surgical management. ?-Continue PICC line ?-Continue nafcillin, monitor renal function; end date of 02/12/2022 ?  ?Acute ischemic right MCA stroke ?Presumed secondary embolic etiology from endocarditis.  ?MRI (4/15) confirms acute moderate right MCA/punctate left cerebellum infarcts in addition to subacute small right occipital and left MCA infarcts.  ?Patient underwent mechanical thrombectomy with complete revascularization on 4/14 by neurointerventional radiology.  ?CTA head and neck significant for right M2 occlusion.  ?LDL of 37.7. Hemoglobin A1C of 6.0%.  ?Neurology recommendations for no antithrombotic therapy.  ?Neurology follow-up in 4 weeks. ?  ?Prostate abscess ?Urology consulted.  ?Transurethral resection performed on 4/18.  ?Urine culture (4/18) from surgery was significant for MSSA.  ?Urology with recommendation to keep foley catheter in for 2 weeks starting 4/18 with voiding trial in the clinic.  ?Recommendation for catheter exchange over a wire if needed vs coud? catheter pointed towards the right. Catheter remains in place for now. ?  ?Acute respiratory failure with hypoxia ?Patient required intubation and mechanical ventilation from 4/14 until 4/24; tracheostomy performed on 4/24. ?-PCCM following for tracheostomy care ?-Continue oxygen supplementation ?  ?AKI ?Creatinine of 1.9 on admission with peak of 2.33. Now resolved. ?  ?Pericardial effusion ?Small. Noted on initial Transthoracic Echocardiogram (4/15) with worsening effusion noted subsequent limited Transthoracic Echocardiogram (4/20). Repeat limited Transthoracic Echocardiogram (4/25) significant for improved effusion. No tamponade physiciology ?  ?Inadequate oral intake ?Secondary  to stroke/dysphagia. ?PEG tube placed on 4/25  by general surgery and tube feeds initiated. ?  ?Acute blood loss Anemia ?Acute GI hemorrhage ?Patient presented with a hemoglobin of 11.9.  Trended down to 6.5. ?GI consulted on 4/14. Initial colonoscopy performed on 4/17 with upper endoscopy and repeat colonoscopy performed on 4/18. Upper endoscopy and repeat colonoscopy significant for no identifiable source for bleeding; presumed small bowel etiology.  ?GI recommendations for continued serial hemoglobins, transfusion as needed and CT angio abdomen stat if recurrent active bleeding.  ?Patient required 4 units of pRBC, 1 unit of platelets and 1 unit of FFP to date.  ?Hemoglobin now 9-10 and stable. No recurrent GI bleeding noted. ?Continue PPI BID ?  ?Multifocal pneumonia ?Treated with CAP coverage for 5 days. ?  ?Primary hypertension ?Continue lisinopril ?  ?Diarrhea- resolved. ?In setting of tube feeds. ?  ?Hyperlipidemia ?-Continue Lipitor ?  ?Left upper extremity edema. ?Age indeterminate SVT  ?Doppler rule out DVT. ?SVT seen. No change in therapy recommended  ? ?Consultants: Cardiology  ?Cardiothoracic surgery  ?Gastroenterology  ?ID ?General surgery  ?Urology  ?Primary admission with PCCM  ? ?Procedures performed:  ?ETT (4/14 >> 4/21, 4/21 >> 4/24) ?Colonoscopy (4/17) ?Colonoscopy/Upper endoscopy (4/18) ?Cortrak placement (4/21) ?Tracheostomy by PCCM (4/24>> ?esophagogastroduodenoscopy (EGD) and percutaneous endoscopic gastrostomy (PEG) tube placement 4/25 ? ?DISCHARGE MEDICATION: ?Allergies as of 01/15/2022   ? ?   Reactions  ? Pollen Extract Other (See Comments)  ? Sneezing   ? ?  ? ?  ?Medication List  ?  ? ?STOP taking these medications   ? ?aspirin EC 81 MG tablet ?  ?benzonatate 100 MG capsule ?Commonly known as: TESSALON ?  ?doxycycline 100 MG capsule ?Commonly known as: VIBRAMYCIN ?  ?loratadine 10 MG tablet ?Commonly known as: CLARITIN ?  ?Omega-3 1000 MG Caps ?  ?pantoprazole 20 MG tablet ?Commonly known  as: PROTONIX ?Replaced by: pantoprazole sodium 40 mg ?  ?simvastatin 40 MG tablet ?Commonly known as: ZOCOR ?  ? ?  ? ?TAKE these medications   ? ?atorvastatin 40 MG tablet ?Commonly known as: LIPITOR ?Place 1 tablet (40 mg total) into feeding tube daily. ?  ?chlorhexidine gluconate (MEDLINE KIT) 0.12 % solution ?Commonly known as: PERIDEX ?15 mLs by Mouth Rinse route 2 (two) times daily. ?  ?Chlorhexidine Gluconate Cloth 2 % Pads ?Apply 6 each topically daily. ?  ?clonazePAM 0.25 MG disintegrating tablet ?Commonly known as: KLONOPIN ?Place 1 tablet (0.25 mg total) into feeding tube 2 (two) times daily as needed (anxiety). ?  ?feeding supplement (JEVITY 1.5 CAL/FIBER) Liqd ?Place 1,000 mLs into feeding tube continuous. ?  ?feeding supplement (PROSource TF) liquid ?Place 90 mLs into feeding tube 2 (two) times daily. ?  ?fiber Pack packet ?Place 1 packet into feeding tube 2 (two) times daily. ?  ?free water Soln ?Place 200 mLs into feeding tube every 8 (eight) hours. ?  ?Gerhardt's butt cream Crea ?Apply 1 application. topically 4 (four) times daily. ?  ?guaiFENesin 100 MG/5ML liquid ?Commonly known as: ROBITUSSIN ?Place 15 mLs into feeding tube every 6 (six) hours. ?  ?HYDROcodone-acetaminophen 5-325 MG tablet ?Commonly known as: NORCO/VICODIN ?Place 1-2 tablets into feeding tube every 6 (six) hours as needed for moderate pain or severe pain. ?  ?lisinopril 10 MG tablet ?Commonly known as: ZESTRIL ?Place 1 tablet (10 mg total) into feeding tube daily. ?What changed:  ?medication strength ?how much to take ?how to take this ?  ?nafcillin  IVPB ?Inject 12 g into the vein daily. As a continuous infusion  ?Indication:  MSSA bacteremia/endocarditis of the mitral valve ?First Dose: Yes ?Last Day of Therapy:  02/12/22 ?Labs - Once weekly:  CBC/D and BMP, ?Labs - Every other week:  ESR and CRP ?  ?pantoprazole sodium 40 mg ?Commonly known as: PROTONIX ?Place 40 mg into feeding tube 2 (two) times daily. ?Replaces: pantoprazole  20 MG tablet ?  ?QUEtiapine 25 MG tablet ?Commonly known as: SEROQUEL ?0.5 tablets (12.5 mg total) by Per NG tube route at bedtime. ?  ? ?  ? ?  ?  ? ? ?  ?Home Infusion Instuctions  ?(From admission, onward)

## 2022-01-15 NOTE — Progress Notes (Signed)
Upper extremity venous has been completed.  ? ?Preliminary results in CV Proc.  ? ?Vincent Ferrell ?01/15/2022 9:33 AM    ?

## 2022-01-16 LAB — BASIC METABOLIC PANEL
Anion gap: 10 (ref 5–15)
BUN: 33 mg/dL — ABNORMAL HIGH (ref 8–23)
CO2: 25 mmol/L (ref 22–32)
Calcium: 9.1 mg/dL (ref 8.9–10.3)
Chloride: 112 mmol/L — ABNORMAL HIGH (ref 98–111)
Creatinine, Ser: 1.32 mg/dL — ABNORMAL HIGH (ref 0.61–1.24)
GFR, Estimated: >60 mL/min (ref >60)
Glucose, Bld: 146 mg/dL — ABNORMAL HIGH (ref 70–99)
Potassium: 3.9 mmol/L (ref 3.5–5.1)
Sodium: 147 mmol/L — ABNORMAL HIGH (ref 135–145)

## 2022-01-16 LAB — CBC
HCT: 38 % — ABNORMAL LOW (ref 39.0–52.0)
Hemoglobin: 11.5 g/dL — ABNORMAL LOW (ref 13.0–17.0)
MCH: 29 pg (ref 26.0–34.0)
MCHC: 30.3 g/dL (ref 30.0–36.0)
MCV: 96 fL (ref 80.0–100.0)
Platelets: 409 10*3/uL — ABNORMAL HIGH (ref 150–400)
RBC: 3.96 MIL/uL — ABNORMAL LOW (ref 4.22–5.81)
RDW: 16 % — ABNORMAL HIGH (ref 11.5–15.5)
WBC: 9 10*3/uL (ref 4.0–10.5)
nRBC: 0 % (ref 0.0–0.2)

## 2022-01-16 LAB — C-REACTIVE PROTEIN: CRP: 8.7 mg/dL — ABNORMAL HIGH (ref <1.0)

## 2022-01-16 LAB — TSH: TSH: 3.362 u[IU]/mL (ref 0.350–4.500)

## 2022-01-16 LAB — T4, FREE: Free T4: 0.87 ng/dL (ref 0.61–1.12)

## 2022-01-17 ENCOUNTER — Other Ambulatory Visit (HOSPITAL_COMMUNITY): Payer: Self-pay

## 2022-01-17 LAB — URINALYSIS, ROUTINE W REFLEX MICROSCOPIC
Bilirubin Urine: NEGATIVE
Glucose, UA: NEGATIVE mg/dL
Hgb urine dipstick: NEGATIVE
Ketones, ur: NEGATIVE mg/dL
Leukocytes,Ua: NEGATIVE
Nitrite: NEGATIVE
Protein, ur: NEGATIVE mg/dL
Specific Gravity, Urine: 1.013 (ref 1.005–1.030)
pH: 5 (ref 5.0–8.0)

## 2022-01-17 LAB — BASIC METABOLIC PANEL
Anion gap: 11 (ref 5–15)
BUN: 40 mg/dL — ABNORMAL HIGH (ref 8–23)
CO2: 24 mmol/L (ref 22–32)
Calcium: 9.1 mg/dL (ref 8.9–10.3)
Chloride: 114 mmol/L — ABNORMAL HIGH (ref 98–111)
Creatinine, Ser: 1.62 mg/dL — ABNORMAL HIGH (ref 0.61–1.24)
GFR, Estimated: 48 mL/min — ABNORMAL LOW (ref >60)
Glucose, Bld: 136 mg/dL — ABNORMAL HIGH (ref 70–99)
Potassium: 4 mmol/L (ref 3.5–5.1)
Sodium: 149 mmol/L — ABNORMAL HIGH (ref 135–145)

## 2022-01-17 LAB — C-REACTIVE PROTEIN: CRP: 11.4 mg/dL — ABNORMAL HIGH (ref <1.0)

## 2022-01-17 IMAGING — US US RENAL
1 series · 14 of 20 positions shown · non-contrast
Comparison: CT [DATE]

CLINICAL DATA: Acute renal failure

EXAM:
RENAL / URINARY TRACT ULTRASOUND COMPLETE

[Series 1: us renal · 14 of 20 slices shown]
[im 1/20]
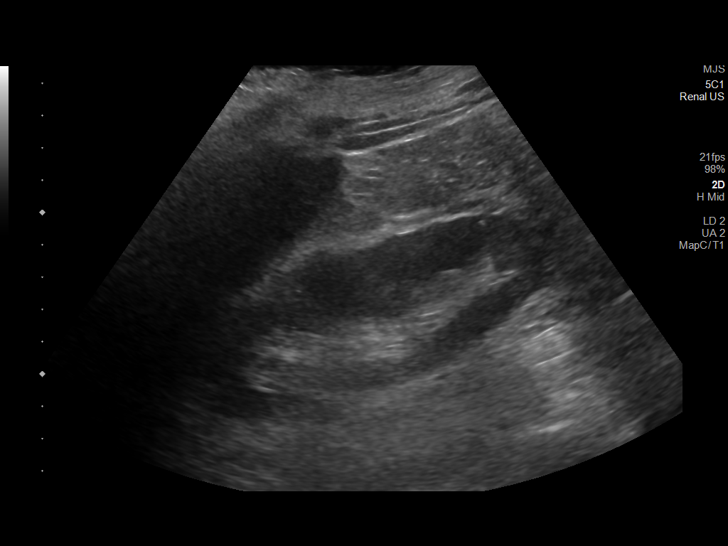
[im 3/20]
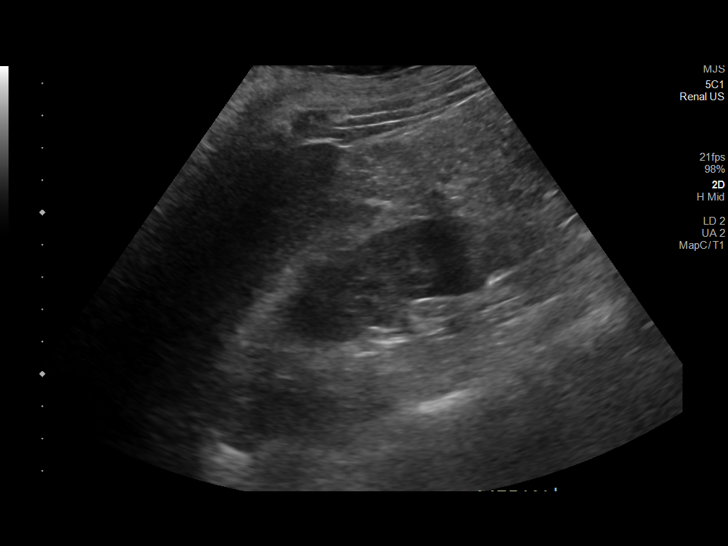
[im 4/20]
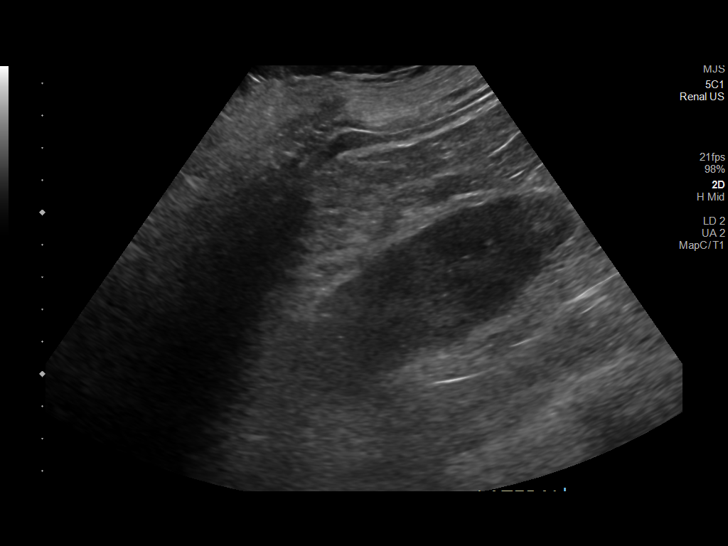
[im 6/20]
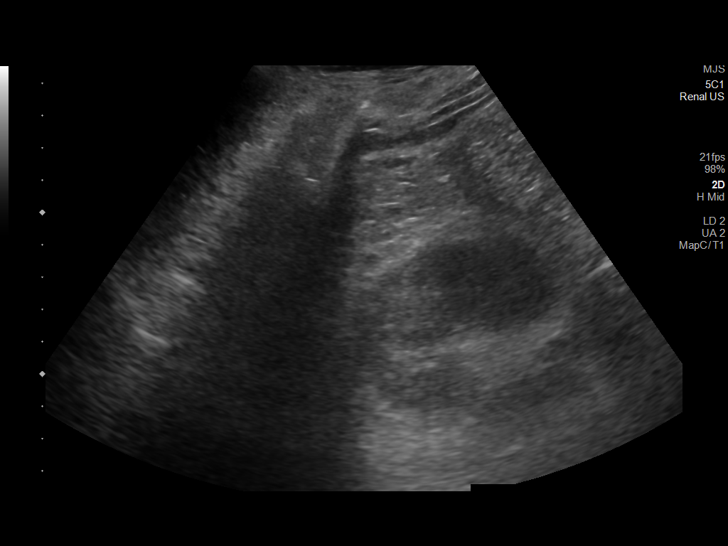
[im 7/20]
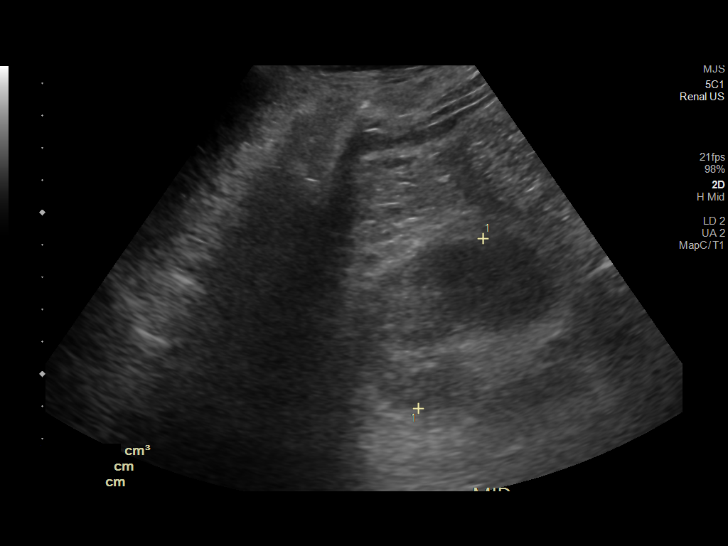
[im 8/20]
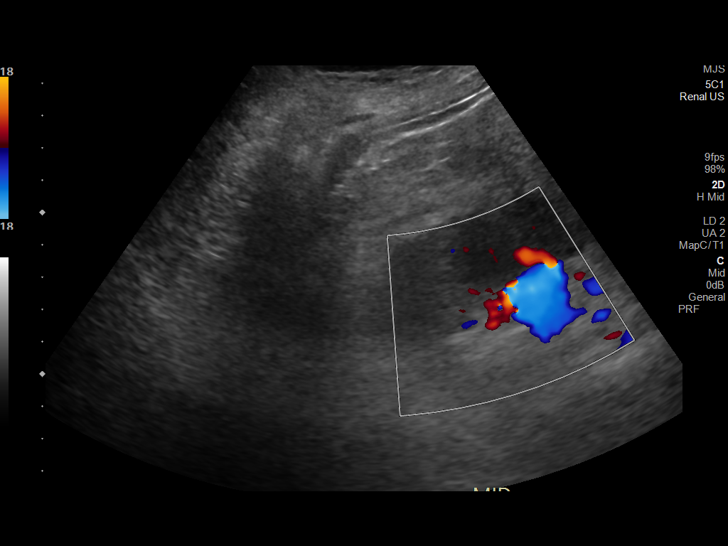
[im 10/20]
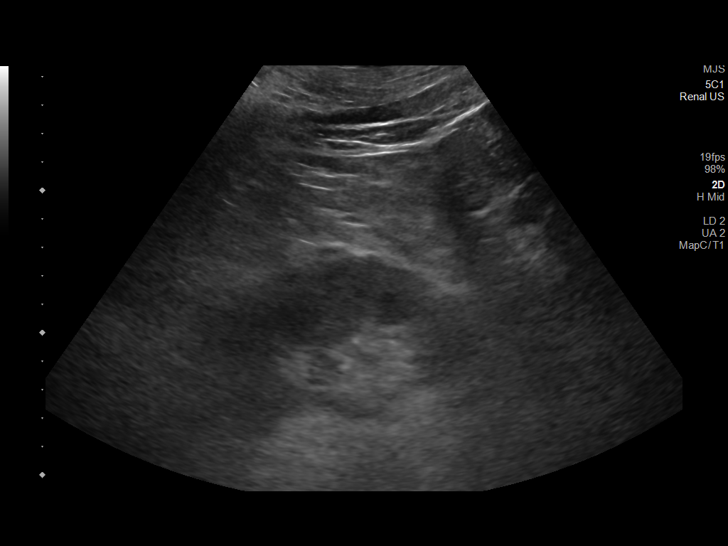
[im 11/20]
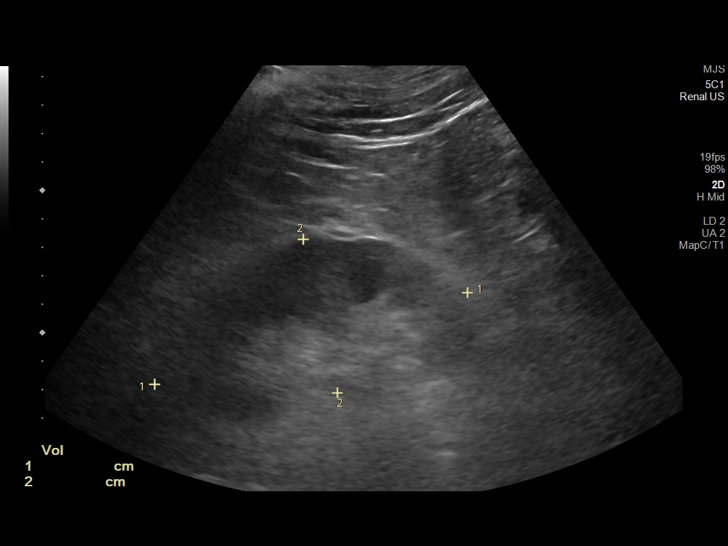
[im 13/20]
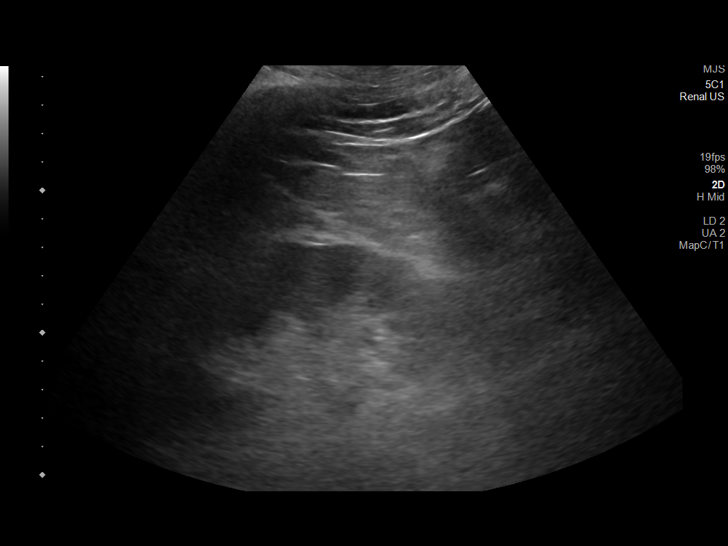
[im 14/20]
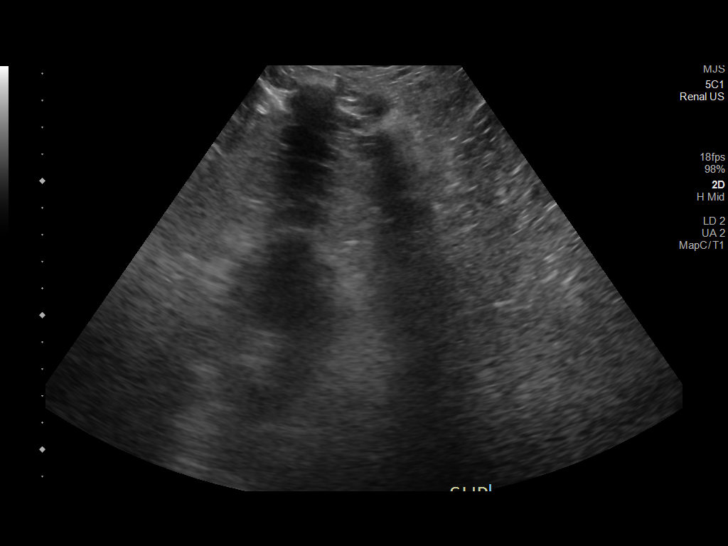
[im 16/20]
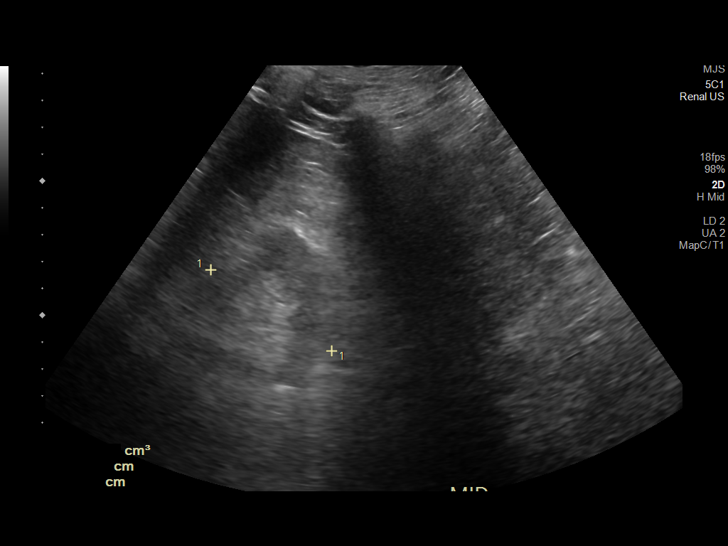
[im 17/20]
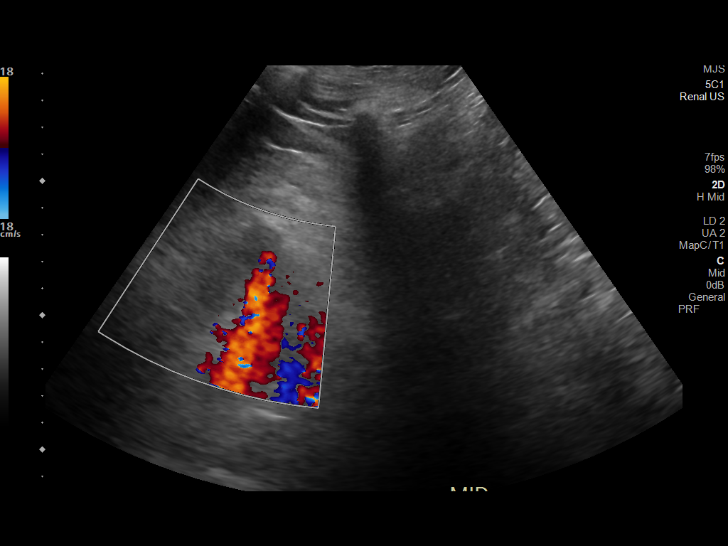
[im 18/20]
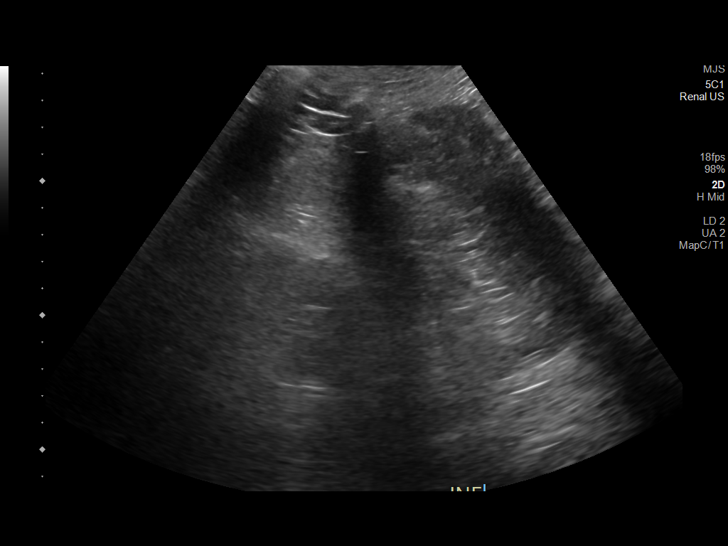
[im 20/20]
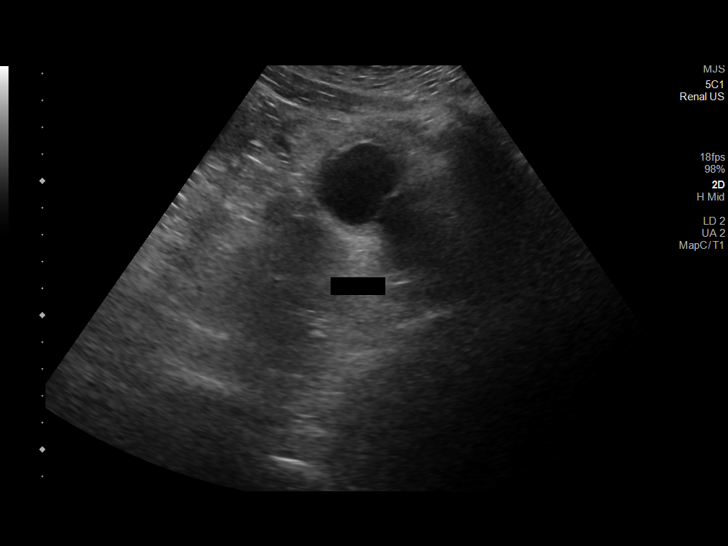

[14 of 20 positions shown; findings below may reference images not displayed]

FINDINGS: Right Kidney:

Renal measurements: 12.3 x 4.4 x 5.6 cm = volume: 158 mL.
Echogenicity within normal limits. No mass or hydronephrosis
visualized.

Left Kidney:

Renal measurements: 11.5 x 5.5 x 5.4 cm = volume: 180 mL.
Echogenicity within normal limits. No mass or hydronephrosis
visualized.

Bladder:

Decompressed by Foley catheter.

Other:

None.
IMPRESSION: No evidence of obstructive uropathy.

## 2022-01-18 LAB — URINE CULTURE: Culture: NO GROWTH

## 2022-01-18 LAB — COMPREHENSIVE METABOLIC PANEL
ALT: 25 U/L (ref 0–44)
AST: 30 U/L (ref 15–41)
Albumin: 2.1 g/dL — ABNORMAL LOW (ref 3.5–5.0)
Alkaline Phosphatase: 69 U/L (ref 38–126)
Anion gap: 5 (ref 5–15)
BUN: 37 mg/dL — ABNORMAL HIGH (ref 8–23)
CO2: 26 mmol/L (ref 22–32)
Calcium: 8.5 mg/dL — ABNORMAL LOW (ref 8.9–10.3)
Chloride: 116 mmol/L — ABNORMAL HIGH (ref 98–111)
Creatinine, Ser: 1.43 mg/dL — ABNORMAL HIGH (ref 0.61–1.24)
GFR, Estimated: 56 mL/min — ABNORMAL LOW (ref >60)
Glucose, Bld: 153 mg/dL — ABNORMAL HIGH (ref 70–99)
Potassium: 3.4 mmol/L — ABNORMAL LOW (ref 3.5–5.1)
Sodium: 147 mmol/L — ABNORMAL HIGH (ref 135–145)
Total Bilirubin: 1.4 mg/dL — ABNORMAL HIGH (ref 0.3–1.2)
Total Protein: 7 g/dL (ref 6.5–8.1)

## 2022-01-18 LAB — MAGNESIUM: Magnesium: 2.5 mg/dL — ABNORMAL HIGH (ref 1.7–2.4)

## 2022-01-18 LAB — CBC
HCT: 35.3 % — ABNORMAL LOW (ref 39.0–52.0)
Hemoglobin: 10.7 g/dL — ABNORMAL LOW (ref 13.0–17.0)
MCH: 29.2 pg (ref 26.0–34.0)
MCHC: 30.3 g/dL (ref 30.0–36.0)
MCV: 96.2 fL (ref 80.0–100.0)
Platelets: 335 10*3/uL (ref 150–400)
RBC: 3.67 MIL/uL — ABNORMAL LOW (ref 4.22–5.81)
RDW: 16.5 % — ABNORMAL HIGH (ref 11.5–15.5)
WBC: 11 10*3/uL — ABNORMAL HIGH (ref 4.0–10.5)
nRBC: 0 % (ref 0.0–0.2)

## 2022-01-18 LAB — CULTURE, RESPIRATORY W GRAM STAIN: Culture: NORMAL

## 2022-01-18 LAB — T3, FREE: T3, Free: 1.6 pg/mL — ABNORMAL LOW (ref 2.0–4.4)

## 2022-01-18 LAB — C-REACTIVE PROTEIN: CRP: 18.6 mg/dL — ABNORMAL HIGH (ref <1.0)

## 2022-01-18 LAB — CYTOLOGY - NON PAP

## 2022-01-18 NOTE — Progress Notes (Incomplete)
Pulmonary Critical Care Medicine ?[facility] [place] ?  ?PULMONARY CRITICAL CARE SERVICE ? ?PROGRESS NOTE ? ? ? ? ?Vincent Ferrell  ?ELF:[id number]  ?DOB: 11-Nov-1960  ? ?DOA: 01/15/2022 ? ?Referring Physician: Luna Kitchens, MD ? ?HPI: Vincent Ferrell is a 61 y.o. male being followed for ventilator/airway/oxygen weaning Acute on Chronic Respiratory Failure. Patient seen lying in bed this AM. His trach apparently was accidentally dislodged overnight, it remains out and he is currently on 2L [place] with stoma bandage in place. He is currently receiving a nebulizer treatment. No acute distress.  ? ?Medications: ?Reviewed on Rounds ? ?Physical Exam: ? ?Vitals: temp 98.4, pulse 111, respirations 28, bp 162/94, sp02 94% ? ?Ventilator Settings 2L [place] ? ?General: Comfortable at this time ?Neck: supple ?Cardiovascular: no malignant arrhythmias ?Respiratory: bilaterally clear, diminished right lung ?Skin: no rash seen on limited exam ?Musculoskeletal: No gross abnormality ?Psychiatric:unable to assess ?Neurologic:no involuntary movements   ?   ?   ?Lab Data:  ? ?Basic Metabolic Panel: ?Recent Labs  ?Lab 01/14/22 ?5852 01/15/22 ?0410 01/16/22 ?7782 01/17/22 ?4235 01/18/22 ?0355  ?NA 145 147* 147* 149* 147*  ?K 3.9 3.3* 3.9 4.0 3.4*  ?CL 110 117* 112* 114* 116*  ?CO2 26 24 25 24 26   ?GLUCOSE 128* 129* 146* 136* 153*  ?BUN 38* 35* 33* 40* 37*  ?CREATININE 1.29* 1.18 1.32* 1.62* 1.43*  ?CALCIUM 9.0 7.7* 9.1 9.1 8.5*  ?MG  --  2.2  --   --  2.5*  ? ? ?ABG: ?No results for input(s): PHART, PCO2ART, PO2ART, HCO3, O2SAT in the last 168 hours. ? ?Liver Function Tests: ?Recent Labs  ?Lab 01/18/22 ?0355  ?AST 30  ?ALT 25  ?ALKPHOS 69  ?BILITOT 1.4*  ?PROT 7.0  ?ALBUMIN 2.1*  ? ?No results for input(s): LIPASE, AMYLASE in the last 168 hours. ?No results for input(s): AMMONIA in the last 168 hours. ? ?CBC: ?Recent Labs  ?Lab 01/12/22 ?0304 01/13/22 ?0410 01/15/22 ?0410 01/16/22 ?03/18/22 01/18/22 ?0355  ?WBC 9.6 9.4 8.8 9.0 11.0*   ?HGB 10.6* 10.8* 10.4* 11.5* 10.7*  ?HCT 33.7* 33.8* 33.3* 38.0* 35.3*  ?MCV 92.8 92.9 95.4 96.0 96.2  ?PLT 451* 441* 409* 409* 335  ? ? ?Cardiac Enzymes: ?No results for input(s): CKTOTAL, CKMB, CKMBINDEX, TROPONINI in the last 168 hours. ? ?BNP (last 3 results) ?Recent Labs  ?  12/31/21 ?0209  ?BNP 153.2*  ? ? ?ProBNP (last 3 results) ?No results for input(s): PROBNP in the last 8760 hours. ? ?Radiological Exams: ?0210 RENAL ? ?Result Date: 01/17/2022 ?CLINICAL DATA:  Acute renal failure EXAM: RENAL / URINARY TRACT ULTRASOUND COMPLETE COMPARISON:  CT 12/26/2021 FINDINGS: Right Kidney: Renal measurements: 12.3 x 4.4 x 5.6 cm = volume: 158 mL. Echogenicity within normal limits. No mass or hydronephrosis visualized. Left Kidney: Renal measurements: 11.5 x 5.5 x 5.4 cm = volume: 180 mL. Echogenicity within normal limits. No mass or hydronephrosis visualized. Bladder: Decompressed by Foley catheter. Other: None. IMPRESSION: No evidence of obstructive uropathy. Electronically Signed   By: 12/28/2021 D.O.   On: 01/17/2022 16:34   ? ?Assessment/Plan ?Active Problems: ?  * No active hospital problems. * ? ? ?*** ?*** ?*** ?*** ?*** ? ? ?I have personally seen and evaluated the patient, evaluated laboratory and imaging results, formulated the assessment and plan and placed orders. ?The Patient requires high complexity decision making with multiple systems involvement.  ?Rounds were done with the Respiratory Therapy Director and Staff therapists and discussed with nursing staff also. ? ?  Allyne Gee, MD FCCP ?Pulmonary Critical Care Medicine ?Sleep Medicine  ?

## 2022-01-19 LAB — CULTURE, RESPIRATORY W GRAM STAIN
Culture: NORMAL
Gram Stain: NONE SEEN

## 2022-01-19 LAB — POTASSIUM: Potassium: 3.7 mmol/L (ref 3.5–5.1)

## 2022-01-19 LAB — CALCIUM, IONIZED: Calcium, Ionized, Serum: 5 mg/dL (ref 4.5–5.6)

## 2022-01-21 ENCOUNTER — Other Ambulatory Visit (HOSPITAL_COMMUNITY): Payer: Self-pay

## 2022-01-21 LAB — CBC
HCT: 34.3 % — ABNORMAL LOW (ref 39.0–52.0)
Hemoglobin: 11 g/dL — ABNORMAL LOW (ref 13.0–17.0)
MCH: 29.8 pg (ref 26.0–34.0)
MCHC: 32.1 g/dL (ref 30.0–36.0)
MCV: 93 fL (ref 80.0–100.0)
Platelets: 304 10*3/uL (ref 150–400)
RBC: 3.69 MIL/uL — ABNORMAL LOW (ref 4.22–5.81)
RDW: 16.2 % — ABNORMAL HIGH (ref 11.5–15.5)
WBC: 8.7 10*3/uL (ref 4.0–10.5)
nRBC: 0 % (ref 0.0–0.2)

## 2022-01-21 LAB — COMPREHENSIVE METABOLIC PANEL
ALT: 28 U/L (ref 0–44)
AST: 36 U/L (ref 15–41)
Albumin: 2 g/dL — ABNORMAL LOW (ref 3.5–5.0)
Alkaline Phosphatase: 67 U/L (ref 38–126)
Anion gap: 6 (ref 5–15)
BUN: 23 mg/dL (ref 8–23)
CO2: 24 mmol/L (ref 22–32)
Calcium: 8.4 mg/dL — ABNORMAL LOW (ref 8.9–10.3)
Chloride: 110 mmol/L (ref 98–111)
Creatinine, Ser: 1.05 mg/dL (ref 0.61–1.24)
GFR, Estimated: >60 mL/min (ref >60)
Glucose, Bld: 129 mg/dL — ABNORMAL HIGH (ref 70–99)
Potassium: 3.3 mmol/L — ABNORMAL LOW (ref 3.5–5.1)
Sodium: 140 mmol/L (ref 135–145)
Total Bilirubin: 1.5 mg/dL — ABNORMAL HIGH (ref 0.3–1.2)
Total Protein: 6.6 g/dL (ref 6.5–8.1)

## 2022-01-21 LAB — URIC ACID: Uric Acid, Serum: 2.4 mg/dL — ABNORMAL LOW (ref 3.7–8.6)

## 2022-01-21 IMAGING — DX DG ANKLE COMPLETE 3+V*L*
1 series · 3 of 3 positions shown · non-contrast
Comparison: None Available.

CLINICAL DATA: Left ankle pain.

EXAM:
LEFT ANKLE COMPLETE - 3+ VIEW

[Series 1: ankle · 0.14mm/px · 3 of 3 slices shown]
[im 1/3]
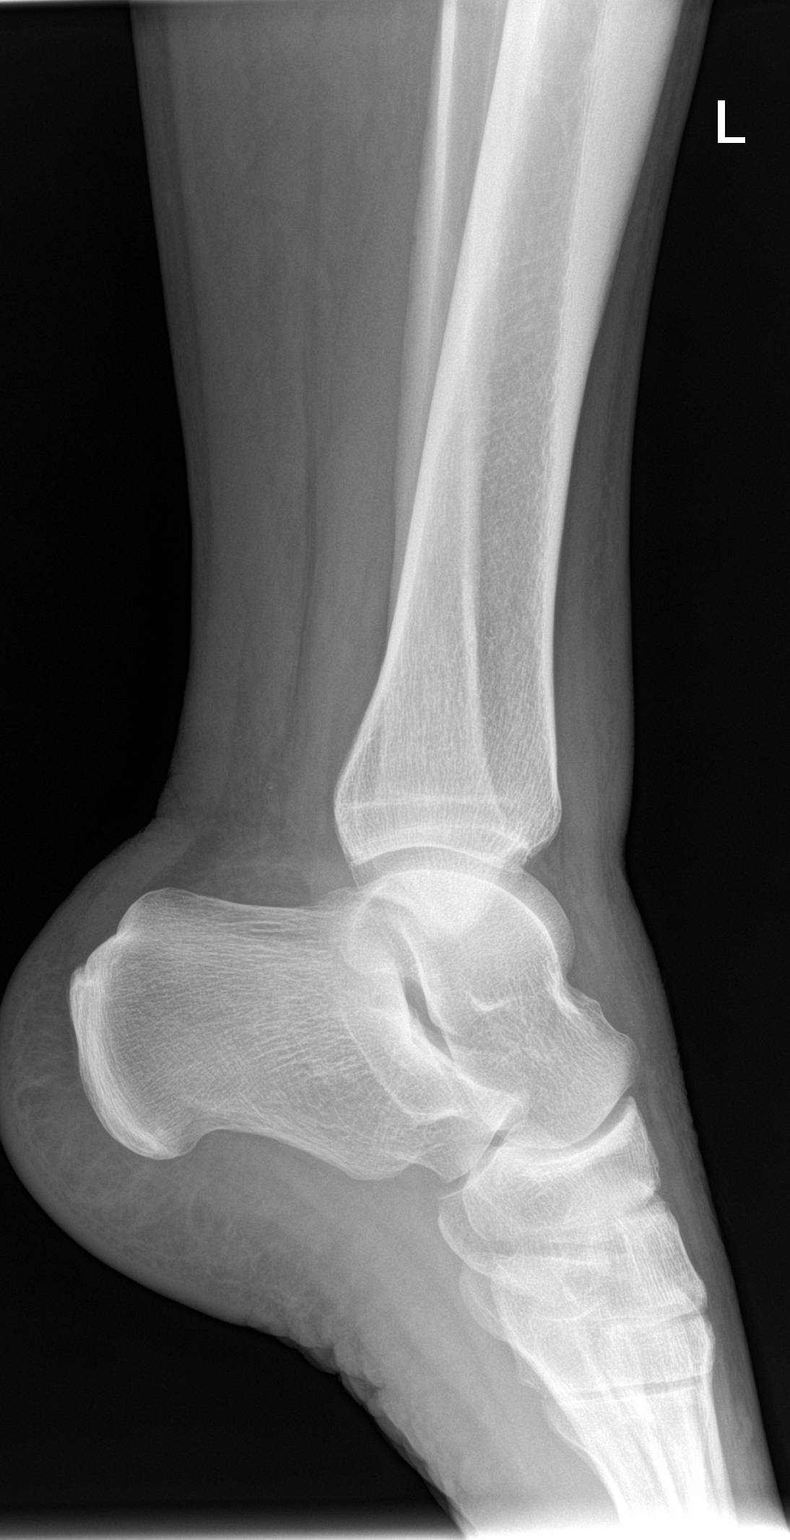
[im 2/3]
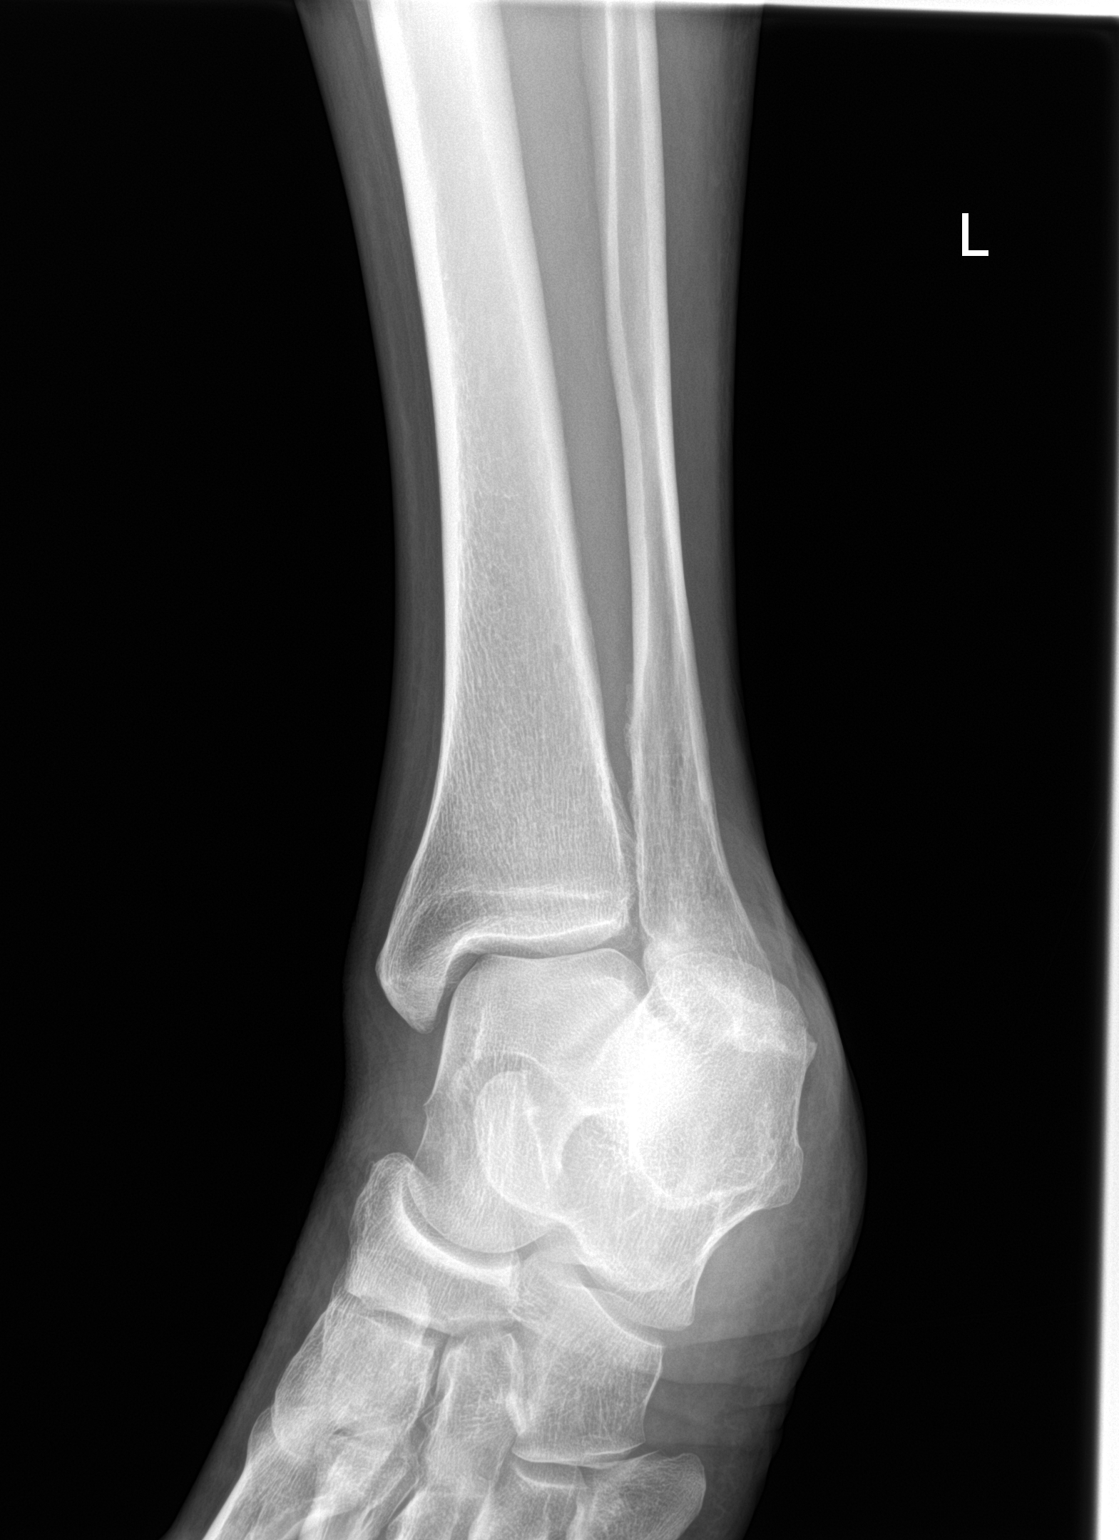
[im 3/3]
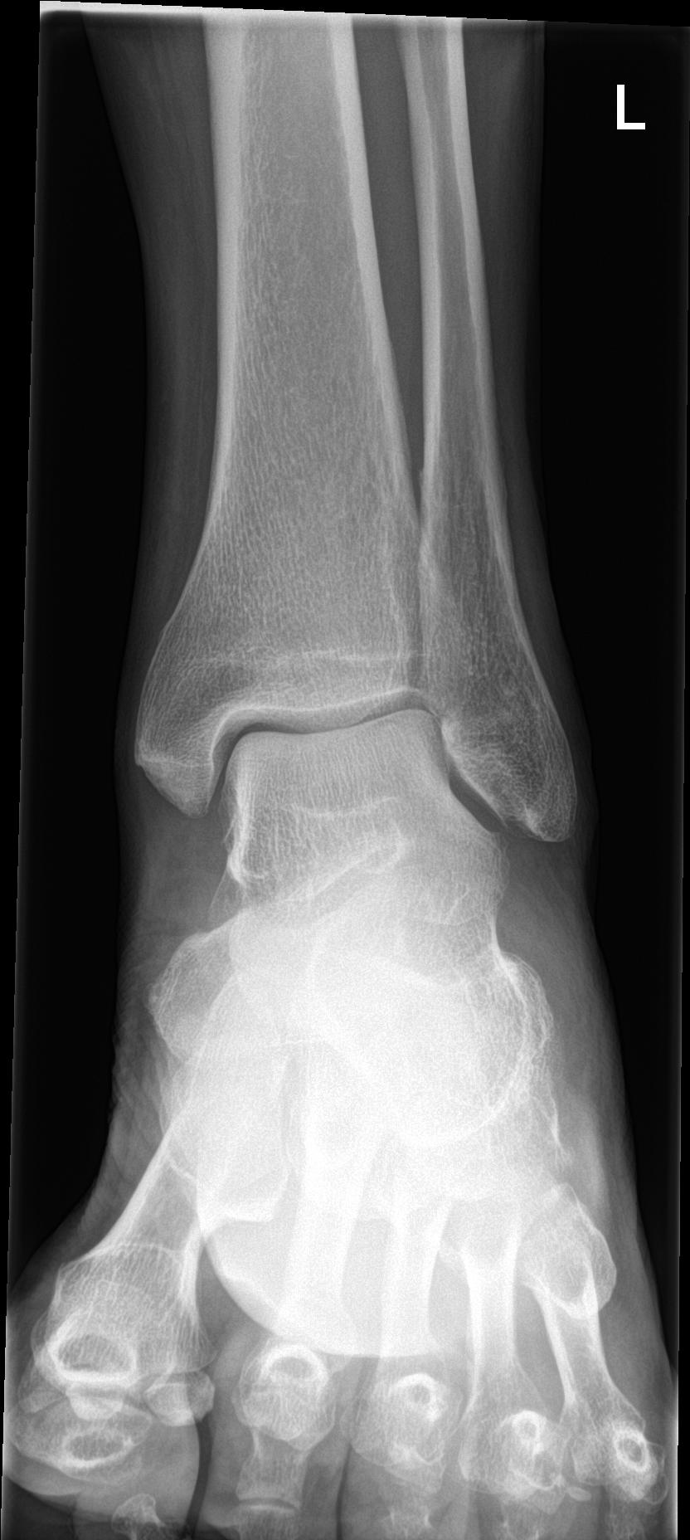

[3 of 3 positions shown; findings below may reference images not displayed]

FINDINGS: There is no evidence of fracture, dislocation, or joint effusion.
Normal alignment. The ankle mortise is preserved. There is no
evidence of arthropathy or other focal bone abnormality. Soft
tissues are unremarkable.
IMPRESSION: Negative radiographs of the left ankle.

## 2022-01-21 IMAGING — DX DG CHEST 1V PORT
1 series · 1 of 1 positions shown · non-contrast
Comparison: [DATE]

CLINICAL DATA: Cough.  Increased secretions.

EXAM:
PORTABLE CHEST 1 VIEW

[chest]
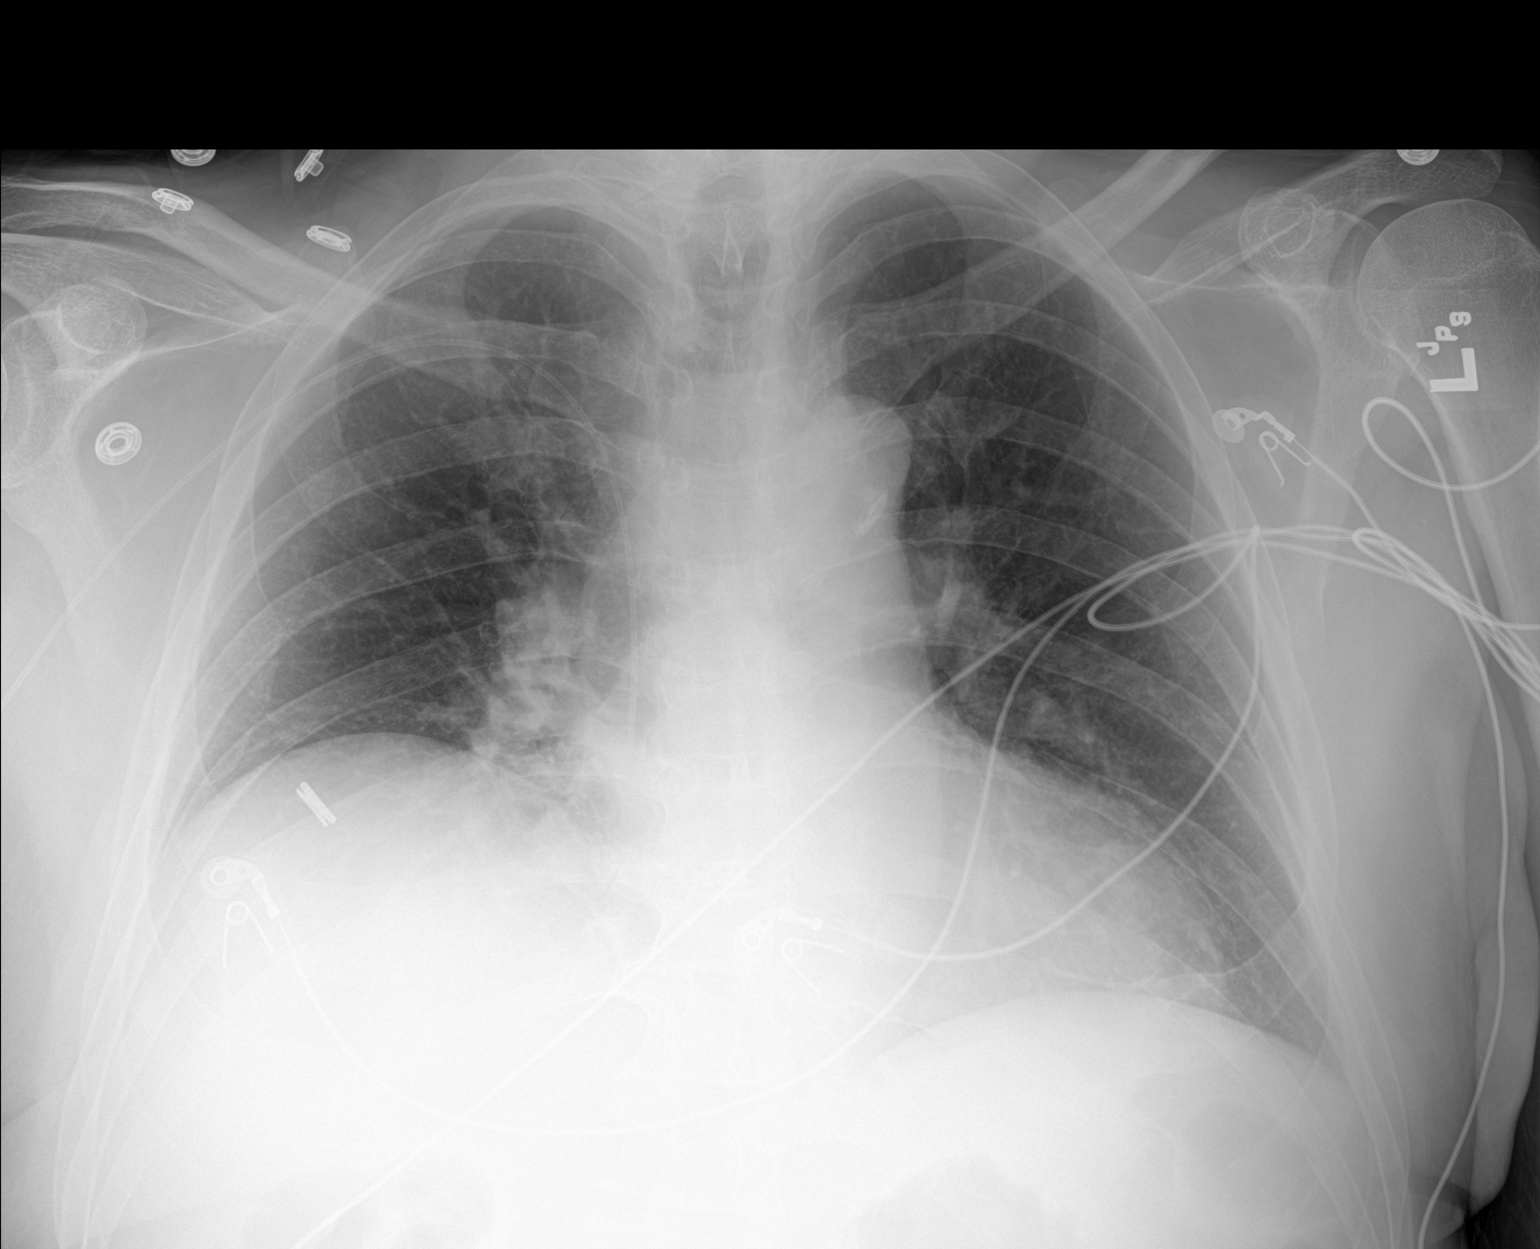

[1 of 1 positions shown; findings below may reference images not displayed]

FINDINGS: Right arm PICC line tip is at the cavoatrial junction. Asymmetric
elevation of the right hemidiaphragm, chronic. Stable
cardiomediastinal contours. No pleural effusion or edema. No
airspace opacities.
IMPRESSION: No acute cardiopulmonary abnormalities.

## 2022-01-22 LAB — CULTURE, BLOOD (ROUTINE X 2)
Culture: NO GROWTH
Culture: NO GROWTH
Special Requests: ADEQUATE

## 2022-01-22 LAB — POTASSIUM: Potassium: 3.8 mmol/L (ref 3.5–5.1)

## 2022-01-24 NOTE — Consult Note (Signed)
?          Infectious Disease Consultation ? ? ?Vincent Ferrell  ATF:[id number]  DOB: [birth year]  DOA: 01/15/2022 ? ?Requesting physician: ?Dr. Manson Passey ? ?Reason for consultation: ?Antibiotic recommendations ? ?History of Present Illness: ?Vincent Ferrell is an 61 y.o. male with history of hypertension, hyperlipidemia, GERD who had recent diagnosis of bronchopneumonia by PCP on 12/22/2021 on doxycycline developed sudden onset of left-sided weakness, left facial droop and right gaze deviation while he was [facility] the vet clinic for an appointment.  He was brought to the ED at [facility].  CT of the head showed subacute appearing infarct in the right PCA territory along with 2 small left MCA strokes.  There was concern for potential endocarditis given the recent pneumonia.  He was noted to have diffuse ST elevation concerning for pericarditis, also had leukocytosis, noted to have Janeway lesions.  CT angio of the head showed right MCA anterior M2 occlusion.  He was taken to IR for mechanical thrombectomy and revascularization.  Postprocedure he remained febrile, tachycardic.  He was admitted to the ICU, required intubation and mechanical ventilation.  He had a transesophageal echocardiogram which showed mitral valve vegetation.  Infectious disease was consulted.  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  He also had GI bleeding and GI was consulted.  Bedside EGD and colonoscopy showed black blood but no source of bleeding identified.  Due to the ongoing fevers he had MRI which showed large abscess of the prostate.  He was taken to the OR on 12/29/2021 per urology for unroofing of the abscess/transurethral resection of prostate.  Hospital course complicated by increased agitation with worsening oxygen requirements.  He had a repeat echocardiogram that showed increased size of the pericardial effusion.  CT surgery was consulted and they did  not recommend any procedures.  On 01/03/2022 he had repeat MRI of the brain with expected stroke evaluation.  On 01/04/2022 he underwent trach placement.  On 01/05/2022 he underwent PEG tube placement.  Repeat echocardiogram showed improved pericardial effusion.  The prostate abscess cultures came back as MSSA.  Antibiotics changed to nafcillin.  The stroke was presumed to be secondary to emboli from the endocarditis.  He also had AKI which improved.  Due to his complex medical problems he was transferred and admitted to [facility]. ? ?Review of Systems:  ?As per HPI otherwise 10 point review of systems negative.  ? ? ?Past Medical History: ?Past Medical History:  ?Diagnosis Date  ? Chest pain in adult 10/24/2017  ? Elevated BP without diagnosis of hypertension 10/24/2017  ? Impaired fasting glucose 10/24/2017  ? Mixed hyperlipidemia 10/24/2017  ? Multifocal pneumonia 12/30/2021  ? Rising PSA level 10/24/2017  ? ? ?Past Surgical History: ?Past Surgical History:  ?Procedure Laterality Date  ? COLONOSCOPY WITH PROPOFOL N/A 12/28/2021  ? Procedure: COLONOSCOPY WITH PROPOFOL;  Surgeon: Tressia Danas, MD;  Location: [facility] ENDOSCOPY;  Service: Gastroenterology;  Laterality: N/A;  ? COLONOSCOPY WITH PROPOFOL N/A 12/29/2021  ? Procedure: COLONOSCOPY WITH PROPOFOL;  Surgeon: Tressia Danas, MD;  Location: [facility] ENDOSCOPY;  Service: Gastroenterology;  Laterality: N/A;  ? ESOPHAGOGASTRODUODENOSCOPY (EGD) WITH PROPOFOL N/A 12/29/2021  ? Procedure: ESOPHAGOGASTRODUODENOSCOPY (EGD) WITH PROPOFOL;  Surgeon: Tressia Danas, MD;  Location: [facility] ENDOSCOPY;  Service: Gastroenterology;  Laterality: N/A;  ? ESOPHAGOGASTRODUODENOSCOPY (EGD) WITH PROPOFOL  01/05/2022  ? Procedure: ESOPHAGOGASTRODUODENOSCOPY (EGD) WITH PROPOFOL;  Surgeon: Diamantina Monks, MD;  Location: [facility] ENDOSCOPY;  Service: General;;  ? IR CT HEAD LTD  12/25/2021  ? IR PERCUTANEOUS ART THROMBECTOMY/INFUSION INTRACRANIAL INC DIAG ANGIO  12/25/2021  ? PEG PLACEMENT N/A  01/05/2022  ? Procedure: PERCUTANEOUS ENDOSCOPIC GASTROSTOMY (PEG) PLACEMENT;  Surgeon: Diamantina Monks, MD;  Location: [facility] ENDOSCOPY;  Service: General;  Laterality: N/A;  ? RADIOLOGY WITH ANESTHESIA N/A 12/25/2021  ? Procedure: IR WITH ANESTHESIA;  Surgeon: Radiologist, Medication, MD;  Location: [facility] OR;  Service: Radiology;  Laterality: N/A;  ? TRANSURETHRAL RESECTION OF PROSTATE N/A 12/29/2021  ? Procedure: TRANSURETHRAL RESECTION OF THE PROSTATE (TURP);  Surgeon: Crista Elliot, MD;  Location: [facility] OR;  Service: Urology;  Laterality: N/A;  ? ? ? ?Allergies: ?  ?Allergies  ?Allergen Reactions  ? Pollen Extract Other (See Comments)  ?  Sneezing   ? ?Social History: ? has no history on file for tobacco use, alcohol use, and drug use. ? ?Family History: ?No significant family history ? ?Physical Exam: ?Vitals: Temperature 99.8, pulse 110, respiratory 26, blood pressure 160/89, oxygen saturation 98% ? ?Constitutional: Awake, not in any acute distress. ?Head: Atraumatic, normocephalic ?Eyes: PERLA, EOMI  ?ENMT: external ears and nose appear normal, normal hearing, moist oral mucosa ?Neck: Supple, decannulated ?CVS: S1-S2   ?Respiratory: Decreased breath sound lower lobes, no rhonchi or wheezing ?Abdomen: soft nontender, nondistended, normal bowel sounds  ?Musculoskeletal: No edema ?Neuro: Left hemiparesis ?Psych: stable mood and affect, mental status ?Skin: no rashes  ? ?Data reviewed:  I have personally reviewed following labs and imaging studies ?Labs:  ?CBC: ?Recent Labs  ?Lab 01/18/22 ?0355 01/21/22 ?0428  ?WBC 11.0* 8.7  ?HGB 10.7* 11.0*  ?HCT 35.3* 34.3*  ?MCV 96.2 93.0  ?PLT 335 304  ? ? ?Basic Metabolic Panel: ?Recent Labs  ?Lab 01/18/22 ?0355 01/19/22 ?9030 01/21/22 ?0923 01/22/22 ?0421  ?NA 147*  --  140  --   ?K 3.4*   < > 3.3* 3.8  ?CL 116*  --  110  --   ?CO2 26  --  24  --   ?GLUCOSE 153*  --  129*  --   ?BUN 37*  --  23  --   ?CREATININE 1.43*  --  1.05  --   ?CALCIUM 8.5*  --  8.4*  --   ?MG 2.5*  --    --   --   ? < > = values in this interval not displayed.  ? ?GFR ?Estimated Creatinine Clearance: 86.4 mL/min (by C-G formula based on SCr of 1.05 mg/dL). ?Liver Function Tests: ?Recent Labs  ?Lab 01/18/22 ?0355 01/21/22 ?0428  ?AST 30 36  ?ALT 25 28  ?ALKPHOS 69 67  ?BILITOT 1.4* 1.5*  ?PROT 7.0 6.6  ?ALBUMIN 2.1* 2.0*  ? ?No results for input(s): LIPASE, AMYLASE in the last 168 hours. ?No results for input(s): AMMONIA in the last 168 hours. ?Coagulation profile ?No results for input(s): INR, PROTIME in the last 168 hours. ? ?Cardiac Enzymes: ?No results for input(s): CKTOTAL, CKMB, CKMBINDEX, TROPONINI in the last 168 hours. ?BNP: ?Invalid input(s): POCBNP ?CBG: ?No results for input(s): GLUCAP in the last 168 hours. ?D-Dimer ?No results for input(s): DDIMER in the last 72 hours. ?Hgb A1c ?No results for input(s): HGBA1C in the last 72 hours. ?Lipid Profile ?No results for input(s): CHOL, HDL, LDLCALC, TRIG, CHOLHDL, LDLDIRECT in the last 72 hours. ?Thyroid function studies ?No results for input(s): TSH, T4TOTAL, T3FREE, THYROIDAB in the last 72 hours. ? ?Invalid input(s): FREET3 ?Anemia work up ?No results for input(s): VITAMINB12,  FOLATE, FERRITIN, TIBC, IRON, RETICCTPCT in the last 72 hours. ?Urinalysis ?   ?Component Value Date/Time  ? COLORURINE YELLOW 01/17/2022 1322  ? APPEARANCEUR CLEAR 01/17/2022 1322  ? LABSPEC 1.013 01/17/2022 1322  ? PHURINE 5.0 01/17/2022 1322  ? GLUCOSEU NEGATIVE 01/17/2022 1322  ? HGBUR NEGATIVE 01/17/2022 1322  ? BILIRUBINUR NEGATIVE 01/17/2022 1322  ? KETONESUR NEGATIVE 01/17/2022 1322  ? PROTEINUR NEGATIVE 01/17/2022 1322  ? NITRITE NEGATIVE 01/17/2022 1322  ? LEUKOCYTESUR NEGATIVE 01/17/2022 1322  ? ? ? ?Sepsis Labs ?Invalid input(s): PROCALCITONIN,  WBC,  LACTICIDVEN ?Microbiology ?Recent Results (from the past 240 hour(s))  ?Culture, Respiratory w Gram Stain     Status: None  ? Collection Time: 01/15/22  4:03 PM  ? Specimen: Tracheal Aspirate; Respiratory  ?Result Value Ref  Range Status  ? Specimen Description TRACHEAL ASPIRATE  Final  ? Special Requests NONE  Final  ? Gram Stain   Final  ?  RARE WBC PRESENT, PREDOMINANTLY PMN ?FEW GRAM POSITIVE RODS ?FEW GRAM NEGATIVE RODS

## 2022-01-25 LAB — CBC
HCT: 37.8 % — ABNORMAL LOW (ref 39.0–52.0)
Hemoglobin: 11.8 g/dL — ABNORMAL LOW (ref 13.0–17.0)
MCH: 29.6 pg (ref 26.0–34.0)
MCHC: 31.2 g/dL (ref 30.0–36.0)
MCV: 95 fL (ref 80.0–100.0)
Platelets: 268 10*3/uL (ref 150–400)
RBC: 3.98 MIL/uL — ABNORMAL LOW (ref 4.22–5.81)
RDW: 17.2 % — ABNORMAL HIGH (ref 11.5–15.5)
WBC: 7.3 10*3/uL (ref 4.0–10.5)
nRBC: 0 % (ref 0.0–0.2)

## 2022-01-25 LAB — COMPREHENSIVE METABOLIC PANEL
ALT: 27 U/L (ref 0–44)
AST: 28 U/L (ref 15–41)
Albumin: 2.1 g/dL — ABNORMAL LOW (ref 3.5–5.0)
Alkaline Phosphatase: 77 U/L (ref 38–126)
Anion gap: 9 (ref 5–15)
BUN: 18 mg/dL (ref 8–23)
CO2: 26 mmol/L (ref 22–32)
Calcium: 9 mg/dL (ref 8.9–10.3)
Chloride: 103 mmol/L (ref 98–111)
Creatinine, Ser: 1.02 mg/dL (ref 0.61–1.24)
GFR, Estimated: >60 mL/min (ref >60)
Glucose, Bld: 133 mg/dL — ABNORMAL HIGH (ref 70–99)
Potassium: 3.8 mmol/L (ref 3.5–5.1)
Sodium: 138 mmol/L (ref 135–145)
Total Bilirubin: 0.9 mg/dL (ref 0.3–1.2)
Total Protein: 7 g/dL (ref 6.5–8.1)

## 2022-01-25 LAB — C-REACTIVE PROTEIN: CRP: 5.5 mg/dL — ABNORMAL HIGH (ref <1.0)

## 2022-01-25 LAB — MAGNESIUM: Magnesium: 2.2 mg/dL (ref 1.7–2.4)

## 2022-01-27 ENCOUNTER — Other Ambulatory Visit (HOSPITAL_COMMUNITY): Payer: Self-pay

## 2022-01-27 IMAGING — DX DG CHEST 1V PORT
1 series · 1 of 1 positions shown · non-contrast
Comparison: Chest x-ray [DATE].

CLINICAL DATA: 61-year-old male with history of respiratory
failure.

EXAM:
PORTABLE CHEST 1 VIEW

[chest ap]
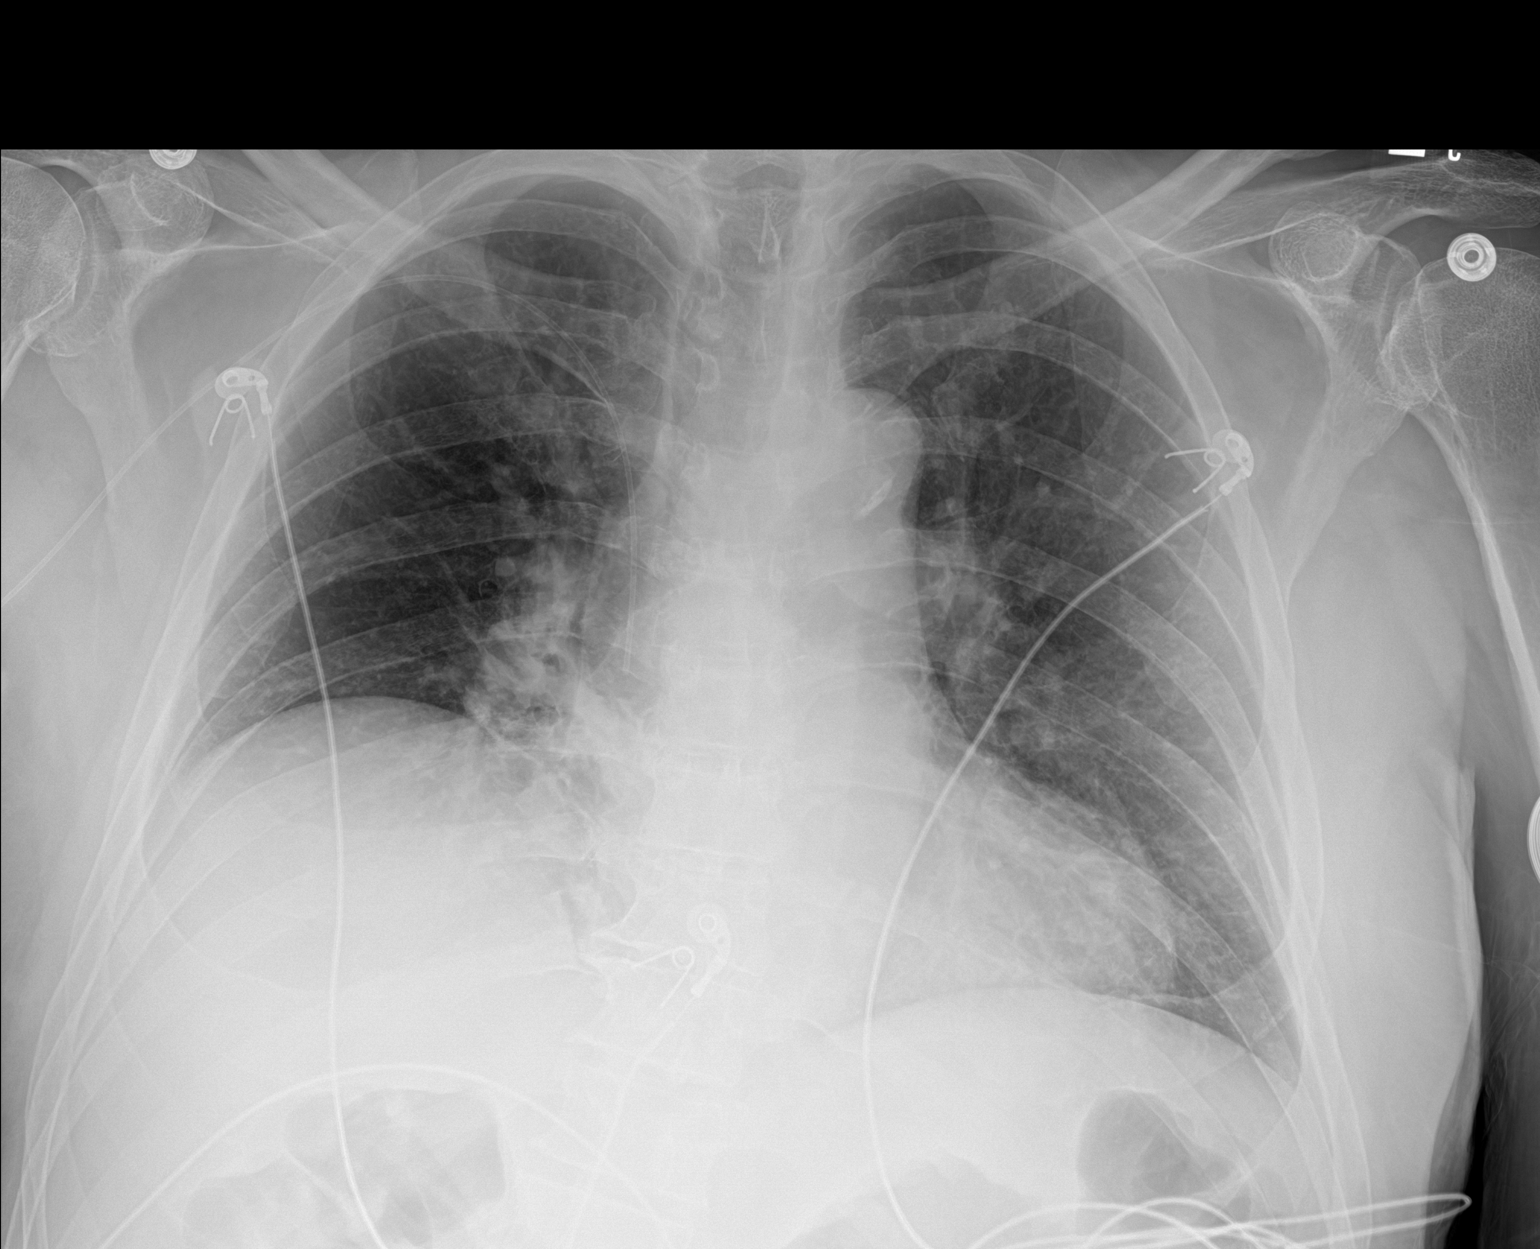

[1 of 1 positions shown; findings below may reference images not displayed]

FINDINGS: There is a right upper extremity PICC with tip terminating in the
superior cavoatrial junction. Chronic elevation of the right
hemidiaphragm again noted. Areas of interstitial prominence and
peribronchial cuffing are noted throughout the left lung, most
severe in the left mid to lower lung. Right lung appears relatively
clear. No pleural effusions. No pneumothorax. No evidence of
pulmonary edema. Heart size is normal. Upper mediastinal contours
are within normal limits. Atherosclerotic calcifications are noted
in the thoracic aorta.
IMPRESSION: 1. Areas of interstitial prominence and peribronchial cuffing in the
left lung, most severe in the left lower lobe, concerning for
bronchitis and potentially developing bronchopneumonia.
2. Aortic atherosclerosis.
3. Chronic elevation of the right hemidiaphragm.

## 2022-01-28 LAB — CBC
HCT: 36.5 % — ABNORMAL LOW (ref 39.0–52.0)
Hemoglobin: 12.2 g/dL — ABNORMAL LOW (ref 13.0–17.0)
MCH: 30.8 pg (ref 26.0–34.0)
MCHC: 33.4 g/dL (ref 30.0–36.0)
MCV: 92.2 fL (ref 80.0–100.0)
Platelets: 232 10*3/uL (ref 150–400)
RBC: 3.96 MIL/uL — ABNORMAL LOW (ref 4.22–5.81)
RDW: 17.7 % — ABNORMAL HIGH (ref 11.5–15.5)
WBC: 7.9 10*3/uL (ref 4.0–10.5)
nRBC: 0 % (ref 0.0–0.2)

## 2022-01-28 LAB — BASIC METABOLIC PANEL
Anion gap: 10 (ref 5–15)
BUN: 18 mg/dL (ref 8–23)
CO2: 22 mmol/L (ref 22–32)
Calcium: 8.8 mg/dL — ABNORMAL LOW (ref 8.9–10.3)
Chloride: 103 mmol/L (ref 98–111)
Creatinine, Ser: 0.99 mg/dL (ref 0.61–1.24)
GFR, Estimated: >60 mL/min (ref >60)
Glucose, Bld: 130 mg/dL — ABNORMAL HIGH (ref 70–99)
Potassium: 3.8 mmol/L (ref 3.5–5.1)
Sodium: 135 mmol/L (ref 135–145)

## 2022-01-28 LAB — MAGNESIUM: Magnesium: 2 mg/dL (ref 1.7–2.4)

## 2022-01-28 NOTE — Consult Note (Signed)
Referring Physician: Satira Sark, MD  Vincent Ferrell is an 61 y.o. male.                       Chief Complaint: Endocarditis  HPI: 61 years old male with h/o HTN, HLD, GERD, Bronchopneumonia, had right PCA and left MCA strokes. He had MV vegetation on TEE. IV vancomycin and ceftriaxone were started.  He then developed abscess of the prostate. The abscess grew MSSA and antibiotic was changed to nafcillin. He needs antibiotic till 02/12/2022 and may need repeat TEE near end of therapy.  He had tracheostomy and now he is decannulated. His acute kidney injury is improved post IV fluids administration.  Past Medical History:  Diagnosis Date   Chest pain in adult 10/24/2017   Elevated BP without diagnosis of hypertension 10/24/2017   Impaired fasting glucose 10/24/2017   Mixed hyperlipidemia 10/24/2017   Multifocal pneumonia 12/30/2021   Rising PSA level 10/24/2017      Past Surgical History:  Procedure Laterality Date   COLONOSCOPY WITH PROPOFOL N/A 12/28/2021   Procedure: COLONOSCOPY WITH PROPOFOL;  Surgeon: Thornton Park, MD;  Location: [place];  Service: Gastroenterology;  Laterality: N/A;   COLONOSCOPY WITH PROPOFOL N/A 12/29/2021   Procedure: COLONOSCOPY WITH PROPOFOL;  Surgeon: Thornton Park, MD;  Location: [place];  Service: Gastroenterology;  Laterality: N/A;   ESOPHAGOGASTRODUODENOSCOPY (EGD) WITH PROPOFOL N/A 12/29/2021   Procedure: ESOPHAGOGASTRODUODENOSCOPY (EGD) WITH PROPOFOL;  Surgeon: Thornton Park, MD;  Location: [place];  Service: Gastroenterology;  Laterality: N/A;   ESOPHAGOGASTRODUODENOSCOPY (EGD) WITH PROPOFOL  01/05/2022   Procedure: ESOPHAGOGASTRODUODENOSCOPY (EGD) WITH PROPOFOL;  Surgeon: Jesusita Oka, MD;  Location: [place];  Service: General;;   IR CT HEAD LTD  12/25/2021   IR PERCUTANEOUS ART THROMBECTOMY/INFUSION INTRACRANIAL INC DIAG ANGIO  12/25/2021   PEG PLACEMENT N/A 01/05/2022   Procedure: PERCUTANEOUS ENDOSCOPIC GASTROSTOMY  (PEG) PLACEMENT;  Surgeon: Jesusita Oka, MD;  Location: [place];  Service: General;  Laterality: N/A;   RADIOLOGY WITH ANESTHESIA N/A 12/25/2021   Procedure: IR WITH ANESTHESIA;  Surgeon: Radiologist, Medication, MD;  Location: [place];  Service: Radiology;  Laterality: N/A;   TRANSURETHRAL RESECTION OF PROSTATE N/A 12/29/2021   Procedure: TRANSURETHRAL RESECTION OF THE PROSTATE (TURP);  Surgeon: Lucas Mallow, MD;  Location: [place];  Service: Urology;  Laterality: N/A;    No family history on file. Social History:  has no history on file for tobacco use, alcohol use, and drug use.  Allergies:  Allergies  Allergen Reactions   Pollen Extract Other (See Comments)    Sneezing     Medications Prior to Admission  Medication Sig Dispense Refill   atorvastatin (LIPITOR) 40 MG tablet Place 1 tablet (40 mg total) into feeding tube daily. 30 tablet 0   Chlorhexidine Gluconate Cloth 2 % PADS Apply 6 each topically daily.     chlorhexidine gluconate, MEDLINE KIT, (PERIDEX) 0.12 % solution 15 mLs by Mouth Rinse route 2 (two) times daily. 120 mL 0   clonazePAM (KLONOPIN) 0.25 MG disintegrating tablet Place 1 tablet (0.25 mg total) into feeding tube 2 (two) times daily as needed (anxiety). 60 tablet 0   fiber (NUTRISOURCE FIBER) PACK packet Place 1 packet into feeding tube 2 (two) times daily.     guaiFENesin (ROBITUSSIN) 100 MG/5ML liquid Place 15 mLs into feeding tube every 6 (six) hours. 120 mL 0   HYDROcodone-acetaminophen (NORCO/VICODIN) 5-325 MG tablet Place 1-2 tablets into feeding tube every 6 (six) hours  as needed for moderate pain or severe pain. 30 tablet 0   lisinopril (ZESTRIL) 10 MG tablet Place 1 tablet (10 mg total) into feeding tube daily.     nafcillin IVPB Inject 12 g into the vein daily. As a continuous infusion  Indication:  MSSA bacteremia/endocarditis of the mitral valve First Dose: Yes Last Day of Therapy:  02/12/22 Labs - Once weekly:  CBC/D and BMP, Labs - Every  other week:  ESR and CRP 31 Units 0   Nutritional Supplements (FEEDING SUPPLEMENT, JEVITY 1.5 CAL/FIBER,) LIQD Place 1,000 mLs into feeding tube continuous.     Nutritional Supplements (FEEDING SUPPLEMENT, PROSOURCE TF,) liquid Place 90 mLs into feeding tube 2 (two) times daily.     Nystatin (GERHARDT'S BUTT CREAM) CREA Apply 1 application. topically 4 (four) times daily.     pantoprazole sodium (PROTONIX) 40 mg Place 40 mg into feeding tube 2 (two) times daily.     QUEtiapine (SEROQUEL) 25 MG tablet 0.5 tablets (12.5 mg total) by Per NG tube route at bedtime.     Water For Irrigation, Sterile (FREE WATER) SOLN Place 200 mLs into feeding tube every 8 (eight) hours.      Results for orders placed or performed during the hospital encounter of 01/15/22 (from the past 48 hour(s))  Culture, blood (Routine X 2) w Reflex to ID Panel     Status: None (Preliminary result)   Collection Time: 01/27/22 10:59 AM   Specimen: BLOOD  Result Value Ref Range   Specimen Description BLOOD LEFT ANTECUBITAL    Special Requests      BOTTLES DRAWN AEROBIC AND ANAEROBIC Blood Culture results may not be optimal due to an inadequate volume of blood received in culture bottles   Culture      NO GROWTH < 24 HOURS Performed at [place] [address] [place], [place] [address]    Report Status PENDING   Culture, blood (Routine X 2) w Reflex to ID Panel     Status: None (Preliminary result)   Collection Time: 01/27/22 11:01 AM   Specimen: BLOOD  Result Value Ref Range   Specimen Description BLOOD BLOOD RIGHT HAND    Special Requests      BOTTLES DRAWN AEROBIC AND ANAEROBIC Blood Culture results may not be optimal due to an inadequate volume of blood received in culture bottles   Culture      NO GROWTH < 24 HOURS Performed at [facility], [place] [address], [place], [place] [address]    Report Status PENDING   Basic metabolic panel     Status: Abnormal   Collection Time: 01/28/22  4:32 AM  Result  Value Ref Range   Sodium 135 135 - 145 mmol/L   Potassium 3.8 3.5 - 5.1 mmol/L   Chloride 103 98 - 111 mmol/L   CO2 22 22 - 32 mmol/L   Glucose, Bld 130 (H) 70 - 99 mg/dL    Comment: Glucose reference range applies only to samples taken after fasting for at least 8 hours.   BUN 18 8 - 23 mg/dL   Creatinine, Ser 0.99 0.61 - 1.24 mg/dL   Calcium 8.8 (L) 8.9 - 10.3 mg/dL   GFR, Estimated >60 >60 mL/min    Comment: (NOTE) Calculated using the CKD-EPI Creatinine Equation (2021)    Anion gap 10 5 - 15    Comment: Performed at [place] [address], [place], [place] [address]  Magnesium  Status: None   Collection Time: 01/28/22  4:32 AM  Result Value Ref Range   Magnesium 2.0 1.7 - 2.4 mg/dL    Comment: Performed at [place] [address], [place], [place] [address]  CBC     Status: Abnormal   Collection Time: 01/28/22  4:32 AM  Result Value Ref Range   WBC 7.9 4.0 - 10.5 K/uL   RBC 3.96 (L) 4.22 - 5.81 MIL/uL   Hemoglobin 12.2 (L) 13.0 - 17.0 g/dL   HCT 36.5 (L) 39.0 - 52.0 %   MCV 92.2 80.0 - 100.0 fL   MCH 30.8 26.0 - 34.0 pg   MCHC 33.4 30.0 - 36.0 g/dL   RDW 17.7 (H) 11.5 - 15.5 %   Platelets 232 150 - 400 K/uL   nRBC 0.0 0.0 - 0.2 %    Comment: Performed at [facility], [place] [address] [place], [place] [address]   DG Chest Port 1 View  Result Date: 01/27/2022 CLINICAL DATA:  61 year old male with history of respiratory failure. EXAM: PORTABLE CHEST 1 VIEW COMPARISON:  Chest x-ray 01/21/2022. FINDINGS: There is a right upper extremity PICC with tip terminating in the superior cavoatrial junction. Chronic elevation of the right hemidiaphragm again noted. Areas of interstitial prominence and peribronchial cuffing are noted throughout the left lung, most severe in the left mid to lower lung. Right lung appears relatively clear. No pleural effusions. No pneumothorax. No evidence of pulmonary edema. Heart size is normal. Upper mediastinal contours are  within normal limits. Atherosclerotic calcifications are noted in the thoracic aorta. IMPRESSION: 1. Areas of interstitial prominence and peribronchial cuffing in the left lung, most severe in the left lower lobe, concerning for bronchitis and potentially developing bronchopneumonia. 2. Aortic atherosclerosis. 3. Chronic elevation of the right hemidiaphragm. Electronically Signed   By: Vinnie Langton M.D.   On: 01/27/2022 11:13    Review Of Systems Constitutional: Positive fever, chills, weight loss or gain. Eyes: No vision change, wears glasses. No discharge or pain. Ears: No hearing loss, No tinnitus. Respiratory: No asthma, COPD, pneumonias. Positive shortness of breath. No hemoptysis. Cardiovascular: No chest pain, palpitation, leg edema. Gastrointestinal: No nausea, vomiting, diarrhea, constipation. Positive GI bleed. No hepatitis. Genitourinary: No dysuria, hematuria, kidney stone. No incontinance. Neurological: No headache, stroke, seizures.  Psychiatry: No psych facility admission for anxiety, depression, suicide. No detox. Skin: No rash. Musculoskeletal: Positive joint pain, fibromyalgia. No neck pain, back pain. Lymphadenopathy: No lymphadenopathy. Hematology: Positive anemia, no easy bruising.  P: 120, R: 18, BP 139/76, O2 sat 98 to 100 % on room air. There were no vitals taken for this visit. There is no height or weight on file to calculate BMI. General appearance: alert, cooperative, appears stated age and mild respiratory distress Head: Normocephalic, atraumatic. Eyes: Blue eyes, pink conjunctiva, corneas clear.  Neck: No adenopathy, no carotid bruit, no JVD, supple, symmetrical, trachea midline and thyroid not enlarged. Resp: Clearing to auscultation bilaterally. Cardio: Regular rate and rhythm, S1, S2 normal, II/VI systolic murmur, no click, rub or gallop GI: Soft, bowel sounds normal; no organomegaly. Extremities: No edema, cyanosis or clubbing. Skin: Warm and dry.   Neurologic: Alert and oriented X 1, normal strength.   Assessment/Plan MV endocarditis with MSSA. Prostate abscess, s/p surgery Acute hypoxic respiratory failure Stroke with septic emboli Encephalopathy S/P GI bleed Dysphagia Protein calorie malnnutrition  Plan: Repeat TEE in 2 weeks. Continue medical treatment.   Time spent: Review of old records, Lab, x-rays, EKG,  other cardiac tests, examination, discussion with patient/Nurse/Doctor over 70 minutes.  Birdie Riddle, MD  01/28/2022, 12:45 PM

## 2022-01-30 LAB — BASIC METABOLIC PANEL
Anion gap: 9 (ref 5–15)
BUN: 20 mg/dL (ref 8–23)
CO2: 24 mmol/L (ref 22–32)
Calcium: 9 mg/dL (ref 8.9–10.3)
Chloride: 103 mmol/L (ref 98–111)
Creatinine, Ser: 1.03 mg/dL (ref 0.61–1.24)
GFR, Estimated: >60 mL/min (ref >60)
Glucose, Bld: 103 mg/dL — ABNORMAL HIGH (ref 70–99)
Potassium: 4.7 mmol/L (ref 3.5–5.1)
Sodium: 136 mmol/L (ref 135–145)

## 2022-01-30 LAB — CBC
HCT: 39 % (ref 39.0–52.0)
Hemoglobin: 12.8 g/dL — ABNORMAL LOW (ref 13.0–17.0)
MCH: 30.8 pg (ref 26.0–34.0)
MCHC: 32.8 g/dL (ref 30.0–36.0)
MCV: 93.8 fL (ref 80.0–100.0)
Platelets: 263 10*3/uL (ref 150–400)
RBC: 4.16 MIL/uL — ABNORMAL LOW (ref 4.22–5.81)
RDW: 17.9 % — ABNORMAL HIGH (ref 11.5–15.5)
WBC: 6.4 10*3/uL (ref 4.0–10.5)
nRBC: 0 % (ref 0.0–0.2)

## 2022-02-01 LAB — COMPREHENSIVE METABOLIC PANEL
ALT: 22 U/L (ref 0–44)
AST: 29 U/L (ref 15–41)
Albumin: 2.1 g/dL — ABNORMAL LOW (ref 3.5–5.0)
Alkaline Phosphatase: 82 U/L (ref 38–126)
Anion gap: 9 (ref 5–15)
BUN: 22 mg/dL (ref 8–23)
CO2: 25 mmol/L (ref 22–32)
Calcium: 9.1 mg/dL (ref 8.9–10.3)
Chloride: 105 mmol/L (ref 98–111)
Creatinine, Ser: 0.99 mg/dL (ref 0.61–1.24)
GFR, Estimated: >60 mL/min (ref >60)
Glucose, Bld: 133 mg/dL — ABNORMAL HIGH (ref 70–99)
Potassium: 4.6 mmol/L (ref 3.5–5.1)
Sodium: 139 mmol/L (ref 135–145)
Total Bilirubin: 1.6 mg/dL — ABNORMAL HIGH (ref 0.3–1.2)
Total Protein: 6.8 g/dL (ref 6.5–8.1)

## 2022-02-01 LAB — CBC
HCT: 37.5 % — ABNORMAL LOW (ref 39.0–52.0)
Hemoglobin: 11.9 g/dL — ABNORMAL LOW (ref 13.0–17.0)
MCH: 30.2 pg (ref 26.0–34.0)
MCHC: 31.7 g/dL (ref 30.0–36.0)
MCV: 95.2 fL (ref 80.0–100.0)
Platelets: 250 10*3/uL (ref 150–400)
RBC: 3.94 MIL/uL — ABNORMAL LOW (ref 4.22–5.81)
RDW: 18.3 % — ABNORMAL HIGH (ref 11.5–15.5)
WBC: 5.9 10*3/uL (ref 4.0–10.5)
nRBC: 0 % (ref 0.0–0.2)

## 2022-02-01 LAB — CULTURE, BLOOD (ROUTINE X 2)
Culture: NO GROWTH
Culture: NO GROWTH

## 2022-02-01 LAB — C-REACTIVE PROTEIN: CRP: 4.2 mg/dL — ABNORMAL HIGH (ref <1.0)

## 2022-02-02 ENCOUNTER — Other Ambulatory Visit (HOSPITAL_COMMUNITY): Payer: Self-pay

## 2022-02-02 LAB — BASIC METABOLIC PANEL
Anion gap: 8 (ref 5–15)
BUN: 25 mg/dL — ABNORMAL HIGH (ref 8–23)
CO2: 26 mmol/L (ref 22–32)
Calcium: 9.2 mg/dL (ref 8.9–10.3)
Chloride: 105 mmol/L (ref 98–111)
Creatinine, Ser: 1 mg/dL (ref 0.61–1.24)
GFR, Estimated: >60 mL/min (ref >60)
Glucose, Bld: 114 mg/dL — ABNORMAL HIGH (ref 70–99)
Potassium: 4 mmol/L (ref 3.5–5.1)
Sodium: 139 mmol/L (ref 135–145)

## 2022-02-02 LAB — MAGNESIUM: Magnesium: 2.2 mg/dL (ref 1.7–2.4)

## 2022-02-03 ENCOUNTER — Other Ambulatory Visit (HOSPITAL_COMMUNITY): Payer: Self-pay

## 2022-02-04 LAB — CBC
HCT: 35.7 % — ABNORMAL LOW (ref 39.0–52.0)
Hemoglobin: 11.4 g/dL — ABNORMAL LOW (ref 13.0–17.0)
MCH: 30.6 pg (ref 26.0–34.0)
MCHC: 31.9 g/dL (ref 30.0–36.0)
MCV: 96 fL (ref 80.0–100.0)
Platelets: 264 10*3/uL (ref 150–400)
RBC: 3.72 MIL/uL — ABNORMAL LOW (ref 4.22–5.81)
RDW: 18.4 % — ABNORMAL HIGH (ref 11.5–15.5)
WBC: 5.1 10*3/uL (ref 4.0–10.5)
nRBC: 0 % (ref 0.0–0.2)

## 2022-02-04 LAB — BASIC METABOLIC PANEL
Anion gap: 6 (ref 5–15)
BUN: 20 mg/dL (ref 8–23)
CO2: 30 mmol/L (ref 22–32)
Calcium: 9.1 mg/dL (ref 8.9–10.3)
Chloride: 106 mmol/L (ref 98–111)
Creatinine, Ser: 1 mg/dL (ref 0.61–1.24)
GFR, Estimated: >60 mL/min (ref >60)
Glucose, Bld: 122 mg/dL — ABNORMAL HIGH (ref 70–99)
Potassium: 3.6 mmol/L (ref 3.5–5.1)
Sodium: 142 mmol/L (ref 135–145)

## 2022-02-04 LAB — C-REACTIVE PROTEIN: CRP: 2.9 mg/dL — ABNORMAL HIGH (ref <1.0)

## 2022-02-08 LAB — CBC
HCT: 37.6 % — ABNORMAL LOW (ref 39.0–52.0)
Hemoglobin: 11.9 g/dL — ABNORMAL LOW (ref 13.0–17.0)
MCH: 30.4 pg (ref 26.0–34.0)
MCHC: 31.6 g/dL (ref 30.0–36.0)
MCV: 95.9 fL (ref 80.0–100.0)
Platelets: 259 10*3/uL (ref 150–400)
RBC: 3.92 MIL/uL — ABNORMAL LOW (ref 4.22–5.81)
RDW: 18.7 % — ABNORMAL HIGH (ref 11.5–15.5)
WBC: 5.2 10*3/uL (ref 4.0–10.5)
nRBC: 0 % (ref 0.0–0.2)

## 2022-02-08 LAB — COMPREHENSIVE METABOLIC PANEL
ALT: 24 U/L (ref 0–44)
AST: 25 U/L (ref 15–41)
Albumin: 2.1 g/dL — ABNORMAL LOW (ref 3.5–5.0)
Alkaline Phosphatase: 80 U/L (ref 38–126)
Anion gap: 10 (ref 5–15)
BUN: 19 mg/dL (ref 8–23)
CO2: 23 mmol/L (ref 22–32)
Calcium: 9 mg/dL (ref 8.9–10.3)
Chloride: 105 mmol/L (ref 98–111)
Creatinine, Ser: 0.99 mg/dL (ref 0.61–1.24)
GFR, Estimated: >60 mL/min (ref >60)
Glucose, Bld: 122 mg/dL — ABNORMAL HIGH (ref 70–99)
Potassium: 3.6 mmol/L (ref 3.5–5.1)
Sodium: 138 mmol/L (ref 135–145)
Total Bilirubin: 1.4 mg/dL — ABNORMAL HIGH (ref 0.3–1.2)
Total Protein: 6.3 g/dL — ABNORMAL LOW (ref 6.5–8.1)

## 2022-02-08 LAB — MAGNESIUM: Magnesium: 2 mg/dL (ref 1.7–2.4)

## 2022-02-08 LAB — C-REACTIVE PROTEIN: CRP: 1.5 mg/dL — ABNORMAL HIGH (ref <1.0)

## 2022-02-11 ENCOUNTER — Inpatient Hospital Stay: Payer: Managed Care, Other (non HMO) | Admitting: Infectious Diseases

## 2022-03-09 ENCOUNTER — Inpatient Hospital Stay: Payer: Managed Care, Other (non HMO) | Admitting: Infectious Diseases

## 2022-03-25 ENCOUNTER — Inpatient Hospital Stay: Payer: Managed Care, Other (non HMO) | Admitting: Adult Health

## 2022-04-22 ENCOUNTER — Other Ambulatory Visit: Payer: Self-pay

## 2022-04-22 NOTE — Patient Outreach (Signed)
[facility]) Care Management  04/22/2022  Vincent Ferrell 1961/04/27 [id number]   First telephone outreach attempt to obtain mRS. No answer. Unable to leave message for returned call.  Vanice Sarah [facility] Management Assistant [phone]

## 2022-04-23 ENCOUNTER — Other Ambulatory Visit: Payer: Self-pay

## 2022-04-23 NOTE — Patient Outreach (Signed)
[facility]) Care Management  04/23/2022  Brinson Tozzi Mar 17, 1961 [id number]   Telephone outreach to patient to obtain mRS was successfully completed. MRS= 3  Vanice Sarah [facility] Care Management Assistant [phone]

## 2022-05-03 ENCOUNTER — Encounter: Payer: Self-pay | Admitting: Adult Health

## 2022-05-03 ENCOUNTER — Ambulatory Visit (INDEPENDENT_AMBULATORY_CARE_PROVIDER_SITE_OTHER): Payer: Managed Care, Other (non HMO) | Admitting: Adult Health

## 2022-05-03 VITALS — BP 114/75 | HR 64 | Ht 72.0 in | Wt 188.0 lb

## 2022-05-03 DIAGNOSIS — E785 Hyperlipidemia, unspecified: Secondary | ICD-10-CM

## 2022-05-03 DIAGNOSIS — I63411 Cerebral infarction due to embolism of right middle cerebral artery: Secondary | ICD-10-CM | POA: Diagnosis not present

## 2022-05-03 DIAGNOSIS — Z8679 Personal history of other diseases of the circulatory system: Secondary | ICD-10-CM

## 2022-05-03 NOTE — Progress Notes (Signed)
PATIENT: Vincent Ferrell DOB: June 27, 1961  REASON FOR VISIT: follow up HISTORY FROM: patient PRIMARY NEUROLOGIST: Dr. Leonie Man  Chief Complaint  Patient presents with   Follow-up    Pt  in 19 pt here for stroke f/u   Pt states he has left side weakness . Pt wants to know when he can drive again Pt has questions about medications.       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  Still has left sided weakness. No longer using an assistive device to walk. Left arm and hand is still weak but working with OT. Reports minimal movements in fingers. Left facial droop. No trouble chewing or swallowing. Did therapy at [facility] and did swallowing test there. No longer has PEG tube. Currently at [facility]. Now able to complete all ADLs independently. Looking for another home to live in without steps. Current home has a lot of steps.   Reports that developed at temp 103 at [facility]- when to [facility] hospital. While there they found a blood clot in the lung. He was placed on eliquis.   Had follow up with ID several weeks ago. He was discharged from ID care. Will be repeating ECHO   HISTORY Vincent Ferrell is a 61 y.o. male with history of hypertension, hyperlipidemia, recent pneumonia and malaise admitted for right gaze, left-sided weakness, left facial droop and left neglect. No tPA given due to concerning for endocarditis.  TEE confirmed endocarditis. ID on board. Colonsocopy completed and negative. Follow Hg levels, CTA if further bleeding.  MRI shows subacute ischemia in the right MCA, right occipital lobe, left frontal and parietal white matter and petechial blood in the left parietal lobe.  Trach placed 4/24, PEG placed 4/25  Stroke:  acute moderate right MCA and punctate left cerebellum infarcts and subacute small right occipital and left MCA infarcts, embolic pattern due to endocarditis CT  subacute bilateral occipital, left MCA small infarcts. CT head and neck right M2 occlusion CTP 40/72 right MCA penumbra Status post IR with TICI3 MRI acute moderate right MCA and punctate left cerebellum infarcts and subacute small right occipital and left MCA infarcts Repeat MRI- Expected evolution of subacute ischemia in the right MCA territory, right occipital lobe, and left frontal and parietal white matter.  Petechial blood in the left parietal lobe MRA right M2 recannulized 2D Echo EF 55 to 60% TEE: MV Endocarditis. LDL 37.7 HgbA1c 6.0 UDS neg No antithrombotic prior to admission, now on No antithrombotic given endocarditis is the cause of stroke Ongoing aggressive stroke risk factor management     REVIEW OF SYSTEMS: Out of a complete 14 system review of symptoms, the patient complains only of the following symptoms, and all other reviewed systems are negative.  ALLERGIES: Allergies  Allergen Reactions   Pollen Extract Other (See Comments)    Sneezing     HOME MEDICATIONS: Outpatient Medications Prior to Visit  Medication Sig Dispense Refill   atorvastatin (LIPITOR) 40 MG tablet Place 1 tablet (40 mg total) into feeding tube daily. 30 tablet 0   Chlorhexidine Gluconate Cloth 2 % PADS Apply 6 each topically daily.     chlorhexidine gluconate, MEDLINE KIT, (PERIDEX) 0.12 % solution 15 mLs by Mouth Rinse route 2 (two) times daily. 120 mL 0   clonazePAM (KLONOPIN) 0.25 MG disintegrating tablet Place 1 tablet (0.25 mg total) into feeding tube 2 (two) times daily as  needed (anxiety). 60 tablet 0   fiber (NUTRISOURCE FIBER) PACK packet Place 1 packet into feeding tube 2 (two) times daily.     guaiFENesin (ROBITUSSIN) 100 MG/5ML liquid Place 15 mLs into feeding tube every 6 (six) hours. 120 mL 0   HYDROcodone-acetaminophen (NORCO/VICODIN) 5-325 MG tablet Place 1-2 tablets into feeding tube every 6 (six) hours as needed for moderate pain or severe pain. 30 tablet 0   lisinopril  (ZESTRIL) 10 MG tablet Place 1 tablet (10 mg total) into feeding tube daily.     Nutritional Supplements (FEEDING SUPPLEMENT, JEVITY 1.5 CAL/FIBER,) LIQD Place 1,000 mLs into feeding tube continuous.     Nutritional Supplements (FEEDING SUPPLEMENT, PROSOURCE TF,) liquid Place 90 mLs into feeding tube 2 (two) times daily.     Nystatin (GERHARDT'S BUTT CREAM) CREA Apply 1 application. topically 4 (four) times daily.     pantoprazole sodium (PROTONIX) 40 mg Place 40 mg into feeding tube 2 (two) times daily.     QUEtiapine (SEROQUEL) 25 MG tablet 0.5 tablets (12.5 mg total) by Per NG tube route at bedtime.     Water For Irrigation, Sterile (FREE WATER) SOLN Place 200 mLs into feeding tube every 8 (eight) hours.     No facility-administered medications prior to visit.    PAST MEDICAL HISTORY: Past Medical History:  Diagnosis Date   Chest pain in adult 10/24/2017   Elevated BP without diagnosis of hypertension 10/24/2017   Impaired fasting glucose 10/24/2017   Mixed hyperlipidemia 10/24/2017   Multifocal pneumonia 12/30/2021   Rising PSA level 10/24/2017    PAST SURGICAL HISTORY: Past Surgical History:  Procedure Laterality Date   COLONOSCOPY WITH PROPOFOL N/A 12/28/2021   Procedure: COLONOSCOPY WITH PROPOFOL;  Surgeon: Thornton Park, MD;  Location: [place];  Service: Gastroenterology;  Laterality: N/A;   COLONOSCOPY WITH PROPOFOL N/A 12/29/2021   Procedure: COLONOSCOPY WITH PROPOFOL;  Surgeon: Thornton Park, MD;  Location: [place];  Service: Gastroenterology;  Laterality: N/A;   ESOPHAGOGASTRODUODENOSCOPY (EGD) WITH PROPOFOL N/A 12/29/2021   Procedure: ESOPHAGOGASTRODUODENOSCOPY (EGD) WITH PROPOFOL;  Surgeon: Thornton Park, MD;  Location: [place];  Service: Gastroenterology;  Laterality: N/A;   ESOPHAGOGASTRODUODENOSCOPY (EGD) WITH PROPOFOL  01/05/2022   Procedure: ESOPHAGOGASTRODUODENOSCOPY (EGD) WITH PROPOFOL;  Surgeon: Jesusita Oka, MD;  Location: [place];   Service: General;;   IR CT HEAD LTD  12/25/2021   IR PERCUTANEOUS ART THROMBECTOMY/INFUSION INTRACRANIAL INC DIAG ANGIO  12/25/2021   PEG PLACEMENT N/A 01/05/2022   Procedure: PERCUTANEOUS ENDOSCOPIC GASTROSTOMY (PEG) PLACEMENT;  Surgeon: Jesusita Oka, MD;  Location: [place];  Service: General;  Laterality: N/A;   RADIOLOGY WITH ANESTHESIA N/A 12/25/2021   Procedure: IR WITH ANESTHESIA;  Surgeon: Radiologist, Medication, MD;  Location: [place];  Service: Radiology;  Laterality: N/A;   TRANSURETHRAL RESECTION OF PROSTATE N/A 12/29/2021   Procedure: TRANSURETHRAL RESECTION OF THE PROSTATE (TURP);  Surgeon: Lucas Mallow, MD;  Location: [place];  Service: Urology;  Laterality: N/A;    FAMILY HISTORY: No family history on file.  SOCIAL HISTORY: Social History   Socioeconomic History   Marital status: Unknown    Spouse name: Not on file   Number of children: Not on file   Years of education: Not on file   Highest education level: Not on file  Occupational History   Not on file  Tobacco Use   Smoking status: Not on file   Smokeless tobacco: Not on file  Substance and Sexual Activity   Alcohol use: Not on  file   Drug use: Not on file   Sexual activity: Not on file  Other Topics Concern   Not on file  Social History Narrative   Not on file   Social Determinants of Health   Financial Resource Strain: Not on file  Food Insecurity: Not on file  Transportation Needs: Not on file  Physical Activity: Not on file  Stress: Not on file  Social Connections: Not on file  Intimate Partner Violence: Not on file      PHYSICAL EXAM  Vitals:   05/03/22 0905  BP: 114/75  Pulse: 64  Weight: 188 lb (85.3 kg)  Height: 6' (1.829 m)   Body mass index is 25.5 kg/m.  Generalized: Well developed, in no acute distress   Neurological examination  Mentation: Alert oriented to time, place, history taking. Follows all commands speech and language fluent Cranial nerve II-XII: Pupils  were equal round reactive to light. Extraocular movements were full, visual field were full on confrontational test. Left facial droop.  Uvula tongue midline. Head turning and shoulder shrug  were normal and symmetric. Motor: The motor testing reveals 5 over 5 strength RUE, RLE. 3/5 in LUE- minimal movement in hand. 4/5 LLE. Sensory: Sensory testing is intact to soft touch on all 4 extremities. No evidence of extinction is noted.  Coordination: Cerebellar testing reveals good finger-nose-finger on the right. Unable to complete on the left d/t weakness. Good  heel-to-shin bilaterally.  Gait and station: Gait is normal.    DIAGNOSTIC DATA (LABS, IMAGING, TESTING) - I reviewed patient records, labs, notes, testing and imaging myself where available.  Lab Results  Component Value Date   WBC 5.2 02/08/2022   HGB 11.9 (L) 02/08/2022   HCT 37.6 (L) 02/08/2022   MCV 95.9 02/08/2022   PLT 259 02/08/2022      Component Value Date/Time   NA 138 02/08/2022 0218   K 3.6 02/08/2022 0218   CL 105 02/08/2022 0218   CO2 23 02/08/2022 0218   GLUCOSE 122 (H) 02/08/2022 0218   BUN 19 02/08/2022 0218   CREATININE 0.99 02/08/2022 0218   CALCIUM 9.0 02/08/2022 0218   PROT 6.3 (L) 02/08/2022 0218   ALBUMIN 2.1 (L) 02/08/2022 0218   AST 25 02/08/2022 0218   ALT 24 02/08/2022 0218   ALKPHOS 80 02/08/2022 0218   BILITOT 1.4 (H) 02/08/2022 0218   GFRNONAA >60 02/08/2022 0218   Lab Results  Component Value Date   CHOL 89 12/26/2021   HDL <10 (L) 12/26/2021   LDLCALC NOT CALCULATED 12/26/2021   LDLDIRECT 37.7 12/26/2021   TRIG 99 01/07/2022   CHOLHDL NOT CALCULATED 12/26/2021   Lab Results  Component Value Date   HGBA1C 6.0 (H) 12/25/2021   No results found for: "VITAMINB12" Lab Results  Component Value Date   TSH 3.362 01/16/2022      ASSESSMENT AND PLAN 61 y.o. year old male  has a past medical history of Chest pain in adult (10/24/2017), Elevated BP without diagnosis of hypertension  (10/24/2017), Impaired fasting glucose (10/24/2017), Mixed hyperlipidemia (10/24/2017), Multifocal pneumonia (12/30/2021), and Rising PSA level (10/24/2017). here with:   Stroke:  acute moderate right MCA and punctate left cerebellum infarcts and subacute small right occipital and left MCA infarcts, embolic pattern due to endocarditis   Residual deficit: Left sided weakness in the LUE, left facial droop.  No antithrombotic therapy needed due to cause  been endocarditis.  However patient is currently on Eliquis due to pulmonary embolus.  Discussed secondary  stroke prevention measures and importance of close PCP follow up for aggressive stroke risk factor management. I have gone over the pathophysiology of stroke, warning signs and symptoms, risk factors and their management in some detail with instructions to go to the closest emergency room for symptoms of concern. HTN: BP goal <130/90.   HLD: LDL goal <70. Recent LDL 37.  DMII: A1c goal<7.0. Recent A1c 6.  Encouraged patient to monitor diet and encouraged exercise Patient asking about driving concern is with the left upper extremity weakness.  Give the patient resources for driving rehab.  Encouraged him to complete driver's rehab before we can make a decision about driving FU with our office 6 months or sooner if needed       Ward Givens, MSN, NP-C 05/03/2022, 8:54 AM [facility] [address], [place], [place] [address] [phone]

## 2022-05-03 NOTE — Patient Instructions (Signed)
Stroke Risk Factors:  Blood Pressure: Keep pressure <130/90 Cholesterol LDL <70 Diabetes A1c goal <3.0    Local Driver Evaluation Programs:     Comprehensive Evaluation: includes clinical and in vehicle behind the wheel testing by OCCUPATIONAL THERAPIST. Programs have varying levels of adaptive controls available for trial.     [employer], [place]  [address]  [place], [place]  [address]  [phone] or [phone]  [email or web address]  Evaluator:  Elvera Lennox, OT/CDRS/CDI/SCDCM/Low Vision Certification     [facility]  [address] [place], [place] [address]  [phone]  [email or web address]  Evaluators:  Rockwell Alexandria, OT and Yves Dill, OT     [facility] (ONLY SERVES VETERANS!!)  [facility]  [address]  [place], [place]  [address]  [phone]  [email or web address]  Evaluators:  Randall Hiss, KT; Rayburn Felt, KT;  Sheran Luz, KT (KT=kiniesotherapist)        Clinical evaluations only:  Includes clinical testing, refers to other programs or local certified driving instructor for behind the wheel testing.     [facility] (outpatient Rehab)  [facility]  [address]  [place], [place] [address]  [phone] for scheduling  [email or web address]  Evaluators:  Steward Drone, OT; Delma Post, OT     Other area clinical evaluators available upon request including [facility], [facility] and [facility].

## 2022-06-03 DIAGNOSIS — Z0289 Encounter for other administrative examinations: Secondary | ICD-10-CM

## 2022-06-07 ENCOUNTER — Telehealth: Payer: Self-pay

## 2022-06-07 NOTE — Telephone Encounter (Signed)
There is a note from [facility] regarding pts disability from [facility] 06/02/22. Can this be looked into to make sure its not the same disability forms?

## 2022-06-08 NOTE — Telephone Encounter (Signed)
Called pt left message to call the office concerning his FMLA Thanks

## 2022-06-24 NOTE — Telephone Encounter (Signed)
Signed form placed in Medical Records.

## 2022-06-28 ENCOUNTER — Telehealth: Payer: Self-pay | Admitting: *Deleted

## 2022-06-28 NOTE — Telephone Encounter (Signed)
Pt [place] form faxed on 06/28/2022

## 2022-11-15 ENCOUNTER — Encounter: Payer: Self-pay | Admitting: Adult Health

## 2022-11-15 ENCOUNTER — Telehealth: Payer: Self-pay | Admitting: Adult Health

## 2022-11-15 ENCOUNTER — Ambulatory Visit (INDEPENDENT_AMBULATORY_CARE_PROVIDER_SITE_OTHER): Payer: Managed Care, Other (non HMO) | Admitting: Adult Health

## 2022-11-15 VITALS — BP 141/78 | HR 66 | Ht 72.0 in | Wt 189.0 lb

## 2022-11-15 DIAGNOSIS — I63411 Cerebral infarction due to embolism of right middle cerebral artery: Secondary | ICD-10-CM | POA: Diagnosis not present

## 2022-11-15 DIAGNOSIS — Z8679 Personal history of other diseases of the circulatory system: Secondary | ICD-10-CM | POA: Diagnosis not present

## 2022-11-15 NOTE — Progress Notes (Signed)
PATIENT: Vincent Ferrell DOB: Oct 14, 1960  REASON FOR VISIT: follow up HISTORY FROM: patient PRIMARY NEUROLOGIST: Dr. Leonie Man  Chief Complaint  Patient presents with   Follow-up    Rm 19, stroke f/u. Questions per therapist.  Has decreased sensation L side.  L shoulder frozen? Taking baclofen. Is on no blood thinner due to endocarditis.  He is driving now.        HISTORY OF PRESENT ILLNESS: Today 11/15/22:  Vincent Ferrell is a 62 y.o. 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 Returns today for follow-up.  Continues to have left sided weakness arm > leg. Working with  Occupational therapy. Did driving rehab and has been cleared to drive. Reports sensation changes at the left shoulder. Was on Eliquis due to PE but is now off medication. Had a follow-up with Cardiology but missed his appointment because he was in the hospital. PCP managing other risk factors.     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  Still has left sided weakness. No longer using an assistive device to walk. Left arm and hand is still weak but working with OT. Reports minimal movements in fingers. Left facial droop. No trouble chewing or swallowing. Did therapy at [facility] and did swallowing test there. No longer has PEG tube. Currently at [place] living. Now able to complete all ADLs independently. Looking for another home to live in without steps. Current home has a lot of steps.   Reports that developed at temp 103 at [facility]- when to [facility] hospital. While there they found a blood clot in the lung. He was placed on eliquis.   Had follow up with ID several weeks ago. He was discharged from ID care. Will be repeating ECHO   HISTORY Vincent Ferrell is a 62 y.o. male with history of hypertension, hyperlipidemia, recent pneumonia and malaise admitted for right gaze, left-sided weakness, left facial droop and left neglect. No tPA given due to  concerning for endocarditis.  TEE confirmed endocarditis. ID on board. Colonsocopy completed and negative. Follow Hg levels, CTA if further bleeding.  MRI shows subacute ischemia in the right MCA, right occipital lobe, left frontal and parietal white matter and petechial blood in the left parietal lobe.  Trach placed 4/24, PEG placed 4/25  Stroke:  acute moderate right MCA and punctate left cerebellum infarcts and subacute small right occipital and left MCA infarcts, embolic pattern due to endocarditis CT subacute bilateral occipital, left MCA small infarcts. CT head and neck right M2 occlusion CTP 40/72 right MCA penumbra Status post IR with TICI3 MRI acute moderate right MCA and punctate left cerebellum infarcts and subacute small right occipital and left MCA infarcts Repeat MRI- Expected evolution of subacute ischemia in the right MCA territory, right occipital lobe, and left frontal and parietal white matter.  Petechial blood in the left parietal lobe MRA right M2 recannulized 2D Echo EF 55 to 60% TEE: MV Endocarditis. LDL 37.7 HgbA1c 6.0 UDS neg No antithrombotic prior to admission, now on No antithrombotic given endocarditis is the cause of stroke Ongoing aggressive stroke risk factor management     REVIEW OF SYSTEMS: Out of a complete 14 system review of symptoms, the patient complains only of the following symptoms, and all other reviewed systems are negative.  ALLERGIES: Allergies  Allergen Reactions   Pollen Extract Other (See Comments)    Sneezing     HOME MEDICATIONS: Outpatient Medications Prior to  Visit  Medication Sig Dispense Refill   ascorbic acid (VITAMIN C) 500 MG tablet Take by mouth.     atorvastatin (LIPITOR) 40 MG tablet Place 1 tablet (40 mg total) into feeding tube daily. 30 tablet 0   Baclofen 5 MG TABS Take 5 mg by mouth 3 (three) times daily.     diclofenac Sodium (VOLTAREN ARTHRITIS PAIN) 1 % GEL Apply topically daily.     gabapentin (NEURONTIN) 100 MG  capsule Take 100 mg by mouth 3 (three) times daily.     Lactobacillus-Inulin (CULTURELLE DIGESTIVE DAILY PO) Take by mouth daily.     lisinopril (ZESTRIL) 10 MG tablet Place 1 tablet (10 mg total) into feeding tube daily.     MELATONIN PO Take by mouth as needed.     metoprolol tartrate (LOPRESSOR) 25 MG tablet Take 25 mg by mouth 2 (two) times daily.     Multiple Vitamin (MULTI-VITAMIN) tablet Take 1 tablet by mouth daily.     pantoprazole sodium (PROTONIX) 40 mg Place 40 mg into feeding tube 2 (two) times daily.     Sennosides (SENOKOT PO) Take by mouth daily at 2 PM.     tamsulosin (FLOMAX) 0.4 MG CAPS capsule Take by mouth.     APIXABAN (ELIQUIS) VTE STARTER PACK ('10MG'$  AND '5MG'$ ) Take 2 tablets ('10mg'$ ) by mouth twice daily for 7 days (03/08/22 - 03/14/22), then take 1 tablet (5 mg) twice daily starting 03/15/22     Chlorhexidine Gluconate Cloth 2 % PADS Apply 6 each topically daily.     chlorhexidine gluconate, MEDLINE KIT, (PERIDEX) 0.12 % solution 15 mLs by Mouth Rinse route 2 (two) times daily. 120 mL 0   clonazePAM (KLONOPIN) 0.25 MG disintegrating tablet Place 1 tablet (0.25 mg total) into feeding tube 2 (two) times daily as needed (anxiety). 60 tablet 0   ferrous sulfate 325 (65 FE) MG tablet Take by mouth.     fiber (NUTRISOURCE FIBER) PACK packet Place 1 packet into feeding tube 2 (two) times daily.     fluticasone furoate-vilanterol (BREO ELLIPTA) 100-25 MCG/ACT AEPB Inhale into the lungs.     guaiFENesin (ROBITUSSIN) 100 MG/5ML liquid Place 15 mLs into feeding tube every 6 (six) hours. 120 mL 0   HYDROcodone-acetaminophen (NORCO/VICODIN) 5-325 MG tablet Place 1-2 tablets into feeding tube every 6 (six) hours as needed for moderate pain or severe pain. 30 tablet 0   Nutritional Supplements (FEEDING SUPPLEMENT, JEVITY 1.5 CAL/FIBER,) LIQD Place 1,000 mLs into feeding tube continuous.     Nutritional Supplements (FEEDING SUPPLEMENT, PROSOURCE TF,) liquid Place 90 mLs into feeding tube 2 (two)  times daily.     Nystatin (GERHARDT'S BUTT CREAM) CREA Apply 1 application. topically 4 (four) times daily.     QUEtiapine (SEROQUEL) 25 MG tablet 0.5 tablets (12.5 mg total) by Per NG tube route at bedtime.     Water For Irrigation, Sterile (FREE WATER) SOLN Place 200 mLs into feeding tube every 8 (eight) hours.     No facility-administered medications prior to visit.    PAST MEDICAL HISTORY: Past Medical History:  Diagnosis Date   Chest pain in adult 10/24/2017   Elevated BP without diagnosis of hypertension 10/24/2017   Impaired fasting glucose 10/24/2017   Mixed hyperlipidemia 10/24/2017   Multifocal pneumonia 12/30/2021   Rising PSA level 10/24/2017    PAST SURGICAL HISTORY: Past Surgical History:  Procedure Laterality Date   COLONOSCOPY WITH PROPOFOL N/A 12/28/2021   Procedure: COLONOSCOPY WITH PROPOFOL;  Surgeon: Thornton Park, MD;  Location: [facility];  Service: Gastroenterology;  Laterality: N/A;   COLONOSCOPY WITH PROPOFOL N/A 12/29/2021   Procedure: COLONOSCOPY WITH PROPOFOL;  Surgeon: Thornton Park, MD;  Location: [facility];  Service: Gastroenterology;  Laterality: N/A;   ESOPHAGOGASTRODUODENOSCOPY (EGD) WITH PROPOFOL N/A 12/29/2021   Procedure: ESOPHAGOGASTRODUODENOSCOPY (EGD) WITH PROPOFOL;  Surgeon: Thornton Park, MD;  Location: [facility];  Service: Gastroenterology;  Laterality: N/A;   ESOPHAGOGASTRODUODENOSCOPY (EGD) WITH PROPOFOL  01/05/2022   Procedure: ESOPHAGOGASTRODUODENOSCOPY (EGD) WITH PROPOFOL;  Surgeon: Jesusita Oka, MD;  Location: [facility];  Service: General;;   IR CT HEAD LTD  12/25/2021   IR PERCUTANEOUS ART THROMBECTOMY/INFUSION INTRACRANIAL INC DIAG ANGIO  12/25/2021   PEG PLACEMENT N/A 01/05/2022   Procedure: PERCUTANEOUS ENDOSCOPIC GASTROSTOMY (PEG) PLACEMENT;  Surgeon: Jesusita Oka, MD;  Location: [facility];  Service: General;  Laterality: N/A;   RADIOLOGY WITH ANESTHESIA N/A 12/25/2021   Procedure: IR WITH ANESTHESIA;   Surgeon: Radiologist, Medication, MD;  Location: [facility];  Service: Radiology;  Laterality: N/A;   TRANSURETHRAL RESECTION OF PROSTATE N/A 12/29/2021   Procedure: TRANSURETHRAL RESECTION OF THE PROSTATE (TURP);  Surgeon: Lucas Mallow, MD;  Location: [facility];  Service: Urology;  Laterality: N/A;    FAMILY HISTORY: Family History  Problem Relation Age of Onset   Stroke Neg Hx     SOCIAL HISTORY: Social History   Socioeconomic History   Marital status: Unknown    Spouse name: Not on file   Number of children: Not on file   Years of education: Not on file   Highest education level: Not on file  Occupational History   Not on file  Tobacco Use   Smoking status: Never   Smokeless tobacco: Never  Substance and Sexual Activity   Alcohol use: Yes    Comment: occ   Drug use: Not on file   Sexual activity: Not on file  Other Topics Concern   Not on file  Social History Narrative   Not on file   Social Determinants of Health   Financial Resource Strain: Not on file  Food Insecurity: Not on file  Transportation Needs: Not on file  Physical Activity: Not on file  Stress: Not on file  Social Connections: Not on file  Intimate Partner Violence: Not on file      PHYSICAL EXAM  Vitals:   11/15/22 1109  BP: (!) 141/78  Pulse: 66  Weight: 189 lb (85.7 kg)  Height: 6' (1.829 m)   Body mass index is 25.63 kg/m.  Generalized: Well developed, in no acute distress   Neurological examination  Mentation: Alert oriented to time, place, history taking. Follows all commands speech and language fluent Cranial nerve II-XII: Pupils were equal round reactive to light. Extraocular movements were full, visual field were full on confrontational test. Left facial droop.  Uvula tongue midline. Head turning and shoulder shrug  were normal and symmetric. Motor: The motor testing reveals 5 over 5 strength RUE, RLE. 3/5 in LUE- minimal movement in hand. 4/5 LLE. Sensory: Sensory testing is  intact to soft touch on all 4 extremities. No evidence of extinction is noted.  Coordination: Cerebellar testing reveals good finger-nose-finger on the right. Unable to complete on the left d/t weakness. Good  heel-to-shin bilaterally.  Gait and station: Gait is normal.    DIAGNOSTIC DATA (LABS, IMAGING, TESTING) - I reviewed patient records, labs, notes, testing and imaging myself where available.  Lab Results  Component Value Date   WBC 5.2 02/08/2022  HGB 11.9 (L) 02/08/2022   HCT 37.6 (L) 02/08/2022   MCV 95.9 02/08/2022   PLT 259 02/08/2022      Component Value Date/Time   NA 138 02/08/2022 0218   K 3.6 02/08/2022 0218   CL 105 02/08/2022 0218   CO2 23 02/08/2022 0218   GLUCOSE 122 (H) 02/08/2022 0218   BUN 19 02/08/2022 0218   CREATININE 0.99 02/08/2022 0218   CALCIUM 9.0 02/08/2022 0218   PROT 6.3 (L) 02/08/2022 0218   ALBUMIN 2.1 (L) 02/08/2022 0218   AST 25 02/08/2022 0218   ALT 24 02/08/2022 0218   ALKPHOS 80 02/08/2022 0218   BILITOT 1.4 (H) 02/08/2022 0218   GFRNONAA >60 02/08/2022 0218   Lab Results  Component Value Date   CHOL 89 12/26/2021   HDL <10 (L) 12/26/2021   LDLCALC NOT CALCULATED 12/26/2021   LDLDIRECT 37.7 12/26/2021   TRIG 99 01/07/2022   CHOLHDL NOT CALCULATED 12/26/2021   Lab Results  Component Value Date   HGBA1C 6.0 (H) 12/25/2021   No results found for: "VITAMINB12" Lab Results  Component Value Date   TSH 3.362 01/16/2022      ASSESSMENT AND PLAN 62 y.o. year old male  has a past medical history of Chest pain in adult (10/24/2017), Elevated BP without diagnosis of hypertension (10/24/2017), Impaired fasting glucose (10/24/2017), Mixed hyperlipidemia (10/24/2017), Multifocal pneumonia (12/30/2021), and Rising PSA level (10/24/2017). here with:   Stroke:  acute moderate right MCA and punctate left cerebellum infarcts and subacute small right occipital and left MCA infarcts, embolic pattern due to endocarditis   Residual deficit:  Left sided weakness in the LUE, left facial droop.  Currently not on any antithrombotic therapy needed due to cause being endocarditis.   Discussed secondary stroke prevention measures and importance of close PCP follow up for aggressive stroke risk factor management. I have gone over the pathophysiology of stroke, warning signs and symptoms, risk factors and their management in some detail with instructions to go to the closest emergency room for symptoms of concern. HTN: BP goal <130/90.   HLD: LDL goal <70. Recent LDL 37.  DMII: A1c goal<7.0. Recent A1c 6.  Referral to cardiology Encouraged patient to monitor diet and encouraged exercise Advised we could refer to physical medicine but advises that he will discuss with OT FU with our office 6 months or sooner if needed       Ward Givens, MSN, NP-C 11/15/2022, 11:23 AM [facility] [address] [place], [place] [address] [phone]

## 2022-11-15 NOTE — Telephone Encounter (Signed)
Referral for cardiology fax to [facility]. Phone: [phone], Fax: [phone].

## 2022-11-16 ENCOUNTER — Encounter: Payer: Self-pay | Admitting: Adult Health

## 2022-11-18 ENCOUNTER — Encounter: Payer: Self-pay | Admitting: Adult Health

## 2022-11-18 DIAGNOSIS — I63411 Cerebral infarction due to embolism of right middle cerebral artery: Secondary | ICD-10-CM

## 2022-11-18 DIAGNOSIS — M25512 Pain in left shoulder: Secondary | ICD-10-CM

## 2022-11-23 ENCOUNTER — Telehealth: Payer: Self-pay | Admitting: Adult Health

## 2022-11-23 NOTE — Telephone Encounter (Signed)
Referral for physical therapy sent through [facility] to [facility]. [phone] 

## 2022-12-14 ENCOUNTER — Ambulatory Visit: Payer: Managed Care, Other (non HMO) | Admitting: Physical Therapy

## 2023-01-27 ENCOUNTER — Telehealth: Payer: Self-pay | Admitting: Adult Health

## 2023-01-27 NOTE — Telephone Encounter (Signed)
From last office note here:

## 2023-01-27 NOTE — Telephone Encounter (Signed)
I spoke with the patient.  He states after we provided him with the driver rehab information last year, he went through testing and was cleared to drive but they did want him to install a knob on the steering wheel which he has done.  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 He is still having difficulty with his fine motor skills in his left hand and cannot do the same job that he used to.  He will be returning to work for the next couple of weeks and then retiring.  He would like to be cleared to return to work with restrictions from performing job functions that involve fine motor skills of the L hand. He also mentioned he has applied for social security disability.

## 2023-01-27 NOTE — Telephone Encounter (Signed)
Pt is needing a letter from the provider stating that the pt is ok to drive and go back to work. Please advise.

## 2023-01-27 NOTE — Telephone Encounter (Signed)
We need to see what kind of work the patient does.

## 2023-01-27 NOTE — Telephone Encounter (Signed)
That's fine to provide letter

## 2023-01-30 ENCOUNTER — Encounter: Payer: Self-pay | Admitting: Adult Health

## 2023-01-31 ENCOUNTER — Encounter: Payer: Self-pay | Admitting: *Deleted

## 2023-01-31 NOTE — Telephone Encounter (Signed)
Letter written and placed on Vincent Ferrell's desk for signature.

## 2023-03-01 DIAGNOSIS — Z0271 Encounter for disability determination: Secondary | ICD-10-CM

## 2023-06-15 NOTE — Therapy (Signed)
OUTPATIENT OCCUPATIONAL THERAPY VIVISTIM EVALUATION  Patient Name: Abhimanyu Cruces MRN: [id number] DOB:14-Jan-1961, 62 y.o., male Today's Date: 06/16/2023  PCP: Toy Care, DO REFERRING PROVIDER: Toy Care, DO   OT End of Session - 06/16/23 1439     Visit Number 1    Number of Visits 9    Date for OT Re-Evaluation 08/26/23    Authorization Type [employer] - 90 OT, PT, ST VL combined    OT Start Time 1454    OT Stop Time 1539    OT Time Calculation (min) 45 min    Activity Tolerance Patient tolerated treatment well    Behavior During Therapy [facility] for tasks assessed/performed            Past Medical History:  Diagnosis Date   Chest pain in adult 10/24/2017   Elevated BP without diagnosis of hypertension 10/24/2017   Impaired fasting glucose 10/24/2017   Mixed hyperlipidemia 10/24/2017   Multifocal pneumonia 12/30/2021   Rising PSA level 10/24/2017   Past Surgical History:  Procedure Laterality Date   COLONOSCOPY WITH PROPOFOL N/A 12/28/2021   Procedure: COLONOSCOPY WITH PROPOFOL;  Surgeon: Tressia Danas, MD;  Location: [facility] ENDOSCOPY;  Service: Gastroenterology;  Laterality: N/A;   COLONOSCOPY WITH PROPOFOL N/A 12/29/2021   Procedure: COLONOSCOPY WITH PROPOFOL;  Surgeon: Tressia Danas, MD;  Location: [facility] ENDOSCOPY;  Service: Gastroenterology;  Laterality: N/A;   ESOPHAGOGASTRODUODENOSCOPY (EGD) WITH PROPOFOL N/A 12/29/2021   Procedure: ESOPHAGOGASTRODUODENOSCOPY (EGD) WITH PROPOFOL;  Surgeon: Tressia Danas, MD;  Location: [facility] ENDOSCOPY;  Service: Gastroenterology;  Laterality: N/A;   ESOPHAGOGASTRODUODENOSCOPY (EGD) WITH PROPOFOL  01/05/2022   Procedure: ESOPHAGOGASTRODUODENOSCOPY (EGD) WITH PROPOFOL;  Surgeon: Diamantina Monks, MD;  Location: [facility] ENDOSCOPY;  Service: General;;   IR CT HEAD LTD  12/25/2021   IR PERCUTANEOUS ART THROMBECTOMY/INFUSION INTRACRANIAL INC DIAG ANGIO  12/25/2021   PEG PLACEMENT N/A 01/05/2022   Procedure: PERCUTANEOUS ENDOSCOPIC GASTROSTOMY (PEG)  PLACEMENT;  Surgeon: Diamantina Monks, MD;  Location: [facility] ENDOSCOPY;  Service: General;  Laterality: N/A;   RADIOLOGY WITH ANESTHESIA N/A 12/25/2021   Procedure: IR WITH ANESTHESIA;  Surgeon: Radiologist, Medication, MD;  Location: [facility] OR;  Service: Radiology;  Laterality: N/A;   TRANSURETHRAL RESECTION OF PROSTATE N/A 12/29/2021   Procedure: TRANSURETHRAL RESECTION OF THE PROSTATE (TURP);  Surgeon: Crista Elliot, MD;  Location: [facility] OR;  Service: Urology;  Laterality: N/A;   Patient Active Problem List   Diagnosis Date Noted   Encephalopathy acute 01/07/2022   Severe protein-calorie malnutrition (HCC) 01/07/2022   Physical deconditioning 01/07/2022   Diarrhea 01/07/2022   Pericardial effusion    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   Prostate abscess 12/30/2021   Acute kidney injury (HCC) 12/30/2021   Anemia due to GI blood loss    Acute respiratory failure with hypoxia (HCC)    Acute ischemic right MCA stroke (HCC) 12/25/2021   Sinus tachycardia    Chest pain in adult 10/24/2017   Mixed hyperlipidemia 10/24/2017   Rising PSA level 10/24/2017   Impaired fasting glucose 10/24/2017   Elevated BP without diagnosis of hypertension 10/24/2017    Rationale for Evaluation and Treatment: Rehabilitation  ONSET DATE: 06/08/2023 (date of referral)  REFERRING DIAG: I69.30 (ICD-10-CM) - Unspecified sequelae of cerebral infarction R53.1 (ICD-10-CM) - Weakness  THERAPY DIAG:  Muscle weakness (generalized)  Other lack of coordination  Other disturbances of skin sensation  Other symptoms and signs involving the nervous system  SUBJECTIVE:   SUBJECTIVE STATEMENT: Pt reports he is a little hesitant  to have Vivistim implanted or Botox injections, but he is eager to improve the function of his LUE.   Pt accompanied by: self  PERTINENT HISTORY: PMH: endocarditis, HTN, HLD  MRI from April, 2024 showed subacute ischemia in the right MCA, right occipital lobe, left frontal and parietal  white matter and petechial blood in the left parietal lobe   PRECAUTIONS: None  WEIGHT BEARING RESTRICTIONS No  PAIN:  Are you having pain? Yes: NPRS scale: 1/10 Pain location: L hand Pain description: sore, stiff  FALLS: Has patient fallen in last 6 months? No  LIVING ENVIRONMENT: Lives with: lives alone Lives in: House/apartment Stairs: No Has following equipment at home: None  PLOF: Independent; retired; driving  PATIENT GOALS Improve use of LUE  OBJECTIVE:   HAND DOMINANCE: Right  SENSATION: Occasional paresthesias reported  MUSCLE TONE: LUE: Mild and Hypertonic ; uses Baclofen and heating pad  COGNITION: Overall cognitive status: Within functional limits for tasks assessed  PERCEPTION: Not tested  PRAXIS: Not tested  --------------------------------------------------------------------------------------------------------------------- [name]: IADL Scale  A. Ability to Use Telephone  Operates telephone on own initiative-looks up and dials numbers, etc. - 1  B. Shopping Takes care of all shopping needs independently - 1  C. Food Preparation Plans, prepares and serves adequate meals independently - 1  D. Housekeeping Performs light daily tasks such as dish washing, bed making - 1  E. Laundry  Does Engineer, manufacturing - 1  F. Mode of Transportation  Travels independently on public transportation or drives own car - 1  G. Responsibility for Own Medications Is responsible for taking medication in correct dosages at correct time - 1  H. Ability to [employer] financial matters independently (budgets, writes checks, pays rent, bills, goes to [employer]), collects and keeps track of income - 1    TOTAL LAWTON BRODY SCORE: 8/8  -----------------------------------------------------------  Myrtis Ser Index of Independence in ADL INDEPENDENCE (1) DEPENDENCE (0)  NO supervision, direction or personal assistance WITH supervision, direction, personal  assistance or total care   ACTIVITIES 1 or 0  Bathing 1  Dressing 1  Toileting 1  Transferring 1  Continence 1  Feeding 0    KATZ TOTAL: 5/6  -----------------------------------------------------------  FUGL-MEYER ASSESSMENT   A. Upper Extremity  I. Reflex Activity: Flexors (elbow) 2  Extensors (triceps) 2  Total 4/4     II. Volitional Movement within Synergies: Flexor Synergy:   Shoulder Retraction 1  Shoulder Elevation 1  Shoulder Abduction 1  External Rotation 1  Elbow Flexion 1  Forearm Supination 1  Extensor Synergy:   Shoulder Adduction/Internal Rotation 1  Elbow Extension 2  Forearm Pronation 2  Total 11/18   III. Volitional Mixing of Synergies: Hand to Lumbar Spine 1  Shoulder Flexion 1  Pronation/Supination 1  Total 3/6   IV. Volitional Movement with Little to No Synergy Shoulder Abduction 1  Shoulder Flexion 90+ 1  Pronation/Supination 1  Total 3/6    B. Wrist Stability of Wrist at 15* 2  Repeated Flex/Ext 2  Circumduction 1  Stability at 15*Wrist, 0*Elbow 2  Flexion/Extension - Elbow 0* 2  Total 9/10   C. Hand Mass Flexion 1  Mass Extension 1  Total 2/4    D. Grasp Hook  2  Thumb Adduction - paper 0  Pincer - pen 0  Cylindrical - cup 0  Spherical - ball 0  Total 2/10   E. Coordination/Speed Tremor 2  Dysmetria 1  Time 1  Total 4/6  FUGL-MEYER ASSESSMENT: UPPER EXTREMITY A-E TOTAL: 38/66  ---------------------------------------------------------------------------------------------------------------------  TODAY'S TREATMENT:                                                                                                                               N/A this date  PATIENT EDUCATION: Education details: OT POC; Vivistim Person educated: Patient Education method: Explanation Education comprehension: verbalized understanding  HOME EXERCISE PROGRAM: N/A this date  GOALS:  SHORT TERM GOALS: Target date:  07/17/2023   Patient will demonstrate independence with initial LUE HEP. Baseline: Goal status: INITIAL  2.  OT to assess L grip strength and set appropriate goal. Baseline:  Goal status: INITIAL   LONG TERM GOALS: Target date: 08/26/2023  Patient will demonstrate updated LUE HEP with 25% verbal cues or less for proper execution. Baseline:  Goal status: INITIAL  2.  OT to set appropriate fine motor goal. Baseline:  Goal status: INITIAL  3.  Pt to demonstrate improved L thumb abduction AROM as needed to position business card between 1st and 2nd digits.  Baseline: unable to complete Goal status: INITIAL  4.  Pt to demonstrate at least 50% L composite extension AROM as needed to grasp items in hand. Baseline: <25% Goal status: INITIAL   ASSESSMENT:  CLINICAL IMPRESSION: Patient is a 62 yr old male referred to OT for assessment to determine qualification for implantable nerve stimulator to address LUE functioning.  Completed Fugl Meyer UE assessment, as well as Myrtis Ser index of Independence in ADL, and [name] IADL Scale.  Patient has adequate sensation, cognition, and motivation throughout LUE with limitations in thumb abduction and digit extension . Patient has been involved in intense rehab programs in the past but not at this clinic or with Vivistim in mind.  Patient is motivated to improve functional use and movement in his LUE arm. Recommend further therapy and consideration of Botox to improve distal ROM.   PERFORMANCE DEFICITS in functional skills including ADLs, IADLs, coordination, dexterity, edema, tone, ROM, strength, Fine motor control, and UE functional use.   IMPAIRMENTS are limiting patient from ADLs, IADLs, and leisure.   COMORBIDITIES may have co-morbidities  that affects occupational performance. Patient will benefit from skilled OT to address above impairments and improve overall function.  MODIFICATION OR ASSISTANCE TO COMPLETE EVALUATION: Min-Moderate  modification of tasks or assist with assess necessary to complete an evaluation.  OT OCCUPATIONAL PROFILE AND HISTORY: Problem focused assessment: Including review of records relating to presenting problem.  CLINICAL DECISION MAKING: LOW - limited treatment options, no task modification necessary  REHAB POTENTIAL: Good  EVALUATION COMPLEXITY: Low  PLAN:  Will determine appropriateness of candidacy for Vivistim and reevaluate and develop plan upon return if determined appropriate.  Vivistim Checklist Stroke Chronicity: >6 months post ischemic stroke  Affected Side: left upper extremity  Hand Dominance: right handed prior to CVA; currently right handed  Fugl-Meyer Score (UE): 38/66 (LEAPS Version) indicating moderate to severe impairment  ADL/IADL  Status: Pt demonstrates functional deficits with L digit extension, thumb abduction, supination, and spasticity that negatively affects occupational performance and overall level of independence.   Currently, this pt is requiring increased time and compensation with use of unaffected RUE  for activity completion (see Objective section for further details).  Spasticity: Spasticity is not managed well enough to recommend Vivistim at this time. Recommend pt Inquire about Botox and Continue with HEP and re-consult if improvement is achieved.   General Therapy History: Pt has been seen for LUE intervention in the inpatient, SNF, and OP setting(s) to address functional deficits following CVA.   Rehab Potential:  The patient is motivated to perform intensive outpatient occupational therapy.     OT FREQUENCY: 1x/week  OT DURATION: 8 weeks  PLANNED INTERVENTIONS: self care/ADL training, therapeutic exercise, therapeutic activity, neuromuscular re-education, manual therapy, passive range of motion, splinting, electrical stimulation, ultrasound, moist heat, patient/family education, and Re-evaluation  RECOMMENDED OTHER SERVICES: Consult with PM&R for  possible Botox  CONSULTED AND AGREED WITH PLAN OF CARE: Patient  PLAN FOR NEXT SESSION: Assess grip strength and 9 hole/Box and Blocks and set appropriate goals; review HEP (work on L digit ext, supination, thumb abduction as needed to improve candidacy for Vivistim)  Delana Meyer, OT 06/16/2023, 5:05 PM

## 2023-06-16 ENCOUNTER — Ambulatory Visit: Payer: Managed Care, Other (non HMO) | Attending: Family Medicine | Admitting: Occupational Therapy

## 2023-06-16 ENCOUNTER — Encounter: Payer: Self-pay | Admitting: Occupational Therapy

## 2023-06-16 DIAGNOSIS — R29818 Other symptoms and signs involving the nervous system: Secondary | ICD-10-CM | POA: Diagnosis present

## 2023-06-16 DIAGNOSIS — M6281 Muscle weakness (generalized): Secondary | ICD-10-CM | POA: Diagnosis present

## 2023-06-16 DIAGNOSIS — R208 Other disturbances of skin sensation: Secondary | ICD-10-CM | POA: Insufficient documentation

## 2023-06-16 DIAGNOSIS — R278 Other lack of coordination: Secondary | ICD-10-CM | POA: Insufficient documentation

## 2023-06-20 ENCOUNTER — Telehealth: Payer: Self-pay | Admitting: Adult Health

## 2023-06-20 DIAGNOSIS — R252 Cramp and spasm: Secondary | ICD-10-CM

## 2023-06-20 DIAGNOSIS — I63511 Cerebral infarction due to unspecified occlusion or stenosis of right middle cerebral artery: Secondary | ICD-10-CM

## 2023-06-20 NOTE — Telephone Encounter (Signed)
-----   Message from Delana Meyer sent at 06/19/2023  8:27 PM EDT ----- Regarding: Spasticity Management Hello Anniebelle Devore,   I evaluated this pt in OT for Vivistim. While technically he scored high enough to qualify, I am recommending he be referred to PM&R for Botox consult in addition to some additional therapy. I am hoping if he is able to get Botox, he might be able to gain more active digit extension in his L affected hand.  If approved, please send referral.   Let me know if you have any concerns or questions!   Thank you,   Shelbie Proctor, OTR/L 06/19/2023 [facility]ational Therapy [facility] Phone: [phone] Fax: [phone]

## 2023-06-20 NOTE — Telephone Encounter (Signed)
Order placed to [facility]

## 2023-06-21 ENCOUNTER — Telehealth: Payer: Self-pay | Admitting: Adult Health

## 2023-06-21 NOTE — Telephone Encounter (Signed)
Referral physical medicine Rehabilitation sent through EP[facility]  to [facility]. Phone: [phone]

## 2023-06-28 ENCOUNTER — Encounter: Payer: Self-pay | Admitting: Physical Medicine & Rehabilitation

## 2023-07-06 ENCOUNTER — Ambulatory Visit: Payer: Managed Care, Other (non HMO) | Admitting: Occupational Therapy

## 2023-07-06 ENCOUNTER — Encounter: Payer: Self-pay | Admitting: Occupational Therapy

## 2023-07-06 DIAGNOSIS — M6281 Muscle weakness (generalized): Secondary | ICD-10-CM | POA: Diagnosis not present

## 2023-07-06 DIAGNOSIS — R29818 Other symptoms and signs involving the nervous system: Secondary | ICD-10-CM

## 2023-07-06 DIAGNOSIS — R278 Other lack of coordination: Secondary | ICD-10-CM

## 2023-07-06 DIAGNOSIS — R208 Other disturbances of skin sensation: Secondary | ICD-10-CM

## 2023-07-06 NOTE — Patient Instructions (Signed)
MP Flexion (Passive)    Use other hand to gently bend __all____ fingers at large knuckles and straighten fingers and middle joints. Hold ___10_ seconds. Repeat _5___ times. Do _6___ sessions per day.   AROM: Thumb Abduction / Adduction    Actively pull right thumb away in front of palm as far as possible. Hold __10__ seconds. Then bring thumb back to touch fingers. Try not to bend fingers toward thumb. Repeat __5__ times per set.  Do __6__ sessions per day.  Supination (Active)    With elbow held at right angle and kept at side, turn palm upward as far as possible. Hold __5__ seconds. Repeat __10__ times. Do __6__ sessions per day. Activity: Use this motion when turning cards over.*

## 2023-07-06 NOTE — Therapy (Signed)
OUTPATIENT OCCUPATIONAL THERAPY TREATMENT  Patient Name: Vincent Ferrell MRN: [id number] DOB:[birth year], 62 y.o., male Today's Date: 07/06/2023  PCP: Toy Care, DO REFERRING PROVIDER: Toy Care, DO   OT End of Session - 07/06/23 [phone]     Visit Number 2    Number of Visits 9    Date for OT Re-Evaluation 08/26/23    Authorization Type [employer] - 90 OT, PT, ST VL combined    OT Start Time 0848    OT Stop Time 0930    OT Time Calculation (min) 42 min    Activity Tolerance Patient tolerated treatment well    Behavior During Therapy [facility] for tasks assessed/performed            Past Medical History:  Diagnosis Date   Chest pain in adult 10/24/2017   Elevated BP without diagnosis of hypertension 10/24/2017   Impaired fasting glucose 10/24/2017   Mixed hyperlipidemia 10/24/2017   Multifocal pneumonia 12/30/2021   Rising PSA level 10/24/2017   Past Surgical History:  Procedure Laterality Date   COLONOSCOPY WITH PROPOFOL N/A 12/28/2021   Procedure: COLONOSCOPY WITH PROPOFOL;  Surgeon: Tressia Danas, MD;  Location: [facility] ENDOSCOPY;  Service: Gastroenterology;  Laterality: N/A;   COLONOSCOPY WITH PROPOFOL N/A 12/29/2021   Procedure: COLONOSCOPY WITH PROPOFOL;  Surgeon: Tressia Danas, MD;  Location[facility] ENDOSCOPY;  Service: Gastroenterology;  Laterality: N/A;   ESOPHAGOGASTRODUODENOSCOPY (EGD) WITH PROPOFOL N/A 12/29/2021   Procedure: ESOPHAGOGASTRODUODENOSCOPY (EGD) WITH PROPOFOL;  Surgeon: Tressia Danas, MD;  Location: [facility] ENDOSCOPY;  Service: Gastroenterology;  Laterality: N/A;   ESOPHAGOGASTRODUODENOSCOPY (EGD) WITH PROPOFOL  01/05/2022   Procedure: ESOPHAGOGASTRODUODENOSCOPY (EGD) WITH PROPOFOL;  Surgeon: Diamantina Monks, MD;  Location: [facility] ENDOSCOPY;  Service: General;;   IR CT HEAD LTD  12/25/2021   IR PERCUTANEOUS ART THROMBECTOMY/INFUSION INTRACRANIAL INC DIAG ANGIO  12/25/2021   PEG PLACEMENT N/A 01/05/2022   Procedure: PERCUTANEOUS ENDOSCOPIC GASTROSTOMY (PEG)  PLACEMENT;  Surgeon: Diamantina Monks, MD;  Location: [facility] ENDOSCOPY;  Service: General;  Laterality: N/A;   RADIOLOGY WITH ANESTHESIA N/A 12/25/2021   Procedure: IR WITH ANESTHESIA;  Surgeon: Radiologist, Medication, MD;  Location: [facility] OR;  Service: Radiology;  Laterality: N/A;   TRANSURETHRAL RESECTION OF PROSTATE N/A 12/29/2021   Procedure: TRANSURETHRAL RESECTION OF THE PROSTATE (TURP);  Surgeon: Crista Elliot, MD;  Location: [facility] OR;  Service: Urology;  Laterality: N/A;   Patient Active Problem List   Diagnosis Date Noted   Encephalopathy acute 01/07/2022   Severe protein-calorie malnutrition (HCC) 01/07/2022   Physical deconditioning 01/07/2022   Diarrhea 01/07/2022   Pericardial effusion    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   Prostate abscess 12/30/2021   Acute kidney injury (HCC) 12/30/2021   Anemia due to GI blood loss    Acute respiratory failure with hypoxia (HCC)    Acute ischemic right MCA stroke (HCC) 12/25/2021   Sinus tachycardia    Chest pain in adult 10/24/2017   Mixed hyperlipidemia 10/24/2017   Rising PSA level 10/24/2017   Impaired fasting glucose 10/24/2017   Elevated BP without diagnosis of hypertension 10/24/2017    Rationale for Evaluation and Treatment: Rehabilitation  ONSET DATE: 06/08/2023 (date of referral)  REFERRING DIAG: I69.30 (ICD-10-CM) - Unspecified sequelae of cerebral infarction R53.1 (ICD-10-CM) - Weakness  THERAPY DIAG:  Other lack of coordination  Muscle weakness (generalized)  Other disturbances of skin sensation  Other symptoms and signs involving the nervous system  SUBJECTIVE:   SUBJECTIVE STATEMENT: Pt reports he is a little hesitant to  have Vivistim implanted or Botox injections, but he is eager to improve the function of his LUE.   Pt accompanied by: self  PERTINENT HISTORY: PMH: CVA 12/25/2021, endocarditis, HTN, HLD  MRI from April, 2023 showed subacute ischemia in the right MCA, right occipital lobe, left frontal  and parietal white matter and petechial blood in the left parietal lobe   PRECAUTIONS: None  WEIGHT BEARING RESTRICTIONS No  PAIN:  Are you having pain? Yes: NPRS scale: 1/10 Pain location: L hand Pain description: sore, stiff  FALLS: Has patient fallen in last 6 months? No  LIVING ENVIRONMENT: Lives with: lives alone Lives in: House/apartment Stairs: No Has following equipment at home: None  PLOF: Independent; retired; driving  PATIENT GOALS Improve use of LUE  OBJECTIVE:   HAND DOMINANCE: Right  SENSATION: Occasional paresthesias reported  MUSCLE TONE: LUE: Mild and Hypertonic ; uses Baclofen and heating pad  COGNITION: Overall cognitive status: Within functional limits for tasks assessed  PERCEPTION: Not tested  PRAXIS: Not tested  --------------------------------------------------------------------------------------------------------------------- [name]: IADL Scale  A. Ability to Use Telephone  Operates telephone on own initiative-looks up and dials numbers, etc. - 1  B. Shopping Takes care of all shopping needs independently - 1  C. Food Preparation Plans, prepares and serves adequate meals independently - 1  D. Housekeeping Performs light daily tasks such as dish washing, bed making - 1  E. Laundry  Does Engineer, manufacturing - 1  F. Mode of Transportation  Travels independently on public transportation or drives own car - 1  G. Responsibility for Own Medications Is responsible for taking medication in correct dosages at correct time - 1  H. Ability to [employer] financial matters independently (budgets, writes checks, pays rent, bills, goes to [employer]), collects and keeps track of income - 1    TOTAL LAWTON BRODY SCORE: 8/8  -----------------------------------------------------------  Myrtis Ser Index of Independence in ADL INDEPENDENCE (1) DEPENDENCE (0)  NO supervision, direction or personal assistance WITH supervision, direction,  personal assistance or total care   ACTIVITIES 1 or 0  Bathing 1  Dressing 1  Toileting 1  Transferring 1  Continence 1  Feeding 0    KATZ TOTAL: 5/6  -----------------------------------------------------------  FUGL-MEYER ASSESSMENT   A. Upper Extremity  I. Reflex Activity: Flexors (elbow) 2  Extensors (triceps) 2  Total 4/4     II. Volitional Movement within Synergies: Flexor Synergy:   Shoulder Retraction 1  Shoulder Elevation 1  Shoulder Abduction 1  External Rotation 1  Elbow Flexion 1  Forearm Supination 1  Extensor Synergy:   Shoulder Adduction/Internal Rotation 1  Elbow Extension 2  Forearm Pronation 2  Total 11/18   III. Volitional Mixing of Synergies: Hand to Lumbar Spine 1  Shoulder Flexion 1  Pronation/Supination 1  Total 3/6   IV. Volitional Movement with Little to No Synergy Shoulder Abduction 1  Shoulder Flexion 90+ 1  Pronation/Supination 1  Total 3/6    B. Wrist Stability of Wrist at 15* 2  Repeated Flex/Ext 2  Circumduction 1  Stability at 15*Wrist, 0*Elbow 2  Flexion/Extension - Elbow 0* 2  Total 9/10   C. Hand Mass Flexion 1  Mass Extension 1  Total 2/4    D. Grasp Hook  2  Thumb Adduction - paper 0  Pincer - pen 0  Cylindrical - cup 0  Spherical - ball 0  Total 2/10   E. Coordination/Speed Tremor 2  Dysmetria 1  Time 1  Total 4/6  FUGL-MEYER ASSESSMENT: UPPER EXTREMITY A-E TOTAL: 38/66   07/06/23:   Grip strength Lt = 6-8 lbs (placing fingers around dynamometer)  Box & Blocks Lt = unable w/o assist from Rt hand to help open hand and place blocks in hand 9 hole peg Lt = unable  ---------------------------------------------------------------------------------------------------------------------  TODAY'S TREATMENT:                                                                                                                               Assessed grip, box & blocks, and 9 hole - see above. Pt unable to  perform Box & Blocks and 9 hole peg test. Updated goal section  Attempted estim for finger extension however pt only went into MP hyperextension and more IP flexion  Pt shown stretches to perform and spent a great deal of session discussing and demo proper technique to stretch IP joints in extension, but maintaining some MP flexion and palmer arch as pt tends to hyperextend already overly stretched MP's. Pt also shown stretch for thumb in palmer abduction - pt unable to perform actively or in AA/ROM.  Pt shown A/ROM in supination - pt able to get to neutral to past neutral     PATIENT EDUCATION: Education details: initial HEP  Person educated: Patient Education method: Explanation Education comprehension: verbalized understanding  HOME EXERCISE PROGRAM: N/A this date  GOALS:  SHORT TERM GOALS: Target date: 07/17/2023   Patient will demonstrate independence with initial LUE HEP. Baseline: Goal status: IN PROGRESS  2.  Pt to achieve at least 10 lbs grip strength Lt hand Baseline: 6-8 lbs Goal status: INITIAL  3. Pt to be independent with resting hand splint to position him in IP extension and thumb palmer abduction, and taco splint to keep IP's straight while working on MP flex/ext   Goal status: initial    LONG TERM GOALS: Target date: 08/26/2023  Patient will demonstrate updated LUE HEP with 25% verbal cues or less for proper execution. Baseline:  Goal status: INITIAL  2.  Pt to improve LUE function as evidenced by performing 5 or more blocks on Box & Blocks test Baseline: unable w/o assist from Rt hand Goal status: INITIAL  3.  Pt to demonstrate improved L thumb abduction AROM as needed to position business card between 1st and 2nd digits.  Baseline: unable to complete Goal status: INITIAL  4.  Pt to demonstrate at least 50% AROM L composite extension at IP's as needed to grasp items in hand. Baseline: <25% Goal status: INITIAL   ASSESSMENT:  CLINICAL  IMPRESSION: Patient returns today for initial O.T. treatment following vivistim evaluation. Pt limited by IP extension of fingers, thumb palmer abd, wrist ext and supination for functional movement. Pt can use Lt hand to hold some light weight things if placed in hand by Rt hand, but cannot release object without removing with other hand. Pt would benefit from botox to FDS/FDP, thumb adduction muscle, pronator muscles.  PERFORMANCE DEFICITS in functional skills including ADLs, IADLs, coordination, dexterity, edema, tone, ROM, strength, Fine motor control, and UE functional use.   IMPAIRMENTS are limiting patient from ADLs, IADLs, and leisure.   COMORBIDITIES may have co-morbidities  that affects occupational performance. Patient will benefit from skilled OT to address above impairments and improve overall function.  MODIFICATION OR ASSISTANCE TO COMPLETE EVALUATION: Min-Moderate modification of tasks or assist with assess necessary to complete an evaluation.  OT OCCUPATIONAL PROFILE AND HISTORY: Problem focused assessment: Including review of records relating to presenting problem.  CLINICAL DECISION MAKING: LOW - limited treatment options, no task modification necessary  REHAB POTENTIAL: Good  EVALUATION COMPLEXITY: Low  PLAN:  Will determine appropriateness of candidacy for Vivistim and reevaluate and develop plan upon return if determined appropriate.  Vivistim Checklist Stroke Chronicity: >6 months post ischemic stroke  Affected Side: left upper extremity  Hand Dominance: right handed prior to CVA; currently right handed  Fugl-Meyer Score (UE): 38/66 (LEAPS Version) indicating moderate to severe impairment  ADL/IADL Status: Pt demonstrates functional deficits with L digit extension, thumb abduction, supination, and spasticity that negatively affects occupational performance and overall level of independence.   Currently, this pt is requiring increased time and compensation with use  of unaffected RUE  for activity completion (see Objective section for further details).  Spasticity: Spasticity is not managed well enough to recommend Vivistim at this time. Recommend pt Inquire about Botox and Continue with HEP and re-consult if improvement is achieved.   General Therapy History: Pt has been seen for LUE intervention in the inpatient, SNF, and OP setting(s) to address functional deficits following CVA.   Rehab Potential:  The patient is motivated to perform intensive outpatient occupational therapy.     OT FREQUENCY: 1x/week  OT DURATION: 8 weeks  PLANNED INTERVENTIONS: self care/ADL training, therapeutic exercise, therapeutic activity, neuromuscular re-education, manual therapy, passive range of motion, splinting, electrical stimulation, ultrasound, moist heat, patient/family education, and Re-evaluation  RECOMMENDED OTHER SERVICES: Consult with PM&R for possible Botox  CONSULTED AND AGREED WITH PLAN OF CARE: Patient  PLAN FOR NEXT SESSION: resting hand splint, taco splint, consider placing on hold until after botox  Sheran Lawless, OT 07/06/2023, 8:51 AM

## 2023-07-12 ENCOUNTER — Encounter: Payer: Self-pay | Admitting: Occupational Therapy

## 2023-07-12 ENCOUNTER — Ambulatory Visit: Payer: Managed Care, Other (non HMO) | Admitting: Occupational Therapy

## 2023-07-12 DIAGNOSIS — R29818 Other symptoms and signs involving the nervous system: Secondary | ICD-10-CM

## 2023-07-12 DIAGNOSIS — M6281 Muscle weakness (generalized): Secondary | ICD-10-CM | POA: Diagnosis not present

## 2023-07-12 NOTE — Therapy (Signed)
OUTPATIENT OCCUPATIONAL THERAPY TREATMENT  Patient Name: Vincent Ferrell MRN: [id number] DOB:[birth year], 62 y.o., male Today's Date: 07/12/2023  PCP: Toy Care, DO REFERRING PROVIDER: Toy Care, DO   OT End of Session - 07/12/23 [phone]     Visit Number 3    Number of Visits 9    Date for OT Re-Evaluation 08/26/23    Authorization Type [employer] - 90 OT, PT, ST VL combined    OT Start Time 0800    OT Stop Time 0845    OT Time Calculation (min) 45 min    Activity Tolerance Patient tolerated treatment well    Behavior During Therapy [facility] for tasks assessed/performed            Past Medical History:  Diagnosis Date   Chest pain in adult 10/24/2017   Elevated BP without diagnosis of hypertension 10/24/2017   Impaired fasting glucose 10/24/2017   Mixed hyperlipidemia 10/24/2017   Multifocal pneumonia 12/30/2021   Rising PSA level 10/24/2017   Past Surgical History:  Procedure Laterality Date   COLONOSCOPY WITH PROPOFOL N/A 12/28/2021   Procedure: COLONOSCOPY WITH PROPOFOL;  Surgeon: Tressia Danas, MD;  Location: [facility] ENDOSCOPY;  Service: Gastroenterology;  Laterality: N/A;   COLONOSCOPY WITH PROPOFOL N/A 12/29/2021   Procedure: COLONOSCOPY WITH PROPOFOL;  Surgeon: Tressia Danas, MD;  Location: [facility] ENDOSCOPY;  Service: Gastroenterology;  Laterality: N/A;   ESOPHAGOGASTRODUODENOSCOPY (EGD) WITH PROPOFOL N/A 12/29/2021   Procedure: ESOPHAGOGASTRODUODENOSCOPY (EGD) WITH PROPOFOL;  Surgeon: Tressia Danas, MD;  Location: [facility] ENDOSCOPY;  Service: Gastroenterology;  Laterality: N/A;   ESOPHAGOGASTRODUODENOSCOPY (EGD) WITH PROPOFOL  01/05/2022   Procedure: ESOPHAGOGASTRODUODENOSCOPY (EGD) WITH PROPOFOL;  Surgeon: Diamantina Monks, MD;  Location: [facility] ENDOSCOPY;  Service: General;;   IR CT HEAD LTD  12/25/2021   IR PERCUTANEOUS ART THROMBECTOMY/INFUSION INTRACRANIAL INC DIAG ANGIO  12/25/2021   PEG PLACEMENT N/A 01/05/2022   Procedure: PERCUTANEOUS ENDOSCOPIC GASTROSTOMY (PEG)  PLACEMENT;  Surgeon: Diamantina Monks, MD;  Location: [facility] ENDOSCOPY;  Service: General;  Laterality: N/A;   RADIOLOGY WITH ANESTHESIA N/A 12/25/2021   Procedure: IR WITH ANESTHESIA;  Surgeon: Radiologist, Medication, MD;  Location: [facility] OR;  Service: Radiology;  Laterality: N/A;   TRANSURETHRAL RESECTION OF PROSTATE N/A 12/29/2021   Procedure: TRANSURETHRAL RESECTION OF THE PROSTATE (TURP);  Surgeon: Crista Elliot, MD;  Location: [facility] OR;  Service: Urology;  Laterality: N/A;   Patient Active Problem List   Diagnosis Date Noted   Encephalopathy acute 01/07/2022   Severe protein-calorie malnutrition (HCC) 01/07/2022   Physical deconditioning 01/07/2022   Diarrhea 01/07/2022   Pericardial effusion    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   Prostate abscess 12/30/2021   Acute kidney injury (HCC) 12/30/2021   Anemia due to GI blood loss    Acute respiratory failure with hypoxia (HCC)    Acute ischemic right MCA stroke (HCC) 12/25/2021   Sinus tachycardia    Chest pain in adult 10/24/2017   Mixed hyperlipidemia 10/24/2017   Rising PSA level 10/24/2017   Impaired fasting glucose 10/24/2017   Elevated BP without diagnosis of hypertension 10/24/2017    Rationale for Evaluation and Treatment: Rehabilitation  ONSET DATE: 06/08/2023 (date of referral)  REFERRING DIAG: I69.30 (ICD-10-CM) - Unspecified sequelae of cerebral infarction R53.1 (ICD-10-CM) - Weakness  THERAPY DIAG:  Other symptoms and signs involving the nervous system  Muscle weakness (generalized)  SUBJECTIVE:   SUBJECTIVE STATEMENT: Pain less than 1/10 in hand but I've been stretching it  Pt accompanied by: self  PERTINENT HISTORY:  PMH: CVA 12/25/2021, endocarditis, HTN, HLD  MRI from April, 2023 showed subacute ischemia in the right MCA, right occipital lobe, left frontal and parietal white matter and petechial blood in the left parietal lobe   PRECAUTIONS: None  WEIGHT BEARING RESTRICTIONS No  PAIN:  Are you  having pain? Yes: NPRS scale: 1/10 Pain location: L hand Pain description: sore, stiff  FALLS: Has patient fallen in last 6 months? No  LIVING ENVIRONMENT: Lives with: lives alone Lives in: House/apartment Stairs: No Has following equipment at home: None  PLOF: Independent; retired; driving  PATIENT GOALS Improve use of LUE  OBJECTIVE:   HAND DOMINANCE: Right  SENSATION: Occasional paresthesias reported  MUSCLE TONE: LUE: Mild and Hypertonic ; uses Baclofen and heating pad  COGNITION: Overall cognitive status: Within functional limits for tasks assessed  PERCEPTION: Not tested  PRAXIS: Not tested  --------------------------------------------------------------------------------------------------------------------- Vincent Ferrell: IADL Scale  A. Ability to Use Telephone  Operates telephone on own initiative-looks up and dials numbers, etc. - 1  B. Shopping Takes care of all shopping needs independently - 1  C. Food Preparation Plans, prepares and serves adequate meals independently - 1  D. Housekeeping Performs light daily tasks such as dish washing, bed making - 1  E. Laundry  Does Engineer, manufacturing - 1  F. Mode of Transportation  Travels independently on public transportation or drives own car - 1  G. Responsibility for Own Medications Is responsible for taking medication in correct dosages at correct time - 1  H. Ability to [employer] financial matters independently (budgets, writes checks, pays rent, bills, goes to [employer]), collects and keeps track of income - 1    TOTAL Vincent Ferrell SCORE: 8/8  -----------------------------------------------------------  Vincent Ferrell Index of Independence in ADL INDEPENDENCE (1) DEPENDENCE (0)  NO supervision, direction or personal assistance WITH supervision, direction, personal assistance or total care   ACTIVITIES 1 or 0  Bathing 1  Dressing 1  Toileting 1  Transferring 1  Continence 1  Feeding 0     KATZ TOTAL: 5/6  -----------------------------------------------------------  FUGL-MEYER ASSESSMENT   A. Upper Extremity  I. Reflex Activity: Flexors (elbow) 2  Extensors (triceps) 2  Total 4/4     II. Volitional Movement within Synergies: Flexor Synergy:   Shoulder Retraction 1  Shoulder Elevation 1  Shoulder Abduction 1  External Rotation 1  Elbow Flexion 1  Forearm Supination 1  Extensor Synergy:   Shoulder Adduction/Internal Rotation 1  Elbow Extension 2  Forearm Pronation 2  Total 11/18   III. Volitional Mixing of Synergies: Hand to Lumbar Spine 1  Shoulder Flexion 1  Pronation/Supination 1  Total 3/6   IV. Volitional Movement with Little to No Synergy Shoulder Abduction 1  Shoulder Flexion 90+ 1  Pronation/Supination 1  Total 3/6    B. Wrist Stability of Wrist at 15* 2  Repeated Flex/Ext 2  Circumduction 1  Stability at 15*Wrist, 0*Elbow 2  Flexion/Extension - Elbow 0* 2  Total 9/10   C. Hand Mass Flexion 1  Mass Extension 1  Total 2/4    D. Grasp Hook  2  Thumb Adduction - paper 0  Pincer - pen 0  Cylindrical - cup 0  Spherical - ball 0  Total 2/10   E. Coordination/Speed Tremor 2  Dysmetria 1  Time 1  Total 4/6    FUGL-MEYER ASSESSMENT: UPPER EXTREMITY A-E TOTAL: 38/66   07/06/23:   Grip strength Lt = 6-8 lbs (placing fingers around dynamometer)  Box &  Blocks Lt = unable w/o assist from Rt hand to help open hand and place blocks in hand 9 hole peg Lt = unable  ---------------------------------------------------------------------------------------------------------------------  TODAY'S TREATMENT:                                                                                                                               Fabricated and fitted resting hand splint and issued. Attempts to get more MP flexion and more IP extension in splint but may need more modifications to fingers as pt then radially deviates fingers and  unable to fully prevent with adapted strapping.   Reviewed wear and care of splint including gradual tolerance of wearing splint during the day over several days before trying to wear at night.     PATIENT EDUCATION: Education details: splint wear and care Person educated: Patient Education method: Explanation, demo Education comprehension: verbalized understanding, return demo of donning/doffing  HOME EXERCISE PROGRAM: N/A this date  GOALS:  SHORT TERM GOALS: Target date: 07/17/2023   Patient will demonstrate independence with initial LUE HEP. Baseline: Goal status: IN PROGRESS  2.  Pt to achieve at least 10 lbs grip strength Lt hand Baseline: 6-8 lbs Goal status: INITIAL  3. Pt to be independent with resting hand splint to position him in IP extension and thumb palmer abduction, and taco splint to keep IP's straight while working on MP flex/ext   Goal status: IN PROGRESS   LONG TERM GOALS: Target date: 08/26/2023  Patient will demonstrate updated LUE HEP with 25% verbal cues or less for proper execution. Baseline:  Goal status: INITIAL  2.  Pt to improve LUE function as evidenced by performing 5 or more blocks on Box & Blocks test Baseline: unable w/o assist from Rt hand Goal status: INITIAL  3.  Pt to demonstrate improved L thumb abduction AROM as needed to position business card between 1st and 2nd digits.  Baseline: unable to complete Goal status: INITIAL  4.  Pt to demonstrate at least 50% AROM L composite extension at IP's as needed to grasp items in hand. Baseline: <25% Goal status: INITIAL   ASSESSMENT:  CLINICAL IMPRESSION: Patient returns today for O.T. treatment for splinting needs. Pt limited by IP extension of fingers, thumb palmer abd, wrist ext and supination for functional movement. Pt can use Lt hand to hold some light weight things if placed in hand by Rt hand, but cannot release object without removing with other hand. Pt would benefit from  botox to FDS/FDP, thumb adduction muscle, pronator muscles.   PERFORMANCE DEFICITS in functional skills including ADLs, IADLs, coordination, dexterity, edema, tone, ROM, strength, Fine motor control, and UE functional use.   IMPAIRMENTS are limiting patient from ADLs, IADLs, and leisure.   COMORBIDITIES may have co-morbidities  that affects occupational performance. Patient will benefit from skilled OT to address above impairments and improve overall function.  MODIFICATION OR ASSISTANCE TO COMPLETE EVALUATION: Min-Moderate modification of tasks or assist  with assess necessary to complete an evaluation.  OT OCCUPATIONAL PROFILE AND HISTORY: Problem focused assessment: Including review of records relating to presenting problem.  CLINICAL DECISION MAKING: LOW - limited treatment options, no task modification necessary  REHAB POTENTIAL: Good  EVALUATION COMPLEXITY: Low  PLAN:  Will determine appropriateness of candidacy for Vivistim and reevaluate and develop plan upon return if determined appropriate.  Vivistim Checklist Stroke Chronicity: >6 months post ischemic stroke  Affected Side: left upper extremity  Hand Dominance: right handed prior to CVA; currently right handed  Fugl-Meyer Score (UE): 38/66 (LEAPS Version) indicating moderate to severe impairment  ADL/IADL Status: Pt demonstrates functional deficits with L digit extension, thumb abduction, supination, and spasticity that negatively affects occupational performance and overall level of independence.   Currently, this pt is requiring increased time and compensation with use of unaffected RUE  for activity completion (see Objective section for further details).  Spasticity: Spasticity is not managed well enough to recommend Vivistim at this time. Recommend pt Inquire about Botox and Continue with HEP and re-consult if improvement is achieved.   General Therapy History: Pt has been seen for LUE intervention in the inpatient, SNF,  and OP setting(s) to address functional deficits following CVA.   Rehab Potential:  The patient is motivated to perform intensive outpatient occupational therapy.     OT FREQUENCY: 1x/week  OT DURATION: 8 weeks  PLANNED INTERVENTIONS: self care/ADL training, therapeutic exercise, therapeutic activity, neuromuscular re-education, manual therapy, passive range of motion, splinting, electrical stimulation, ultrasound, moist heat, patient/family education, and Re-evaluation  RECOMMENDED OTHER SERVICES: Consult with PM&R for possible Botox  CONSULTED AND AGREED WITH PLAN OF CARE: Patient  PLAN FOR NEXT SESSION: modify resting hand splint prn, taco splint to hold IP's in ext while trying to work on MP flexion, consider placing on hold until after botox  Sheran Lawless, OT 07/12/2023, 8:04 AM

## 2023-07-14 ENCOUNTER — Encounter: Payer: Managed Care, Other (non HMO) | Admitting: Physical Medicine & Rehabilitation

## 2023-07-19 ENCOUNTER — Ambulatory Visit: Payer: Managed Care, Other (non HMO) | Attending: Family Medicine | Admitting: Occupational Therapy

## 2023-07-19 DIAGNOSIS — R29818 Other symptoms and signs involving the nervous system: Secondary | ICD-10-CM | POA: Insufficient documentation

## 2023-07-19 DIAGNOSIS — R278 Other lack of coordination: Secondary | ICD-10-CM | POA: Insufficient documentation

## 2023-07-19 DIAGNOSIS — M6281 Muscle weakness (generalized): Secondary | ICD-10-CM | POA: Diagnosis present

## 2023-07-19 DIAGNOSIS — R208 Other disturbances of skin sensation: Secondary | ICD-10-CM | POA: Insufficient documentation

## 2023-07-19 NOTE — Therapy (Signed)
OUTPATIENT OCCUPATIONAL THERAPY TREATMENT  Patient Name: Vincent Ferrell MRN: [id number] DOB:1961/06/04, 62 y.o., male Today's Date: 07/19/2023  PCP: Toy Care, DO REFERRING PROVIDER: Toy Care, DO   OT End of Session - 07/19/23 0850     Visit Number 4    Number of Visits 9    Date for OT Re-Evaluation 08/26/23    Authorization Type [employer] - 90 OT, PT, ST VL combined    OT Start Time 0848    OT Stop Time 0930    OT Time Calculation (min) 42 min    Activity Tolerance Patient tolerated treatment well    Behavior During Therapy [facility] for tasks assessed/performed            Past Medical History:  Diagnosis Date   Chest pain in adult 10/24/2017   Elevated BP without diagnosis of hypertension 10/24/2017   Impaired fasting glucose 10/24/2017   Mixed hyperlipidemia 10/24/2017   Multifocal pneumonia 12/30/2021   Rising PSA level 10/24/2017   Past Surgical History:  Procedure Laterality Date   COLONOSCOPY WITH PROPOFOL N/A 12/28/2021   Procedure: COLONOSCOPY WITH PROPOFOL;  Surgeon: Tressia Danas, MD;  Location: [facility] ENDOSCOPY;  Service: Gastroenterology;  Laterality: N/A;   COLONOSCOPY WITH PROPOFOL N/A 12/29/2021   Procedure: COLONOSCOPY WITH PROPOFOL;  Surgeon: Tressia Danas, MD;  Location: [facility] ENDOSCOPY;  Service: Gastroenterology;  Laterality: N/A;   ESOPHAGOGASTRODUODENOSCOPY (EGD) WITH PROPOFOL N/A 12/29/2021   Procedure: ESOPHAGOGASTRODUODENOSCOPY (EGD) WITH PROPOFOL;  Surgeon: Tressia Danas, MD;  Location: [facility] ENDOSCOPY;  Service: Gastroenterology;  Laterality: N/A;   ESOPHAGOGASTRODUODENOSCOPY (EGD) WITH PROPOFOL  01/05/2022   Procedure: ESOPHAGOGASTRODUODENOSCOPY (EGD) WITH PROPOFOL;  Surgeon: Diamantina Monks, MD;  Location: [facility] ENDOSCOPY;  Service: General;;   IR CT HEAD LTD  12/25/2021   IR PERCUTANEOUS ART THROMBECTOMY/INFUSION INTRACRANIAL INC DIAG ANGIO  12/25/2021   PEG PLACEMENT N/A 01/05/2022   Procedure: PERCUTANEOUS ENDOSCOPIC GASTROSTOMY (PEG)  PLACEMENT;  Surgeon: Diamantina Monks, MD;  Location: [facility] ENDOSCOPY;  Service: General;  Laterality: N/A;   RADIOLOGY WITH ANESTHESIA N/A 12/25/2021   Procedure: IR WITH ANESTHESIA;  Surgeon: Radiologist, Medication, MD;  Location: [facility] OR;  Service: Radiology;  Laterality: N/A;   TRANSURETHRAL RESECTION OF PROSTATE N/A 12/29/2021   Procedure: TRANSURETHRAL RESECTION OF THE PROSTATE (TURP);  Surgeon: Crista Elliot, MD;  Location: [facility] OR;  Service: Urology;  Laterality: N/A;   Patient Active Problem List   Diagnosis Date Noted   Encephalopathy acute 01/07/2022   Severe protein-calorie malnutrition (HCC) 01/07/2022   Physical deconditioning 01/07/2022   Diarrhea 01/07/2022   Pericardial effusion    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   Prostate abscess 12/30/2021   Acute kidney injury (HCC) 12/30/2021   Anemia due to GI blood loss    Acute respiratory failure with hypoxia (HCC)    Acute ischemic right MCA stroke (HCC) 12/25/2021   Sinus tachycardia    Chest pain in adult 10/24/2017   Mixed hyperlipidemia 10/24/2017   Rising PSA level 10/24/2017   Impaired fasting glucose 10/24/2017   Elevated BP without diagnosis of hypertension 10/24/2017    Rationale for Evaluation and Treatment: Rehabilitation  ONSET DATE: 06/08/2023 (date of referral)  REFERRING DIAG: I69.30 (ICD-10-CM) - Unspecified sequelae of cerebral infarction R53.1 (ICD-10-CM) - Weakness  THERAPY DIAG:  Other symptoms and signs involving the nervous system  Muscle weakness (generalized)  Other lack of coordination  Other disturbances of skin sensation  SUBJECTIVE:   SUBJECTIVE STATEMENT: No pain today  Pt accompanied by: self  PERTINENT HISTORY: PMH: CVA 12/25/2021, endocarditis, HTN, HLD  MRI from April, 2023 showed subacute ischemia in the right MCA, right occipital lobe, left frontal and parietal white matter and petechial blood in the left parietal lobe   PRECAUTIONS: None  WEIGHT BEARING  RESTRICTIONS No  PAIN:  Are you having pain? Yes: NPRS scale: 0/10 Pain location: L hand Pain description: sore, stiff  FALLS: Has patient fallen in last 6 months? No  LIVING ENVIRONMENT: Lives with: lives alone Lives in: House/apartment Stairs: No Has following equipment at home: None  PLOF: Independent; retired; driving  PATIENT GOALS Improve use of LUE  OBJECTIVE:   HAND DOMINANCE: Right  SENSATION: Occasional paresthesias reported  MUSCLE TONE: LUE: Mild and Hypertonic ; uses Baclofen and heating pad  COGNITION: Overall cognitive status: Within functional limits for tasks assessed  PERCEPTION: Not tested  PRAXIS: Not tested  --------------------------------------------------------------------------------------------------------------------- [name]: IADL Scale  A. Ability to Use Telephone  Operates telephone on own initiative-looks up and dials numbers, etc. - 1  B. Shopping Takes care of all shopping needs independently - 1  C. Food Preparation Plans, prepares and serves adequate meals independently - 1  D. Housekeeping Performs light daily tasks such as dish washing, bed making - 1  E. Laundry  Does Engineer, manufacturing - 1  F. Mode of Transportation  Travels independently on public transportation or drives own car - 1  G. Responsibility for Own Medications Is responsible for taking medication in correct dosages at correct time - 1  H. Ability to [employer] financial matters independently (budgets, writes checks, pays rent, bills, goes to [employer]), collects and keeps track of income - 1    TOTAL LAWTON BRODY SCORE: 8/8  -----------------------------------------------------------  Myrtis Ser Index of Independence in ADL INDEPENDENCE (1) DEPENDENCE (0)  NO supervision, direction or personal assistance WITH supervision, direction, personal assistance or total care   ACTIVITIES 1 or 0  Bathing 1  Dressing 1  Toileting 1  Transferring 1   Continence 1  Feeding 0    KATZ TOTAL: 5/6  -----------------------------------------------------------  FUGL-MEYER ASSESSMENT   A. Upper Extremity  I. Reflex Activity: Flexors (elbow) 2  Extensors (triceps) 2  Total 4/4     II. Volitional Movement within Synergies: Flexor Synergy:   Shoulder Retraction 1  Shoulder Elevation 1  Shoulder Abduction 1  External Rotation 1  Elbow Flexion 1  Forearm Supination 1  Extensor Synergy:   Shoulder Adduction/Internal Rotation 1  Elbow Extension 2  Forearm Pronation 2  Total 11/18   III. Volitional Mixing of Synergies: Hand to Lumbar Spine 1  Shoulder Flexion 1  Pronation/Supination 1  Total 3/6   IV. Volitional Movement with Little to No Synergy Shoulder Abduction 1  Shoulder Flexion 90+ 1  Pronation/Supination 1  Total 3/6    B. Wrist Stability of Wrist at 15* 2  Repeated Flex/Ext 2  Circumduction 1  Stability at 15*Wrist, 0*Elbow 2  Flexion/Extension - Elbow 0* 2  Total 9/10   C. Hand Mass Flexion 1  Mass Extension 1  Total 2/4    D. Grasp Hook  2  Thumb Adduction - paper 0  Pincer - pen 0  Cylindrical - cup 0  Spherical - ball 0  Total 2/10   E. Coordination/Speed Tremor 2  Dysmetria 1  Time 1  Total 4/6    FUGL-MEYER ASSESSMENT: UPPER EXTREMITY A-E TOTAL: 38/66   07/06/23:   Grip strength Lt = 6-8 lbs (placing fingers around dynamometer)  Box & Blocks Lt = unable w/o assist from Rt hand to help open hand and place blocks in hand 9 hole peg Lt = unable  ---------------------------------------------------------------------------------------------------------------------  TODAY'S TREATMENT:                                                                                                                               Adjusted resting hand splint for greater MP flex and IP extension, and to prevent finger deviation radially as able. Demo better fit after adjustments made.  Reviewed proper  donning/doffing and wearing schedule.   Also reviewed proper technique for stretching hand as pt still tends to overstretch/hyperextend MP's with IP extension - pt shown again how to provide some MP flexion with IP extension  Fabricated and fitted taco splint to work on MP flex with IP's extended, however due to spasticity, pt fighting against it and unable to extend MP joints. Therefore instructed pt not to use until hopefully after botox. Pt is scheduled to see Dr. Wynn Banker this week, but will most likely need to cancel next O.T. visit until 7-14 days after botox injections - pt aware      PATIENT EDUCATION: Education details: review of splint wear and care Person educated: Patient Education method: Explanation, demo Education comprehension: verbalized understanding, return demo of donning/doffing  HOME EXERCISE PROGRAM: Review of splint wear and care  GOALS:  SHORT TERM GOALS: Target date: 07/17/2023   Patient will demonstrate independence with initial LUE HEP. Baseline: Goal status: IN PROGRESS  2.  Pt to achieve at least 10 lbs grip strength Lt hand Baseline: 6-8 lbs Goal status: INITIAL  3. Pt to be independent with resting hand splint to position him in IP extension and thumb palmer abduction, and taco splint to keep IP's straight while working on MP flex/ext   Goal status: IN PROGRESS   LONG TERM GOALS: Target date: 08/26/2023  Patient will demonstrate updated LUE HEP with 25% verbal cues or less for proper execution. Baseline:  Goal status: INITIAL  2.  Pt to improve LUE function as evidenced by performing 5 or more blocks on Box & Blocks test Baseline: unable w/o assist from Rt hand Goal status: INITIAL  3.  Pt to demonstrate improved L thumb abduction AROM as needed to position business card between 1st and 2nd digits.  Baseline: unable to complete Goal status: INITIAL  4.  Pt to demonstrate at least 50% AROM L composite extension at IP's as needed to  grasp items in hand. Baseline: <25% Goal status: INITIAL   ASSESSMENT:  CLINICAL IMPRESSION: Patient returns today for O.T. treatment for splinting needs. Pt limited by IP extension of fingers, thumb palmer abd, wrist ext and supination for functional movement. Pt can use Lt hand to hold some light weight things if placed in hand by Rt hand, but cannot release object without removing with other hand. Pt would benefit from botox to FDS/FDP, thumb adduction muscle, pronator muscles.  PERFORMANCE DEFICITS in functional skills including ADLs, IADLs, coordination, dexterity, edema, tone, ROM, strength, Fine motor control, and UE functional use.   IMPAIRMENTS are limiting patient from ADLs, IADLs, and leisure.   COMORBIDITIES may have co-morbidities  that affects occupational performance. Patient will benefit from skilled OT to address above impairments and improve overall function.  MODIFICATION OR ASSISTANCE TO COMPLETE EVALUATION: Min-Moderate modification of tasks or assist with assess necessary to complete an evaluation.  OT OCCUPATIONAL PROFILE AND HISTORY: Problem focused assessment: Including review of records relating to presenting problem.  CLINICAL DECISION MAKING: LOW - limited treatment options, no task modification necessary  REHAB POTENTIAL: Good  EVALUATION COMPLEXITY: Low  PLAN:  Will determine appropriateness of candidacy for Vivistim and reevaluate and develop plan upon return if determined appropriate.  Vivistim Checklist Stroke Chronicity: >6 months post ischemic stroke  Affected Side: left upper extremity  Hand Dominance: right handed prior to CVA; currently right handed  Fugl-Meyer Score (UE): 38/66 (LEAPS Version) indicating moderate to severe impairment  ADL/IADL Status: Pt demonstrates functional deficits with L digit extension, thumb abduction, supination, and spasticity that negatively affects occupational performance and overall level of independence.    Currently, this pt is requiring increased time and compensation with use of unaffected RUE  for activity completion (see Objective section for further details).  Spasticity: Spasticity is not managed well enough to recommend Vivistim at this time. Recommend pt Inquire about Botox and Continue with HEP and re-consult if improvement is achieved.   General Therapy History: Pt has been seen for LUE intervention in the inpatient, SNF, and OP setting(s) to address functional deficits following CVA.   Rehab Potential:  The patient is motivated to perform intensive outpatient occupational therapy.     OT FREQUENCY: 1x/week  OT DURATION: 8 weeks  PLANNED INTERVENTIONS: self care/ADL training, therapeutic exercise, therapeutic activity, neuromuscular re-education, manual therapy, passive range of motion, splinting, electrical stimulation, ultrasound, moist heat, patient/family education, and Re-evaluation  RECOMMENDED OTHER SERVICES: Consult with PM&R for possible Botox  CONSULTED AND AGREED WITH PLAN OF CARE: Patient  PLAN FOR NEXT SESSION: place on hold until after botox injections  Sheran Lawless, OT 07/19/2023, 8:51 AM

## 2023-07-21 ENCOUNTER — Encounter: Payer: Self-pay | Admitting: Physical Medicine & Rehabilitation

## 2023-07-21 ENCOUNTER — Encounter
Payer: Managed Care, Other (non HMO) | Attending: Physical Medicine & Rehabilitation | Admitting: Physical Medicine & Rehabilitation

## 2023-07-21 VITALS — BP 111/78 | HR 89 | Ht 72.0 in | Wt 203.0 lb

## 2023-07-21 DIAGNOSIS — G8110 Spastic hemiplegia affecting unspecified side: Secondary | ICD-10-CM | POA: Insufficient documentation

## 2023-07-21 DIAGNOSIS — R252 Cramp and spasm: Secondary | ICD-10-CM | POA: Diagnosis present

## 2023-07-21 DIAGNOSIS — I69354 Hemiplegia and hemiparesis following cerebral infarction affecting left non-dominant side: Secondary | ICD-10-CM | POA: Insufficient documentation

## 2023-07-21 DIAGNOSIS — I69398 Other sequelae of cerebral infarction: Secondary | ICD-10-CM | POA: Diagnosis present

## 2023-07-21 NOTE — Progress Notes (Signed)
Subjective:    Patient ID: Vincent Ferrell, male    DOB: [birth year], 62 y.o.   MRN: [id number] 62 y/o gentleman with a history of HTN, HLD, GERD who presented with stroke symptoms, found to have MSSA bacteremia and endocarditis causing embolic strokes and prostate abscess.   4/14 Admitted with acute R MCA stroke s/p mechanical thrombectomy with revascularization.  Cards consulted for initial code STEMI. Required 4 units of pRBCs for lower GI bleed.  4/17: Large bloody BM. Bedside colonoscopy with blood throughout colon. TEE with 1 cm x 0.5 cm vegetation involving both atrial and ventricular sides. Hemoglobin decreased to 6.8. Additional 2 units of pRBCs and FFP given.  4/18: Bedside EGD and colonoscopy with evidence of black blood but no source of bleeding identified. MRI of the prostate demonstrated large abscess. Patient taken for drainage by Urology. Cultures obtained.  4/20: TCTS consulted for concern of valvular insufficiency. Repeat echo with increased peri-cardial effusion size.   4/23 MRI brian with expected stroke evolution. 4/24 trach 4/25 PEG, repeat Echocardiogram shows improved pericardial effusion. HPI   Moved from [employer] to [employer] to [employer] to [employer] to [facility] after acute care  Now home independently and driving   Hx PE now off [employer] Average Pain 1 Pain Right Now 1 My pain is intermittent, dull, and aching  LOCATION OF PAIN  wrist, hand, fingers  BOWEL Number of stools per week: 7-8 Oral laxative use Yes  Type of laxative senna Enema or suppository use No  History of colostomy No  Incontinent No   BLADDER Normal In and out cath, frequency . Able to self cath  . Bladder incontinence No  Frequent urination No  Leakage with coughing No  Difficulty starting stream No  Incomplete bladder emptying No    Mobility walk without assistance how many minutes can you walk? 60 ability to climb steps?   yes do you drive?  yes  Function retired  Neuro/Psych No problems in this area  Prior Studies New pt  Physicians involved in your care New pt   Family History  Problem Relation Age of Onset   Stroke Neg Hx    Social History   Socioeconomic History   Marital status: Widowed    Spouse name: Not on file   Number of children: Not on file   Years of education: Not on file   Highest education level: Not on file  Occupational History   Not on file  Tobacco Use   Smoking status: Never   Smokeless tobacco: Never  Substance and Sexual Activity   Alcohol use: Yes    Comment: occ   Drug use: Not on file   Sexual activity: Not on file  Other Topics Concern   Not on file  Social History Narrative   Not on file   Social Determinants of Health   Financial Resource Strain: Low Risk  (11/24/2020)   Received from [facility] visits prior to 11/13/2022., [facility] visits prior to 11/13/2022.   Overall Financial Resource Strain (CARDIA)    Difficulty of Paying Living Expenses: Not hard at all  Food Insecurity: Low Risk  (05/09/2023)   [facility]   Hunger Vital Sign    Worried About Running Out of Food in the Last Year: Never true    Ran Out of Food in the Last Year: Never true  Transportation Needs: No Transportation Needs (05/09/2023)  Received from [facility]    In the past 12 months, has lack of reliable transportation kept you from medical appointments, meetings, work or from getting things needed for daily living? : No  Physical Activity: Insufficiently Active (11/24/2020)   Received from [facility] visits prior to 11/13/2022., [facility] visits prior to 11/13/2022.   Exercise Vital Sign    Days of Exercise per Week: 1 day    Minutes of Exercise per Session: 20 min  Stress: No Stress Concern Present (11/24/2020)   [facility] from [facility] visits prior to 11/13/2022., [facility] visits prior to 11/13/2022.   [employer] of Occupational Health - Occupational Stress Questionnaire    Feeling of Stress : Only a little  Social Connections: Unknown (02/19/2022)   [facility]   Social Network    Social Network: Not on file   Past Surgical History:  Procedure Laterality Date   COLONOSCOPY WITH PROPOFOL N/A 12/28/2021   Procedure: COLONOSCOPY WITH PROPOFOL;  Surgeon: Tressia Danas, MD;  Location: [facility] ENDOSCOPY;  Service: Gastroenterology;  Laterality: N/A;   COLONOSCOPY WITH PROPOFOL N/A 12/29/2021   Procedure: COLONOSCOPY WITH PROPOFOL;  Surgeon: Tressia Danas, MD;  Location: [facility] ENDOSCOPY;  Service: Gastroenterology;  Laterality: N/A;   ESOPHAGOGASTRODUODENOSCOPY (EGD) WITH PROPOFOL N/A 12/29/2021   Procedure: ESOPHAGOGASTRODUODENOSCOPY (EGD) WITH PROPOFOL;  Surgeon: Tressia Danas, MD;  Location: [facility] ENDOSCOPY;  Service: Gastroenterology;  Laterality: N/A;   ESOPHAGOGASTRODUODENOSCOPY (EGD) WITH PROPOFOL  01/05/2022   Procedure: ESOPHAGOGASTRODUODENOSCOPY (EGD) WITH PROPOFOL;  Surgeon: Diamantina Monks, MD;  Location: [facility] ENDOSCOPY;  Service: General;;   IR CT HEAD LTD  12/25/2021   IR PERCUTANEOUS ART THROMBECTOMY/INFUSION INTRACRANIAL INC DIAG ANGIO  12/25/2021   PEG PLACEMENT N/A 01/05/2022   Procedure: PERCUTANEOUS ENDOSCOPIC GASTROSTOMY (PEG) PLACEMENT;  Surgeon: Diamantina Monks, MD;  Location: [facility] ENDOSCOPY;  Service: General;  Laterality: N/A;   RADIOLOGY WITH ANESTHESIA N/A 12/25/2021   Procedure: IR WITH ANESTHESIA;  Surgeon: Radiologist, Medication, MD;  Location: [facility] OR;  Service: Radiology;  Laterality: N/A;   TRANSURETHRAL RESECTION OF PROSTATE N/A 12/29/2021   Procedure: TRANSURETHRAL RESECTION OF THE PROSTATE (TURP);  Surgeon: Crista Elliot, MD;  Location: [facility] OR;  Service: Urology;  Laterality: N/A;   Past Medical History:  Diagnosis Date   Chest pain  in adult 10/24/2017   Elevated BP without diagnosis of hypertension 10/24/2017   Impaired fasting glucose 10/24/2017   Mixed hyperlipidemia 10/24/2017   Multifocal pneumonia 12/30/2021   Rising PSA level 10/24/2017   There were no vitals taken for this visit.  Opioid Risk Score:   Fall Risk Score:  `1  Depression screen PHQ 2/9      No data to display           Review of Systems  All other systems reviewed and are negative.     Objective:   Physical Exam  LUE Delt 3- Bi 3- Tri 2-  FF trace on Left  FE 0/5 3- WE/WF  Tone MAS EF 2 WF 1 FDP 3 FDS 3 PT 3      Assessment & Plan:  1.  History of right MCA distribution infarct due to infectious endocarditis in 2023 has had a very good recovery but still has spastic hemiparesis primarily affecting left upper extremity.  He has gone through extensive inpatient as well as outpatient rehab but continues to have significant  tone in the finger flexors thumb flexors as well as the pronator on the left side.  The patient would benefit from botulinum toxin injection.  We discussed the pros and cons including potential for increased weakness.  We discussed that the usual onset is 1 week postinjection and the duration of effect is approximately 3 months.  We also discussed the possible need for fine-tuning dosing including muscle group selection.  He will be back after getting insurance approval for this.  Initial plan is for 100 units Xeomin or Botox.  Left  FPL 25 FDS 25 FDP 25 PT 25

## 2023-07-22 ENCOUNTER — Encounter: Payer: Self-pay | Admitting: Physical Medicine & Rehabilitation

## 2023-07-22 ENCOUNTER — Encounter: Payer: Self-pay | Admitting: Occupational Therapy

## 2023-07-26 ENCOUNTER — Encounter: Payer: Managed Care, Other (non HMO) | Admitting: Occupational Therapy

## 2023-07-29 ENCOUNTER — Encounter: Payer: Self-pay | Admitting: Physical Medicine & Rehabilitation

## 2023-07-29 ENCOUNTER — Encounter (HOSPITAL_BASED_OUTPATIENT_CLINIC_OR_DEPARTMENT_OTHER): Payer: Managed Care, Other (non HMO) | Admitting: Physical Medicine & Rehabilitation

## 2023-07-29 VITALS — BP 108/70 | HR 64 | Ht 72.0 in | Wt 203.0 lb

## 2023-07-29 DIAGNOSIS — G8114 Spastic hemiplegia affecting left nondominant side: Secondary | ICD-10-CM | POA: Diagnosis not present

## 2023-07-29 DIAGNOSIS — G8110 Spastic hemiplegia affecting unspecified side: Secondary | ICD-10-CM | POA: Insufficient documentation

## 2023-07-29 DIAGNOSIS — I69354 Hemiplegia and hemiparesis following cerebral infarction affecting left non-dominant side: Secondary | ICD-10-CM | POA: Diagnosis not present

## 2023-07-29 MED ORDER — ONABOTULINUMTOXINA 100 UNITS IJ SOLR
100.0000 [IU] | Freq: Once | INTRAMUSCULAR | Status: AC
Start: 2023-07-29 — End: 2023-07-29
  Administered 2023-07-29: 100 [IU] via INTRAMUSCULAR

## 2023-07-29 NOTE — Addendum Note (Signed)
Addended by: Silas Sacramento T on: 07/29/2023 11:19 AM   Modules accepted: Orders

## 2023-07-29 NOTE — Progress Notes (Signed)
  Botox Injection for spasticity using needle EMG guidance  Dilution: 50 Units/ml Indication: Severe spasticity which interferes with ADL,mobility and/or  hygiene and is unresponsive to medication management and other conservative care Informed consent was obtained after describing risks and benefits of the procedure with the patient. This includes bleeding, bruising, infection, excessive weakness, or medication side effects. A REMS form is on file and signed. Needle: 27g 1" needle electrode Number of units per muscle Left  FPL 25 FDS 25 FDP 25 PT 25 All injections were done after obtaining appropriate EMG activity and after negative drawback for blood. The patient tolerated the procedure well. Post procedure instructions were given. A followup appointment was made.

## 2023-07-29 NOTE — Patient Instructions (Signed)

## 2023-08-02 ENCOUNTER — Encounter: Payer: Managed Care, Other (non HMO) | Admitting: Occupational Therapy

## 2023-08-08 ENCOUNTER — Emergency Department (HOSPITAL_BASED_OUTPATIENT_CLINIC_OR_DEPARTMENT_OTHER)
Admission: EM | Admit: 2023-08-08 | Discharge: 2023-08-08 | Disposition: A | Discharge status: Home / Self Care | Payer: Managed Care, Other (non HMO) | Attending: Emergency Medicine | Admitting: Emergency Medicine

## 2023-08-08 ENCOUNTER — Emergency Department (HOSPITAL_BASED_OUTPATIENT_CLINIC_OR_DEPARTMENT_OTHER): Payer: Managed Care, Other (non HMO)

## 2023-08-08 ENCOUNTER — Encounter (HOSPITAL_BASED_OUTPATIENT_CLINIC_OR_DEPARTMENT_OTHER): Payer: Self-pay | Admitting: Emergency Medicine

## 2023-08-08 ENCOUNTER — Other Ambulatory Visit: Payer: Self-pay

## 2023-08-08 DIAGNOSIS — X501XXA Overexertion from prolonged static or awkward postures, initial encounter: Secondary | ICD-10-CM | POA: Diagnosis not present

## 2023-08-08 DIAGNOSIS — Y9222 Religious institution as the place of occurrence of the external cause: Secondary | ICD-10-CM | POA: Diagnosis not present

## 2023-08-08 DIAGNOSIS — Z79899 Other long term (current) drug therapy: Secondary | ICD-10-CM | POA: Insufficient documentation

## 2023-08-08 DIAGNOSIS — Z8673 Personal history of transient ischemic attack (TIA), and cerebral infarction without residual deficits: Secondary | ICD-10-CM | POA: Insufficient documentation

## 2023-08-08 DIAGNOSIS — Z7982 Long term (current) use of aspirin: Secondary | ICD-10-CM | POA: Diagnosis not present

## 2023-08-08 DIAGNOSIS — M25572 Pain in left ankle and joints of left foot: Secondary | ICD-10-CM | POA: Diagnosis present

## 2023-08-08 DIAGNOSIS — I10 Essential (primary) hypertension: Secondary | ICD-10-CM | POA: Insufficient documentation

## 2023-08-08 DIAGNOSIS — S93492A Sprain of other ligament of left ankle, initial encounter: Secondary | ICD-10-CM | POA: Diagnosis not present

## 2023-08-08 DIAGNOSIS — S93402A Sprain of unspecified ligament of left ankle, initial encounter: Secondary | ICD-10-CM

## 2023-08-08 HISTORY — DX: Essential (primary) hypertension: I10

## 2023-08-08 HISTORY — DX: Cerebral infarction, unspecified: I63.9

## 2023-08-08 HISTORY — DX: Gastro-esophageal reflux disease without esophagitis: K21.9

## 2023-08-08 NOTE — ED Notes (Signed)
Fall risk armband Fall risk socks Fall risk sign on door

## 2023-08-08 NOTE — ED Provider Notes (Signed)
[place] [facility] Provider Note  CSN: [id number] Arrival date & time: 08/08/23 1005  Chief Complaint(s) Ankle Pain  HPI Vincent Ferrell is a 62 y.o. male with past medical history as below, significant for prior CVA with left-sided residual deficit, hypertension, GERD, hyperlipidemia who presents to the ED with complaint of left ankle pain  Patient reports he [place] yesterday, twisted his left ankle.  No head injury, no LOC.  He has been ambulatory with some discomfort to his ankle.  No pain to ipsilateral knee or hip.  No numbness.  Has noticed some mild swelling to lateral aspect of his left ankle.  Pain well-controlled with Motrin.  He has been using Ace wrap at home  Past Medical History Past Medical History:  Diagnosis Date   Chest pain in adult 10/24/2017   Elevated BP without diagnosis of hypertension 10/24/2017   GERD (gastroesophageal reflux disease)    Hypertension    Impaired fasting glucose 10/24/2017   Mixed hyperlipidemia 10/24/2017   Multifocal pneumonia 12/30/2021   Rising PSA level 10/24/2017   Stroke [facility])    Patient Active Problem List   Diagnosis Date Noted   Spastic hemiplegia affecting nondominant side (HCC) 07/29/2023   Encephalopathy acute 01/07/2022   Severe protein-calorie malnutrition (HCC) 01/07/2022   Physical deconditioning 01/07/2022   Diarrhea 01/07/2022   Pericardial effusion    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 Authorizing Provider  ascorbic acid (VITAMIN C) 500 MG tablet Take by mouth.    [provider]  aspirin EC 81 MG tablet Take by mouth.    [provider]  atorvastatin (LIPITOR) 40 MG tablet Place 1 tablet (40 mg total) into feeding tube daily. 01/15/22   Rolly Salter, MD  Baclofen 5 MG TABS Take 5 mg by mouth 3 (three) times daily. 10/15/22   [provider]  diclofenac Sodium (VOLTAREN ARTHRITIS PAIN) 1 % GEL Apply topically daily.    [provider]  gabapentin (NEURONTIN) 100 MG capsule Take 100 mg by mouth 3 (three) times daily. Patient not taking: Reported on 06/16/2023 03/01/22   [provider]  Lactobacillus-Inulin (CULTURELLE DIGESTIVE DAILY PO) Take by mouth daily.    [provider]  lisinopril (ZESTRIL) 10 MG tablet Place 1 tablet (10 mg total) into feeding tube daily. 01/15/22   Rolly Salter, MD  MELATONIN PO Take by mouth as needed.    [provider]  metoprolol tartrate (LOPRESSOR) 25 MG tablet Take 25 mg by mouth 2 (two) times daily. 02/28/22   [provider]  Multiple Vitamin (MULTI-VITAMIN) tablet Take 1 tablet by mouth daily.    [provider]  pantoprazole sodium (PROTONIX) 40 mg Place 40 mg into feeding tube 2 (two) times daily. 01/15/22   Rolly Salter, MD  Sennosides (SENOKOT PO) Take by mouth daily at 2 PM.    [provider]  tamsulosin [facility])  0.4 MG CAPS capsule Take by mouth. 02/28/22   [provider]                                                                                                                                    Past Surgical History Past Surgical History:  Procedure Laterality Date   COLONOSCOPY WITH PROPOFOL N/A 12/28/2021   Procedure: COLONOSCOPY WITH PROPOFOL;  Surgeon: Tressia Danas, MD;  Location: [facility] ENDOSCOPY;  Service: Gastroenterology;  Laterality: N/A;   COLONOSCOPY WITH PROPOFOL  N/A 12/29/2021   Procedure: COLONOSCOPY WITH PROPOFOL;  Surgeon: Tressia Danas, MD;  Location: [facility] ENDOSCOPY;  Service: Gastroenterology;  Laterality: N/A;   ESOPHAGOGASTRODUODENOSCOPY (EGD) WITH PROPOFOL N/A 12/29/2021   Procedure: ESOPHAGOGASTRODUODENOSCOPY (EGD) WITH PROPOFOL;  Surgeon: Tressia Danas, MD;  Location: [facility] ENDOSCOPY;  Service: Gastroenterology;  Laterality: N/A;   ESOPHAGOGASTRODUODENOSCOPY (EGD) WITH PROPOFOL  01/05/2022   Procedure: ESOPHAGOGASTRODUODENOSCOPY (EGD) WITH PROPOFOL;  Surgeon: Diamantina Monks, MD;  Location: [facility] ENDOSCOPY;  Service: General;;   IR CT HEAD LTD  12/25/2021   IR PERCUTANEOUS ART THROMBECTOMY/INFUSION INTRACRANIAL INC DIAG ANGIO  12/25/2021   PEG PLACEMENT N/A 01/05/2022   Procedure: PERCUTANEOUS ENDOSCOPIC GASTROSTOMY (PEG) PLACEMENT;  Surgeon: Diamantina Monks, MD;  Location: [facility] ENDOSCOPY;  Service: General;  Laterality: N/A;   RADIOLOGY WITH ANESTHESIA N/A 12/25/2021   Procedure: IR WITH ANESTHESIA;  Surgeon: Radiologist, Medication, MD;  Location: [facility] OR;  Service: Radiology;  Laterality: N/A;   TRANSURETHRAL RESECTION OF PROSTATE N/A 12/29/2021   Procedure: TRANSURETHRAL RESECTION OF THE PROSTATE (TURP);  Surgeon: Crista Elliot, MD;  [facility] OR;  Service: Urology;  Laterality: N/A;   Family History Family History  Problem Relation Age of Onset   Stroke Neg Hx     Social History Social History   Tobacco Use   Smoking status: Never    Passive exposure: Never   Smokeless tobacco: Never  Vaping Use   Vaping status: Never Used  Substance Use Topics   Alcohol use: Yes    Comment: occ   Drug use: Never   Allergies Pollen extract and Shellfish allergy  Review of Systems Review of Systems  Constitutional:  Negative for chills and fever.  Respiratory:  Negative for shortness of breath.   Cardiovascular:  Negative for chest pain.  Gastrointestinal:  Negative for abdominal pain.  Musculoskeletal:  Positive for arthralgias.   Neurological:  Negative for weakness.  All other systems reviewed and are negative.   Physical Exam Vital Signs  I have reviewed the triage vital signs BP 127/88   Pulse (!) 52   Temp 97.7 F (36.5 C)   Resp 18   Ht 6\' 2"  (1.88 m)   Wt 99.8 kg   SpO2 95%   BMI 28.25 kg/m  Physical Exam Vitals and nursing note reviewed.  Constitutional:      General: He is not in acute distress.    Appearance:  Normal appearance. He is well-developed. He is not ill-appearing.  HENT:     Head: Normocephalic and atraumatic.     Right Ear: External ear normal.     Left Ear: External ear normal.     Nose: Nose normal.     Mouth/Throat:     Mouth: Mucous membranes are moist.  Eyes:     General: No scleral icterus.       Right eye: No discharge.        Left eye: No discharge.  Cardiovascular:     Rate and Rhythm: Normal rate.  Pulmonary:     Effort: Pulmonary effort is normal. No respiratory distress.     Breath sounds: No stridor.  Abdominal:     General: Abdomen is flat. There is no distension.     Tenderness: There is no guarding.  Musculoskeletal:        General: No deformity.     Cervical back: No rigidity.       Feet:     Comments: Achilles, quadriceps and patella tendons intact bilateral.  No pain to ipsilateral knee or hip, no fibular head tenderness.  LE NVI.  Skin:    General: Skin is warm and dry.     Coloration: Skin is not cyanotic, jaundiced or pale.  Neurological:     Mental Status: He is alert and oriented to person, place, and time.     GCS: GCS eye subscore is 4. GCS verbal subscore is 5. GCS motor subscore is 6.     Comments: Residual left-sided neurodeficits, actually improved from prior  Psychiatric:        Speech: Speech normal.        Behavior: Behavior normal. Behavior is cooperative.     ED Results and Treatments Labs (all labs ordered are listed, but only abnormal results are displayed) Labs Reviewed - No data to display                                                                                                                         Radiology DG Ankle Complete Left  Result Date: 08/08/2023 CLINICAL DATA:  62 year old male status post fall yesterday.  Pain. EXAM: LEFT ANKLE COMPLETE - 3+ VIEW COMPARISON:  Left ankle series 01/21/2022. FINDINGS: Three views of the left ankle. Bone mineralization is within normal limits. Maintained alignment. Talar dome intact. No evidence of joint effusion. Calcaneus appears intact. Distal tibia and fibula appear stable and intact. No acute osseous abnormality identified. IMPRESSION: No acute fracture or dislocation identified about the left ankle. Electronically Signed   By: Odessa Fleming M.D.   On: 08/08/2023 11:01    Pertinent labs & imaging results that were available during my care of the patient were reviewed by me and considered in my medical decision making (see MDM for details).  Medications Ordered in ED Medications - No data to display  Procedures Procedures  (including critical care time)  Medical Decision Making / ED Course    Medical Decision Making:    Tayyab Rapp is a 62 y.o. male with past medical history as below, significant for prior CVA with left-sided residual deficit, hypertension, GERD, hyperlipidemia who presents to the ED with complaint of left ankle pain. The complaint involves an extensive differential diagnosis and also carries with it a high risk of complications and morbidity.  Serious etiology was considered. Ddx includes but is not limited to: Sprain, strain, dislocation, fracture, soft tissue injury, etc.  Complete initial physical exam performed, notably the patient was in no acute distress, sitting upright.    Reviewed and confirmed nursing documentation for past medical history, family history, social history.  Vital signs reviewed.     Patient here with injury to his left ankle.  No injury to knee or hip.  Will get x-ray     Brief summary: 62 year old male history of prior stroke with left-sided deficit here with ankle injury.  X-ray stable.  Exam is stable.  Likely ankle sprain.  Ace wrap, cane for home; cannot safely use crutches given prior left-sided stroke deficits.  Follow-up with PCP, RICE protocol  The patient improved significantly and was discharged in stable condition. Detailed discussions were had with the patient regarding current findings, and need for close f/u with PCP or on call doctor. The patient has been instructed to return immediately if the symptoms worsen in any way for re-evaluation. Patient verbalized understanding and is in agreement with current care plan. All questions answered prior to discharge.                  Additional history obtained: -Additional history obtained from na -External records from outside source obtained and reviewed including: Chart review including previous notes, labs, imaging, consultation notes including  Primary care documentation, home medications, prior labs and imaging  Lab Tests: na  EKG   EKG Interpretation Date/Time:    Ventricular Rate:    PR Interval:    QRS Duration:    QT Interval:    QTC Calculation:   R Axis:      Text Interpretation:           Imaging Studies ordered: I ordered imaging studies including ankle x-ray I independently visualized the following imaging with scope of interpretation limited to determining acute life threatening conditions related to emergency care; findings noted above I independently visualized and interpreted imaging. I agree with the radiologist interpretation   Medicines ordered and prescription drug management: No orders of the defined types were placed in this encounter.   -I have reviewed the patients home medicines and have made adjustments as needed   Consultations  Obtained: na   Cardiac Monitoring: Continuous pulse oximetry interpreted by myself, 97% on RA.    Social Determinants of Health:  Diagnosis or treatment significantly limited by social determinants of health: lives at home   Reevaluation: After the interventions noted above, I reevaluated the patient and found that they have improved  Co morbidities that complicate the patient evaluation  Past Medical History:  Diagnosis Date   Chest pain in adult 10/24/2017   Elevated BP without diagnosis of hypertension 10/24/2017   GERD (gastroesophageal reflux disease)    Hypertension    Impaired fasting glucose 10/24/2017   Mixed hyperlipidemia 10/24/2017   Multifocal pneumonia 12/30/2021   Rising PSA level 10/24/2017   Stroke [facility])       Dispostion: Disposition decision including  need for hospitalization was considered, and patient discharged from emergency department.    Final Clinical Impression(s) / ED Diagnoses Final diagnoses:  Sprain of left ankle, unspecified ligament, initial encounter        Sloan Leiter, DO 08/08/23 1206

## 2023-08-08 NOTE — ED Triage Notes (Signed)
Twisted left ankle after falling at [place] yesterday  hurts [place]

## 2023-08-08 NOTE — Discharge Instructions (Addendum)
It was a pleasure caring for you today in the emergency department.  Please return to the emergency department for any worsening or worrisome symptoms.

## 2023-08-09 ENCOUNTER — Ambulatory Visit: Payer: Managed Care, Other (non HMO) | Admitting: Occupational Therapy

## 2023-08-09 ENCOUNTER — Encounter: Payer: Managed Care, Other (non HMO) | Admitting: Occupational Therapy

## 2023-08-16 ENCOUNTER — Encounter: Payer: Managed Care, Other (non HMO) | Admitting: Occupational Therapy

## 2023-08-16 ENCOUNTER — Telehealth: Payer: Self-pay | Admitting: Occupational Therapy

## 2023-08-16 ENCOUNTER — Ambulatory Visit: Payer: Managed Care, Other (non HMO) | Attending: Family Medicine | Admitting: Occupational Therapy

## 2023-08-16 ENCOUNTER — Encounter: Payer: Self-pay | Admitting: Occupational Therapy

## 2023-08-16 DIAGNOSIS — R29818 Other symptoms and signs involving the nervous system: Secondary | ICD-10-CM | POA: Insufficient documentation

## 2023-08-16 DIAGNOSIS — R208 Other disturbances of skin sensation: Secondary | ICD-10-CM | POA: Diagnosis present

## 2023-08-16 DIAGNOSIS — M6281 Muscle weakness (generalized): Secondary | ICD-10-CM | POA: Insufficient documentation

## 2023-08-16 DIAGNOSIS — R278 Other lack of coordination: Secondary | ICD-10-CM | POA: Diagnosis present

## 2023-08-16 NOTE — Telephone Encounter (Signed)
Hello Dr. Wynn Banker,   Lorain Childes - I saw Vincent Ferrell today for first visit following botox injections. Noted some improvements in forearm supination but remains limited in PIP extension of digits and thumb palmer abduction. At next scheduled botox injections, pt may benefit from higher doses to FDS and FPL. We will probably d/c within next 1-3 visits, however pt would possibly benefit from return to [facility].T. following another round of botox in Feb/March timeframe.  Thanks,   Jene Every, OTR/L

## 2023-08-16 NOTE — Therapy (Signed)
OUTPATIENT OCCUPATIONAL THERAPY TREATMENT  Patient Name: Vincent Ferrell MRN: [id number] DOB:07-Aug-1961, 62 y.o., male Today's Date: 08/16/2023  PCP: Toy Care, DO REFERRING PROVIDER: Toy Care, DO   OT End of Session - 08/16/23 1108     Visit Number 5    Number of Visits 9    Date for OT Re-Evaluation 08/26/23    Authorization Type [employer] - 90 OT, PT, ST VL combined    OT Start Time 1105    OT Stop Time 1145    OT Time Calculation (min) 40 min    Activity Tolerance Patient tolerated treatment well    Behavior During Therapy [facility] for tasks assessed/performed            Past Medical History:  Diagnosis Date   Chest pain in adult 10/24/2017   Elevated BP without diagnosis of hypertension 10/24/2017   GERD (gastroesophageal reflux disease)    Hypertension    Impaired fasting glucose 10/24/2017   Mixed hyperlipidemia 10/24/2017   Multifocal pneumonia 12/30/2021   Rising PSA level 10/24/2017   [facility]roke [facility])    Past Surgical History:  Procedure Laterality Date   COLONOSCOPY WITH PROPOFOL N/A 12/28/2021   Procedure: COLONOSCOPY WITH PROPOFOL;  Surgeon: Tressia Danas, MD;  Location: [facility] ENDOSCOPY;  Service: Gastroenterology;  Laterality: N/A;   COLONOSCOPY WITH PROPOFOL N/A 12/29/2021   Procedure: COLONOSCOPY WITH PROPOFOL;  Surgeon: Tressia Danas, MD;  Location: [facility] ENDOSCOPY;  Service: Gastroenterology;  Laterality: N/A;   ESOPHAGOGASTRODUODENOSCOPY (EGD) WITH PROPOFOL N/A 12/29/2021   Procedure: ESOPHAGOGASTRODUODENOSCOPY (EGD) WITH PROPOFOL;  Surgeon: Tressia Danas, MD;  Location: [facility] ENDOSCOPY;  Service: Gastroenterology;  Laterality: N/A;   ESOPHAGOGASTRODUODENOSCOPY (EGD) WITH PROPOFOL  01/05/2022   Procedure: ESOPHAGOGASTRODUODENOSCOPY (EGD) WITH PROPOFOL;  Surgeon: Diamantina Monks, MD;  Location: [facility] ENDOSCOPY;  Service: General;;   IR CT HEAD LTD  12/25/2021   IR PERCUTANEOUS ART THROMBECTOMY/INFUSION INTRACRANIAL INC DIAG ANGIO  12/25/2021   PEG  PLACEMENT N/A 01/05/2022   Procedure: PERCUTANEOUS ENDOSCOPIC GASTROSTOMY (PEG) PLACEMENT;  Surgeon: Diamantina Monks, MD;  Location: [facility] ENDOSCOPY;  Service: General;  Laterality: N/A;   RADIOLOGY WITH ANESTHESIA N/A 12/25/2021   Procedure: IR WITH ANESTHESIA;  Surgeon: Radiologist, Medication, MD;  Location: [facility] OR;  Service: Radiology;  Laterality: N/A;   TRANSURETHRAL RESECTION OF PROSTATE N/A 12/29/2021   Procedure: TRANSURETHRAL RESECTION OF THE PROSTATE (TURP);  Surgeon: Crista Elliot, MD;  Location: [facility] OR;  Service: Urology;  Laterality: N/A;   Patient Active Problem List   Diagnosis Date Noted   Spastic hemiplegia affecting nondominant side (HCC) 07/29/2023   Encephalopathy acute 01/07/2022   Severe protein-calorie malnutrition (HCC) 01/07/2022   Physical deconditioning 01/07/2022   Diarrhea 01/07/2022   Pericardial effusion    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   Prostate abscess 12/30/2021   Acute kidney injury (HCC) 12/30/2021   Anemia due to GI blood loss    Acute respiratory failure with hypoxia (HCC)    Acute ischemic right MCA stroke (HCC) 12/25/2021   Sinus tachycardia    Chest pain in adult 10/24/2017   Mixed hyperlipidemia 10/24/2017   Rising PSA level 10/24/2017   Impaired fasting glucose 10/24/2017   Elevated BP without diagnosis of hypertension 10/24/2017    Rationale for Evaluation and Treatment: Rehabilitation  ONSET DATE: 06/08/2023 (date of referral)  REFERRING DIAG: I69.30 (ICD-10-CM) - Unspecified sequelae of cerebral infarction R53.1 (ICD-10-CM) - Weakness  THERAPY DIAG:  Other symptoms and signs involving the nervous system  Muscle weakness (generalized)  Other lack of coordination  Other disturbances of skin sensation  SUBJECTIVE:   SUBJECTIVE STATEMENT: I fell 2 Sunday's ago but no broken bones just a sprained ankle Lt side. I got the botox on 07/29/23.   Pt accompanied by: self  PERTINENT HISTORY: PMH: CVA 12/25/2021,  endocarditis, HTN, HLD  MRI from April, 2023 showed subacute ischemia in the right MCA, right occipital lobe, left frontal and parietal white matter and petechial blood in the left parietal lobe   PRECAUTIONS: None  WEIGHT BEARING RESTRICTIONS No  PAIN:  Are you having pain? Yes: NPRS scale: 0/10 Pain location: L hand Pain description: sore, stiff  FALLS: Has patient fallen in last 6 months? No  LIVING ENVIRONMENT: Lives with: lives alone Lives in: House/apartment Stairs: No Has following equipment at home: None  PLOF: Independent; retired; driving  PATIENT GOALS Improve use of LUE  OBJECTIVE:   HAND DOMINANCE: Right  SENSATION: Occasional paresthesias reported  MUSCLE TONE: LUE: Mild and Hypertonic ; uses Baclofen and heating pad  COGNITION: Overall cognitive status: Within functional limits for tasks assessed  PERCEPTION: Not tested  PRAXIS: Not tested  --------------------------------------------------------------------------------------------------------------------- [name]: IADL Scale  A. Ability to Use Telephone  Operates telephone on own initiative-looks up and dials numbers, etc. - 1  B. Shopping Takes care of all shopping needs independently - 1  C. Food Preparation Plans, prepares and serves adequate meals independently - 1  D. Housekeeping Performs light daily tasks such as dish washing, bed making - 1  E. Laundry  Does Engineer, manufacturing - 1  F. Mode of Transportation  Travels independently on public transportation or drives own car - 1  G. Responsibility for Own Medications Is responsible for taking medication in correct dosages at correct time - 1  H. Ability to [employer] financial matters independently (budgets, writes checks, pays rent, bills, goes to [employer]), collects and keeps track of income - 1    TOTAL LAWTON BRODY SCORE: 8/8  -----------------------------------------------------------  Myrtis Ser Index of  Independence in ADL INDEPENDENCE (1) DEPENDENCE (0)  NO supervision, direction or personal assistance WITH supervision, direction, personal assistance or total care   ACTIVITIES 1 or 0  Bathing 1  Dressing 1  Toileting 1  Transferring 1  Continence 1  Feeding 0    KATZ TOTAL: 5/6  -----------------------------------------------------------  FUGL-MEYER ASSESSMENT   A. Upper Extremity  I. Reflex Activity: Flexors (elbow) 2  Extensors (triceps) 2  Total 4/4     II. Volitional Movement within Synergies: Flexor Synergy:   Shoulder Retraction 1  Shoulder Elevation 1  Shoulder Abduction 1  External Rotation 1  Elbow Flexion 1  Forearm Supination 1  Extensor Synergy:   Shoulder Adduction/Internal Rotation 1  Elbow Extension 2  Forearm Pronation 2  Total 11/18   III. Volitional Mixing of Synergies: Hand to Lumbar Spine 1  Shoulder Flexion 1  Pronation/Supination 1  Total 3/6   IV. Volitional Movement with Little to No Synergy Shoulder Abduction 1  Shoulder Flexion 90+ 1  Pronation/Supination 1  Total 3/6    B. Wrist Stability of Wrist at 15* 2  Repeated Flex/Ext 2  Circumduction 1  Stability at 15*Wrist, 0*Elbow 2  Flexion/Extension - Elbow 0* 2  Total 9/10   C. Hand Mass Flexion 1  Mass Extension 1  Total 2/4    D. Grasp Hook  2  Thumb Adduction - paper 0  Pincer - pen 0  Cylindrical - cup 0  Spherical - ball 0  Total 2/10   E. Coordination/Speed Tremor 2  Dysmetria 1  Time 1  Total 4/6    FUGL-MEYER ASSESSMENT: UPPER EXTREMITY A-E TOTAL: 38/66   07/06/23:   Grip strength Lt = 6-8 lbs (placing fingers around dynamometer)  Box & Blocks Lt = unable w/o assist from Rt hand to help open hand and place blocks in hand 9 hole peg Lt = unable  ---------------------------------------------------------------------------------------------------------------------  TODAY'S TREATMENT:                                                                                                                                Pt returns today for O.T. after botox injections to Lt forearm/hand on 07/29/23 with some improvements in forearm supination but remains limited in PIP extension of digits and thumb palmer abduction. At next scheduled botox injections, pt may benefit from higher doses to FDS and FPL.   Attempted taco splint for isolated MP flexion while keeping IP's straight but still limited by spasticity and little MP movement noted. Therefore, told to continue hold on this  Pt shown neuro glove on [employer] and possible benefits as well as challenges with donning I'ly. Pt decided to purchase with [employer] funds. To arrive this weekend and pt to bring to next session for fitting/training as able. Pt can return if it does not work for him. Hopefully this will assist with gross composite flex and ext of Lt hand (particularly with PIP extension).   Pt also issued handouts on recommended A/E for safety with meal prep and cooking including: rocker knife, one handed cutting board, and pot stabilizer for stovetop cooking. Pt has adapted shoelaces but also provided pt with other options for shoes. Pt also has electric can opener and jar opener that he can operate one handed.   Also began assessing goals in prep for potential d/c or renewal next session  Pt issued shoulder HEP - see pt instructions for details.    PATIENT EDUCATION: Education details: see above Person educated: Patient Education method: Explanation, demo, handouts Education comprehension: verbalized understanding  HOME EXERCISE PROGRAM: Review of splint wear and care 08/16/23: shoulder HEP, A/E recommendations  GOALS:  SHORT TERM GOALS: Target date: 07/17/2023   Patient will demonstrate independence with initial LUE HEP. Baseline: Goal status: MET  2.  Pt to achieve at least 10 lbs grip strength Lt hand Baseline: 6-8 lbs Goal status: DEFERRED - focus is more on extension  3. Pt to be  independent with resting hand splint to position him in IP extension and thumb palmer abduction, and taco splint to keep IP's straight while working on MP flex/ext   Goal status: MET (taco splint not recommended right now)    LONG TERM GOALS: Target date: 08/26/2023  Patient will demonstrate updated LUE HEP with 25% verbal cues or less for proper execution. Baseline:  Goal status: INITIAL  2.  Pt to improve LUE function as evidenced by performing 5 or more  blocks on Box & Blocks test Baseline: unable w/o assist from Rt hand Goal status: INITIAL  3.  Pt to demonstrate improved L thumb abduction AROM as needed to position business card between 1st and 2nd digits.  Baseline: unable to complete Goal status: NOT MET  4.  Pt to demonstrate at least 50% AROM L composite extension at IP's as needed to grasp items in hand. Baseline: <25% Goal status: NOT MET    ASSESSMENT:  CLINICAL IMPRESSION: Patient returns today s/p botox injection Lt forearm/hand. Pt remains limited by IP extension of fingers, thumb palmer abd, but some improvement in supination for functional movement. Pt can use Lt hand to hold some light weight things if placed in hand by Rt hand, but cannot release object without removing with other hand. Discussed neuro glove today for possible neuro re-educ of finger extension and pt decided to purchase and will bring device next session  PERFORMANCE DEFICITS in functional skills including ADLs, IADLs, coordination, dexterity, edema, tone, ROM, strength, Fine motor control, and UE functional use.   IMPAIRMENTS are limiting patient from ADLs, IADLs, and leisure.   COMORBIDITIES may have co-morbidities  that affects occupational performance. Patient will benefit from skilled OT to address above impairments and improve overall function.  MODIFICATION OR ASSISTANCE TO COMPLETE EVALUATION: Min-Moderate modification of tasks or assist with assess necessary to complete an  evaluation.  OT OCCUPATIONAL PROFILE AND HISTORY: Problem focused assessment: Including review of records relating to presenting problem.  CLINICAL DECISION MAKING: LOW - limited treatment options, no task modification necessary  REHAB POTENTIAL: Good  EVALUATION COMPLEXITY: Low  PLAN:  Will continue to monitor appropriateness of candidacy for Vivistim and reevaluate and develop plan upon return if determined appropriate.  Vivistim Checklist Stroke Chronicity: >6 months post ischemic stroke  Affected Side: left upper extremity  Hand Dominance: right handed prior to CVA; currently right handed  Fugl-Meyer Score (UE): 38/66 (LEAPS Version) indicating moderate to severe impairment  ADL/IADL Status: Pt demonstrates functional deficits with L digit extension, thumb abduction, supination, and spasticity that negatively affects occupational performance and overall level of independence.   Currently, this pt is requiring increased time and compensation with use of unaffected RUE  for activity completion (see Objective section for further details).  Spasticity: Spasticity is not managed well enough to recommend Vivistim at this time. Recommend pt Inquire about Botox and Continue with HEP and re-consult if improvement is achieved.   General Therapy History: Pt has been seen for LUE intervention in the inpatient, SNF, and OP setting(s) to address functional deficits following CVA.   Rehab Potential:  The patient is motivated to perform intensive outpatient occupational therapy.     OT FREQUENCY: 1x/week  OT DURATION: 8 weeks  PLANNED INTERVENTIONS: self care/ADL training, therapeutic exercise, therapeutic activity, neuromuscular re-education, manual therapy, passive range of motion, splinting, electrical stimulation, ultrasound, moist heat, patient/family education, and Re-evaluation  RECOMMENDED OTHER SERVICES: Consult with PM&R for possible Botox  CONSULTED AND AGREED WITH PLAN OF CARE:  Patient  PLAN FOR NEXT SESSION: pt to bring in neuro glove to see if appropriate and if so, for fitting/training, otherwise will return device. Also review shoulder HEP and remaining goals. If pt needs further education on neuro glove - pt will need renewal next visit, however may d/c if unable to use or pt does not need further training. Plan to possibly bring pt back in 3 months after another botox injection w/ recommendations to increase dosage on FDS and FPL (will  send these recommendations to MD)   Sheran Lawless, OT 08/16/2023, 11:08 AM

## 2023-08-16 NOTE — Patient Instructions (Signed)
ELBOW: Extension / Chest Press (Frame)    Lie on back with knees bent. Straighten elbows to raise dowel. Hold __3_ seconds. 10___ reps per set, _2__ sets per day   SUPINE: Shoulder Flexion Bilateral (Cane)    Lie on back with knees bent. Hold cane with both hands. Raise both arms overhead, keep elbows straight. _10__ reps per set, _2__ sets per day

## 2023-08-23 ENCOUNTER — Ambulatory Visit: Payer: Managed Care, Other (non HMO) | Admitting: Occupational Therapy

## 2023-08-23 ENCOUNTER — Encounter: Payer: Managed Care, Other (non HMO) | Admitting: Occupational Therapy

## 2023-08-23 ENCOUNTER — Encounter: Payer: Self-pay | Admitting: Occupational Therapy

## 2023-08-23 DIAGNOSIS — M6281 Muscle weakness (generalized): Secondary | ICD-10-CM

## 2023-08-23 DIAGNOSIS — R29818 Other symptoms and signs involving the nervous system: Secondary | ICD-10-CM

## 2023-08-23 DIAGNOSIS — R278 Other lack of coordination: Secondary | ICD-10-CM

## 2023-08-23 NOTE — Therapy (Signed)
OUTPATIENT OCCUPATIONAL THERAPY TREATMENT and DISCHARGE  Patient Name: Vincent Ferrell MRN: [id number] DOB:[birth year], 62 y.o., male Today's Date: 08/23/2023  PCP: Vincent Care, DO REFERRING PROVIDER: Toy Care, DO   OCCUPATIONAL THERAPY DISCHARGE SUMMARY  Visits from Start of Ferrell: 6  Current functional level related to goals / functional outcomes: See below   Remaining deficits: Same as initial eval - pt limited in hand function and forearm rotation, particularly finger extension and thumb movement for functional movement.   (Pt's proximal movement is better, however still w/ compensatory patterns)   Education / Equipment: HEP's, neuro glove education, A/E recommendations   Patient agrees to discharge. Patient goals were partially met. Patient is being discharged due to maximized rehab potential at this time.      OT End of Session - 08/23/23 1109     Visit Number 6    Number of Visits 9    Date for OT Re-Evaluation 08/26/23    Authorization Type [employer] - 90 OT, PT, ST VL combined    OT Start Time 1108    OT Stop Time 1155    OT Time Calculation (min) 47 min    Activity Tolerance Patient tolerated treatment well    Behavior During Therapy [facility] for tasks assessed/performed            Past Medical History:  Diagnosis Date   Chest pain in adult 10/24/2017   Elevated BP without diagnosis of hypertension 10/24/2017   GERD (gastroesophageal reflux disease)    Hypertension    Impaired fasting glucose 10/24/2017   Mixed hyperlipidemia 10/24/2017   Multifocal pneumonia 12/30/2021   Rising PSA level 10/24/2017   Stroke [facility])    Past Surgical History:  Procedure Laterality Date   COLONOSCOPY WITH PROPOFOL N/A 12/28/2021   Procedure: COLONOSCOPY WITH PROPOFOL;  Surgeon: Vincent Danas, Ferrell;  Location: [facility] ENDOSCOPY;  Service: Gastroenterology;  Laterality: N/A;   COLONOSCOPY WITH PROPOFOL N/A 12/29/2021   Procedure: COLONOSCOPY WITH PROPOFOL;  Surgeon:  Vincent Danas, Ferrell;  Location: [facility] ENDOSCOPY;  Service: Gastroenterology;  Laterality: N/A;   ESOPHAGOGASTRODUODENOSCOPY (EGD) WITH PROPOFOL N/A 12/29/2021   Procedure: ESOPHAGOGASTRODUODENOSCOPY (EGD) WITH PROPOFOL;  Surgeon: Vincent Danas, Ferrell;  Location: [facility] ENDOSCOPY;  Service: Gastroenterology;  Laterality: N/A;   ESOPHAGOGASTRODUODENOSCOPY (EGD) WITH PROPOFOL  01/05/2022   Procedure: ESOPHAGOGASTRODUODENOSCOPY (EGD) WITH PROPOFOL;  Surgeon: Vincent Monks, Ferrell;  [facility]: [facility] ENDOSCOPY;  Service: General;;   IR CT HEAD LTD  12/25/2021   IR PERCUTANEOUS ART THROMBECTOMY/INFUSION INTRACRANIAL INC DIAG ANGIO  12/25/2021   PEG PLACEMENT N/A 01/05/2022   Procedure: PERCUTANEOUS ENDOSCOPIC GASTROSTOMY (PEG) PLACEMENT;  Surgeon: Vincent Monks, Ferrell;  Location: [facility] ENDOSCOPY;  Service: General;  Laterality: N/A;   RADIOLOGY WITH ANESTHESIA N/A 12/25/2021   Procedure: IR WITH ANESTHESIA;  Surgeon: Vincent Ferrell;  Location: [facility] OR;  Service: Radiology;  Laterality: N/A;   TRANSURETHRAL RESECTION OF PROSTATE N/A 12/29/2021   Procedure: TRANSURETHRAL RESECTION OF THE PROSTATE (TURP);  Surgeon: Vincent Elliot, Ferrell;  Location: [facility] OR;  Service: Urology;  Laterality: N/A;   Patient Active Problem List   Diagnosis Date Noted   Spastic hemiplegia affecting nondominant side (HCC) 07/29/2023   Encephalopathy acute 01/07/2022   Severe protein-calorie malnutrition (HCC) 01/07/2022   Physical deconditioning 01/07/2022   Diarrhea 01/07/2022   Pericardial effusion    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   Prostate abscess 12/30/2021   Acute kidney injury (HCC) 12/30/2021   Anemia due to GI blood loss  Acute respiratory failure with hypoxia (HCC)    Acute ischemic right MCA stroke (HCC) 12/25/2021   Sinus tachycardia    Chest pain in adult 10/24/2017   Mixed hyperlipidemia 10/24/2017   Rising PSA level 10/24/2017   Impaired fasting glucose 10/24/2017   Elevated BP without diagnosis of  hypertension 10/24/2017    Rationale for Evaluation and Treatment: Rehabilitation  ONSET DATE: 06/08/2023 (date of referral)  REFERRING DIAG: I69.30 (ICD-10-CM) - Unspecified sequelae of cerebral infarction R53.1 (ICD-10-CM) - Weakness  THERAPY DIAG:  Other symptoms and signs involving the nervous system  Other lack of coordination  Muscle weakness (generalized)  SUBJECTIVE:   SUBJECTIVE STATEMENT: No new falls this week, but my Lt ankle is still a little sore but [place].   Pt accompanied by: self  PERTINENT HISTORY: PMH: CVA 12/25/2021, endocarditis, HTN, HLD  MRI from April, 2023 showed subacute ischemia in the right MCA, right occipital lobe, left frontal and parietal white matter and petechial blood in the left parietal lobe   PRECAUTIONS: None  WEIGHT BEARING RESTRICTIONS No  PAIN:  Are you having pain? Yes: NPRS scale: 0/10 Pain location: L hand Pain description: sore, stiff  FALLS: Has patient fallen in last 6 months? No  LIVING ENVIRONMENT: Lives with: lives alone Lives in: House/apartment Stairs: No Has following equipment at home: None  PLOF: Independent; retired; driving  PATIENT GOALS Improve use of LUE  OBJECTIVE:   HAND DOMINANCE: Right  SENSATION: Occasional paresthesias reported  MUSCLE TONE: LUE: Mild and Hypertonic ; uses Baclofen and heating pad  COGNITION: Overall cognitive status: Within functional limits for tasks assessed  PERCEPTION: Not tested  PRAXIS: Not tested  --------------------------------------------------------------------------------------------------------------------- Vincent Ferrell: IADL Scale  A. Ability to Use Telephone  Operates telephone on own initiative-looks up and dials numbers, etc. - 1  B. Shopping Takes Ferrell of all shopping needs independently - 1  C. Food Preparation Plans, prepares and serves adequate meals independently - 1  D. Housekeeping Performs light daily tasks  such as dish washing, bed making - 1  E. Laundry  Does Engineer, manufacturing - 1  F. Mode of Transportation  Travels independently on public transportation or drives own car - 1  G. Responsibility for Own Medications Is responsible for taking medication in correct dosages at correct time - 1  H. Ability to [employer] financial matters independently (budgets, writes checks, pays rent, bills, goes to [employer]), collects and keeps track of income - 1    TOTAL LAWTON BRODY SCORE: 8/8  -----------------------------------------------------------  Myrtis Ser Index of Independence in ADL INDEPENDENCE (1) DEPENDENCE (0)  NO supervision, direction or personal assistance WITH supervision, direction, personal assistance or total Ferrell   ACTIVITIES 1 or 0  Bathing 1  Dressing 1  Toileting 1  Transferring 1  Continence 1  Feeding 0    KATZ TOTAL: 5/6  -----------------------------------------------------------  FUGL-MEYER ASSESSMENT   A. Upper Extremity  I. Reflex Activity: Flexors (elbow) 2  Extensors (triceps) 2  Total 4/4     II. Volitional Movement within Synergies: Flexor Synergy:   Shoulder Retraction 1  Shoulder Elevation 1  Shoulder Abduction 1  External Rotation 1  Elbow Flexion 1  Forearm Supination 1  Extensor Synergy:   Shoulder Adduction/Internal Rotation 1  Elbow Extension 2  Forearm Pronation 2  Total 11/18   III. Volitional Mixing of Synergies: Hand to Lumbar Spine 1  Shoulder Flexion 1  Pronation/Supination 1  Total 3/6  IV. Volitional Movement with Little to No Synergy Shoulder Abduction 1  Shoulder Flexion 90+ 1  Pronation/Supination 1  Total 3/6    B. Wrist Stability of Wrist at 15* 2  Repeated Flex/Ext 2  Circumduction 1  Stability at 15*Wrist, 0*Elbow 2  Flexion/Extension - Elbow 0* 2  Total 9/10   C. Hand Mass Flexion 1  Mass Extension 1  Total 2/4    D. Grasp Hook  2  Thumb Adduction - paper 0  Pincer - pen 0   Cylindrical - cup 0  Spherical - ball 0  Total 2/10   E. Coordination/Speed Tremor 2  Dysmetria 1  Time 1  Total 4/6    FUGL-MEYER ASSESSMENT: UPPER EXTREMITY A-E TOTAL: 38/66   07/06/23:   Grip strength Lt = 6-8 lbs (placing fingers around dynamometer)  Box & Blocks Lt = unable w/o assist from Rt hand to help open hand and place blocks in hand 9 hole peg Lt = unable  ---------------------------------------------------------------------------------------------------------------------  TODAY'S TREATMENT:                                                                                                                               Pt returns today with neuro glove for training purposes. Pt with max difficulty and min assist to don neuro glove correctly and min assist w/ donning mirror glove on unaffected side. Pt may with practice be able to don I'ly however does report he has a neighbor that can assist if needed.   Therapist instructed in proper donning prior to use.  Practiced use in Mode A (automatic) just involved Lt hand, and Mode B with mirroring of uninvolved Rt side for gross finger flex/ext of Lt hand. Recommended beginning 2 sessions per day for 15 minute intervals, then gradually increasing up to 4x/day for 15 min intervals. However, pt will be most limited by I'ly donning/doffing and may return unit if he is unable to do or get neighbor to assist  Assessed remaining goals for d/c - see below Also discussed returning to O.T. in approx 3 months after 2nd botox injections in hopes of greater PIP extension of digits 2-4 and greater abduction of thumb.    PATIENT EDUCATION: Education details: neuro glove proper use including: donning/doffing, wearing schedule, and operation Person educated: Patient Education method: Explanation, demo Education comprehension: verbalized understanding, return demo  HOME EXERCISE PROGRAM: Review of splint wear and Ferrell 08/16/23: shoulder  HEP, A/E recommendations  GOALS:  SHORT TERM GOALS: Target date: 07/17/2023   Patient will demonstrate independence with initial LUE HEP. Baseline: Goal status: MET  2.  Pt to achieve at least 10 lbs grip strength Lt hand Baseline: 6-8 lbs Goal status: DEFERRED - focus is more on extension  3. Pt to be independent with resting hand splint to position him in IP extension and thumb palmer abduction, and taco splint to keep IP's straight while working on MP flex/ext   Goal status:  MET (taco splint not recommended right now)    LONG TERM GOALS: Target date: 08/26/2023  Patient will demonstrate updated LUE HEP with 25% verbal cues or less for proper execution. Baseline:  Goal status: MET  2.  Pt to improve LUE function as evidenced by performing 5 or more blocks on Box & Blocks test Baseline: unable w/o assist from Rt hand Goal status: NOT MET  3.  Pt to demonstrate improved L thumb abduction AROM as needed to position business card between 1st and 2nd digits.  Baseline: unable to complete Goal status: NOT MET  4.  Pt to demonstrate at least 50% AROM L composite extension at IP's as needed to grasp items in hand. Baseline: <25% Goal status: NOT MET    ASSESSMENT:  CLINICAL IMPRESSION: Patient returns today for neuro glove training - see above. Discussed d/c and returning in approx 3 months after 2nd botox injections for Lt hand  PERFORMANCE DEFICITS in functional skills including ADLs, IADLs, coordination, dexterity, edema, tone, ROM, strength, Fine motor control, and UE functional use.   IMPAIRMENTS are limiting patient from ADLs, IADLs, and leisure.   COMORBIDITIES may have co-morbidities  that affects occupational performance. Patient will benefit from skilled OT to address above impairments and improve overall function.  MODIFICATION OR ASSISTANCE TO COMPLETE EVALUATION: Min-Moderate modification of tasks or assist with assess necessary to complete an evaluation.  OT  OCCUPATIONAL PROFILE AND HISTORY: Problem focused assessment: Including review of records relating to presenting problem.  CLINICAL DECISION MAKING: LOW - limited treatment options, no task modification necessary  REHAB POTENTIAL: Good  EVALUATION COMPLEXITY: Low  PLAN:  Will continue to monitor appropriateness of candidacy for Vivistim and reevaluate and develop plan upon return if determined appropriate.  Vivistim Checklist Stroke Chronicity: >6 months post ischemic stroke  Affected Side: left upper extremity  Hand Dominance: right handed prior to CVA; currently right handed  Fugl-Meyer Score (UE): 38/66 (LEAPS Version) indicating moderate to severe impairment  ADL/IADL Status: Pt demonstrates functional deficits with L digit extension, thumb abduction, supination, and spasticity that negatively affects occupational performance and overall level of independence.   Currently, this pt is requiring increased time and compensation with use of unaffected RUE  for activity completion (see Objective section for further details).  Spasticity: Spasticity is not managed well enough to recommend Vivistim at this time. Recommend pt Inquire about Botox and Continue with HEP and re-consult if improvement is achieved.   General Therapy History: Pt has been seen for LUE intervention in the inpatient, SNF, and OP setting(s) to address functional deficits following CVA.   Rehab Potential:  The patient is motivated to perform intensive outpatient occupational therapy.     OT FREQUENCY: 1x/week  OT DURATION: 8 weeks  PLANNED INTERVENTIONS: self Ferrell/ADL training, therapeutic exercise, therapeutic activity, neuromuscular re-education, manual therapy, passive range of motion, splinting, electrical stimulation, ultrasound, moist heat, patient/family education, and Re-evaluation  RECOMMENDED OTHER SERVICES: Consult with PM&R for possible Botox  CONSULTED AND AGREED WITH PLAN OF Ferrell: Patient  PLAN D/C  O.T. - Recommend returning  in approx 3 months after botox injections   Sheran Lawless, OT 08/23/2023, 1:15 PM

## 2023-08-30 ENCOUNTER — Ambulatory Visit: Payer: Managed Care, Other (non HMO) | Admitting: Physical Medicine & Rehabilitation

## 2023-09-09 ENCOUNTER — Encounter
Payer: Managed Care, Other (non HMO) | Attending: Physical Medicine & Rehabilitation | Admitting: Physical Medicine & Rehabilitation

## 2023-09-09 ENCOUNTER — Encounter: Payer: Self-pay | Admitting: Physical Medicine & Rehabilitation

## 2023-09-09 VITALS — BP 109/69 | HR 54 | Ht 74.0 in | Wt 203.0 lb

## 2023-09-09 DIAGNOSIS — G8110 Spastic hemiplegia affecting unspecified side: Secondary | ICD-10-CM | POA: Insufficient documentation

## 2023-09-09 DIAGNOSIS — G8114 Spastic hemiplegia affecting left nondominant side: Secondary | ICD-10-CM | POA: Diagnosis not present

## 2023-09-09 NOTE — Progress Notes (Signed)
Subjective:    Patient ID: Vincent Ferrell, male    DOB: Aug 13, 1961, 62 y.o.   MRN: [id number]  gentleman with a history of HTN, HLD, GERD who presented with stroke symptoms, found to have MSSA bacteremia and endocarditis causing embolic strokes and prostate abscess.   4/14 Admitted with acute R MCA stroke s/p mechanical thrombectomy with revascularization.  Cards consulted for initial code STEMI. Required 4 units of pRBCs for lower GI bleed.  4/17: Large bloody BM. Bedside colonoscopy with blood throughout colon. TEE with 1 cm x 0.5 cm vegetation involving both atrial and ventricular sides. Hemoglobin decreased to 6.8. Additional 2 units of pRBCs and FFP given.  4/18: Bedside EGD and colonoscopy with evidence of black blood but no source of bleeding identified. MRI of the prostate demonstrated large abscess. Patient taken for drainage by Urology. Cultures obtained.  4/20: TCTS consulted for concern of valvular insufficiency. Repeat echo with increased peri-cardial effusion size.   4/23 MRI brian with expected stroke evolution. 4/24 trach 4/25 PEG, repeat Echocardiogram shows improved pericardial effusion. HPI  Pt feels botox was helpful with extension of fingers, his OT told him she thought he may benefit from a higher dose .   No post injection soreness Botox injection 07/29/23 Left  FPL 25 FDS 25 FDP 25 PT 25 Pain Inventory Average Pain 1 Pain Right Now 0 My pain is  unsure  In the last 24 hours, has pain interfered with the following? General activity 0 Relation with others 0 Enjoyment of life 0  Sleep (in general) Fair    Family History  Problem Relation Age of Onset   Stroke Neg Hx    Social History   Socioeconomic History   Marital status: Widowed    Spouse name: Not on file   Number of children: Not on file   Years of education: Not on file   Highest education level: Not on file  Occupational History   Not on file  Tobacco Use   Smoking status: Never     Passive exposure: Never   Smokeless tobacco: Never  Vaping Use   Vaping status: Never Used  Substance and Sexual Activity   Alcohol use: Yes    Comment: occ   Drug use: Never   Sexual activity: Not on file  Other Topics Concern   Not on file  Social History Narrative   Not on file   Social Drivers of Health   Financial Resource Strain: Low Risk  (11/24/2020)   Received from [facility] visits prior to 11/13/2022., [facility] visits prior to 11/13/2022.   Overall Financial Resource Strain (CARDIA)    Difficulty of Paying Living Expenses: Not hard at all  Food Insecurity: Low Risk  (05/09/2023)   [facility] from [facility]   Hunger Vital Sign    Worried About Running Out of Food in the Last Year: Never true    Ran Out of Food in the Last Year: Never true  Transportation Needs: No Transportation Needs (05/09/2023)   Received from [employer]    In the past 12 months, has lack of reliable transportation kept you from medical appointments, meetings, work or from getting things needed for daily living? : No  Physical Activity: Insufficiently Active (11/24/2020)   Received from [facility] visits prior to 11/13/2022., [facility] visits prior to 11/13/2022.   Exercise Vital Sign    Days of Exercise per  Week: 1 day    Minutes of Exercise per Session: 20 min  Stress: No Stress Concern Present (11/24/2020)   [facility] visits prior to 11/13/2022., [facility] visits prior to 11/13/2022.   [employer] of Occupational Health - Occupational Stress Questionnaire    Feeling of Stress : Only a little  Social Connections: Unknown (02/19/2022)   Received from [facility]   Social Network    Social Network: Not on file   Past Surgical History:  Procedure Laterality Date   COLONOSCOPY WITH PROPOFOL N/A 12/28/2021    Procedure: COLONOSCOPY WITH PROPOFOL;  Surgeon: Tressia Danas, MD;  Location: [facility] ENDOSCOPY;  Service: Gastroenterology;  Laterality: N/A;   COLONOSCOPY WITH PROPOFOL N/A 12/29/2021   Procedure: COLONOSCOPY WITH PROPOFOL;  Surgeon: Tressia Danas, MD;  Location: [facility] ENDOSCOPY;  Service: Gastroenterology;  Laterality: N/A;   ESOPHAGOGASTRODUODENOSCOPY (EGD) WITH PROPOFOL N/A 12/29/2021   Procedure: ESOPHAGOGASTRODUODENOSCOPY (EGD) WITH PROPOFOL;  Surgeon: Tressia Danas, MD;  Location: [facility] ENDOSCOPY;  Service: Gastroenterology;  Laterality: N/A;   ESOPHAGOGASTRODUODENOSCOPY (EGD) WITH PROPOFOL  01/05/2022   Procedure: ESOPHAGOGASTRODUODENOSCOPY (EGD) WITH PROPOFOL;  Surgeon: Diamantina Monks, MD;  Location: [facility] ENDOSCOPY;  Service: General;;   IR CT HEAD LTD  12/25/2021   IR PERCUTANEOUS ART THROMBECTOMY/INFUSION INTRACRANIAL INC DIAG ANGIO  12/25/2021   PEG PLACEMENT N/A 01/05/2022   Procedure: PERCUTANEOUS ENDOSCOPIC GASTROSTOMY (PEG) PLACEMENT;  Surgeon: Diamantina Monks, MD;  Location: [facility] ENDOSCOPY;  Service: General;  Laterality: N/A;   RADIOLOGY WITH ANESTHESIA N/A 12/25/2021   Procedure: IR WITH ANESTHESIA;  Surgeon: Radiologist, Medication, MD;  Location: [facility] OR;  Service: Radiology;  Laterality: N/A;   TRANSURETHRAL RESECTION OF PROSTATE N/A 12/29/2021   Procedure: TRANSURETHRAL RESECTION OF THE PROSTATE (TURP);  Surgeon: Crista Elliot, MD;  Location: [facility] OR;  Service: Urology;  Laterality: N/A;   Past Surgical History:  Procedure Laterality Date   COLONOSCOPY WITH PROPOFOL N/A 12/28/2021   Procedure: COLONOSCOPY WITH PROPOFOL;  Surgeon: Tressia Danas, MD;  Location[facility] ENDOSCOPY;  Service: Gastroenterology;  Laterality: N/A;   COLONOSCOPY WITH PROPOFOL N/A 12/29/2021   Procedure: COLONOSCOPY WITH PROPOFOL;  Surgeon: Tressia Danas, MD;  Location: [facility] ENDOSCOPY;  Service: Gastroenterology;  Laterality: N/A;   ESOPHAGOGASTRODUODENOSCOPY (EGD) WITH PROPOFOL N/A 12/29/2021    Procedure: ESOPHAGOGASTRODUODENOSCOPY (EGD) WITH PROPOFOL;  Surgeon: Tressia Danas, MD;  Location: [facility] ENDOSCOPY;  Service: Gastroenterology;  Laterality: N/A;   ESOPHAGOGASTRODUODENOSCOPY (EGD) WITH PROPOFOL  01/05/2022   Procedure: ESOPHAGOGASTRODUODENOSCOPY (EGD) WITH PROPOFOL;  Surgeon: Diamantina Monks, MD;  [facility] ENDOSCOPY;  Service: General;;   IR CT HEAD LTD  12/25/2021   IR PERCUTANEOUS ART THROMBECTOMY/INFUSION INTRACRANIAL INC DIAG ANGIO  12/25/2021   PEG PLACEMENT N/A 01/05/2022   Procedure: PERCUTANEOUS ENDOSCOPIC GASTROSTOMY (PEG) PLACEMENT;  Surgeon: Diamantina Monks, MD;  Location: [facility] ENDOSCOPY;  Service: General;  Laterality: N/A;   RADIOLOGY WITH ANESTHESIA N/A 12/25/2021   Procedure: IR WITH ANESTHESIA;  Surgeon: Radiologist, Medication, MD;  Location: [facility] OR;  Service: Radiology;  Laterality: N/A;   TRANSURETHRAL RESECTION OF PROSTATE N/A 12/29/2021   Procedure: TRANSURETHRAL RESECTION OF THE PROSTATE (TURP);  Surgeon: Crista Elliot, MD;  Location: [facility] OR;  Service: Urology;  Laterality: N/A;   Past Medical History:  Diagnosis Date   Chest pain in adult 10/24/2017   Elevated BP without diagnosis of hypertension 10/24/2017   GERD (gastroesophageal reflux disease)    Hypertension    Impaired fasting glucose  10/24/2017   Mixed hyperlipidemia 10/24/2017   Multifocal pneumonia 12/30/2021   Rising PSA level 10/24/2017   Stroke (HCC)    BP 109/69   Pulse (!) 54   Ht 6\' 2"  (1.88 m)   Wt 203 lb (92.1 kg)   SpO2 96%   BMI 26.06 kg/m   Opioid Risk Score:   Fall Risk Score:  `1  Depression screen [facility] 2/9     09/09/2023    2:32 PM  Depression screen PHQ 2/9  Decreased Interest 0  Down, Depressed, Hopeless 0  PHQ - 2 Score 0      Review of Systems  Musculoskeletal:        Left arm pain   All other systems reviewed and are negative.     Objective:   Physical Exam  General No acute distress Mood and affect appropriate Speech with mild  dysarthria Motor strength is 3 - at the left deltoid bicep tricep to minus at the finger flexors and extensors trace wrist flexion extension.  There is 3 - forearm supination. Left lower extremity 4+ in the hip flexor knee extensor ankle dorsiflexor plantar flexor Right upper limb and right lower limb with normal strength Answers questions appropriately follows complex commands without difficulty     Assessment & Plan:   1.  Left upper extremity right lower extremity weakness post CVA approximately 18 months ago he has made a very good recovery given the severity of his initial deficits.  He continues to have significant weakness in the distal left hand musculature as well as increased tone.  In terms of his tone we may be able to increase his botulinum toxin injection as follows.  Total dose will increase from 100 units to 200 units will repeat in 6 weeks.  Resume OT 1-2wks after that  Left  FPL 25 FDS 50 FDP 50 PT 50 FCR 25

## 2023-10-21 ENCOUNTER — Encounter
Payer: Managed Care, Other (non HMO) | Attending: Physical Medicine & Rehabilitation | Admitting: Physical Medicine & Rehabilitation

## 2023-10-21 ENCOUNTER — Encounter: Payer: Self-pay | Admitting: Physical Medicine & Rehabilitation

## 2023-10-21 VITALS — BP 118/77 | HR 52 | Ht 74.0 in | Wt 210.0 lb

## 2023-10-21 DIAGNOSIS — G8114 Spastic hemiplegia affecting left nondominant side: Secondary | ICD-10-CM | POA: Diagnosis not present

## 2023-10-21 DIAGNOSIS — G8110 Spastic hemiplegia affecting unspecified side: Secondary | ICD-10-CM | POA: Insufficient documentation

## 2023-10-21 MED ORDER — ONABOTULINUMTOXINA 100 UNITS IJ SOLR
200.0000 [IU] | Freq: Once | INTRAMUSCULAR | Status: AC
  Administered 2023-10-21: 200 [IU] via INTRAMUSCULAR

## 2023-10-21 NOTE — Procedures (Signed)
 Botox  Injection for spasticity using needle EMG guidance  Dilution: 50 Units/ml Indication: Severe spasticity which interferes with ADL,mobility and/or  hygiene and is unresponsive to medication management and other conservative care Informed consent was obtained after describing risks and benefits of the procedure with the patient. This includes bleeding, bruising, infection, excessive weakness, or medication side effects. A REMS form is on file and signed. Needle: 27g 1" needle electrode Number of units per muscle Left   FDS 50 FDP 50 PT 50 FCR 50 All injections were done after obtaining appropriate EMG activity and after negative drawback for blood. The patient tolerated the procedure well. Post procedure instructions were given. A followup appointment was made.

## 2023-10-21 NOTE — Progress Notes (Signed)
 See procedure note.

## 2023-11-03 ENCOUNTER — Ambulatory Visit: Payer: Managed Care, Other (non HMO) | Attending: Family Medicine | Admitting: Occupational Therapy

## 2023-11-03 ENCOUNTER — Encounter: Payer: Self-pay | Admitting: Occupational Therapy

## 2023-11-03 DIAGNOSIS — M6281 Muscle weakness (generalized): Secondary | ICD-10-CM | POA: Diagnosis present

## 2023-11-03 DIAGNOSIS — G8110 Spastic hemiplegia affecting unspecified side: Secondary | ICD-10-CM | POA: Insufficient documentation

## 2023-11-03 DIAGNOSIS — R29818 Other symptoms and signs involving the nervous system: Secondary | ICD-10-CM | POA: Insufficient documentation

## 2023-11-03 DIAGNOSIS — R278 Other lack of coordination: Secondary | ICD-10-CM | POA: Diagnosis present

## 2023-11-03 DIAGNOSIS — R208 Other disturbances of skin sensation: Secondary | ICD-10-CM | POA: Diagnosis present

## 2023-11-03 NOTE — Therapy (Signed)
OUTPATIENT OCCUPATIONAL THERAPY NEURO EVALUATION  Patient Name: Vincent Ferrell MRN: [id number] DOB:09-19-1960, 63 y.o., male Today's Date: 11/03/2023  PCP: Toy Care, DO REFERRING PROVIDER: Erick Colace, MD   OT End of Session - 11/03/23 1256     Visit Number 1    Number of Visits 7    Date for OT Re-Evaluation 12/16/23    Authorization Type [employer] - 90 OT, PT, ST VL combined    OT Start Time 0935    OT Stop Time 1018    OT Time Calculation (min) 43 min    Activity Tolerance Patient tolerated treatment well    Behavior During Therapy [facility] for tasks assessed/performed             Past Medical History:  Diagnosis Date   Chest pain in adult 10/24/2017   Elevated BP without diagnosis of hypertension 10/24/2017   GERD (gastroesophageal reflux disease)    Hypertension    Impaired fasting glucose 10/24/2017   Mixed hyperlipidemia 10/24/2017   Multifocal pneumonia 12/30/2021   Rising PSA level 10/24/2017   Stroke [facility])    Past Surgical History:  Procedure Laterality Date   COLONOSCOPY WITH PROPOFOL N/A 12/28/2021   Procedure: COLONOSCOPY WITH PROPOFOL;  Surgeon: Tressia Danas, MD;  Location: [facility] ENDOSCOPY;  Service: Gastroenterology;  Laterality: N/A;   COLONOSCOPY WITH PROPOFOL N/A 12/29/2021   Procedure: COLONOSCOPY WITH PROPOFOL;  Surgeon: Tressia Danas, MD;  Location: [facility] ENDOSCOPY;  Service: Gastroenterology;  Laterality: N/A;   ESOPHAGOGASTRODUODENOSCOPY (EGD) WITH PROPOFOL N/A 12/29/2021   Procedure: ESOPHAGOGASTRODUODENOSCOPY (EGD) WITH PROPOFOL;  Surgeon: Tressia Danas, MD;  Location: [facility] ENDOSCOPY;  Service: Gastroenterology;  Laterality: N/A;   ESOPHAGOGASTRODUODENOSCOPY (EGD) WITH PROPOFOL  01/05/2022   Procedure: ESOPHAGOGASTRODUODENOSCOPY (EGD) WITH PROPOFOL;  Surgeon: Diamantina Monks, MD;  Location: [facility] ENDOSCOPY;  Service: General;;   IR CT HEAD LTD  12/25/2021   IR PERCUTANEOUS ART THROMBECTOMY/INFUSION INTRACRANIAL INC DIAG ANGIO  12/25/2021    PEG PLACEMENT N/A 01/05/2022   Procedure: PERCUTANEOUS ENDOSCOPIC GASTROSTOMY (PEG) PLACEMENT;  Surgeon: Diamantina Monks, MD;  Location: [facility] ENDOSCOPY;  Service: General;  Laterality: N/A;   RADIOLOGY WITH ANESTHESIA N/A 12/25/2021   Procedure: IR WITH ANESTHESIA;  Surgeon: Radiologist, Medication, MD;  Location: [facility] OR;  Service: Radiology;  Laterality: N/A;   TRANSURETHRAL RESECTION OF PROSTATE N/A 12/29/2021   Procedure: TRANSURETHRAL RESECTION OF THE PROSTATE (TURP);  Surgeon: Crista Elliot, MD;  Location: [facility] OR;  Service: Urology;  Laterality: N/A;   Patient Active Problem List   Diagnosis Date Noted   Spastic hemiplegia affecting nondominant side (HCC) 07/29/2023   Encephalopathy acute 01/07/2022   Severe protein-calorie malnutrition (HCC) 01/07/2022   Physical deconditioning 01/07/2022   Diarrhea 01/07/2022   Pericardial effusion    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   Prostate abscess 12/30/2021   Acute kidney injury (HCC) 12/30/2021   Anemia due to GI blood loss    Acute respiratory failure with hypoxia (HCC)    Acute ischemic right MCA stroke (HCC) 12/25/2021   Sinus tachycardia    Chest pain in adult 10/24/2017   Mixed hyperlipidemia 10/24/2017   Rising PSA level 10/24/2017   Impaired fasting glucose 10/24/2017   Elevated BP without diagnosis of hypertension 10/24/2017    Rationale for Evaluation and Treatment: Rehabilitation  ONSET DATE: 10/21/2023 (date of referral)  REFERRING DIAG: G81.10 (ICD-10-CM) - Spastic hemiplegia affecting nondominant side   THERAPY DIAG:  Other symptoms and signs involving the nervous system  Other lack of coordination  Muscle weakness (generalized)  Other disturbances of skin sensation  SUBJECTIVE:   SUBJECTIVE STATEMENT: Pt reports Botox injections two weeks ago. He has noticed some improvement to ROM.   Pt accompanied by: self  PERTINENT HISTORY: PMH: endocarditis, HTN, HLD  MRI from April, 2024 showed subacute  ischemia in the right MCA, right occipital lobe, left frontal and parietal white matter and petechial blood in the left parietal lobe   PRECAUTIONS: None  WEIGHT BEARING RESTRICTIONS No  PAIN:  Are you having pain? Yes: NPRS scale: 1/10 Pain location: L hand Pain description: sore, stiff  FALLS: Has patient fallen in last 6 months? Yes. 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 GOALS Improve use of LUE  OBJECTIVE:   HAND DOMINANCE: Right  FUNCTIONAL OUTCOME MEASURES: Quick Dash: 18.2 % disability with use of LUE   UPPER EXTREMITY ROM:     AROM Right (eval) Left (eval)  Shoulder flexion WNL 90  Shoulder abduction WNL 70  Elbow flexion WNL 120  Elbow extension WNL WNL  Wrist flexion WNL 35  Wrist extension WNL 70  Wrist pronation WNL WNL  Wrist supination WNL 10   Digit Composite Flexion WNL Lacks 8 cm thumb, 2 cm ring finger, and 3 cm pinky  Digit Composite Extension WNL Trace movements  Digit Opposition WNL To index only  (Blank rows = not tested)  HAND FUNCTION: Grip strength: Right: 65 lbs; Left: unable to register (manual)  COORDINATION: Box and Blocks:   Left 2 blocks  SENSATION: Occasional paresthesias reported  MUSCLE TONE: LUE: Mild and Hypertonic ; uses Baclofen and heating pad  COGNITION: Overall cognitive status: Within functional limits for tasks assessed  PERCEPTION: Not tested  PRAXIS: Not tested  TODAY'S TREATMENT:                                                                                                                               OT applied KinesioTape to L hand to promote wrist flexion and digit extension. Education provided on tape wear and care as well as functional grasp and release activities.   PATIENT  EDUCATION: Education details: OT POC; taping with functional use Person educated: Patient Education method: Explanation, demonstrated Education comprehension: verbalized understanding, returned demonstration  HOME EXERCISE PROGRAM: N/A this date  GOALS:  SHORT TERM GOALS: Target date: 12/01/2023  Patient will demonstrate independence with initial LUE HEP. Baseline: Goal status: INITIAL  2.  OT to assess L grip strength and set appropriate goal. Baseline: unable to register force Goal status: INITIAL   LONG TERM GOALS: Target date: 12/16/2023  Patient will demonstrate updated LUE HEP with 25% verbal cues or less for proper execution. Baseline:  Goal status: INITIAL  2.  Pt will be able to place at least  7 blocks using left hand with completion of Box and Blocks test. Baseline: 2 blocks Goal status: INITIAL  3.  Pt to demonstrate improved L thumb abduction AROM as needed to position business card between 1st and 2nd digits.  Baseline: unable to complete Goal status: INITIAL  4.  Pt to demonstrate at least 50% L composite extension AROM as needed to grasp items in hand. Baseline: <25% Goal status: INITIAL   ASSESSMENT:  CLINICAL IMPRESSION: Patient is a 63 y.o. male who was seen today for occupational therapy evaluation for LUE Spasticity. Hx includes endocarditis, HTN, and HLD. Patient currently presents below baseline level of functioning demonstrating functional deficits and impairments as noted below. Pt would benefit from skilled OT services in the outpatient setting to work on impairments as noted below to help pt return to PLOF as able.    PERFORMANCE DEFICITS in functional skills including ADLs, IADLs, coordination, dexterity, edema, tone, ROM, strength, Fine motor control, and UE functional use.   IMPAIRMENTS are limiting patient from ADLs, IADLs, and leisure.   COMORBIDITIES may have co-morbidities  that affects occupational performance. Patient will benefit from  skilled OT to address above impairments and improve overall function.  MODIFICATION OR ASSISTANCE TO COMPLETE EVALUATION: Min-Moderate modification of tasks or assist with assess necessary to complete an evaluation.  OT OCCUPATIONAL PROFILE AND HISTORY: Detailed assessment: Review of records and additional review of physical, cognitive, psychosocial history related to current functional performance.  CLINICAL DECISION MAKING: Moderate - several treatment options, min-mod task modification necessary  REHAB POTENTIAL: Good  EVALUATION COMPLEXITY: Moderate  PLAN:  OT FREQUENCY: 1x/week  OT DURATION: 6 weeks  PLANNED INTERVENTIONS: self care/ADL training, therapeutic exercise, therapeutic activity, neuromuscular re-education, manual therapy, passive range of motion, splinting, electrical stimulation, ultrasound, moist heat, patient/family education, and Re-evaluation  RECOMMENDED OTHER SERVICES: N/A for this visit  CONSULTED AND AGREED WITH PLAN OF CARE: Patient  PLAN FOR NEXT SESSION: How did taping go?; review use of TENS unit from home; review HEP  Delana Meyer, OT 11/03/2023, 12:59 PM

## 2023-11-10 ENCOUNTER — Ambulatory Visit: Payer: Managed Care, Other (non HMO) | Admitting: Occupational Therapy

## 2023-11-10 DIAGNOSIS — R29818 Other symptoms and signs involving the nervous system: Secondary | ICD-10-CM | POA: Diagnosis not present

## 2023-11-10 DIAGNOSIS — M6281 Muscle weakness (generalized): Secondary | ICD-10-CM

## 2023-11-10 DIAGNOSIS — R208 Other disturbances of skin sensation: Secondary | ICD-10-CM

## 2023-11-10 DIAGNOSIS — R278 Other lack of coordination: Secondary | ICD-10-CM

## 2023-11-10 NOTE — Therapy (Signed)
 OUTPATIENT OCCUPATIONAL THERAPY NEURO EVALUATION  Patient Name: Vincent Ferrell MRN: [id number] DOB:02-03-1961, 63 y.o., male Today's Date: 11/10/2023  PCP: Toy Care, DO REFERRING PROVIDER: Toy Care, DO   OT End of Session - 11/10/23 1327     Visit Number 2    Number of Visits 7    Date for OT Re-Evaluation 12/16/23    Authorization Type [employer] - 90 OT, PT, ST VL combined    OT Start Time 0936    OT Stop Time 1015    OT Time Calculation (min) 39 min    Activity Tolerance Patient tolerated treatment well    Behavior During Therapy [facility] for tasks assessed/performed             Past Medical History:  Diagnosis Date   Chest pain in adult 10/24/2017   Elevated BP without diagnosis of hypertension 10/24/2017   GERD (gastroesophageal reflux disease)    Hypertension    Impaired fasting glucose 10/24/2017   Mixed hyperlipidemia 10/24/2017   Multifocal pneumonia 12/30/2021   Rising PSA level 10/24/2017   Stroke [facility])    Past Surgical History:  Procedure Laterality Date   COLONOSCOPY WITH PROPOFOL N/A 12/28/2021   Procedure: COLONOSCOPY WITH PROPOFOL;  Surgeon: Tressia Danas, MD;  Location: [facility] ENDOSCOPY;  Service: Gastroenterology;  Laterality: N/A;   COLONOSCOPY WITH PROPOFOL N/A 12/29/2021   Procedure: COLONOSCOPY WITH PROPOFOL;  Surgeon: Tressia Danas, MD;  Location: [facility] ENDOSCOPY;  Service: Gastroenterology;  Laterality: N/A;   ESOPHAGOGASTRODUODENOSCOPY (EGD) WITH PROPOFOL N/A 12/29/2021   Procedure: ESOPHAGOGASTRODUODENOSCOPY (EGD) WITH PROPOFOL;  Surgeon: Tressia Danas, MD;  Location: [facility] ENDOSCOPY;  Service: Gastroenterology;  Laterality: N/A;   ESOPHAGOGASTRODUODENOSCOPY (EGD) WITH PROPOFOL  01/05/2022   Procedure: ESOPHAGOGASTRODUODENOSCOPY (EGD) WITH PROPOFOL;  Surgeon: Diamantina Monks, MD;  Location: [facility] ENDOSCOPY;  Service: General;;   IR CT HEAD LTD  12/25/2021   IR PERCUTANEOUS ART THROMBECTOMY/INFUSION INTRACRANIAL INC DIAG ANGIO  12/25/2021    PEG PLACEMENT N/A 01/05/2022   Procedure: PERCUTANEOUS ENDOSCOPIC GASTROSTOMY (PEG) PLACEMENT;  Surgeon: Diamantina Monks, MD;  Location: [facility] ENDOSCOPY;  Service: General;  Laterality: N/A;   RADIOLOGY WITH ANESTHESIA N/A 12/25/2021   Procedure: IR WITH ANESTHESIA;  Surgeon: Radiologist, Medication, MD;  Location: [facility] OR;  Service: Radiology;  Laterality: N/A;   TRANSURETHRAL RESECTION OF PROSTATE N/A 12/29/2021   Procedure: TRANSURETHRAL RESECTION OF THE PROSTATE (TURP);  Surgeon: Crista Elliot, MD;  Location: [facility] OR;  Service: Urology;  Laterality: N/A;   Patient Active Problem List   Diagnosis Date Noted   Spastic hemiplegia affecting nondominant side (HCC) 07/29/2023   Encephalopathy acute 01/07/2022   Severe protein-calorie malnutrition (HCC) 01/07/2022   Physical deconditioning 01/07/2022   Diarrhea 01/07/2022   Pericardial effusion    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   Prostate abscess 12/30/2021   Acute kidney injury (HCC) 12/30/2021   Anemia due to GI blood loss    Acute respiratory failure with hypoxia (HCC)    Acute ischemic right MCA stroke (HCC) 12/25/2021   Sinus tachycardia    Chest pain in adult 10/24/2017   Mixed hyperlipidemia 10/24/2017   Rising PSA level 10/24/2017   Impaired fasting glucose 10/24/2017   Elevated BP without diagnosis of hypertension 10/24/2017    Rationale for Evaluation and Treatment: Rehabilitation  ONSET DATE: 10/21/2023 (date of referral)  REFERRING DIAG: G81.10 (ICD-10-CM) - Spastic hemiplegia affecting nondominant side   THERAPY DIAG:  Other symptoms and signs involving the nervous system  Other lack of coordination  Muscle weakness (generalized)  Other disturbances of skin sensation  SUBJECTIVE:   SUBJECTIVE STATEMENT: Pt reports he was able to keep the tape on until Sunday.   Pt accompanied by: self  PERTINENT HISTORY: PMH: endocarditis, HTN, HLD  MRI from April, 2024 showed subacute ischemia in the right MCA,  right occipital lobe, left frontal and parietal white matter and petechial blood in the left parietal lobe   PRECAUTIONS: None  WEIGHT BEARING RESTRICTIONS No  PAIN:  Are you having pain? Yes: NPRS scale: 1/10 Pain location: L hand Pain description: sore, stiff  FALLS: Has patient fallen in last 6 months? Yes. 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 GOALS Improve use of LUE  OBJECTIVE:   HAND DOMINANCE: Right  FUNCTIONAL OUTCOME MEASURES: Quick Dash: 18.2 % disability with use of LUE   UPPER EXTREMITY ROM:     AROM Right (eval) Left (eval)  Shoulder flexion WNL 90  Shoulder abduction WNL 70  Elbow flexion WNL 120  Elbow extension WNL WNL  Wrist flexion WNL 35  Wrist extension WNL 70  Wrist pronation WNL WNL  Wrist supination WNL 10   Digit Composite Flexion WNL Lacks 8 cm thumb, 2 cm ring finger, and 3 cm pinky  Digit Composite Extension WNL Trace movements  Digit Opposition WNL To index only  (Blank rows = not tested)  HAND FUNCTION: Grip strength: Right: 65 lbs; Left: unable to register (manual)  COORDINATION: Box and Blocks:   Left 2 blocks  SENSATION: Occasional paresthesias reported  MUSCLE TONE: LUE: Mild and Hypertonic ; uses Baclofen and heating pad  COGNITION: Overall cognitive status: Within functional limits for tasks assessed  PERCEPTION: Not tested  PRAXIS: Not tested  TODAY'S TREATMENT:                                                                                                                               OT educated patient on use of NMES unit as noted in patient instructions. Therapist educated patient on application for L wrist and digit extension as well as L thumb opposition. Patient able to teach back information  provided and tolerated supervised application to promote improved AROM and pain management. Therapist assisting patient with marking of electrode placement on arm to help with carryover outside of therapy.  Modalities Skin Integrity Assessment:  NMES utilized: No redness, irritation or skin integrity concerns were noted before, during and/or after use. Patient was informed of risks and benefits to treatment today. Skin integrity prior to treatment: Intact. Skin integrity after treatment: Intact.   OT educated pt on general NMES unit management including list of parameters, electrode use and maintenance, and hair maintenance over forearms.   PATIENT EDUCATION: Education details: [employer] Use Person educated: Patient Education method: Explanation,  demonstrated, handouts Education comprehension: verbalized understanding, returned demonstration, requires further review/instruction  HOME EXERCISE PROGRAM: 11/10/2023: NMES placement and parameter tables   GOALS:  SHORT TERM GOALS: Target date: 12/01/2023  Patient will demonstrate independence with initial LUE HEP. Baseline: Goal status: INITIAL  2.  OT to assess L grip strength and set appropriate goal. Baseline: unable to register force Goal status: INITIAL   LONG TERM GOALS: Target date: 12/16/2023  Patient will demonstrate updated LUE HEP with 25% verbal cues or less for proper execution. Baseline:  Goal status: INITIAL  2.  Pt will be able to place at least 7 blocks using left hand with completion of Box and Blocks test. Baseline: 2 blocks Goal status: INITIAL  3.  Pt to demonstrate improved L thumb abduction AROM as needed to position business card between 1st and 2nd digits.  Baseline: unable to complete Goal status: INITIAL  4.  Pt to demonstrate at least 50% L composite extension AROM as needed to grasp items in hand. Baseline: <25% Goal status: INITIAL  ASSESSMENT:  CLINICAL IMPRESSION: Patient demonstrates improved  understanding of NMES unit use as needed to progress towards goals.   PERFORMANCE DEFICITS in functional skills including ADLs, IADLs, coordination, dexterity, edema, tone, ROM, strength, Fine motor control, and UE functional use.   IMPAIRMENTS are limiting patient from ADLs, IADLs, and leisure.   COMORBIDITIES may have co-morbidities  that affects occupational performance. Patient will benefit from skilled OT to address above impairments and improve overall function.  REHAB POTENTIAL: Good  PLAN:  OT FREQUENCY: 1x/week  OT DURATION: 6 weeks  PLANNED INTERVENTIONS: self care/ADL training, therapeutic exercise, therapeutic activity, neuromuscular re-education, manual therapy, passive range of motion, splinting, electrical stimulation, ultrasound, moist heat, patient/family education, and Re-evaluation  RECOMMENDED OTHER SERVICES: N/A for this visit  CONSULTED AND AGREED WITH PLAN OF CARE: Patient  PLAN FOR NEXT SESSION: taping PRN; review use of NMES unit from home; review HEP  Delana Meyer, OT 11/10/2023, 1:29 PM

## 2023-11-10 NOTE — Patient Instructions (Signed)
 Best Electrode Placement for Arm and Hand Stroke Rehabilitation Electrical stimulation, also referred to as e-stim, NMES, or FES, can be an effective tool in reducing the symptoms of stroke, such as increasing strength and function. The success of one's recovery using electrical stimulation will rely heavily on proper electrode placement.  Listed below are some key video examples of upper limb electrode positioning by Axelgaard. Click on the thumbnail below to visit the video link. Electrode Positions for Common UE Stroke Muscle Groups 1. SHOULDER Retraction - shoulder blade moves back toward spine  2. SHOULDER Flexion (black in front, red in back) - arm comes forward 3. SHOULDER Abduction (red in front, black in back) - arm comes away from body and out to side       4. ELBOW Flexion - elbow bends  5. ELBOW Extension - elbow straightens             6. WRIST/FINGER Extension - wrist comes up and fingers straighten  7. FINGER Flexion - fingers bend             8. THUMB Abduction - thumb moves away from palm  9. THUMB Opposition - thumb moves toward pad of index finger   Information obtained from [email or web address] Please visit this website for videos and additional information as needed.

## 2023-11-17 ENCOUNTER — Ambulatory Visit: Payer: Managed Care, Other (non HMO) | Attending: Family Medicine | Admitting: Occupational Therapy

## 2023-11-17 DIAGNOSIS — R29818 Other symptoms and signs involving the nervous system: Secondary | ICD-10-CM | POA: Diagnosis present

## 2023-11-17 DIAGNOSIS — R208 Other disturbances of skin sensation: Secondary | ICD-10-CM

## 2023-11-17 DIAGNOSIS — R278 Other lack of coordination: Secondary | ICD-10-CM

## 2023-11-17 DIAGNOSIS — M6281 Muscle weakness (generalized): Secondary | ICD-10-CM

## 2023-11-17 NOTE — Patient Instructions (Signed)
 Best Electrode Placement for Arm and Hand Stroke Rehabilitation Electrical stimulation, also referred to as e-stim, NMES, or FES, can be an effective tool in reducing the symptoms of stroke, such as increasing strength and function. The success of one's recovery using electrical stimulation will rely heavily on proper electrode placement.  Listed below are some key video examples of upper limb electrode positioning by Axelgaard. Click on the thumbnail below to visit the video link. Electrode Positions for Common UE Stroke Muscle Groups 1. SHOULDER Retraction - shoulder blade moves back toward spine  2. SHOULDER Flexion (black in front, red in back) - arm comes forward 3. SHOULDER Abduction (red in front, black in back) - arm comes away from body and out to side       4. ELBOW Flexion - elbow bends  5. ELBOW Extension - elbow straightens             6. WRIST/FINGER Extension - wrist comes up and fingers straighten  7. FINGER Flexion - fingers bend             8. THUMB Abduction - thumb moves away from palm  9. THUMB Opposition - thumb moves toward pad of index finger   Information obtained from [email or web address] Please visit this website for videos and additional information as needed.

## 2023-11-17 NOTE — Therapy (Signed)
 OUTPATIENT OCCUPATIONAL THERAPY NEURO TREATMENT  Patient Name: Vincent Ferrell MRN: [id number] DOB:[birth year], 63 y.o., male Today's Date: 11/17/2023  PCP: Toy Care, DO REFERRING PROVIDER: Erick Colace, MD   OT End of Session - 11/17/23 [phone]     Visit Number 3    Number of Visits 7    Date for OT Re-Evaluation 12/16/23    Authorization Type [employer] - 90 OT, PT, ST VL combined    OT Start Time 0937    OT Stop Time 1016    OT Time Calculation (min) 39 min    Activity Tolerance Patient tolerated treatment well    Behavior During Therapy [facility] for tasks assessed/performed             Past Medical History:  Diagnosis Date   Chest pain in adult 10/24/2017   Elevated BP without diagnosis of hypertension 10/24/2017   GERD (gastroesophageal reflux disease)    Hypertension    Impaired fasting glucose 10/24/2017   Mixed hyperlipidemia 10/24/2017   Multifocal pneumonia 12/30/2021   Rising PSA level 10/24/2017   Stroke [facility])    Past Surgical History:  Procedure Laterality Date   COLONOSCOPY WITH PROPOFOL N/A 12/28/2021   Procedure: COLONOSCOPY WITH PROPOFOL;  Surgeon: Tressia Danas, MD;  Location: [facility] ENDOSCOPY;  Service: Gastroenterology;  Laterality: N/A;   COLONOSCOPY WITH PROPOFOL N/A 12/29/2021   Procedure: COLONOSCOPY WITH PROPOFOL;  Surgeon: Tressia Danas, MD;  Location: [facility] ENDOSCOPY;  Service: Gastroenterology;  Laterality: N/A;   ESOPHAGOGASTRODUODENOSCOPY (EGD) WITH PROPOFOL N/A 12/29/2021   Procedure: ESOPHAGOGASTRODUODENOSCOPY (EGD) WITH PROPOFOL;  Surgeon: Tressia Danas, MD;  Location: [facility] ENDOSCOPY;  Service: Gastroenterology;  Laterality: N/A;   ESOPHAGOGASTRODUODENOSCOPY (EGD) WITH PROPOFOL  01/05/2022   Procedure: ESOPHAGOGASTRODUODENOSCOPY (EGD) WITH PROPOFOL;  Surgeon: Diamantina Monks, MD;  Location: [facility] ENDOSCOPY;  Service: General;;   IR CT HEAD LTD  12/25/2021   IR PERCUTANEOUS ART THROMBECTOMY/INFUSION INTRACRANIAL INC DIAG ANGIO  12/25/2021    PEG PLACEMENT N/A 01/05/2022   Procedure: PERCUTANEOUS ENDOSCOPIC GASTROSTOMY (PEG) PLACEMENT;  Surgeon: Diamantina Monks, MD;  Location: [facility] ENDOSCOPY;  Service: General;  Laterality: N/A;   RADIOLOGY WITH ANESTHESIA N/A 12/25/2021   Procedure: IR WITH ANESTHESIA;  Surgeon: Radiologist, Medication, MD;  Location: [facility] OR;  Service: Radiology;  Laterality: N/A;   TRANSURETHRAL RESECTION OF PROSTATE N/A 12/29/2021   Procedure: TRANSURETHRAL RESECTION OF THE PROSTATE (TURP);  Surgeon: Crista Elliot, MD;  Location: [facility] OR;  Service: Urology;  Laterality: N/A;   Patient Active Problem List   Diagnosis Date Noted   Spastic hemiplegia affecting nondominant side (HCC) 07/29/2023   Encephalopathy acute 01/07/2022   Severe protein-calorie malnutrition (HCC) 01/07/2022   Physical deconditioning 01/07/2022   Diarrhea 01/07/2022   Pericardial effusion    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   Prostate abscess 12/30/2021   Acute kidney injury (HCC) 12/30/2021   Anemia due to GI blood loss    Acute respiratory failure with hypoxia (HCC)    Acute ischemic right MCA stroke (HCC) 12/25/2021   Sinus tachycardia    Chest pain in adult 10/24/2017   Mixed hyperlipidemia 10/24/2017   Rising PSA level 10/24/2017   Impaired fasting glucose 10/24/2017   Elevated BP without diagnosis of hypertension 10/24/2017    Rationale for Evaluation and Treatment: Rehabilitation  ONSET DATE: 10/21/2023 (date of referral)  REFERRING DIAG: G81.10 (ICD-10-CM) - Spastic hemiplegia affecting nondominant side   THERAPY DIAG:  Other symptoms and signs involving the nervous system  Other lack of coordination  Muscle weakness (generalized)  Other disturbances of skin sensation  SUBJECTIVE:   SUBJECTIVE STATEMENT: Pt reports he has been able to use NMES at home   Pt accompanied by: self  PERTINENT HISTORY: PMH: endocarditis, HTN, HLD  MRI from April, 2024 showed subacute ischemia in the right MCA, right  occipital lobe, left frontal and parietal white matter and petechial blood in the left parietal lobe   PRECAUTIONS: None  WEIGHT BEARING RESTRICTIONS No  PAIN:  Are you having pain? Yes: NPRS scale: 1/10 Pain location: L hand Pain description: sore, stiff  FALLS: Has patient fallen in last 6 months? Yes. 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 GOALS Improve use of LUE  OBJECTIVE:   HAND DOMINANCE: Right  FUNCTIONAL OUTCOME MEASURES: Quick Dash: 18.2 % disability with use of LUE   UPPER EXTREMITY ROM:     AROM Right (eval) Left (eval)  Shoulder flexion WNL 90  Shoulder abduction WNL 70  Elbow flexion WNL 120  Elbow extension WNL WNL  Wrist flexion WNL 35  Wrist extension WNL 70  Wrist pronation WNL WNL  Wrist supination WNL 10   Digit Composite Flexion WNL Lacks 8 cm thumb, 2 cm ring finger, and 3 cm pinky  Digit Composite Extension WNL Trace movements  Digit Opposition WNL To index only  (Blank rows = not tested)  HAND FUNCTION: Grip strength: Right: 65 lbs; Left: unable to register (manual)  COORDINATION: Box and Blocks:   Left 2 blocks  SENSATION: Occasional paresthesias reported  MUSCLE TONE: LUE: Mild and Hypertonic ; uses Baclofen and heating pad  COGNITION: Overall cognitive status: Within functional limits for tasks assessed  PERCEPTION: Not tested  PRAXIS: Not tested  TODAY'S TREATMENT:                                                                                                                               OT reviewed use of NMES unit as noted in patient instructions for L wrist and digit extension. Therapist educated patient on application for L digit flexion. OT educated on reciprocal mode between both applications in  conjunction with release and grasp of items (respectively) or stretch as provided by therapist during on times. Patient able to teach back information provided and tolerated supervised application to promote improved AROM and pain management.   Modalities Skin Integrity Assessment:  NMES utilized: No redness, irritation or skin integrity concerns were noted before, during and/or after use. Patient was informed of risks and benefits to treatment today. Skin integrity prior to treatment: Intact. Skin integrity after treatment: Intact.   PATIENT EDUCATION: Education details: [employer] Use Person educated: Patient Education method: Explanation, demonstrated, handouts Education comprehension: verbalized understanding, returned demonstration, requires further review/instruction  HOME EXERCISE PROGRAM: 11/10/2023: NMES placement  and parameter tables   GOALS:  SHORT TERM GOALS: Target date: 12/01/2023  Patient will demonstrate independence with initial LUE HEP. Baseline: Goal status: INITIAL  2.  OT to assess L grip strength and set appropriate goal. Baseline: unable to register force Goal status: INITIAL   LONG TERM GOALS: Target date: 12/16/2023  Patient will demonstrate updated LUE HEP with 25% verbal cues or less for proper execution. Baseline:  Goal status: INITIAL  2.  Pt will be able to place at least 7 blocks using left hand with completion of Box and Blocks test. Baseline: 2 blocks Goal status: INITIAL  3.  Pt to demonstrate improved L thumb abduction AROM as needed to position business card between 1st and 2nd digits.  Baseline: unable to complete Goal status: INITIAL  4.  Pt to demonstrate at least 50% L composite extension AROM as needed to grasp items in hand. Baseline: <25% Goal status: INITIAL  ASSESSMENT:  CLINICAL IMPRESSION: Patient demonstrates improved understanding of NMES unit use as needed to progress towards goals. Good overall tolerance despite inability to  perform full digit extension.   PERFORMANCE DEFICITS in functional skills including ADLs, IADLs, coordination, dexterity, edema, tone, ROM, strength, Fine motor control, and UE functional use.   IMPAIRMENTS are limiting patient from ADLs, IADLs, and leisure.   COMORBIDITIES may have co-morbidities  that affects occupational performance. Patient will benefit from skilled OT to address above impairments and improve overall function.  REHAB POTENTIAL: Good  PLAN:  OT FREQUENCY: 1x/week  OT DURATION: 6 weeks  PLANNED INTERVENTIONS: self care/ADL training, therapeutic exercise, therapeutic activity, neuromuscular re-education, manual therapy, passive range of motion, splinting, electrical stimulation, ultrasound, moist heat, patient/family education, and Re-evaluation  RECOMMENDED OTHER SERVICES: N/A for this visit  CONSULTED AND AGREED WITH PLAN OF CARE: Patient  PLAN FOR NEXT SESSION: taping PRN; review use of NMES unit from home (specifically fingers and shoulder); review HEP  Delana Meyer, OT 11/17/2023, 1:06 PM

## 2023-11-23 ENCOUNTER — Ambulatory Visit: Payer: Managed Care, Other (non HMO) | Admitting: Occupational Therapy

## 2023-11-23 DIAGNOSIS — M6281 Muscle weakness (generalized): Secondary | ICD-10-CM

## 2023-11-23 DIAGNOSIS — R278 Other lack of coordination: Secondary | ICD-10-CM

## 2023-11-23 DIAGNOSIS — R208 Other disturbances of skin sensation: Secondary | ICD-10-CM

## 2023-11-23 DIAGNOSIS — R29818 Other symptoms and signs involving the nervous system: Secondary | ICD-10-CM

## 2023-11-23 NOTE — Therapy (Signed)
 OUTPATIENT OCCUPATIONAL THERAPY NEURO TREATMENT  Patient Name: Vincent Ferrell MRN: [id number] DOB:[birth year], 63 y.o., male Today's Date: 11/23/2023  PCP: Toy Care, DO REFERRING PROVIDER: Erick Colace, MD   OT End of Session - 11/23/23 0933     Visit Number 4    Number of Visits 7    Date for OT Re-Evaluation 12/16/23    Authorization Type [employer] - 90 OT, PT, ST VL combined    OT Start Time 0933    OT Stop Time 1015    OT Time Calculation (min) 42 min    Activity Tolerance Patient tolerated treatment well    Behavior During Therapy [facility] for tasks assessed/performed             Past Medical History:  Diagnosis Date   Chest pain in adult 10/24/2017   Elevated BP without diagnosis of hypertension 10/24/2017   GERD (gastroesophageal reflux disease)    Hypertension    Impaired fasting glucose 10/24/2017   Mixed hyperlipidemia 10/24/2017   Multifocal pneumonia 12/30/2021   Rising PSA level 10/24/2017   Stroke [facility])    Past Surgical History:  Procedure Laterality Date   COLONOSCOPY WITH PROPOFOL N/A 12/28/2021   Procedure: COLONOSCOPY WITH PROPOFOL;  Surgeon: Tressia Danas, MD;  Location: [facility] ENDOSCOPY;  Service: Gastroenterology;  Laterality: N/A;   COLONOSCOPY WITH PROPOFOL N/A 12/29/2021   Procedure: COLONOSCOPY WITH PROPOFOL;  Surgeon: Tressia Danas, MD;  Location: [facility] ENDOSCOPY;  Service: Gastroenterology;  Laterality: N/A;   ESOPHAGOGASTRODUODENOSCOPY (EGD) WITH PROPOFOL N/A 12/29/2021   Procedure: ESOPHAGOGASTRODUODENOSCOPY (EGD) WITH PROPOFOL;  Surgeon: Tressia Danas, MD;  Location: [facility] ENDOSCOPY;  Service: Gastroenterology;  Laterality: N/A;   ESOPHAGOGASTRODUODENOSCOPY (EGD) WITH PROPOFOL  01/05/2022   Procedure: ESOPHAGOGASTRODUODENOSCOPY (EGD) WITH PROPOFOL;  Surgeon: Diamantina Monks, MD;  Location: [facility] ENDOSCOPY;  Service: General;;   IR CT HEAD LTD  12/25/2021   IR PERCUTANEOUS ART THROMBECTOMY/INFUSION INTRACRANIAL INC DIAG ANGIO  12/25/2021    PEG PLACEMENT N/A 01/05/2022   Procedure: PERCUTANEOUS ENDOSCOPIC GASTROSTOMY (PEG) PLACEMENT;  Surgeon: Diamantina Monks, MD;  Location: [facility] ENDOSCOPY;  Service: General;  Laterality: N/A;   RADIOLOGY WITH ANESTHESIA N/A 12/25/2021   Procedure: IR WITH ANESTHESIA;  Surgeon: Radiologist, Medication, MD;  Location: [facility] OR;  Service: Radiology;  Laterality: N/A;   TRANSURETHRAL RESECTION OF PROSTATE N/A 12/29/2021   Procedure: TRANSURETHRAL RESECTION OF THE PROSTATE (TURP);  Surgeon: Crista Elliot, MD;  Location: [facility] OR;  Service: Urology;  Laterality: N/A;   Patient Active Problem List   Diagnosis Date Noted   Spastic hemiplegia affecting nondominant side (HCC) 07/29/2023   Encephalopathy acute 01/07/2022   Severe protein-calorie malnutrition (HCC) 01/07/2022   Physical deconditioning 01/07/2022   Diarrhea 01/07/2022   Pericardial effusion    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   Prostate abscess 12/30/2021   Acute kidney injury (HCC) 12/30/2021   Anemia due to GI blood loss    Acute respiratory failure with hypoxia (HCC)    Acute ischemic right MCA stroke (HCC) 12/25/2021   Sinus tachycardia    Chest pain in adult 10/24/2017   Mixed hyperlipidemia 10/24/2017   Rising PSA level 10/24/2017   Impaired fasting glucose 10/24/2017   Elevated BP without diagnosis of hypertension 10/24/2017    Rationale for Evaluation and Treatment: Rehabilitation  ONSET DATE: 10/21/2023 (date of referral)  REFERRING DIAG: G81.10 (ICD-10-CM) - Spastic hemiplegia affecting nondominant side   THERAPY DIAG:  Other symptoms and signs involving the nervous system  Other lack of coordination  Muscle weakness (generalized)  Other disturbances of skin sensation  SUBJECTIVE:   SUBJECTIVE STATEMENT: Pt reports he definitely feels the clinic e-stim is much stronger than his home NMES unit.   Pt accompanied by: self  PERTINENT HISTORY: PMH: endocarditis, HTN, HLD  MRI from April, 2024 showed  subacute ischemia in the right MCA, right occipital lobe, left frontal and parietal white matter and petechial blood in the left parietal lobe   PRECAUTIONS: None  WEIGHT BEARING RESTRICTIONS No  PAIN:  Are you having pain? Yes: NPRS scale: 1/10 Pain location: L hand Pain description: sore, stiff  FALLS: Has patient fallen in last 6 months? Yes. 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 GOALS Improve use of LUE  OBJECTIVE:   HAND DOMINANCE: Right  FUNCTIONAL OUTCOME MEASURES: Quick Dash: 18.2 % disability with use of LUE   UPPER EXTREMITY ROM:     AROM Right (eval) Left (eval)  Shoulder flexion WNL 90  Shoulder abduction WNL 70  Elbow flexion WNL 120  Elbow extension WNL WNL  Wrist flexion WNL 35  Wrist extension WNL 70  Wrist pronation WNL WNL  Wrist supination WNL 10   Digit Composite Flexion WNL Lacks 8 cm thumb, 2 cm ring finger, and 3 cm pinky  Digit Composite Extension WNL Trace movements  Digit Opposition WNL To index only  (Blank rows = not tested)  HAND FUNCTION: Grip strength: Right: 65 lbs; Left: unable to register (manual)  COORDINATION: Box and Blocks:   Left 2 blocks  SENSATION: Occasional paresthesias reported  MUSCLE TONE: LUE: Mild and Hypertonic ; uses Baclofen and heating pad  COGNITION: Overall cognitive status: Within functional limits for tasks assessed  PERCEPTION: Not tested  PRAXIS: Not tested  TODAY'S TREATMENT:                                                                                                                               OT educated use of NMES unit as noted in patient instructions for L thumb extension as well as shoulder retraction. Therapist educated patient on application for L digit flexion. With  thumb application, pt practiced release and grasp of items during on and off cycles (respectively). Patient able to teach back information provided and tolerated supervised application to promote improved AROM and pain management. Therapist also utilized e-stim to further encourage nerve function as needed to produce more active LUE thumb extension. Therapist utilized e-stim Russian waveform with 10/10 cycle time, 50% duty cycle, burst frequency of 50 bps, and ramp of 2 seconds. Patient tolerating up to 35 intensity with supervised application to promote improved AROM and pain management.   Modalities Skin Integrity Assessment:  NMES and e-stim utilized: No redness, irritation or skin integrity concerns were noted before, during  and/or after use. Patient was informed of risks and benefits to treatment today. Skin integrity prior to treatment: Intact. Skin integrity after treatment: Intact.   OT educated pt on how to apply electrodes for L shoulder retraction using shoe horn to help with reach. Pt also encouraged to use mirror to confirm proper application in addition to assuring proper LUE movements.   Pt inquiring about use of pot stabilizer for cooking on the stove. Information provided on how to obtain.   PATIENT EDUCATION: Education details: NMES and e-stim Use; adaptive strategies Person educated: Patient Education method: Explanation, demonstrated, handouts Education comprehension: verbalized understanding, returned demonstration, requires further review/instruction  HOME EXERCISE PROGRAM: 11/10/2023: NMES placement and parameter tables  11/23/2023: Thumb extension NMES application  GOALS:  SHORT TERM GOALS: Target date: 12/01/2023  Patient will demonstrate independence with initial LUE HEP. Baseline: Goal status: INITIAL  2.  OT to assess L grip strength and set appropriate goal. Baseline: unable to register force Goal status: INITIAL   LONG TERM GOALS: Target date:  12/16/2023  Patient will demonstrate updated LUE HEP with 25% verbal cues or less for proper execution. Baseline:  Goal status: INITIAL  2.  Pt will be able to place at least 7 blocks using left hand with completion of Box and Blocks test. Baseline: 2 blocks Goal status: INITIAL  3.  Pt to demonstrate improved L thumb abduction AROM as needed to position business card between 1st and 2nd digits.  Baseline: unable to complete Goal status: INITIAL  4.  Pt to demonstrate at least 50% L composite extension AROM as needed to grasp items in hand. Baseline: <25% Goal status: INITIAL  ASSESSMENT:  CLINICAL IMPRESSION: Patient limited by strength of home NMES unit as needed to produce desired L digit extension but demonstrates good tolerance with e-stim unit in clinic. Will continue application to promote desired LUE AROM.  PERFORMANCE DEFICITS in functional skills including ADLs, IADLs, coordination, dexterity, edema, tone, ROM, strength, Fine motor control, and UE functional use.   IMPAIRMENTS are limiting patient from ADLs, IADLs, and leisure.   COMORBIDITIES may have co-morbidities  that affects occupational performance. Patient will benefit from skilled OT to address above impairments and improve overall function.  REHAB POTENTIAL: Good  PLAN:  OT FREQUENCY: 1x/week  OT DURATION: 6 weeks  PLANNED INTERVENTIONS: self care/ADL training, therapeutic exercise, therapeutic activity, neuromuscular re-education, manual therapy, passive range of motion, splinting, electrical stimulation, ultrasound, moist heat, patient/family education, and Re-evaluation  RECOMMENDED OTHER SERVICES: N/A for this visit  CONSULTED AND AGREED WITH PLAN OF CARE: Patient  PLAN FOR NEXT SESSION: taping PRN; review use of NMES unit from home (specifically finger ext), how is shoulder application going at home; review HEP  Delana Meyer, OT 11/23/2023, 10:27 AM

## 2023-11-23 NOTE — Patient Instructions (Addendum)
  [email or web address]

## 2023-12-01 ENCOUNTER — Ambulatory Visit: Payer: Managed Care, Other (non HMO) | Admitting: Occupational Therapy

## 2023-12-01 DIAGNOSIS — R29818 Other symptoms and signs involving the nervous system: Secondary | ICD-10-CM | POA: Diagnosis not present

## 2023-12-01 DIAGNOSIS — R208 Other disturbances of skin sensation: Secondary | ICD-10-CM

## 2023-12-01 DIAGNOSIS — R278 Other lack of coordination: Secondary | ICD-10-CM

## 2023-12-01 DIAGNOSIS — M6281 Muscle weakness (generalized): Secondary | ICD-10-CM

## 2023-12-01 NOTE — Therapy (Unsigned)
 OUTPATIENT OCCUPATIONAL THERAPY NEURO TREATMENT  Patient Name: Vincent Ferrell MRN: [id number] DOB:1960/11/17, 62 y.o., male Today's Date: 12/01/2023  PCP: Toy Care, DO REFERRING PROVIDER: Erick Colace, MD   OT End of Session - 12/01/23 [phone]     Visit Number 5    Number of Visits 13    Date for OT Re-Evaluation 01/26/24    Authorization Type [employer] - 90 OT, PT, ST VL combined    OT Start Time 0935    OT Stop Time 1018    OT Time Calculation (min) 43 min    Activity Tolerance Patient tolerated treatment well    Behavior During Therapy [facility] for tasks assessed/performed             Past Medical History:  Diagnosis Date   Chest pain in adult 10/24/2017   Elevated BP without diagnosis of hypertension 10/24/2017   GERD (gastroesophageal reflux disease)    Hypertension    Impaired fasting glucose 10/24/2017   Mixed hyperlipidemia 10/24/2017   Multifocal pneumonia 12/30/2021   Rising PSA level 10/24/2017   Stroke [facility])    Past Surgical History:  Procedure Laterality Date   COLONOSCOPY WITH PROPOFOL N/A 12/28/2021   Procedure: COLONOSCOPY WITH PROPOFOL;  Surgeon: Tressia Danas, MD;  Location: [facility] ENDOSCOPY;  Service: Gastroenterology;  Laterality: N/A;   COLONOSCOPY WITH PROPOFOL N/A 12/29/2021   Procedure: COLONOSCOPY WITH PROPOFOL;  Surgeon: Tressia Danas, MD;  Location: [facility] ENDOSCOPY;  Service: Gastroenterology;  Laterality: N/A;   ESOPHAGOGASTRODUODENOSCOPY (EGD) WITH PROPOFOL N/A 12/29/2021   Procedure: ESOPHAGOGASTRODUODENOSCOPY (EGD) WITH PROPOFOL;  Surgeon: Tressia Danas, MD;  Location: [facility] ENDOSCOPY;  Service: Gastroenterology;  Laterality: N/A;   ESOPHAGOGASTRODUODENOSCOPY (EGD) WITH PROPOFOL  01/05/2022   Procedure: ESOPHAGOGASTRODUODENOSCOPY (EGD) WITH PROPOFOL;  Surgeon: Diamantina Monks, MD;  Location: [facility] ENDOSCOPY;  Service: General;;   IR CT HEAD LTD  12/25/2021   IR PERCUTANEOUS ART THROMBECTOMY/INFUSION INTRACRANIAL INC DIAG ANGIO  12/25/2021    PEG PLACEMENT N/A 01/05/2022   Procedure: PERCUTANEOUS ENDOSCOPIC GASTROSTOMY (PEG) PLACEMENT;  Surgeon: Diamantina Monks, MD;  Location: [facility] ENDOSCOPY;  Service: General;  Laterality: N/A;   RADIOLOGY WITH ANESTHESIA N/A 12/25/2021   Procedure: IR WITH ANESTHESIA;  Surgeon: Radiologist, Medication, MD;  Location: [facility] OR;  Service: Radiology;  Laterality: N/A;   TRANSURETHRAL RESECTION OF PROSTATE N/A 12/29/2021   Procedure: TRANSURETHRAL RESECTION OF THE PROSTATE (TURP);  Surgeon: Crista Elliot, MD;  Location: [facility] OR;  Service: Urology;  Laterality: N/A;   Patient Active Problem List   Diagnosis Date Noted   Spastic hemiplegia affecting nondominant side (HCC) 07/29/2023   Encephalopathy acute 01/07/2022   Severe protein-calorie malnutrition (HCC) 01/07/2022   Physical deconditioning 01/07/2022   Diarrhea 01/07/2022   Pericardial effusion    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   Prostate abscess 12/30/2021   Acute kidney injury (HCC) 12/30/2021   Anemia due to GI blood loss    Acute respiratory failure with hypoxia (HCC)    Acute ischemic right MCA stroke (HCC) 12/25/2021   Sinus tachycardia    Chest pain in adult 10/24/2017   Mixed hyperlipidemia 10/24/2017   Rising PSA level 10/24/2017   Impaired fasting glucose 10/24/2017   Elevated BP without diagnosis of hypertension 10/24/2017    Rationale for Evaluation and Treatment: Rehabilitation  ONSET DATE: 10/21/2023 (date of referral)  REFERRING DIAG: G81.10 (ICD-10-CM) - Spastic hemiplegia affecting nondominant side   THERAPY DIAG:  Other symptoms and signs involving the nervous system  Other lack of coordination  Muscle weakness (generalized)  Other disturbances of skin sensation  SUBJECTIVE:   SUBJECTIVE STATEMENT: Pt reports he definitely feels the clinic e-stim is much stronger than his home NMES unit.   Pt accompanied by: self  PERTINENT HISTORY: PMH: endocarditis, HTN, HLD  MRI from April, 2024 showed  subacute ischemia in the right MCA, right occipital lobe, left frontal and parietal white matter and petechial blood in the left parietal lobe   PRECAUTIONS: None  WEIGHT BEARING RESTRICTIONS No  PAIN:  Are you having pain? Yes: NPRS scale: 1/10 Pain location: L hand Pain description: sore, stiff  FALLS: Has patient fallen in last 6 months? Yes. 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 GOALS Improve use of LUE  OBJECTIVE:   HAND DOMINANCE: Right  FUNCTIONAL OUTCOME MEASURES: Quick Dash: 18.2 % disability with use of LUE   UPPER EXTREMITY ROM:     AROM Right (eval) Left (eval)  Shoulder flexion WNL 90  Shoulder abduction WNL 70  Elbow flexion WNL 120  Elbow extension WNL WNL  Wrist flexion WNL 35  Wrist extension WNL 70  Wrist pronation WNL WNL  Wrist supination WNL 10   Digit Composite Flexion WNL Lacks 8 cm thumb, 2 cm ring finger, and 3 cm pinky  Digit Composite Extension WNL Trace movements  Digit Opposition WNL To index only  (Blank rows = not tested)  HAND FUNCTION: Grip strength: Right: 65 lbs; Left: unable to register (manual)  COORDINATION: Box and Blocks:   Left 2 blocks  SENSATION: Occasional paresthesias reported  MUSCLE TONE: LUE: Mild and Hypertonic ; uses Baclofen and heating pad  COGNITION: Overall cognitive status: Within functional limits for tasks assessed  PERCEPTION: Not tested  PRAXIS: Not tested  TODAY'S TREATMENT:                                                                                                                               OT educated use of NMES unit as noted in patient instructions for L thumb extension as well as shoulder retraction. Therapist educated patient on application for L digit flexion. With  thumb application, pt practiced release and grasp of items during on and off cycles (respectively). Patient able to teach back information provided and tolerated supervised application to promote improved AROM and pain management. Therapist also utilized e-stim to further encourage nerve function as needed to produce more active LUE thumb extension. Therapist utilized e-stim Russian waveform with 10/10 cycle time, 50% duty cycle, burst frequency of 50 bps, and ramp of 2 seconds. Patient tolerating up to 35 intensity with supervised application to promote improved AROM and pain management.   Modalities Skin Integrity Assessment:  NMES and e-stim utilized: No redness, irritation or skin integrity concerns were noted before, during  and/or after use. Patient was informed of risks and benefits to treatment today. Skin integrity prior to treatment: Intact. Skin integrity after treatment: Intact.   OT educated pt on how to apply electrodes for L shoulder retraction using shoe horn to help with reach. Pt also encouraged to use mirror to confirm proper application in addition to assuring proper LUE movements.   Pt inquiring about use of pot stabilizer for cooking on the stove. Information provided on how to obtain.   PATIENT EDUCATION: Education details: NMES and e-stim Use; adaptive strategies Person educated: Patient Education method: Explanation, demonstrated, handouts Education comprehension: verbalized understanding, returned demonstration, requires further review/instruction  HOME EXERCISE PROGRAM: 11/10/2023: NMES placement and parameter tables  11/23/2023: Thumb extension NMES application  GOALS:  SHORT TERM GOALS: Target date: 12/01/2023  Patient will demonstrate independence with initial LUE HEP. Baseline: Goal status: INITIAL  2.  OT to assess L grip strength and set appropriate goal. Baseline: unable to register force Goal status: INITIAL   LONG TERM GOALS: Target date: 01/26/2024    Patient will demonstrate updated LUE HEP with 25% verbal cues or less for proper execution. Baseline:  Goal status: INITIAL  2.  Pt will be able to place at least 7 blocks using left hand with completion of Box and Blocks test. Baseline: 2 blocks Goal status: INITIAL  3.  Pt to demonstrate improved L thumb abduction AROM as needed to position business card between 1st and 2nd digits.  Baseline: unable to complete Goal status: INITIAL  4.  Pt to demonstrate at least 50% L composite extension AROM as needed to grasp items in hand. Baseline: <25% Goal status: INITIAL  ASSESSMENT:  CLINICAL IMPRESSION: Patient limited by strength of home NMES unit as needed to produce desired L digit extension but demonstrates good tolerance with e-stim unit in clinic. Will continue application to promote desired LUE AROM.  PERFORMANCE DEFICITS in functional skills including ADLs, IADLs, coordination, dexterity, edema, tone, ROM, strength, Fine motor control, and UE functional use.   IMPAIRMENTS are limiting patient from ADLs, IADLs, and leisure.   COMORBIDITIES may have co-morbidities  that affects occupational performance. Patient will benefit from skilled OT to address above impairments and improve overall function.  REHAB POTENTIAL: Good  PLAN:  OT FREQUENCY: 1x/week  OT DURATION: 6 weeks  PLANNED INTERVENTIONS: self care/ADL training, therapeutic exercise, therapeutic activity, neuromuscular re-education, manual therapy, passive range of motion, splinting, electrical stimulation, ultrasound, moist heat, patient/family education, and Re-evaluation  RECOMMENDED OTHER SERVICES: N/A for this visit  CONSULTED AND AGREED WITH PLAN OF CARE: Patient  PLAN FOR NEXT SESSION: taping PRN; review use of NMES unit from home (specifically combo patterns), how is shoulder application going at home; review HEP  Delana Meyer, OT 12/01/2023, 10:32 AM

## 2023-12-02 ENCOUNTER — Encounter: Payer: Self-pay | Admitting: Physical Medicine & Rehabilitation

## 2023-12-02 ENCOUNTER — Encounter
Payer: Managed Care, Other (non HMO) | Attending: Physical Medicine & Rehabilitation | Admitting: Physical Medicine & Rehabilitation

## 2023-12-02 VITALS — BP 129/86 | HR 49 | Ht 74.0 in | Wt 212.0 lb

## 2023-12-02 DIAGNOSIS — G8110 Spastic hemiplegia affecting unspecified side: Secondary | ICD-10-CM | POA: Diagnosis not present

## 2023-12-02 DIAGNOSIS — G8114 Spastic hemiplegia affecting left nondominant side: Secondary | ICD-10-CM | POA: Diagnosis not present

## 2023-12-02 NOTE — Progress Notes (Signed)
 Subjective:    Patient ID: Vincent Ferrell, male    DOB: 08-27-1961, 63 y.o.   MRN: [id number]  HPI 63 year old male with history of CVA follows up for stroke spasticity which is primarily affecting the left upper extremity  Botox 10/21/2023 Left   FDS 50 FDP 50 PT 50 FCR 50 Pain Inventory Average Pain 0 Pain Right Now 1 My pain is aching and crik in neck  In the last 24 hours, has pain interfered with the following? General activity 0 Relation with others 0 Enjoyment of life 0 What TIME of day is your pain at its worst? varies Sleep (in general) Fair  Pain is worse with:  turning neck Pain improves with: medication ibuprofen Relief from Meds: 10  Family History  Problem Relation Age of Onset   Stroke Neg Hx    Social History   Socioeconomic History   Marital status: Widowed    Spouse name: Not on file   Number of children: Not on file   Years of education: Not on file   Highest education level: Not on file  Occupational History   Not on file  Tobacco Use   Smoking status: Never    Passive exposure: Never   Smokeless tobacco: Never  Vaping Use   Vaping status: Never Used  Substance and Sexual Activity   Alcohol use: Yes    Comment: occ   Drug use: Never   Sexual activity: Not on file  Other Topics Concern   Not on file  Social History Narrative   Not on file   Social Drivers of Health   Financial Resource Strain: Low Risk  (11/24/2020)   Received from [facility] visits prior to 11/13/2022., [facility] visits prior to 11/13/2022.   Overall Financial Resource Strain (CARDIA)    Difficulty of Paying Living Expenses: Not hard at all  Food Insecurity: Low Risk  (05/09/2023)   [facility]   Hunger Vital Sign    Worried About Running Out of Food in the Last Year: Never true    Ran Out of Food in the Last Year: Never true  Transportation Needs: No Transportation Needs (05/09/2023)   Received from  [employer]    In the past 12 months, has lack of reliable transportation kept you from medical appointments, meetings, work or from getting things needed for daily living? : No  Physical Activity: Insufficiently Active (11/24/2020)   Received from [facility] visits prior to 11/13/2022., [facility] visits prior to 11/13/2022.   Exercise Vital Sign    Days of Exercise per Week: 1 day    Minutes of Exercise per Session: 20 min  Stress: No Stress Concern Present (11/24/2020)   [facility] visits prior to 11/13/2022., [facility] visits prior to 11/13/2022.   [employer] of Occupational Health - Occupational Stress Questionnaire    Feeling of Stress : Only a little  Social Connections: Unknown (02/19/2022)   Received from [facility], [facility]   Social Network    Social Network: Not on file   Past Surgical History:  Procedure Laterality Date   COLONOSCOPY WITH PROPOFOL N/A 12/28/2021   Procedure: COLONOSCOPY WITH PROPOFOL;  Surgeon: Tressia Danas, MD;  Location: [facility] ENDOSCOPY;  Service: Gastroenterology;  Laterality: N/A;   COLONOSCOPY WITH PROPOFOL N/A 12/29/2021   Procedure: COLONOSCOPY WITH PROPOFOL;  Surgeon: Tressia Danas,  MD;  Location: [facility] ENDOSCOPY;  Service: Gastroenterology;  Laterality: N/A;   ESOPHAGOGASTRODUODENOSCOPY (EGD) WITH PROPOFOL N/A 12/29/2021   Procedure: ESOPHAGOGASTRODUODENOSCOPY (EGD) WITH PROPOFOL;  Surgeon: Tressia Danas, MD;  Location: [facility] ENDOSCOPY;  Service: Gastroenterology;  Laterality: N/A;   ESOPHAGOGASTRODUODENOSCOPY (EGD) WITH PROPOFOL  01/05/2022   Procedure: ESOPHAGOGASTRODUODENOSCOPY (EGD) WITH PROPOFOL;  Surgeon: Diamantina Monks, MD;  Location: [facility] ENDOSCOPY;  Service: General;;   IR CT HEAD LTD  12/25/2021   IR PERCUTANEOUS ART THROMBECTOMY/INFUSION INTRACRANIAL INC DIAG ANGIO  12/25/2021   PEG PLACEMENT N/A 01/05/2022    Procedure: PERCUTANEOUS ENDOSCOPIC GASTROSTOMY (PEG) PLACEMENT;  Surgeon: Diamantina Monks, MD;  Location: [facility] ENDOSCOPY;  Service: General;  Laterality: N/A;   RADIOLOGY WITH ANESTHESIA N/A 12/25/2021   Procedure: IR WITH ANESTHESIA;  Surgeon: Radiologist, Medication, MD;  Location: [facility] OR;  Service: Radiology;  Laterality: N/A;   TRANSURETHRAL RESECTION OF PROSTATE N/A 12/29/2021   Procedure: TRANSURETHRAL RESECTION OF THE PROSTATE (TURP);  Surgeon: Crista Elliot, MD;  Location: [facility] OR;  Service: Urology;  Laterality: N/A;   Past Surgical History:  Procedure Laterality Date   COLONOSCOPY WITH PROPOFOL N/A 12/28/2021   Procedure: COLONOSCOPY WITH PROPOFOL;  Surgeon: Tressia Danas, MD;  Location: [facility] ENDOSCOPY;  Service: Gastroenterology;  Laterality: N/A;   COLONOSCOPY WITH PROPOFOL N/A 12/29/2021   Procedure: COLONOSCOPY WITH PROPOFOL;  Surgeon: Tressia Danas, MD;  Location: [facility] ENDOSCOPY;  Service: Gastroenterology;  Laterality: N/A;   ESOPHAGOGASTRODUODENOSCOPY (EGD) WITH PROPOFOL N/A 12/29/2021   Procedure: ESOPHAGOGASTRODUODENOSCOPY (EGD) WITH PROPOFOL;  Surgeon: Tressia Danas, MD;  Location: [facility] ENDOSCOPY;  Service: Gastroenterology;  Laterality: N/A;   ESOPHAGOGASTRODUODENOSCOPY (EGD) WITH PROPOFOL  01/05/2022   Procedure: ESOPHAGOGASTRODUODENOSCOPY (EGD) WITH PROPOFOL;  Surgeon: Diamantina Monks, MD;  Location: [facility] ENDOSCOPY;  Service: General;;   IR CT HEAD LTD  12/25/2021   IR PERCUTANEOUS ART THROMBECTOMY/INFUSION INTRACRANIAL INC DIAG ANGIO  12/25/2021   PEG PLACEMENT N/A 01/05/2022   Procedure: PERCUTANEOUS ENDOSCOPIC GASTROSTOMY (PEG) PLACEMENT;  Surgeon: Diamantina Monks, MD;  Location: [facility] ENDOSCOPY;  Service: General;  Laterality: N/A;   RADIOLOGY WITH ANESTHESIA N/A 12/25/2021   Procedure: IR WITH ANESTHESIA;  Surgeon: Radiologist, Medication, MD;  Location: [facility] OR;  Service: Radiology;  Laterality: N/A;   TRANSURETHRAL RESECTION OF PROSTATE N/A 12/29/2021   Procedure:  TRANSURETHRAL RESECTION OF THE PROSTATE (TURP);  Surgeon: Crista Elliot, MD;  Location: [facility] OR;  Service: Urology;  Laterality: N/A;   Past Medical History:  Diagnosis Date   Chest pain in adult 10/24/2017   Elevated BP without diagnosis of hypertension 10/24/2017   GERD (gastroesophageal reflux disease)    Hypertension    Impaired fasting glucose 10/24/2017   Mixed hyperlipidemia 10/24/2017   Multifocal pneumonia 12/30/2021   Rising PSA level 10/24/2017   Stroke (HCC)    BP 129/86   Pulse (!) 49   Ht 6\' 2"  (1.88 m)   Wt 212 lb (96.2 kg)   SpO2 99%   BMI 27.22 kg/m   Opioid Risk Score:   Fall Risk Score:  `1  Depression screen [facility] 2/9     12/02/2023    9:51 AM 09/09/2023    2:32 PM  Depression screen PHQ 2/9  Decreased Interest 0 0  Down, Depressed, Hopeless 0 0  PHQ - 2 Score 0 0     Review of Systems  Musculoskeletal:  Positive for neck stiffness.  All other systems reviewed and are negative.     Objective:   Physical Exam  General No acute distress mood and affect are appropriate Motor strength is 5/5 in the right upper extremity Left upper extremity 3 - at the deltoid bicep tricep finger /wrist flexors and 0/5 finger/wrist extensors Left upper extremity Tone MAS 3 at the elbow flexors MAS 3 at the wrist flexors MAS 3 at the finger flexors    Assessment & Plan:  1.  Left spastic hemiparesis affecting primarily left upper extremity.  He has increased tone in the finger and wrist flexors.  He is working with OT on Insurance risk surveyor stimulation.  He has a home unit as well.  We discussed that at 2 years post the amount of neurologic improvement is likely to be fairly minimal.  We can certainly work on his tone and enhancing any function that he has. We discussed that elbow flexor tone can also be addressed during the next injection.  Patient would like to get input from his occupational therapist whether she thinks that would be helpful for him.  I have  written a note to her on secure messaging. I will see him back in 6 weeks plan on repeating same botulinum toxin injection dose as well as muscle group selection. If he would like to incorporate the brachial radialis into the next injection would recommend 50 units

## 2023-12-08 ENCOUNTER — Ambulatory Visit: Payer: Managed Care, Other (non HMO) | Admitting: Occupational Therapy

## 2023-12-08 DIAGNOSIS — R278 Other lack of coordination: Secondary | ICD-10-CM

## 2023-12-08 DIAGNOSIS — R29818 Other symptoms and signs involving the nervous system: Secondary | ICD-10-CM | POA: Diagnosis not present

## 2023-12-08 DIAGNOSIS — M6281 Muscle weakness (generalized): Secondary | ICD-10-CM

## 2023-12-08 DIAGNOSIS — R208 Other disturbances of skin sensation: Secondary | ICD-10-CM

## 2023-12-08 NOTE — Patient Instructions (Signed)
 What is constraint-induced movement therapy (CIMT)? After a stroke, regaining strength and function in your weaker arm (the side weakened by the stroke) can be challenging. Constraint-Induced Movement Therapy (CIMT) involves intensive training of the weaker arm while restricting the use of the stronger arm. Specifically, the use of the stronger arm is restricted by the use of a mitten, sling, sitting on your opposite hand, or strapping the opposite hand to the side of your body for much of each day. The idea is to encourage you to use your weaker hand to do daily activities. This therapy has been studied by high quality research studies and has been found beneficial for arm function in some patients- especially those who already have some use of their arm and hand.  Recommend starting with 15 minute increments and gradually increasing.   Use of a mitten while writing. The mitten makes it impossible to use the good arm. The woman is forced to use her weaker hand to write  Typing:  Left-handed Words  wear were are bear bare care dear deer stare react dare  sad gear great rate  date red read fear free tea tear fast waste treat tease zest wax cast vase vast cat basket earn casket bee seat ate gas rare seed tax fax feed cart art tart taste ear here

## 2023-12-08 NOTE — Therapy (Unsigned)
 OUTPATIENT OCCUPATIONAL THERAPY NEURO TREATMENT   Patient Name: Vincent Ferrell MRN: [id number] DOB:02-20-1961, 63 y.o., male Today's Date: 12/08/2023  PCP: Toy Care, DO REFERRING PROVIDER: Erick Colace, MD   OT End of Session - 12/08/23 0935     Visit Number 6    Number of Visits 13    Date for OT Re-Evaluation 01/26/24    Authorization Type [employer] - 90 OT, PT, ST VL combined    OT Start Time 0935    OT Stop Time 1015    OT Time Calculation (min) 40 min    Activity Tolerance Patient tolerated treatment well    Behavior During Therapy [facility] for tasks assessed/performed             Past Medical History:  Diagnosis Date   Chest pain in adult 10/24/2017   Elevated BP without diagnosis of hypertension 10/24/2017   GERD (gastroesophageal reflux disease)    Hypertension    Impaired fasting glucose 10/24/2017   Mixed hyperlipidemia 10/24/2017   Multifocal pneumonia 12/30/2021   Rising PSA level 10/24/2017   Stroke [facility])    Past Surgical History:  Procedure Laterality Date   COLONOSCOPY WITH PROPOFOL N/A 12/28/2021   Procedure: COLONOSCOPY WITH PROPOFOL;  Surgeon: Tressia Danas, MD;  Location: [facility] ENDOSCOPY;  Service: Gastroenterology;  Laterality: N/A;   COLONOSCOPY WITH PROPOFOL N/A 12/29/2021   Procedure: COLONOSCOPY WITH PROPOFOL;  Surgeon: Tressia Danas, MD;  Location: [facility] ENDOSCOPY;  Service: Gastroenterology;  Laterality: N/A;   ESOPHAGOGASTRODUODENOSCOPY (EGD) WITH PROPOFOL N/A 12/29/2021   Procedure: ESOPHAGOGASTRODUODENOSCOPY (EGD) WITH PROPOFOL;  Surgeon: Tressia Danas, MD;  Location: [facility] ENDOSCOPY;  Service: Gastroenterology;  Laterality: N/A;   ESOPHAGOGASTRODUODENOSCOPY (EGD) WITH PROPOFOL  01/05/2022   Procedure: ESOPHAGOGASTRODUODENOSCOPY (EGD) WITH PROPOFOL;  Surgeon: Diamantina Monks, MD;  Location: [facility] ENDOSCOPY;  Service: General;;   IR CT HEAD LTD  12/25/2021   IR PERCUTANEOUS ART THROMBECTOMY/INFUSION INTRACRANIAL INC DIAG ANGIO   12/25/2021   PEG PLACEMENT N/A 01/05/2022   Procedure: PERCUTANEOUS ENDOSCOPIC GASTROSTOMY (PEG) PLACEMENT;  Surgeon: Diamantina Monks, MD;  Location: [facility] ENDOSCOPY;  Service: General;  Laterality: N/A;   RADIOLOGY WITH ANESTHESIA N/A 12/25/2021   Procedure: IR WITH ANESTHESIA;  Surgeon: Radiologist, Medication, MD;  Location: [facility] OR;  Service: Radiology;  Laterality: N/A;   TRANSURETHRAL RESECTION OF PROSTATE N/A 12/29/2021   Procedure: TRANSURETHRAL RESECTION OF THE PROSTATE (TURP);  Surgeon: Crista Elliot, MD;  Location: [facility] OR;  Service: Urology;  Laterality: N/A;   Patient Active Problem List   Diagnosis Date Noted   Spastic hemiplegia affecting nondominant side (HCC) 07/29/2023   Encephalopathy acute 01/07/2022   Severe protein-calorie malnutrition (HCC) 01/07/2022   Physical deconditioning 01/07/2022   Diarrhea 01/07/2022   Pericardial effusion    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   Prostate abscess 12/30/2021   Acute kidney injury (HCC) 12/30/2021   Anemia due to GI blood loss    Acute respiratory failure with hypoxia (HCC)    Acute ischemic right MCA stroke (HCC) 12/25/2021   Sinus tachycardia    Chest pain in adult 10/24/2017   Mixed hyperlipidemia 10/24/2017   Rising PSA level 10/24/2017   Impaired fasting glucose 10/24/2017   Elevated BP without diagnosis of hypertension 10/24/2017    Rationale for Evaluation and Treatment: Rehabilitation  ONSET DATE: 10/21/2023 (date of referral)  REFERRING DIAG: G81.10 (ICD-10-CM) - Spastic hemiplegia affecting nondominant side   THERAPY DIAG:  Other symptoms and signs involving the nervous system  Other lack of  coordination  Muscle weakness (generalized)  Other disturbances of skin sensation  SUBJECTIVE:   SUBJECTIVE STATEMENT: Pt reports he wants to keep proving people wrong in his rehabilitation. He states his grip strength today was the most he has ever had. He did use Voltaren this morning.  Pt accompanied by:  self  PERTINENT HISTORY: PMH: endocarditis, HTN, HLD  MRI from April, 2024 showed subacute ischemia in the right MCA, right occipital lobe, left frontal and parietal white matter and petechial blood in the left parietal lobe   PRECAUTIONS: None  WEIGHT BEARING RESTRICTIONS No  PAIN:  Are you having pain? Yes: NPRS scale: 1/10 Pain location: L hand Pain description: sore, stiff  FALLS: Has patient fallen in last 6 months? Yes. 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 GOALS Improve use of LUE  OBJECTIVE:   HAND DOMINANCE: Right  FUNCTIONAL OUTCOME MEASURES: Quick Dash: 18.2 % disability with use of LUE   UPPER EXTREMITY ROM:     AROM Right (eval) Left (eval)  Shoulder flexion WNL 90  Shoulder abduction WNL 70  Elbow flexion WNL 120  Elbow extension WNL WNL  Wrist flexion WNL 35  Wrist extension WNL 70  Wrist pronation WNL WNL  Wrist supination WNL 10   Digit Composite Flexion WNL Lacks 8 cm thumb, 2 cm ring finger, and 3 cm pinky  Digit Composite Extension WNL Trace movements  Digit Opposition WNL To index only  (Blank rows = not tested)  HAND FUNCTION: Grip strength: Right: 65 lbs; Left: unable to register (manual)  COORDINATION: Box and Blocks:   Left 2 blocks  SENSATION: Occasional paresthesias reported  MUSCLE TONE: LUE: Mild and Hypertonic ; uses Baclofen and heating pad  COGNITION: Overall cognitive status: Within functional limits for tasks assessed  PERCEPTION: Not tested  PRAXIS: Not tested  TODAY'S TREATMENT:                                                                                                                              - Self-care/home management completed for duration as noted below including: Objective measures  assessed as noted in Goals section to determine progression towards goals.  - Therapeutic activities completed for duration as noted below including: Pt completed 3, 1" cube pattern puzzles. This required pt to scan for appropriately colored blocks, recognize the appropriate color, and process where the block needed to be positioned while manipulating the blocks to place them according to the pattern. Pt requiring cues to keep R hand to side and not assist with task completion.  OT educated pt on tenodesis and encouraged to utilize these involuntary motor patterns to promote improved voluntary movements.  Discussed constraint induced movement therapy to discourage use of RUE and promote more functional use of LUE  as noted in pt instructions.  OT educated pt on left handed typing words as noted in pt instructions. Pt was encouraged to complete with computer off initially to just practice movements and to limit frustration I.e. if he is unable to physically type a word.   PATIENT EDUCATION: Education details: LUE functional Use Person educated: Patient Education method: Explanation, demonstrated Education comprehension: verbalized understanding, returned demonstration, requires further review/instruction  HOME EXERCISE PROGRAM: 11/10/2023: NMES placement and parameter tables  11/23/2023: Thumb extension NMES application 12/08/2023: L handed typing words, Constraint-induced Movement therapy  GOALS:  SHORT TERM GOALS: Target date: 12/01/2023  Patient will demonstrate independence with initial LUE HEP. Baseline: Goal status: IN PROGRESS  2.   Patient will demonstrate at least 15 lbs L grip strength as needed to open jars and other containers.  Baseline: unable to register force 12/08/2023: 8.3 lbs (goal revised) Goal status: IN PROGRESS   LONG TERM GOALS: Target date: 01/26/2024   Patient will demonstrate updated LUE HEP with 25% verbal cues or less for proper execution. Baseline:  Goal  status: INITIAL  2.  Pt will be able to place at least 7 blocks using left hand with completion of Box and Blocks test. Baseline: 2 blocks Goal status: IN PROGRESS  3.  Pt to demonstrate improved L thumb abduction AROM as needed to position business card between 1st and 2nd digits.  Baseline: unable to complete Goal status: IN PROGRESS  4.  Pt to demonstrate at least 50% L composite extension AROM as needed to grasp items in hand. Baseline: <25% Goal status: PROGRESS  ASSESSMENT:  CLINICAL IMPRESSION: Pt demonstrates improved L grip strength as needed to progress towards goals. Recommend pt utilize modified constraint induced therapy to promote additional progress.   PERFORMANCE DEFICITS in functional skills including ADLs, IADLs, coordination, dexterity, edema, tone, ROM, strength, Fine motor control, and UE functional use.   IMPAIRMENTS are limiting patient from ADLs, IADLs, and leisure.   COMORBIDITIES may have co-morbidities  that affects occupational performance. Patient will benefit from skilled OT to address above impairments and improve overall function.  REHAB POTENTIAL: Good  PLAN:  OT FREQUENCY: ADDITIONAL 1x/week  OT DURATION: ADDITIONAL  6 weeks  PLANNED INTERVENTIONS: self care/ADL training, therapeutic exercise, therapeutic activity, neuromuscular re-education, manual therapy, passive range of motion, splinting, electrical stimulation, ultrasound, moist heat, patient/family education, and Re-evaluation  RECOMMENDED OTHER SERVICES: N/A for this visit  CONSULTED AND AGREED WITH PLAN OF CARE: Patient  PLAN FOR NEXT SESSION: taping PRN; review use of NMES unit from home (specifically combo patterns), review HEP  How did typing go? List of functional activities, how is MC[facility]T  Delana Meyer, OT 12/08/2023, 12:18 PM

## 2023-12-15 ENCOUNTER — Ambulatory Visit: Payer: Managed Care, Other (non HMO) | Admitting: Occupational Therapy

## 2023-12-22 ENCOUNTER — Ambulatory Visit: Attending: Family Medicine | Admitting: Occupational Therapy

## 2023-12-22 DIAGNOSIS — R278 Other lack of coordination: Secondary | ICD-10-CM | POA: Insufficient documentation

## 2023-12-22 DIAGNOSIS — R208 Other disturbances of skin sensation: Secondary | ICD-10-CM | POA: Insufficient documentation

## 2023-12-22 DIAGNOSIS — M6281 Muscle weakness (generalized): Secondary | ICD-10-CM | POA: Diagnosis present

## 2023-12-22 DIAGNOSIS — R29818 Other symptoms and signs involving the nervous system: Secondary | ICD-10-CM | POA: Diagnosis present

## 2023-12-22 NOTE — Therapy (Signed)
 OUTPATIENT OCCUPATIONAL THERAPY NEURO TREATMENT   Patient Name: Vincent Ferrell MRN: [id number] DOB:05-Jun-1961, 63 y.o., male Today's Date: 12/22/2023  PCP: Toy Care, DO REFERRING PROVIDER: Toy Care, DO   OT End of Session - 12/22/23 1620     Visit Number 7    Number of Visits 13    Date for OT Re-Evaluation 01/26/24    Authorization Type [employer] - 90 OT, PT, ST VL combined    OT Start Time 1618    OT Stop Time 1702    OT Time Calculation (min) 44 min    Activity Tolerance Patient tolerated treatment well    Behavior During Therapy [facility] for tasks assessed/performed            Past Medical History:  Diagnosis Date   Chest pain in adult 10/24/2017   Elevated BP without diagnosis of hypertension 10/24/2017   GERD (gastroesophageal reflux disease)    Hypertension    Impaired fasting glucose 10/24/2017   Mixed hyperlipidemia 10/24/2017   Multifocal pneumonia 12/30/2021   Rising PSA level 10/24/2017   Stroke [facility])    Past Surgical History:  Procedure Laterality Date   COLONOSCOPY WITH PROPOFOL N/A 12/28/2021   Procedure: COLONOSCOPY WITH PROPOFOL;  Surgeon: Tressia Danas, MD;  Location: [facility] ENDOSCOPY;  Service: Gastroenterology;  Laterality: N/A;   COLONOSCOPY WITH PROPOFOL N/A 12/29/2021   Procedure: COLONOSCOPY WITH PROPOFOL;  Surgeon: Tressia Danas, MD;  Location: [facility] ENDOSCOPY;  Service: Gastroenterology;  Laterality: N/A;   ESOPHAGOGASTRODUODENOSCOPY (EGD) WITH PROPOFOL N/A 12/29/2021   Procedure: ESOPHAGOGASTRODUODENOSCOPY (EGD) WITH PROPOFOL;  Surgeon: Tressia Danas, MD;  Location: [facility] ENDOSCOPY;  Service: Gastroenterology;  Laterality: N/A;   ESOPHAGOGASTRODUODENOSCOPY (EGD) WITH PROPOFOL  01/05/2022   Procedure: ESOPHAGOGASTRODUODENOSCOPY (EGD) WITH PROPOFOL;  Surgeon: Diamantina Monks, MD;  Location: [facility] ENDOSCOPY;  Service: General;;   IR CT HEAD LTD  12/25/2021   IR PERCUTANEOUS ART THROMBECTOMY/INFUSION INTRACRANIAL INC DIAG ANGIO  12/25/2021    PEG PLACEMENT N/A 01/05/2022   Procedure: PERCUTANEOUS ENDOSCOPIC GASTROSTOMY (PEG) PLACEMENT;  Surgeon: Diamantina Monks, MD;  Location: [facility] ENDOSCOPY;  Service: General;  Laterality: N/A;   RADIOLOGY WITH ANESTHESIA N/A 12/25/2021   Procedure: IR WITH ANESTHESIA;  Surgeon: Radiologist, Medication, MD;  Location: [facility] OR;  Service: Radiology;  Laterality: N/A;   TRANSURETHRAL RESECTION OF PROSTATE N/A 12/29/2021   Procedure: TRANSURETHRAL RESECTION OF THE PROSTATE (TURP);  Surgeon: Crista Elliot, MD;  Location: [facility] OR;  Service: Urology;  Laterality: N/A;   Patient Active Problem List   Diagnosis Date Noted   Spastic hemiplegia affecting nondominant side (HCC) 07/29/2023   Encephalopathy acute 01/07/2022   Severe protein-calorie malnutrition (HCC) 01/07/2022   Physical deconditioning 01/07/2022   Diarrhea 01/07/2022   Pericardial effusion    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   Prostate abscess 12/30/2021   Acute kidney injury (HCC) 12/30/2021   Anemia due to GI blood loss    Acute respiratory failure with hypoxia (HCC)    Acute ischemic right MCA stroke (HCC) 12/25/2021   Sinus tachycardia    Chest pain in adult 10/24/2017   Mixed hyperlipidemia 10/24/2017   Rising PSA level 10/24/2017   Impaired fasting glucose 10/24/2017   Elevated BP without diagnosis of hypertension 10/24/2017    Rationale for Evaluation and Treatment: Rehabilitation  ONSET DATE: 10/21/2023 (date of referral)  REFERRING DIAG: G81.10 (ICD-10-CM) - Spastic hemiplegia affecting nondominant side   THERAPY DIAG:  Other symptoms and signs involving the nervous system  Other lack of coordination  Muscle weakness (generalized)  Other disturbances of skin sensation  SUBJECTIVE:   SUBJECTIVE STATEMENT: Pt reports he has been trying to work on closing his car door during modified constraint induced therapy. He does not endorse doing this for any period of time.   Pt accompanied by: self  PERTINENT  HISTORY: PMH: endocarditis, HTN, HLD  MRI from April, 2024 showed subacute ischemia in the right MCA, right occipital lobe, left frontal and parietal white matter and petechial blood in the left parietal lobe   PRECAUTIONS: None  WEIGHT BEARING RESTRICTIONS No  PAIN:  Are you having pain? Yes: NPRS scale: 1/10 Pain location: L hand Pain description: sore, stiff  FALLS: Has patient fallen in last 6 months? Yes. 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 GOALS Improve use of LUE  OBJECTIVE:   HAND DOMINANCE: Right  FUNCTIONAL OUTCOME MEASURES: Quick Dash: 18.2 % disability with use of LUE   UPPER EXTREMITY ROM:     AROM Right (eval) Left (eval)  Shoulder flexion WNL 90  Shoulder abduction WNL 70  Elbow flexion WNL 120  Elbow extension WNL WNL  Wrist flexion WNL 35  Wrist extension WNL 70  Wrist pronation WNL WNL  Wrist supination WNL 10   Digit Composite Flexion WNL Lacks 8 cm thumb, 2 cm ring finger, and 3 cm pinky  Digit Composite Extension WNL Trace movements  Digit Opposition WNL To index only  (Blank rows = not tested)  HAND FUNCTION: Grip strength: Right: 65 lbs; Left: unable to register (manual)  COORDINATION: Box and Blocks:   Left 2 blocks  SENSATION: Occasional paresthesias reported  MUSCLE TONE: LUE: Mild and Hypertonic ; uses Baclofen and heating pad  COGNITION: Overall cognitive status: Within functional limits for tasks assessed  PERCEPTION: Not tested  PRAXIS: Not tested  TODAY'S TREATMENT:                                                                                                                              OT educated pt on completion of functional activities with either incorporation of LUE or as part of modified  constraint induced therapy. Pt instructed on modified technique to tie shoes as well as manipulation of large pegs. Pt then provided with list of functional activities with sole use of LUE as noted in pt instructions. OT demonstrated various modifications for pt and cued pt to make sure he is breathing during task completion.   PATIENT EDUCATION: Education details: LUE functional Use Person educated: Patient Education method: Explanation, demonstrated, handout Education comprehension: verbalized understanding, returned demonstration, requires further review/instruction  HOME EXERCISE PROGRAM: 11/10/2023: NMES placement and parameter tables  11/23/2023: Thumb extension NMES application 12/08/2023: L handed typing words, Constraint-induced Movement therapy 12/22/2023: functional activity list  GOALS:  SHORT TERM GOALS: Target date: 12/01/2023  Patient will demonstrate independence with initial LUE HEP. Baseline: Goal status: IN PROGRESS  2.   Patient will demonstrate at least 15 lbs L grip strength as needed to open jars and other containers.  Baseline: unable to register force 12/08/2023: 8.3 lbs (goal revised) Goal status: IN PROGRESS   LONG TERM GOALS: Target date: 01/26/2024   Patient will demonstrate updated LUE HEP with 25% verbal cues or less for proper execution. Baseline:  Goal status: INITIAL  2.  Pt will be able to place at least 7 blocks using left hand with completion of Box and Blocks test. Baseline: 2 blocks Goal status: IN PROGRESS  3.  Pt to demonstrate improved L thumb abduction AROM as needed to position business card between 1st and 2nd digits.  Baseline: unable to complete Goal status: IN PROGRESS  4.  Pt to demonstrate at least 50% L composite extension AROM as needed to grasp items in hand. Baseline: <25% Goal status: PROGRESS  ASSESSMENT:  CLINICAL IMPRESSION: Pt requiring encouragement this date given frustration with task completion limitations. Given  involuntary use of RUE, pt was instructed to be more diligent with Modified Constraint Induced Therapy to promote more active movement of LUE.   PERFORMANCE DEFICITS in functional skills including ADLs, IADLs, coordination, dexterity, edema, tone, ROM, strength, Fine motor control, and UE functional use.   IMPAIRMENTS are limiting patient from ADLs, IADLs, and leisure.   COMORBIDITIES may have co-morbidities  that affects occupational performance. Patient will benefit from skilled OT to address above impairments and improve overall function.  REHAB POTENTIAL: Good  PLAN:  OT FREQUENCY: ADDITIONAL 1x/week  OT DURATION: ADDITIONAL  6 weeks  PLANNED INTERVENTIONS: self care/ADL training, therapeutic exercise, therapeutic activity, neuromuscular re-education, manual therapy, passive range of motion, splinting, electrical stimulation, ultrasound, moist heat, patient/family education, and Re-evaluation  RECOMMENDED OTHER SERVICES: N/A for this visit  CONSULTED AND AGREED WITH PLAN OF CARE: Patient  PLAN FOR NEXT SESSION: taping PRN; review use of NMES unit from home (specifically combo patterns), review HEP  Review second column of List of functional activities, how is MCIMT  Delana Meyer, OT 12/22/2023, 5:13 PM

## 2023-12-29 ENCOUNTER — Ambulatory Visit: Admitting: Occupational Therapy

## 2023-12-29 DIAGNOSIS — R29818 Other symptoms and signs involving the nervous system: Secondary | ICD-10-CM

## 2023-12-29 DIAGNOSIS — R208 Other disturbances of skin sensation: Secondary | ICD-10-CM

## 2023-12-29 DIAGNOSIS — M6281 Muscle weakness (generalized): Secondary | ICD-10-CM

## 2023-12-29 DIAGNOSIS — R278 Other lack of coordination: Secondary | ICD-10-CM

## 2023-12-29 NOTE — Therapy (Signed)
 OUTPATIENT OCCUPATIONAL THERAPY NEURO TREATMENT   Patient Name: Vincent Ferrell MRN: [id number] DOB:03-05-1961, 63 y.o., male Today's Date: 12/29/2023  PCP: Allean Aran, DO REFERRING PROVIDER: Genetta Kenning, MD   OT End of Session - 12/29/23 [phone]     Visit Number 8    Number of Visits 13    Date for OT Re-Evaluation 01/26/24    Authorization Type [employer] - 90 OT, PT, ST VL combined    OT Start Time 0935    OT Stop Time 1015    OT Time Calculation (min) 40 min    Activity Tolerance Patient tolerated treatment well    Behavior During Therapy [facility] for tasks assessed/performed            Past Medical History:  Diagnosis Date   Chest pain in adult 10/24/2017   Elevated BP without diagnosis of hypertension 10/24/2017   GERD (gastroesophageal reflux disease)    Hypertension    Impaired fasting glucose 10/24/2017   Mixed hyperlipidemia 10/24/2017   Multifocal pneumonia 12/30/2021   Rising PSA level 10/24/2017   Stroke [facility])    Past Surgical History:  Procedure Laterality Date   COLONOSCOPY WITH PROPOFOL  N/A 12/28/2021   Procedure: COLONOSCOPY WITH PROPOFOL ;  Surgeon: Lindle Rhea, MD;  Location: [facility] ENDOSCOPY;  Service: Gastroenterology;  Laterality: N/A;   COLONOSCOPY WITH PROPOFOL  N/A 12/29/2021   Procedure: COLONOSCOPY WITH PROPOFOL ;  Surgeon: Lindle Rhea, MD;  Location: [facility] ENDOSCOPY;  Service: Gastroenterology;  Laterality: N/A;   ESOPHAGOGASTRODUODENOSCOPY (EGD) WITH PROPOFOL  N/A 12/29/2021   Procedure: ESOPHAGOGASTRODUODENOSCOPY (EGD) WITH PROPOFOL ;  Surgeon: Lindle Rhea, MD;  Location: [facility] ENDOSCOPY;  Service: Gastroenterology;  Laterality: N/A;   ESOPHAGOGASTRODUODENOSCOPY (EGD) WITH PROPOFOL   01/05/2022   Procedure: ESOPHAGOGASTRODUODENOSCOPY (EGD) WITH PROPOFOL ;  Surgeon: Anda Bamberg, MD;  Location: [facility] ENDOSCOPY;  Service: General;;   IR CT HEAD LTD  12/25/2021   IR PERCUTANEOUS ART THROMBECTOMY/INFUSION INTRACRANIAL INC DIAG ANGIO  12/25/2021    PEG PLACEMENT N/A 01/05/2022   Procedure: PERCUTANEOUS ENDOSCOPIC GASTROSTOMY (PEG) PLACEMENT;  Surgeon: Anda Bamberg, MD;  Location: [facility] ENDOSCOPY;  Service: General;  Laterality: N/A;   RADIOLOGY WITH ANESTHESIA N/A 12/25/2021   Procedure: IR WITH ANESTHESIA;  Surgeon: Radiologist, Medication, MD;  Location: [facility] OR;  Service: Radiology;  Laterality: N/A;   TRANSURETHRAL RESECTION OF PROSTATE N/A 12/29/2021   Procedure: TRANSURETHRAL RESECTION OF THE PROSTATE (TURP);  Surgeon: Samson Croak, MD;  Location: [facility] OR;  Service: Urology;  Laterality: N/A;   Patient Active Problem List   Diagnosis Date Noted   Spastic hemiplegia affecting nondominant side (HCC) 07/29/2023   Encephalopathy acute 01/07/2022   Severe protein-calorie malnutrition (HCC) 01/07/2022   Physical deconditioning 01/07/2022   Diarrhea 01/07/2022   Pericardial effusion    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   Prostate abscess 12/30/2021   Acute kidney injury (HCC) 12/30/2021   Anemia due to GI blood loss    Acute respiratory failure with hypoxia (HCC)    Acute ischemic right MCA stroke (HCC) 12/25/2021   Sinus tachycardia    Chest pain in adult 10/24/2017   Mixed hyperlipidemia 10/24/2017   Rising PSA level 10/24/2017   Impaired fasting glucose 10/24/2017   Elevated BP without diagnosis of hypertension 10/24/2017    Rationale for Evaluation and Treatment: Rehabilitation  ONSET DATE: 10/21/2023 (date of referral)  REFERRING DIAG: G81.10 (ICD-10-CM) - Spastic hemiplegia affecting nondominant side   THERAPY DIAG:  Other symptoms and signs involving the nervous system  Other lack of coordination  Muscle weakness (generalized)  Other disturbances of skin sensation  SUBJECTIVE:   SUBJECTIVE STATEMENT: Pt reports he has been trying to complete functional activities from last session.  Pt accompanied by: self  PERTINENT HISTORY: PMH: endocarditis, HTN, HLD  MRI from April, 2024 showed subacute  ischemia in the right MCA, right occipital lobe, left frontal and parietal white matter and petechial blood in the left parietal lobe   PRECAUTIONS: None  WEIGHT BEARING RESTRICTIONS No  PAIN:  Are you having pain? Yes: NPRS scale: 1/10 Pain location: L hand Pain description: sore, stiff  FALLS: Has patient fallen in last 6 months? Yes. 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 GOALS Improve use of LUE  OBJECTIVE:   HAND DOMINANCE: Right  FUNCTIONAL OUTCOME MEASURES: Quick Dash: 18.2 % disability with use of LUE   UPPER EXTREMITY ROM:     AROM Right (eval) Left (eval)  Shoulder flexion WNL 90  Shoulder abduction WNL 70  Elbow flexion WNL 120  Elbow extension WNL WNL  Wrist flexion WNL 35  Wrist extension WNL 70  Wrist pronation WNL WNL  Wrist supination WNL 10   Digit Composite Flexion WNL Lacks 8 cm thumb, 2 cm ring finger, and 3 cm pinky  Digit Composite Extension WNL Trace movements  Digit Opposition WNL To index only  (Blank rows = not tested)  HAND FUNCTION: Grip strength: Right: 65 lbs; Left: unable to register (manual)  COORDINATION: Box and Blocks:   Left 2 blocks  SENSATION: Occasional paresthesias reported  MUSCLE TONE: LUE: Mild and Hypertonic ; uses Baclofen and heating pad  COGNITION: Overall cognitive status: Within functional limits for tasks assessed  PERCEPTION: Not tested  PRAXIS: Not tested  TODAY'S TREATMENT:                                                                                                                              OT educated pt on completion of functional activities with either incorporation of LUE or as part of modified constraint induced therapy. Further reviewed list of functional activities with sole  use of LUE as noted in pt instructions. OT demonstrated various modifications for pt.   PATIENT EDUCATION: Education details: LUE functional Use Person educated: Patient Education method: Explanation, demonstrated, handout Education comprehension: verbalized understanding, returned demonstration, requires further review/instruction  HOME EXERCISE PROGRAM: 11/10/2023: NMES placement and parameter tables  11/23/2023: Thumb extension NMES application 12/08/2023: L handed typing words, Constraint-induced Movement therapy 12/22/2023: functional activity list  GOALS:  SHORT TERM GOALS: Target date: 12/01/2023  Patient will demonstrate independence with initial LUE HEP. Baseline: Goal status: IN PROGRESS  2.   Patient will demonstrate at least 15 lbs L grip strength as needed to open jars and other containers.  Baseline: unable to  register force 12/08/2023: 8.3 lbs (goal revised) Goal status: IN PROGRESS   LONG TERM GOALS: Target date: 01/26/2024   Patient will demonstrate updated LUE HEP with 25% verbal cues or less for proper execution. Baseline:  Goal status: INITIAL  2.  Pt will be able to place at least 7 blocks using left hand with completion of Box and Blocks test. Baseline: 2 blocks Goal status: IN PROGRESS  3.  Pt to demonstrate improved L thumb abduction AROM as needed to position business card between 1st and 2nd digits.  Baseline: unable to complete Goal status: IN PROGRESS  4.  Pt to demonstrate at least 50% L composite extension AROM as needed to grasp items in hand. Baseline: <25% Goal status: PROGRESS  ASSESSMENT:  CLINICAL IMPRESSION: Pt requiring encouragement this date given frustration with task completion limitations. Given involuntary use of RUE, pt was instructed to be more diligent with Modified Constraint Induced Therapy to promote more active movement of LUE. Will likely require at least 1 additional review of list to promote carryover.   PERFORMANCE  DEFICITS in functional skills including ADLs, IADLs, coordination, dexterity, edema, tone, ROM, strength, Fine motor control, and UE functional use.   IMPAIRMENTS are limiting patient from ADLs, IADLs, and leisure.   COMORBIDITIES may have co-morbidities  that affects occupational performance. Patient will benefit from skilled OT to address above impairments and improve overall function.  REHAB POTENTIAL: Good  PLAN:  OT FREQUENCY: ADDITIONAL 1x/week  OT DURATION: ADDITIONAL  6 weeks  PLANNED INTERVENTIONS: self care/ADL training, therapeutic exercise, therapeutic activity, neuromuscular re-education, manual therapy, passive range of motion, splinting, electrical stimulation, ultrasound, moist heat, patient/family education, and Re-evaluation  RECOMMENDED OTHER SERVICES: N/A for this visit  CONSULTED AND AGREED WITH PLAN OF CARE: Patient  PLAN FOR NEXT SESSION: taping PRN; review use of NMES unit from home (specifically combo patterns), review HEP  Prone activities  Additional review List of functional activities, how is [facility]  Altamease Asters, OT 12/29/2023, 5:50 PM

## 2024-01-04 NOTE — Therapy (Unsigned)
 OUTPATIENT OCCUPATIONAL THERAPY NEURO TREATMENT   Patient Name: Vincent Ferrell MRN: [id number] DOB:Jul 04, 1961, 63 y.o., male Today's Date: 01/05/2024  PCP: Allean Aran, DO REFERRING PROVIDER: Genetta Kenning, MD   OT End of Session - 01/05/24 [phone]     Visit Number 9    Number of Visits 13    Date for OT Re-Evaluation 01/26/24    Authorization Type [employer] - 90 OT, PT, ST VL combined    OT Start Time 0936    OT Stop Time 1015    OT Time Calculation (min) 39 min    Activity Tolerance Patient tolerated treatment well    Behavior During Therapy [facility] for tasks assessed/performed             Past Medical History:  Diagnosis Date   Chest pain in adult 10/24/2017   Elevated BP without diagnosis of hypertension 10/24/2017   GERD (gastroesophageal reflux disease)    Hypertension    Impaired fasting glucose 10/24/2017   Mixed hyperlipidemia 10/24/2017   Multifocal pneumonia 12/30/2021   Rising PSA level 10/24/2017   Stroke [facility])    Past Surgical History:  Procedure Laterality Date   COLONOSCOPY WITH PROPOFOL  N/A 12/28/2021   Procedure: COLONOSCOPY WITH PROPOFOL ;  Surgeon: Lindle Rhea, MD;  Location: [facility] ENDOSCOPY;  Service: Gastroenterology;  Laterality: N/A;   COLONOSCOPY WITH PROPOFOL  N/A 12/29/2021   Procedure: COLONOSCOPY WITH PROPOFOL ;  Surgeon: Lindle Rhea, MD;  Location: [facility] ENDOSCOPY;  Service: Gastroenterology;  Laterality: N/A;   ESOPHAGOGASTRODUODENOSCOPY (EGD) WITH PROPOFOL  N/A 12/29/2021   Procedure: ESOPHAGOGASTRODUODENOSCOPY (EGD) WITH PROPOFOL ;  Surgeon: Lindle Rhea, MD;  Location: [facility] ENDOSCOPY;  Service: Gastroenterology;  Laterality: N/A;   ESOPHAGOGASTRODUODENOSCOPY (EGD) WITH PROPOFOL   01/05/2022   Procedure: ESOPHAGOGASTRODUODENOSCOPY (EGD) WITH PROPOFOL ;  Surgeon: Anda Bamberg, MD;  Location: [facility] ENDOSCOPY;  Service: General;;   IR CT HEAD LTD  12/25/2021   IR PERCUTANEOUS ART THROMBECTOMY/INFUSION INTRACRANIAL INC DIAG ANGIO   12/25/2021   PEG PLACEMENT N/A 01/05/2022   Procedure: PERCUTANEOUS ENDOSCOPIC GASTROSTOMY (PEG) PLACEMENT;  Surgeon: Anda Bamberg, MD;  Location: [facility] ENDOSCOPY;  Service: General;  Laterality: N/A;   RADIOLOGY WITH ANESTHESIA N/A 12/25/2021   Procedure: IR WITH ANESTHESIA;  Surgeon: Radiologist, Medication, MD;  Location: [facility] OR;  Service: Radiology;  Laterality: N/A;   TRANSURETHRAL RESECTION OF PROSTATE N/A 12/29/2021   Procedure: TRANSURETHRAL RESECTION OF THE PROSTATE (TURP);  Surgeon: Samson Croak, MD;  Location: [facility] OR;  Service: Urology;  Laterality: N/A;   Patient Active Problem List   Diagnosis Date Noted   Spastic hemiplegia affecting nondominant side (HCC) 07/29/2023   Encephalopathy acute 01/07/2022   Severe protein-calorie malnutrition (HCC) 01/07/2022   Physical deconditioning 01/07/2022   Diarrhea 01/07/2022   Pericardial effusion    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   Prostate abscess 12/30/2021   Acute kidney injury (HCC) 12/30/2021   Anemia due to GI blood loss    Acute respiratory failure with hypoxia (HCC)    Acute ischemic right MCA stroke (HCC) 12/25/2021   Sinus tachycardia    Chest pain in adult 10/24/2017   Mixed hyperlipidemia 10/24/2017   Rising PSA level 10/24/2017   Impaired fasting glucose 10/24/2017   Elevated BP without diagnosis of hypertension 10/24/2017    Rationale for Evaluation and Treatment: Rehabilitation  ONSET DATE: 10/21/2023 (date of referral)  REFERRING DIAG: G81.10 (ICD-10-CM) - Spastic hemiplegia affecting nondominant side   THERAPY DIAG:  No diagnosis found.  SUBJECTIVE:   SUBJECTIVE STATEMENT: Pt reports he  has been trying to complete functional activities from last session.  Pt accompanied by: self  PERTINENT HISTORY: PMH: endocarditis, HTN, HLD  MRI from April, 2024 showed subacute ischemia in the right MCA, right occipital lobe, left frontal and parietal white matter and petechial blood in the left parietal  lobe   PRECAUTIONS: None  WEIGHT BEARING RESTRICTIONS No  PAIN:  Are you having pain? Yes: NPRS scale: 1/10 Pain location: L hand Pain description: sore, stiff  FALLS: Has patient fallen in last 6 months? Yes. 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 GOALS Improve use of LUE  OBJECTIVE:   HAND DOMINANCE: Right  FUNCTIONAL OUTCOME MEASURES: Quick Dash: 18.2 % disability with use of LUE   UPPER EXTREMITY ROM:     AROM Right (eval) Left (eval)  Shoulder flexion WNL 90  Shoulder abduction WNL 70  Elbow flexion WNL 120  Elbow extension WNL WNL  Wrist flexion WNL 35  Wrist extension WNL 70  Wrist pronation WNL WNL  Wrist supination WNL 10   Digit Composite Flexion WNL Lacks 8 cm thumb, 2 cm ring finger, and 3 cm pinky  Digit Composite Extension WNL Trace movements  Digit Opposition WNL To index only  (Blank rows = not tested)  HAND FUNCTION: Grip strength: Right: 65 lbs; Left: unable to register (manual)  COORDINATION: Box and Blocks:   Left 2 blocks  SENSATION: Occasional paresthesias reported  MUSCLE TONE: LUE: Mild and Hypertonic ; uses Baclofen and heating pad  COGNITION: Overall cognitive status: Within functional limits for tasks assessed  PERCEPTION: Not tested  PRAXIS: Not tested  TODAY'S TREATMENT:                                                                                                                              OT reivewed NMES L wrist and digit extension application using pt's home unit. Pt requiring mod cueing for proper completion.  PATIENT EDUCATION: Education details: LUE functional Use Person educated: Patient Education method: Explanation, demonstrated, handout Education comprehension: verbalized understanding,  returned demonstration, requires further review/instruction  HOME EXERCISE PROGRAM: 11/10/2023: NMES placement and parameter tables  11/23/2023: Thumb extension NMES application 12/08/2023: L handed typing words, Constraint-induced Movement therapy 12/22/2023: functional activity list  GOALS:  SHORT TERM GOALS: Target date: 12/01/2023  Patient will demonstrate independence with initial LUE HEP. Baseline: Goal status: IN PROGRESS  2.   Patient will demonstrate at least 15 lbs L grip strength as needed to open jars and other containers.  Baseline: unable to register force 12/08/2023: 8.3 lbs (goal revised) Goal status: IN PROGRESS   LONG TERM GOALS: Target date: 01/26/2024   Patient will demonstrate updated LUE HEP with 25% verbal cues or less for proper execution. Baseline:  Goal status: INITIAL  2.  Pt will be able to place at least 7 blocks using left hand with completion of Box and Blocks test. Baseline: 2 blocks Goal status: IN PROGRESS  3.  Pt to demonstrate improved L thumb abduction AROM as needed to position business card between 1st and 2nd digits.  Baseline: unable to complete Goal status: IN PROGRESS  4.  Pt to demonstrate at least 50% L composite extension AROM as needed to grasp items in hand. Baseline: <25% Goal status: PROGRESS  ASSESSMENT:  CLINICAL IMPRESSION: Pt requiring encouragement this date given frustration with task completion limitations. Given involuntary use of RUE, pt was instructed to be more diligent with Modified Constraint Induced Therapy to promote more active movement of LUE. Will likely require at least 1 additional review of list to promote carryover.   PERFORMANCE DEFICITS in functional skills including ADLs, IADLs, coordination, dexterity, edema, tone, ROM, strength, Fine motor control, and UE functional use.   IMPAIRMENTS are limiting patient from ADLs, IADLs, and leisure.   COMORBIDITIES may have co-morbidities  that affects  occupational performance. Patient will benefit from skilled OT to address above impairments and improve overall function.  REHAB POTENTIAL: Good  PLAN:  OT FREQUENCY: ADDITIONAL 1x/week  OT DURATION: ADDITIONAL  6 weeks  PLANNED INTERVENTIONS: self care/ADL training, therapeutic exercise, therapeutic activity, neuromuscular re-education, manual therapy, passive range of motion, splinting, electrical stimulation, ultrasound, moist heat, patient/family education, and Re-evaluation  RECOMMENDED OTHER SERVICES: N/A for this visit  CONSULTED AND AGREED WITH PLAN OF CARE: Patient  PLAN FOR NEXT SESSION: taping PRN; review use of NMES unit from home (specifically combo patterns), review HEP  Prone activities  Additional review List of functional activities, how is [facility]  Altamease Asters, OT 01/05/2024, 10:15 AM

## 2024-01-05 ENCOUNTER — Ambulatory Visit: Admitting: Occupational Therapy

## 2024-01-05 DIAGNOSIS — R29818 Other symptoms and signs involving the nervous system: Secondary | ICD-10-CM | POA: Diagnosis not present

## 2024-01-05 DIAGNOSIS — M6281 Muscle weakness (generalized): Secondary | ICD-10-CM

## 2024-01-05 DIAGNOSIS — R208 Other disturbances of skin sensation: Secondary | ICD-10-CM

## 2024-01-05 DIAGNOSIS — R278 Other lack of coordination: Secondary | ICD-10-CM

## 2024-01-12 ENCOUNTER — Ambulatory Visit: Attending: Family Medicine | Admitting: Occupational Therapy

## 2024-01-12 DIAGNOSIS — R208 Other disturbances of skin sensation: Secondary | ICD-10-CM

## 2024-01-12 DIAGNOSIS — R278 Other lack of coordination: Secondary | ICD-10-CM | POA: Diagnosis present

## 2024-01-12 DIAGNOSIS — M6281 Muscle weakness (generalized): Secondary | ICD-10-CM | POA: Diagnosis present

## 2024-01-12 DIAGNOSIS — R29818 Other symptoms and signs involving the nervous system: Secondary | ICD-10-CM | POA: Diagnosis present

## 2024-01-12 NOTE — Therapy (Signed)
 OUTPATIENT OCCUPATIONAL THERAPY NEURO TREATMENT   Patient Name: Vincent Ferrell MRN: [id number] DOB:1961/06/16, 63 y.o., male Today's Date: 01/12/2024  PCP: Allean Aran, DO REFERRING PROVIDER: Genetta Kenning, MD   OT End of Session - 01/12/24 [phone]     Visit Number 10    Number of Visits 13    Date for OT Re-Evaluation 01/26/24    Authorization Type [employer] - 90 OT, PT, ST VL combined    OT Start Time 0935    OT Stop Time 1016    OT Time Calculation (min) 41 min    Activity Tolerance Patient tolerated treatment well    Behavior During Therapy [facility] for tasks assessed/performed             Past Medical History:  Diagnosis Date   Chest pain in adult 10/24/2017   Elevated BP without diagnosis of hypertension 10/24/2017   GERD (gastroesophageal reflux disease)    Hypertension    Impaired fasting glucose 10/24/2017   Mixed hyperlipidemia 10/24/2017   Multifocal pneumonia 12/30/2021   Rising PSA level 10/24/2017   Stroke [facility])    Past Surgical History:  Procedure Laterality Date   COLONOSCOPY WITH PROPOFOL  N/A 12/28/2021   Procedure: COLONOSCOPY WITH PROPOFOL ;  Surgeon: Lindle Rhea, MD;  Location: [facility] ENDOSCOPY;  Service: Gastroenterology;  Laterality: N/A;   COLONOSCOPY WITH PROPOFOL  N/A 12/29/2021   Procedure: COLONOSCOPY WITH PROPOFOL ;  Surgeon: Lindle Rhea, MD;  Location: [facility] ENDOSCOPY;  Service: Gastroenterology;  Laterality: N/A;   ESOPHAGOGASTRODUODENOSCOPY (EGD) WITH PROPOFOL  N/A 12/29/2021   Procedure: ESOPHAGOGASTRODUODENOSCOPY (EGD) WITH PROPOFOL ;  Surgeon: Lindle Rhea, MD;  Location: [facility] ENDOSCOPY;  Service: Gastroenterology;  Laterality: N/A;   ESOPHAGOGASTRODUODENOSCOPY (EGD) WITH PROPOFOL   01/05/2022   Procedure: ESOPHAGOGASTRODUODENOSCOPY (EGD) WITH PROPOFOL ;  Surgeon: Anda Bamberg, MD;  Location: [facility] ENDOSCOPY;  Service: General;;   IR CT HEAD LTD  12/25/2021   IR PERCUTANEOUS ART THROMBECTOMY/INFUSION INTRACRANIAL INC DIAG ANGIO   12/25/2021   PEG PLACEMENT N/A 01/05/2022   Procedure: PERCUTANEOUS ENDOSCOPIC GASTROSTOMY (PEG) PLACEMENT;  Surgeon: Anda Bamberg, MD;  Location: [facility] ENDOSCOPY;  Service: General;  Laterality: N/A;   RADIOLOGY WITH ANESTHESIA N/A 12/25/2021   Procedure: IR WITH ANESTHESIA;  Surgeon: Radiologist, Medication, MD;  Location: [facility] OR;  Service: Radiology;  Laterality: N/A;   TRANSURETHRAL RESECTION OF PROSTATE N/A 12/29/2021   Procedure: TRANSURETHRAL RESECTION OF THE PROSTATE (TURP);  Surgeon: Samson Croak, MD;  Location: [facility] OR;  Service: Urology;  Laterality: N/A;   Patient Active Problem List   Diagnosis Date Noted   Spastic hemiplegia affecting nondominant side (HCC) 07/29/2023   Encephalopathy acute 01/07/2022   Severe protein-calorie malnutrition (HCC) 01/07/2022   Physical deconditioning 01/07/2022   Diarrhea 01/07/2022   Pericardial effusion    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   Prostate abscess 12/30/2021   Acute kidney injury (HCC) 12/30/2021   Anemia due to GI blood loss    Acute respiratory failure with hypoxia (HCC)    Acute ischemic right MCA stroke (HCC) 12/25/2021   Sinus tachycardia    Chest pain in adult 10/24/2017   Mixed hyperlipidemia 10/24/2017   Rising PSA level 10/24/2017   Impaired fasting glucose 10/24/2017   Elevated BP without diagnosis of hypertension 10/24/2017    Rationale for Evaluation and Treatment: Rehabilitation  ONSET DATE: 10/21/2023 (date of referral)  REFERRING DIAG: G81.10 (ICD-10-CM) - Spastic hemiplegia affecting nondominant side   THERAPY DIAG:  Other symptoms and signs involving the nervous system  Other lack of  coordination  Muscle weakness (generalized)  Other disturbances of skin sensation  SUBJECTIVE:   SUBJECTIVE STATEMENT: Pt reports he has not replaced battery on NMES unit.  Pt accompanied by: self  PERTINENT HISTORY: PMH: endocarditis, HTN, HLD  MRI from April, 2024 showed subacute ischemia in the right  MCA, right occipital lobe, left frontal and parietal white matter and petechial blood in the left parietal lobe   PRECAUTIONS: None  WEIGHT BEARING RESTRICTIONS No  PAIN:  Are you having pain? Yes: NPRS scale: 1/10 Pain location: L hand Pain description: sore, stiff  FALLS: Has patient fallen in last 6 months? Yes. 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 GOALS Improve use of LUE  OBJECTIVE:   HAND DOMINANCE: Right  FUNCTIONAL OUTCOME MEASURES: Quick Dash: 18.2 % disability with use of LUE   UPPER EXTREMITY ROM:     AROM Right (eval) Left (eval)  Shoulder flexion WNL 90  Shoulder abduction WNL 70  Elbow flexion WNL 120  Elbow extension WNL WNL  Wrist flexion WNL 35  Wrist extension WNL 70  Wrist pronation WNL WNL  Wrist supination WNL 10   Digit Composite Flexion WNL Lacks 8 cm thumb, 2 cm ring finger, and 3 cm pinky  Digit Composite Extension WNL Trace movements  Digit Opposition WNL To index only  (Blank rows = not tested)  HAND FUNCTION: Grip strength: Right: 65 lbs; Left: unable to register (manual)  COORDINATION: Box and Blocks:   Left 2 blocks  SENSATION: Occasional paresthesias reported  MUSCLE TONE: LUE: Mild and Hypertonic ; uses Baclofen and heating pad  COGNITION: Overall cognitive status: Within functional limits for tasks assessed  PERCEPTION: Not tested  PRAXIS: Not tested  TODAY'S TREATMENT:                                                                                                                              OT reivewed NMES L wrist and digit extension application using pt's home unit. Pt requiring min cueing for proper application.  Educated pt on alternating application to go from L digit extension to flexion to  promote grasp assumption and release of items. Pt was instructed to grasp washcloth for additional sensory feedback.  OT educated pt on L thumb applications for opposition and abduction as noted in pt instructions. Good tolerance to applications though limited muscle activation.   PATIENT EDUCATION: Education details: LUE functional Use; NMES use Person educated: Patient Education method: Explanation, demonstrated Education comprehension: verbalized understanding, returned demonstration, requires further review/instruction  HOME EXERCISE PROGRAM: 11/10/2023: NMES placement and parameter tables  11/23/2023: Thumb extension NMES application 12/08/2023: L handed typing words, Constraint-induced Movement therapy 12/22/2023: functional activity list  GOALS:  SHORT TERM GOALS: Target date: 12/01/2023  Patient will demonstrate independence with  initial LUE HEP. Baseline: Goal status: IN PROGRESS  2.   Patient will demonstrate at least 15 lbs L grip strength as needed to open jars and other containers.  Baseline: unable to register force 12/08/2023: 8.3 lbs (goal revised) Goal status: IN PROGRESS   LONG TERM GOALS: Target date: 01/26/2024   Patient will demonstrate updated LUE HEP with 25% verbal cues or less for proper execution. Baseline:  Goal status: INITIAL  2.  Pt will be able to place at least 7 blocks using left hand with completion of Box and Blocks test. Baseline: 2 blocks Goal status: IN PROGRESS  3.  Pt to demonstrate improved L thumb abduction AROM as needed to position business card between 1st and 2nd digits.  Baseline: unable to complete Goal status: IN PROGRESS  4.  Pt to demonstrate at least 50% L composite extension AROM as needed to grasp items in hand. Baseline: <25% Goal status: PROGRESS  ASSESSMENT:  CLINICAL IMPRESSION: Pt demonstrating better recall with NMES use. Demonstrates good understanding of thumb applications for opposition and abduction.    PERFORMANCE DEFICITS in functional skills including ADLs, IADLs, coordination, dexterity, edema, tone, ROM, strength, Fine motor control, and UE functional use.   IMPAIRMENTS are limiting patient from ADLs, IADLs, and leisure.   COMORBIDITIES may have co-morbidities  that affects occupational performance. Patient will benefit from skilled OT to address above impairments and improve overall function.  REHAB POTENTIAL: Good  PLAN:  OT FREQUENCY: ADDITIONAL 1x/week  OT DURATION: ADDITIONAL  6 weeks  PLANNED INTERVENTIONS: self care/ADL training, therapeutic exercise, therapeutic activity, neuromuscular re-education, manual therapy, passive range of motion, splinting, electrical stimulation, ultrasound, moist heat, patient/family education, and Re-evaluation  RECOMMENDED OTHER SERVICES: N/A for this visit  CONSULTED AND AGREED WITH PLAN OF CARE: Patient  PLAN FOR NEXT SESSION: taping PRN; review use of NMES PRN; review HEP  Prone activities  Additional review List of functional activities, how is [facility]  Altamease Asters, OT 01/12/2024, 11:17 AM

## 2024-01-13 ENCOUNTER — Encounter: Payer: Self-pay | Admitting: Physical Medicine & Rehabilitation

## 2024-01-13 ENCOUNTER — Encounter: Attending: Physical Medicine & Rehabilitation | Admitting: Physical Medicine & Rehabilitation

## 2024-01-13 VITALS — BP 110/76 | HR 59 | Temp 98.3°F | Wt 217.2 lb

## 2024-01-13 DIAGNOSIS — G8110 Spastic hemiplegia affecting unspecified side: Secondary | ICD-10-CM | POA: Diagnosis not present

## 2024-01-13 MED ORDER — ONABOTULINUMTOXINA 100 UNITS IJ SOLR
200.0000 [IU] | Freq: Once | INTRAMUSCULAR | Status: AC
  Administered 2024-01-13: 200 [IU] via INTRAMUSCULAR

## 2024-01-13 NOTE — Patient Instructions (Signed)

## 2024-01-13 NOTE — Progress Notes (Signed)
 Botox  Injection for spasticity using needle EMG guidance  Dilution: 50 Units/ml Indication: Severe spasticity which interferes with ADL,mobility and/or  hygiene and is unresponsive to medication management and other conservative care Informed consent was obtained after describing risks and benefits of the procedure with the patient. This includes bleeding, bruising, infection, excessive weakness, or medication side effects. A REMS form is on file and signed. Needle: 27g 1" needle electrode Number of units per muscle Left   FDS 50 FDP 50 PT 50 FCR 50 All injections were done after obtaining appropriate EMG activity and after negative drawback for blood. The patient tolerated the procedure well. Post procedure instructions were given. A followup appointment was made.

## 2024-01-13 NOTE — Addendum Note (Signed)
 Addended by: Candis Chamberlain on: 01/13/2024 02:21 PM   Modules accepted: Orders

## 2024-01-19 ENCOUNTER — Ambulatory Visit: Admitting: Occupational Therapy

## 2024-01-19 DIAGNOSIS — R208 Other disturbances of skin sensation: Secondary | ICD-10-CM

## 2024-01-19 DIAGNOSIS — R278 Other lack of coordination: Secondary | ICD-10-CM

## 2024-01-19 DIAGNOSIS — M6281 Muscle weakness (generalized): Secondary | ICD-10-CM

## 2024-01-19 DIAGNOSIS — R29818 Other symptoms and signs involving the nervous system: Secondary | ICD-10-CM | POA: Diagnosis not present

## 2024-01-19 NOTE — Therapy (Signed)
 OUTPATIENT OCCUPATIONAL THERAPY NEURO TREATMENT   Patient Name: Sherley Vivo MRN: [id number] DOB:[birth year], 63 y.o., male Today's Date: 01/19/2024  PCP: Allean Aran, DO REFERRING PROVIDER: Genetta Kenning, MD   OT End of Session - 01/19/24 0940     Visit Number 11    Number of Visits 13    Date for OT Re-Evaluation 01/26/24    Authorization Type [employer] - 90 OT, PT, ST VL combined    OT Start Time 0938    OT Stop Time 1017    OT Time Calculation (min) 39 min    Activity Tolerance Patient tolerated treatment well    Behavior During Therapy [facility] for tasks assessed/performed            Past Medical History:  Diagnosis Date   Chest pain in adult 10/24/2017   Elevated BP without diagnosis of hypertension 10/24/2017   GERD (gastroesophageal reflux disease)    Hypertension    Impaired fasting glucose 10/24/2017   Mixed hyperlipidemia 10/24/2017   Multifocal pneumonia 12/30/2021   Rising PSA level 10/24/2017   Stroke [facility])    Past Surgical History:  Procedure Laterality Date   COLONOSCOPY WITH PROPOFOL  N/A 12/28/2021   Procedure: COLONOSCOPY WITH PROPOFOL ;  Surgeon: Lindle Rhea, MD;  Location: [facility] ENDOSCOPY;  Service: Gastroenterology;  Laterality: N/A;   COLONOSCOPY WITH PROPOFOL  N/A 12/29/2021   Procedure: COLONOSCOPY WITH PROPOFOL ;  Surgeon: Lindle Rhea, MD;  Location: [facility] ENDOSCOPY;  Service: Gastroenterology;  Laterality: N/A;   ESOPHAGOGASTRODUODENOSCOPY (EGD) WITH PROPOFOL  N/A 12/29/2021   Procedure: ESOPHAGOGASTRODUODENOSCOPY (EGD) WITH PROPOFOL ;  Surgeon: Lindle Rhea, MD;  Location: [facility] ENDOSCOPY;  Service: Gastroenterology;  Laterality: N/A;   ESOPHAGOGASTRODUODENOSCOPY (EGD) WITH PROPOFOL   01/05/2022   Procedure: ESOPHAGOGASTRODUODENOSCOPY (EGD) WITH PROPOFOL ;  Surgeon: Anda Bamberg, MD;  Location: [facility] ENDOSCOPY;  Service: General;;   IR CT HEAD LTD  12/25/2021   IR PERCUTANEOUS ART THROMBECTOMY/INFUSION INTRACRANIAL INC DIAG ANGIO  12/25/2021    PEG PLACEMENT N/A 01/05/2022   Procedure: PERCUTANEOUS ENDOSCOPIC GASTROSTOMY (PEG) PLACEMENT;  Surgeon: Anda Bamberg, MD;  Location: [facility] ENDOSCOPY;  Service: General;  Laterality: N/A;   RADIOLOGY WITH ANESTHESIA N/A 12/25/2021   Procedure: IR WITH ANESTHESIA;  Surgeon: Radiologist, Medication, MD;  Location: [facility] OR;  Service: Radiology;  Laterality: N/A;   TRANSURETHRAL RESECTION OF PROSTATE N/A 12/29/2021   Procedure: TRANSURETHRAL RESECTION OF THE PROSTATE (TURP);  Surgeon: Samson Croak, MD;  Location: [facility] OR;  Service: Urology;  Laterality: N/A;   Patient Active Problem List   Diagnosis Date Noted   Spastic hemiplegia affecting nondominant side (HCC) 07/29/2023   Encephalopathy acute 01/07/2022   Severe protein-calorie malnutrition (HCC) 01/07/2022   Physical deconditioning 01/07/2022   Diarrhea 01/07/2022   Pericardial effusion    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   Prostate abscess 12/30/2021   Acute kidney injury (HCC) 12/30/2021   Anemia due to GI blood loss    Acute respiratory failure with hypoxia (HCC)    Acute ischemic right MCA stroke (HCC) 12/25/2021   Sinus tachycardia    Chest pain in adult 10/24/2017   Mixed hyperlipidemia 10/24/2017   Rising PSA level 10/24/2017   Impaired fasting glucose 10/24/2017   Elevated BP without diagnosis of hypertension 10/24/2017    Rationale for Evaluation and Treatment: Rehabilitation  ONSET DATE: 10/21/2023 (date of referral)  REFERRING DIAG: G81.10 (ICD-10-CM) - Spastic hemiplegia affecting nondominant side   THERAPY DIAG:  Other symptoms and signs involving the nervous system  Other lack of coordination  Muscle weakness (generalized)  Other disturbances of skin sensation  SUBJECTIVE:   SUBJECTIVE STATEMENT: Pt reports he had Botox  and is noticing some changes.   Pt accompanied by: self  PERTINENT HISTORY: PMH: endocarditis, HTN, HLD  MRI from April, 2024 showed subacute ischemia in the right MCA,  right occipital lobe, left frontal and parietal white matter and petechial blood in the left parietal lobe   PRECAUTIONS: None  WEIGHT BEARING RESTRICTIONS No  PAIN:  Are you having pain? Yes: NPRS scale: 1/10 Pain location: L hand Pain description: sore, stiff  FALLS: Has patient fallen in last 6 months? Yes. 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 GOALS Improve use of LUE  OBJECTIVE:   HAND DOMINANCE: Right  FUNCTIONAL OUTCOME MEASURES: Quick Dash: 18.2 % disability with use of LUE   UPPER EXTREMITY ROM:     AROM Right (eval) Left (eval)  Shoulder flexion WNL 90  Shoulder abduction WNL 70  Elbow flexion WNL 120  Elbow extension WNL WNL  Wrist flexion WNL 35  Wrist extension WNL 70  Wrist pronation WNL WNL  Wrist supination WNL 10   Digit Composite Flexion WNL Lacks 8 cm thumb, 2 cm ring finger, and 3 cm pinky  Digit Composite Extension WNL Trace movements  Digit Opposition WNL To index only  (Blank rows = not tested)  HAND FUNCTION: Grip strength: Right: 65 lbs; Left: unable to register (manual)  COORDINATION: Box and Blocks:   Left 2 blocks  SENSATION: Occasional paresthesias reported  MUSCLE TONE: LUE: Mild and Hypertonic ; uses Baclofen and heating pad  COGNITION: Overall cognitive status: Within functional limits for tasks assessed  PERCEPTION: Not tested  PRAXIS: Not tested  TODAY'S TREATMENT:                                                                                                                               Access Code: [email or web address] URL: [email or web address] Date: 01/19/2024 Prepared by: Charissa Compton  Exercises - Baby Cobra Hands Down  - 1 x daily - 5 reps - 5-10 second hold - Supine Bridge  - 1 x daily -  5 reps - 5-10 second hold - Quadruped Arm Raise  - 1 x daily - 5 reps - 5-10 second hold - Quadruped Rocking Backward  - 1 x daily - 5 reps - 5-10 second hold - Quadruped Thoracic Spine Extension  - 1 x daily - 5 reps - 5-10 second hold - Supine Scapular Retraction  - 1 x daily - 5 reps - 5-10 second hold - Sleeper Stretch  - 1 x daily - 5 reps - 5-10 second hold  PATIENT EDUCATION: Education details: LUE HEP Person educated: Patient Education method: Explanation, demonstrated Education comprehension: verbalized understanding, returned demonstration, requires further  review/instruction  HOME EXERCISE PROGRAM: 11/10/2023: NMES placement and parameter tables  11/23/2023: Thumb extension NMES application 12/08/2023: L handed typing words, Constraint-induced Movement therapy 12/22/2023: functional activity list 01/19/2024: mat stretches Access Code: [email or web address]  GOALS:  SHORT TERM GOALS: Target date: 12/01/2023  Patient will demonstrate independence with initial LUE HEP. Baseline: Goal status: IN PROGRESS  2.   Patient will demonstrate at least 15 lbs L grip strength as needed to open jars and other containers.  Baseline: unable to register force 12/08/2023: 8.3 lbs (goal revised) Goal status: IN PROGRESS   LONG TERM GOALS: Target date: 01/26/2024   Patient will demonstrate updated LUE HEP with 25% verbal cues or less for proper execution. Baseline:  Goal status: INITIAL  2.  Pt will be able to place at least 7 blocks using left hand with completion of Box and Blocks test. Baseline: 2 blocks Goal status: IN PROGRESS  3.  Pt to demonstrate improved L thumb abduction AROM as needed to position business card between 1st and 2nd digits.  Baseline: unable to complete Goal status: IN PROGRESS  4.  Pt to demonstrate at least 50% L composite extension AROM as needed to grasp items in hand. Baseline: <25% Goal status: PROGRESS  ASSESSMENT:  CLINICAL IMPRESSION: Pt demonstrating good  understanding of HEP with good challenge to L shoulder stability. Will continue to monitor response to Botox  and determine d/c vs extension.  PERFORMANCE DEFICITS in functional skills including ADLs, IADLs, coordination, dexterity, edema, tone, ROM, strength, Fine motor control, and UE functional use.   IMPAIRMENTS are limiting patient from ADLs, IADLs, and leisure.   COMORBIDITIES may have co-morbidities  that affects occupational performance. Patient will benefit from skilled OT to address above impairments and improve overall function.  REHAB POTENTIAL: Good  PLAN:  OT FREQUENCY: ADDITIONAL 1x/week  OT DURATION: ADDITIONAL  6 weeks  PLANNED INTERVENTIONS: self care/ADL training, therapeutic exercise, therapeutic activity, neuromuscular re-education, manual therapy, passive range of motion, splinting, electrical stimulation, ultrasound, moist heat, patient/family education, and Re-evaluation  RECOMMENDED OTHER SERVICES: N/A for this visit  CONSULTED AND AGREED WITH PLAN OF CARE: Patient  PLAN FOR NEXT SESSION:  d/c vs extension  taping PRN; review use of NMES PRN; review HEP  Prone activities  Additional review List of functional activities, how is [facility]  Altamease Asters, OT 01/19/2024, 1:54 PM

## 2024-01-19 NOTE — Patient Instructions (Signed)
  [email or web address]  Access Code: [email or web address]

## 2024-01-25 NOTE — Therapy (Deleted)
 OUTPATIENT OCCUPATIONAL THERAPY NEURO TREATMENT   Patient Name: Vincent Ferrell MRN: [id number] DOB:1961/02/07, 63 y.o., male Today's Date: 01/25/2024  PCP: Allean Aran, DO REFERRING PROVIDER: Genetta Kenning, MD    Past Medical History:  Diagnosis Date   Chest pain in adult 10/24/2017   Elevated BP without diagnosis of hypertension 10/24/2017   GERD (gastroesophageal reflux disease)    Hypertension    Impaired fasting glucose 10/24/2017   Mixed hyperlipidemia 10/24/2017   Multifocal pneumonia 12/30/2021   Rising PSA level 10/24/2017   Stroke [facility])    Past Surgical History:  Procedure Laterality Date   COLONOSCOPY WITH PROPOFOL  N/A 12/28/2021   Procedure: COLONOSCOPY WITH PROPOFOL ;  Surgeon: Lindle Rhea, MD;  Location: [facility] ENDOSCOPY;  Service: Gastroenterology;  Laterality: N/A;   COLONOSCOPY WITH PROPOFOL  N/A 12/29/2021   Procedure: COLONOSCOPY WITH PROPOFOL ;  Surgeon: Lindle Rhea, MD;  Location: [facility] ENDOSCOPY;  Service: Gastroenterology;  Laterality: N/A;   ESOPHAGOGASTRODUODENOSCOPY (EGD) WITH PROPOFOL  N/A 12/29/2021   Procedure: ESOPHAGOGASTRODUODENOSCOPY (EGD) WITH PROPOFOL ;  Surgeon: Lindle Rhea, MD;  Location: [facility] ENDOSCOPY;  Service: Gastroenterology;  Laterality: N/A;   ESOPHAGOGASTRODUODENOSCOPY (EGD) WITH PROPOFOL   01/05/2022   Procedure: ESOPHAGOGASTRODUODENOSCOPY (EGD) WITH PROPOFOL ;  Surgeon: Anda Bamberg, MD;  Location: [facility] ENDOSCOPY;  Service: General;;   IR CT HEAD LTD  12/25/2021   IR PERCUTANEOUS ART THROMBECTOMY/INFUSION INTRACRANIAL INC DIAG ANGIO  12/25/2021   PEG PLACEMENT N/A 01/05/2022   Procedure: PERCUTANEOUS ENDOSCOPIC GASTROSTOMY (PEG) PLACEMENT;  Surgeon: Anda Bamberg, MD;  Location: [facility] ENDOSCOPY;  Service: General;  Laterality: N/A;   RADIOLOGY WITH ANESTHESIA N/A 12/25/2021   Procedure: IR WITH ANESTHESIA;  Surgeon: Radiologist, Medication, MD;  Location: [facility] OR;  Service: Radiology;  Laterality: N/A;   TRANSURETHRAL  RESECTION OF PROSTATE N/A 12/29/2021   Procedure: TRANSURETHRAL RESECTION OF THE PROSTATE (TURP);  Surgeon: Samson Croak, MD;  Location: [facility] OR;  Service: Urology;  Laterality: N/A;   Patient Active Problem List   Diagnosis Date Noted   Spastic hemiplegia affecting nondominant side (HCC) 07/29/2023   Encephalopathy acute 01/07/2022   Severe protein-calorie malnutrition (HCC) 01/07/2022   Physical deconditioning 01/07/2022   Diarrhea 01/07/2022   Pericardial effusion    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   Prostate abscess 12/30/2021   Acute kidney injury (HCC) 12/30/2021   Anemia due to GI blood loss    Acute respiratory failure with hypoxia (HCC)    Acute ischemic right MCA stroke (HCC) 12/25/2021   Sinus tachycardia    Chest pain in adult 10/24/2017   Mixed hyperlipidemia 10/24/2017   Rising PSA level 10/24/2017   Impaired fasting glucose 10/24/2017   Elevated BP without diagnosis of hypertension 10/24/2017    Rationale for Evaluation and Treatment: Rehabilitation  ONSET DATE: 10/21/2023 (date of referral)  REFERRING DIAG: G81.10 (ICD-10-CM) - Spastic hemiplegia affecting nondominant side   THERAPY DIAG:  No diagnosis found.  SUBJECTIVE:   SUBJECTIVE STATEMENT: Pt reports he had Botox  and is noticing some changes.   Pt accompanied by: self  PERTINENT HISTORY: PMH: endocarditis, HTN, HLD  MRI from April, 2024 showed subacute ischemia in the right MCA, right occipital lobe, left frontal and parietal white matter and petechial blood in the left parietal lobe   PRECAUTIONS: None  WEIGHT BEARING RESTRICTIONS No  PAIN:  Are you having pain? Yes: NPRS scale: 1/10 Pain location: L hand Pain description: sore, stiff  FALLS: Has patient fallen in last 6 months? Yes. 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  PATIENT GOALS Improve use of LUE  OBJECTIVE:   HAND DOMINANCE: Right  FUNCTIONAL OUTCOME MEASURES: Quick Dash: 18.2 % disability with use of LUE   UPPER EXTREMITY ROM:     AROM Right (eval) Left (eval)  Shoulder flexion WNL 90  Shoulder abduction WNL 70  Elbow flexion WNL 120  Elbow extension WNL WNL  Wrist flexion WNL 35  Wrist extension WNL 70  Wrist pronation WNL WNL  Wrist supination WNL 10   Digit Composite Flexion WNL Lacks 8 cm thumb, 2 cm ring finger, and 3 cm pinky  Digit Composite Extension WNL Trace movements  Digit Opposition WNL To index only  (Blank rows = not tested)  HAND FUNCTION: Grip strength: Right: 65 lbs; Left: unable to register (manual)  COORDINATION: Box and Blocks:   Left 2 blocks  SENSATION: Occasional paresthesias reported  MUSCLE TONE: LUE: Mild and Hypertonic ; uses Baclofen and heating pad  COGNITION: Overall cognitive status: Within functional limits for tasks assessed  PERCEPTION: Not tested  PRAXIS: Not tested  TODAY'S TREATMENT:                                                                                                                               Access Code: [email or web address] URL: [email or web address] Date: 01/19/2024 Prepared by: Charissa Compton  Exercises - Baby Cobra Hands Down  - 1 x daily - 5 reps - 5-10 second hold - Supine Bridge  - 1 x daily - 5 reps - 5-10 second hold - Quadruped Arm Raise  - 1 x daily - 5 reps - 5-10 second hold - Quadruped Rocking Backward  - 1 x daily - 5 reps - 5-10 second hold - Quadruped Thoracic Spine Extension  - 1 x daily - 5 reps - 5-10 second hold - Supine Scapular Retraction  - 1 x daily - 5 reps - 5-10 second hold - Sleeper Stretch  - 1 x daily - 5 reps - 5-10 second hold  PATIENT EDUCATION: Education details: LUE HEP Person educated: Patient Education method: Explanation, demonstrated Education  comprehension: verbalized understanding, returned demonstration, requires further review/instruction  HOME EXERCISE PROGRAM: 11/10/2023: NMES placement and parameter tables  11/23/2023: Thumb extension NMES application 12/08/2023: L handed typing words, Constraint-induced Movement therapy 12/22/2023: functional activity list 01/19/2024: mat stretches Access Code: [email or web address]  GOALS:  SHORT TERM GOALS: Target date: 12/01/2023  Patient will demonstrate independence with initial LUE HEP. Baseline: Goal status: IN PROGRESS  2.   Patient will demonstrate at least 15 lbs L grip strength as needed to open jars and other containers.  Baseline: unable to register force 12/08/2023: 8.3 lbs (goal revised) Goal status: IN PROGRESS   LONG TERM GOALS: Target date: 01/26/2024   Patient will demonstrate updated LUE HEP with 25%  verbal cues or less for proper execution. Baseline:  Goal status: INITIAL  2.  Pt will be able to place at least 7 blocks using left hand with completion of Box and Blocks test. Baseline: 2 blocks Goal status: IN PROGRESS  3.  Pt to demonstrate improved L thumb abduction AROM as needed to position business card between 1st and 2nd digits.  Baseline: unable to complete Goal status: IN PROGRESS  4.  Pt to demonstrate at least 50% L composite extension AROM as needed to grasp items in hand. Baseline: <25% Goal status: PROGRESS  ASSESSMENT:  CLINICAL IMPRESSION: Pt demonstrating good understanding of HEP with good challenge to L shoulder stability. Will continue to monitor response to Botox  and determine d/c vs extension.  PERFORMANCE DEFICITS in functional skills including ADLs, IADLs, coordination, dexterity, edema, tone, ROM, strength, Fine motor control, and UE functional use.   IMPAIRMENTS are limiting patient from ADLs, IADLs, and leisure.   COMORBIDITIES may have co-morbidities  that affects occupational performance. Patient will benefit from skilled OT to address  above impairments and improve overall function.  REHAB POTENTIAL: Good  PLAN:  OT FREQUENCY: ADDITIONAL 1x/week  OT DURATION: ADDITIONAL  6 weeks  PLANNED INTERVENTIONS: self care/ADL training, therapeutic exercise, therapeutic activity, neuromuscular re-education, manual therapy, passive range of motion, splinting, electrical stimulation, ultrasound, moist heat, patient/family education, and Re-evaluation  RECOMMENDED OTHER SERVICES: N/A for this visit  CONSULTED AND AGREED WITH PLAN OF CARE: Patient  PLAN FOR NEXT SESSION:  d/c vs extension  taping PRN; review use of NMES PRN; review HEP  Prone activities  Additional review List of functional activities, how is [facility]  Altamease Asters, OT 01/25/2024, 5:02 PM

## 2024-01-26 ENCOUNTER — Ambulatory Visit: Admitting: Occupational Therapy

## 2024-02-02 ENCOUNTER — Ambulatory Visit: Admitting: Occupational Therapy

## 2024-02-02 DIAGNOSIS — R29818 Other symptoms and signs involving the nervous system: Secondary | ICD-10-CM

## 2024-02-02 DIAGNOSIS — R278 Other lack of coordination: Secondary | ICD-10-CM

## 2024-02-02 DIAGNOSIS — R208 Other disturbances of skin sensation: Secondary | ICD-10-CM

## 2024-02-02 DIAGNOSIS — M6281 Muscle weakness (generalized): Secondary | ICD-10-CM

## 2024-02-02 NOTE — Patient Instructions (Signed)
 Complete washcloth squeezes, rotation palm up, bring arm out to side, rotate palm down, drop wrist to open fingers and drop washcloth.

## 2024-02-02 NOTE — Therapy (Signed)
 OUTPATIENT OCCUPATIONAL THERAPY NEURO TREATMENT & DISCHARGE  Patient Name: Vincent Ferrell MRN: [id number] DOB:02-14-1961, 63 y.o., male Today's Date: 02/02/2024  OCCUPATIONAL THERAPY DISCHARGE SUMMARY  Visits from Start of Care: 12  Current functional level related to goals / functional outcomes: Patient has met 1/2 short-term goals and 2/4 long-term goals to date.   Remaining deficits: LUE spasticity; impaired ROM, strength, and coordination   Education / Equipment: Continue to complete HEP and functional activities following OT d/c.    Patient agrees to discharge. Patient goals were partially met. Patient is being discharged due to Pt demonstrates good understanding of HEP as needed to continue following OT d/c for further remediation efforts. Would recommend pt return to therapy after next round of Botox  and/or after he receives new NMES unit as needed. ..   PCP: Allean Aran, DO REFERRING PROVIDER: Genetta Kenning, MD   OT End of Session - 02/02/24 1450     Visit Number 12    Number of Visits 13    Date for OT Re-Evaluation 01/26/24    Authorization Type [employer] - 90 OT, PT, ST VL combined    OT Start Time 1450    OT Stop Time 1531    OT Time Calculation (min) 41 min    Activity Tolerance Patient tolerated treatment well    Behavior During Therapy [facility] for tasks assessed/performed            Past Medical History:  Diagnosis Date   Chest pain in adult 10/24/2017   Elevated BP without diagnosis of hypertension 10/24/2017   GERD (gastroesophageal reflux disease)    Hypertension    Impaired fasting glucose 10/24/2017   Mixed hyperlipidemia 10/24/2017   Multifocal pneumonia 12/30/2021   Rising PSA level 10/24/2017   Stroke [facility])    Past Surgical History:  Procedure Laterality Date   COLONOSCOPY WITH PROPOFOL  N/A 12/28/2021   Procedure: COLONOSCOPY WITH PROPOFOL ;  Surgeon: Lindle Rhea, MD;  Location[facility] ENDOSCOPY;  Service: Gastroenterology;   Laterality: N/A;   COLONOSCOPY WITH PROPOFOL  N/A 12/29/2021   Procedure: COLONOSCOPY WITH PROPOFOL ;  Surgeon: Lindle Rhea, MD;  Location: [facility] ENDOSCOPY;  Service: Gastroenterology;  Laterality: N/A;   ESOPHAGOGASTRODUODENOSCOPY (EGD) WITH PROPOFOL  N/A 12/29/2021   Procedure: ESOPHAGOGASTRODUODENOSCOPY (EGD) WITH PROPOFOL ;  Surgeon: Lindle Rhea, MD;  Location: [facility] ENDOSCOPY;  Service: Gastroenterology;  Laterality: N/A;   ESOPHAGOGASTRODUODENOSCOPY (EGD) WITH PROPOFOL   01/05/2022   Procedure: ESOPHAGOGASTRODUODENOSCOPY (EGD) WITH PROPOFOL ;  Surgeon: Anda Bamberg, MD;  Location: [facility] ENDOSCOPY;  Service: General;;   IR CT HEAD LTD  12/25/2021   IR PERCUTANEOUS ART THROMBECTOMY/INFUSION INTRACRANIAL INC DIAG ANGIO  12/25/2021   PEG PLACEMENT N/A 01/05/2022   Procedure: PERCUTANEOUS ENDOSCOPIC GASTROSTOMY (PEG) PLACEMENT;  Surgeon: Anda Bamberg, MD;  Location: [facility] ENDOSCOPY;  Service: General;  Laterality: N/A;   RADIOLOGY WITH ANESTHESIA N/A 12/25/2021   Procedure: IR WITH ANESTHESIA;  Surgeon: Radiologist, Medication, MD;  Location: [facility] OR;  Service: Radiology;  Laterality: N/A;   TRANSURETHRAL RESECTION OF PROSTATE N/A 12/29/2021   Procedure: TRANSURETHRAL RESECTION OF THE PROSTATE (TURP);  Surgeon: Samson Croak, MD;  Location: [facility] OR;  Service: Urology;  Laterality: N/A;   Patient Active Problem List   Diagnosis Date Noted   Spastic hemiplegia affecting nondominant side (HCC) 07/29/2023   Encephalopathy acute 01/07/2022   Severe protein-calorie malnutrition (HCC) 01/07/2022   Physical deconditioning 01/07/2022   Diarrhea 01/07/2022   Pericardial effusion    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   Prostate abscess  12/30/2021   Acute kidney injury (HCC) 12/30/2021   Anemia due to GI blood loss    Acute respiratory failure with hypoxia (HCC)    Acute ischemic right MCA stroke (HCC) 12/25/2021   Sinus tachycardia    Chest pain in adult 10/24/2017   Mixed hyperlipidemia  10/24/2017   Rising PSA level 10/24/2017   Impaired fasting glucose 10/24/2017   Elevated BP without diagnosis of hypertension 10/24/2017    Rationale for Evaluation and Treatment: Rehabilitation  ONSET DATE: 10/21/2023 (date of referral)  REFERRING DIAG: G81.10 (ICD-10-CM) - Spastic hemiplegia affecting nondominant side   THERAPY DIAG:  Other symptoms and signs involving the nervous system  Other lack of coordination  Muscle weakness (generalized)  Other disturbances of skin sensation  SUBJECTIVE:   SUBJECTIVE STATEMENT: Pt reports he still is waiting to receive a new NMES unit.   Pt accompanied by: self  PERTINENT HISTORY: PMH: endocarditis, HTN, HLD  MRI from April, 2024 showed subacute ischemia in the right MCA, right occipital lobe, left frontal and parietal white matter and petechial blood in the left parietal lobe   PRECAUTIONS: None  WEIGHT BEARING RESTRICTIONS No  PAIN:  Are you having pain? Yes: NPRS scale: 1/10 Pain location: L hand Pain description: sore, stiff  FALLS: Has patient fallen in last 6 months? Yes. 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 GOALS Improve use of LUE  OBJECTIVE:   HAND DOMINANCE: Right  FUNCTIONAL OUTCOME MEASURES: Quick Dash: 18.2 % disability with use of LUE   UPPER EXTREMITY ROM:     AROM Right (eval) Left (eval)  Shoulder flexion WNL 90  Shoulder abduction WNL 70  Elbow flexion WNL 120  Elbow extension WNL WNL  Wrist flexion WNL 35  Wrist extension WNL 70  Wrist pronation WNL WNL  Wrist supination WNL 10   Digit Composite Flexion WNL Lacks 8 cm thumb, 2 cm ring finger, and 3 cm pinky  Digit Composite Extension WNL Trace movements  Digit Opposition WNL To index only  (Blank rows = not  tested)  HAND FUNCTION: Grip strength: Right: 65 lbs; Left: unable to register (manual)  COORDINATION: Box and Blocks:   Left 2 blocks  SENSATION: Occasional paresthesias reported  MUSCLE TONE: LUE: Mild and Hypertonic ; uses Baclofen and heating pad  COGNITION: Overall cognitive status: Within functional limits for tasks assessed  PERCEPTION: Not tested  PRAXIS: Not tested  TODAY'S TREATMENT:                                                                                                                               Therapist reviewed goals with patient and updated patient progression.  No additional functional limitations identified. Objective measures assessed as noted in Goals section to determine progression  towards goals. Added new ROM and functional activity to pt's HEP as noted in pt instructions. Pt able to access on [employer] phone app.   PATIENT EDUCATION: Education details: OT d/c; HEP review Person educated: Patient Education method: Explanation, demonstrated; handouts Education comprehension: verbalized understanding, returned demonstration, requires further review/instruction  HOME EXERCISE PROGRAM: 11/10/2023: NMES placement and parameter tables  11/23/2023: Thumb extension NMES application 12/08/2023: L handed typing words, Constraint-induced Movement therapy 12/22/2023: functional activity list 01/19/2024: mat stretches Access Code: [email or web address] 02/02/2024: PIP ext PROM; washcloth grasp and release  GOALS:  SHORT TERM GOALS: Target date: 12/01/2023  Patient will demonstrate independence with initial LUE HEP. Baseline: Goal status: MET  2.   Patient will demonstrate at least 15 lbs L grip strength as needed to open jars and other containers.  Baseline: unable to register force 12/08/2023: 8.3 lbs (goal revised) 02/02/2024:  Goal status: IN PROGRESS   LONG TERM GOALS: Target date: 01/26/2024   Patient will demonstrate updated LUE HEP with 25% verbal cues or  less for proper execution. Baseline:  Goal status: MET  2.  Pt will be able to place at least 7 blocks using left hand with completion of Box and Blocks test. Baseline: 2 blocks Goal status: IN PROGRESS  3.  Pt to demonstrate improved L thumb abduction AROM as needed to position business card between 1st and 2nd digits.  Baseline: unable to complete Goal status: MET  4.  Pt to demonstrate at least 50% L composite extension AROM as needed to grasp items in hand. Baseline: <25% Goal status: PROGRESS MADE  ASSESSMENT:  CLINICAL IMPRESSION: Patient is appropriate for discharge and no longer demonstrates medical necessity for continued skilled occupational therapy services. Pt demonstrates good understanding of HEP as needed to continue following OT d/c for further remediation efforts. Would recommend pt return to therapy after next round of Botox  and/or after he receives new NMES unit as needed.  PERFORMANCE DEFICITS in functional skills including ADLs, IADLs, coordination, dexterity, edema, tone, ROM, strength, Fine motor control, and UE functional use.   IMPAIRMENTS are limiting patient from ADLs, IADLs, and leisure.   COMORBIDITIES may have co-morbidities  that affects occupational performance. Patient will benefit from skilled OT to address above impairments and improve overall function.  REHAB POTENTIAL: Good  PLAN:  OT D/C Completed  CONSULTED AND AGREED WITH PLAN OF CARE: Patient   Altamease Asters, OT 02/02/2024, 5:02 PM

## 2024-02-10 ENCOUNTER — Encounter: Payer: Self-pay | Admitting: Physical Medicine & Rehabilitation

## 2024-04-20 ENCOUNTER — Encounter: Payer: Self-pay | Admitting: Physical Medicine & Rehabilitation

## 2024-04-20 ENCOUNTER — Encounter: Attending: Physical Medicine & Rehabilitation | Admitting: Physical Medicine & Rehabilitation

## 2024-04-20 VITALS — BP 122/82 | HR 53 | Ht 74.0 in | Wt 221.6 lb

## 2024-04-20 DIAGNOSIS — G8110 Spastic hemiplegia affecting unspecified side: Secondary | ICD-10-CM | POA: Insufficient documentation

## 2024-04-20 DIAGNOSIS — G8114 Spastic hemiplegia affecting left nondominant side: Secondary | ICD-10-CM | POA: Insufficient documentation

## 2024-04-20 MED ORDER — ONABOTULINUMTOXINA 100 UNITS IJ SOLR
200.0000 [IU] | Freq: Once | INTRAMUSCULAR | Status: AC
  Administered 2024-04-20: 200 [IU] via INTRAMUSCULAR

## 2024-04-20 MED ORDER — SODIUM CHLORIDE (PF) 0.9 % IJ SOLN
4.0000 mL | Freq: Once | INTRAMUSCULAR | Status: AC
  Administered 2024-04-20: 4 mL

## 2024-04-20 NOTE — Progress Notes (Signed)
 Botox  Injection for spasticity using needle EMG guidance  Dilution: 50 Units/ml Indication: Severe spasticity which interferes with ADL,mobility and/or  hygiene and is unresponsive to medication management and other conservative care Informed consent was obtained after describing risks and benefits of the procedure with the patient. This includes bleeding, bruising, infection, excessive weakness, or medication side effects. A REMS form is on file and signed. Needle: 27g 1" needle electrode Number of units per muscle Left   FDS 50 FDP 50 PT 50 FCR 50 All injections were done after obtaining appropriate EMG activity and after negative drawback for blood. The patient tolerated the procedure well. Post procedure instructions were given. A followup appointment was made.

## 2024-04-23 ENCOUNTER — Ambulatory Visit
Admission: RE | Admit: 2024-04-23 | Discharge: 2024-04-23 | Disposition: A | Discharge status: Home / Self Care | Source: Ambulatory Visit

## 2024-04-23 VITALS — BP 117/84 | HR 56 | Temp 97.7°F | Resp 16

## 2024-04-23 DIAGNOSIS — G9331 Postviral fatigue syndrome: Secondary | ICD-10-CM | POA: Diagnosis not present

## 2024-04-23 MED ORDER — PREDNISONE 20 MG PO TABS: 40.0000 mg | ORAL_TABLET | Freq: Every day | ORAL | 0 refills | Status: AC

## 2024-04-23 MED ORDER — AZELASTINE HCL 0.1 % NA SOLN: 1.0000 | Freq: Two times a day (BID) | NASAL | 1 refills | Status: AC

## 2024-04-23 MED ORDER — PROMETHAZINE-DM 6.25-15 MG/5ML PO SYRP: 5.0000 mL | ORAL_SOLUTION | Freq: Four times a day (QID) | ORAL | 0 refills | Status: DC | PRN

## 2024-04-23 NOTE — ED Triage Notes (Signed)
 Pt states he has had cold for over 1 week. C/o cough, congestion, and runny nose.

## 2024-04-23 NOTE — ED Provider Notes (Signed)
 [facility]  Note:  This document was prepared using Dragon voice recognition software and may include unintentional dictation errors.  MRN: [id number] DOB: 05-17-1961  Subjective:   Vincent Ferrell is a 63 y.o. male presenting for persistent cough, nasal congestion, raspy throat, frequent clearing of throat x 6 to 7 days.  Patient reports that he has been using DayQuil and NyQuil with mild improvement.  States the cough is much worse at night while trying to lay down.  Denies shortness of breath, chest pain, weakness, dizziness.  Patient was concerned that he may have had COVID but never tested at home.  Patient states that a few years ago he had symptoms similar to this and was seen by his doctor and prescribed prednisone  which seemed to help symptoms considerably.  No current facility-administered medications for this encounter.  Current Outpatient Medications:    azelastine  (ASTELIN ) 0.1 % nasal spray, Place 1 spray into both nostrils 2 (two) times daily. Use in each nostril as directed, Disp: 30 mL, Rfl: 1   predniSONE  (DELTASONE ) 20 MG tablet, Take 2 tablets (40 mg total) by mouth daily for 5 days., Disp: 10 tablet, Rfl: 0   promethazine -dextromethorphan (PROMETHAZINE -DM) 6.25-15 MG/5ML syrup, Take 5 mLs by mouth 4 (four) times daily as needed for cough., Disp: 118 mL, Rfl: 0   ascorbic acid (VITAMIN C) 500 MG tablet, Take by mouth., Disp: , Rfl:    aspirin EC 81 MG tablet, Take by mouth., Disp: , Rfl:    atorvastatin  (LIPITOR) 40 MG tablet, Place 1 tablet (40 mg total) into feeding tube daily., Disp: 30 tablet, Rfl: 0   Baclofen 5 MG TABS, Take 5 mg by mouth 3 (three) times daily., Disp: , Rfl:    diclofenac Sodium (VOLTAREN ARTHRITIS PAIN) 1 % GEL, Apply topically daily., Disp: , Rfl:    Lactobacillus-Inulin (CULTURELLE DIGESTIVE DAILY PO), Take by mouth daily., Disp: , Rfl:    lisinopril  (ZESTRIL ) 10 MG tablet, Place 1 tablet (10 mg total) into feeding tube  daily., Disp: , Rfl:    MELATONIN PO, Take by mouth as needed., Disp: , Rfl:    metoprolol tartrate (LOPRESSOR) 25 MG tablet, Take 25 mg by mouth 2 (two) times daily., Disp: , Rfl:    Multiple Vitamin (MULTI-VITAMIN) tablet, Take 1 tablet by mouth daily., Disp: , Rfl:    pantoprazole  sodium (PROTONIX ) 40 mg, Place 40 mg into feeding tube 2 (two) times daily., Disp: , Rfl:    Sennosides (SENOKOT PO), Take by mouth daily at 2 PM., Disp: , Rfl:    tamsulosin (FLOMAX) 0.4 MG CAPS capsule, Take by mouth., Disp: , Rfl:    Allergies  Allergen Reactions   Pollen Extract Other (See Comments)    Sneezing    Shellfish Allergy     Past Medical History:  Diagnosis Date   Chest pain in adult 10/24/2017   Elevated BP without diagnosis of hypertension 10/24/2017   GERD (gastroesophageal reflux disease)    Hypertension    Impaired fasting glucose 10/24/2017   Mixed hyperlipidemia 10/24/2017   Multifocal pneumonia 12/30/2021   Rising PSA level 10/24/2017   Stroke Vincent Ferrell)      Past Surgical History:  Procedure Laterality Date   COLONOSCOPY WITH PROPOFOL  N/A 12/28/2021   Procedure: COLONOSCOPY WITH PROPOFOL ;  Surgeon: Eda Iha, MD;  Location[facility] ENDOSCOPY;  Service: Gastroenterology;  Laterality: N/A;   COLONOSCOPY WITH PROPOFOL  N/A 12/29/2021   Procedure: COLONOSCOPY WITH PROPOFOL ;  Surgeon: Eda Iha, MD;  Location: [facility]  ENDOSCOPY;  Service: Gastroenterology;  Laterality: N/A;   ESOPHAGOGASTRODUODENOSCOPY (EGD) WITH PROPOFOL  N/A 12/29/2021   Procedure: ESOPHAGOGASTRODUODENOSCOPY (EGD) WITH PROPOFOL ;  Surgeon: Eda Iha, MD;  Location: [facility] ENDOSCOPY;  Service: Gastroenterology;  Laterality: N/A;   ESOPHAGOGASTRODUODENOSCOPY (EGD) WITH PROPOFOL   01/05/2022   Procedure: ESOPHAGOGASTRODUODENOSCOPY (EGD) WITH PROPOFOL ;  Surgeon: Paola Dreama SAILOR, MD;  Location: [facility] ENDOSCOPY;  Service: General;;   IR CT HEAD LTD  12/25/2021   IR PERCUTANEOUS ART THROMBECTOMY/INFUSION INTRACRANIAL INC  DIAG ANGIO  12/25/2021   PEG PLACEMENT N/A 01/05/2022   Procedure: PERCUTANEOUS ENDOSCOPIC GASTROSTOMY (PEG) PLACEMENT;  Surgeon: Paola Dreama SAILOR, MD;  Location: [facility] ENDOSCOPY;  Service: General;  Laterality: N/A;   RADIOLOGY WITH ANESTHESIA N/A 12/25/2021   Procedure: IR WITH ANESTHESIA;  Surgeon: Radiologist, Medication, MD;  Location: [facility] OR;  Service: Radiology;  Laterality: N/A;   TRANSURETHRAL RESECTION OF PROSTATE N/A 12/29/2021   Procedure: TRANSURETHRAL RESECTION OF THE PROSTATE (TURP);  Surgeon: Carolee Sherwood JONETTA DOUGLAS, MD;  Location: [facility] OR;  Service: Urology;  Laterality: N/A;    Family History  Problem Relation Age of Onset   Stroke Neg Hx     Social History   Tobacco Use   Smoking status: Never    Passive exposure: Never   Smokeless tobacco: Never  Vaping Use   Vaping status: Never Used  Substance Use Topics   Alcohol use: Yes    Comment: occ   Drug use: Never    ROS Refer to HPI for ROS details.  Objective:    Vitals: BP 117/84 (BP Location: Right Arm)   Pulse (!) 56   Temp 97.7 F (36.5 C) (Oral)   Resp 16   SpO2 94%   Physical Exam Vitals and nursing note reviewed.  Constitutional:      General: He is not in acute distress.    Appearance: Normal appearance. He is well-developed. He is not ill-appearing or toxic-appearing.  HENT:     Head: Normocephalic.     Nose: Congestion present. No rhinorrhea.     Mouth/Throat:     Mouth: Mucous membranes are moist.     Pharynx: Oropharynx is clear.  Eyes:     General:        Right eye: No discharge.        Left eye: No discharge.     Extraocular Movements: Extraocular movements intact.     Conjunctiva/sclera: Conjunctivae normal.  Cardiovascular:     Rate and Rhythm: Normal rate and regular rhythm.     Heart sounds: Normal heart sounds. No murmur heard. Pulmonary:     Effort: Pulmonary effort is normal. No respiratory distress.     Breath sounds: Normal breath sounds. No stridor. No wheezing, rhonchi or  rales.  Chest:     Chest wall: No tenderness.  Skin:    General: Skin is warm and dry.  Neurological:     General: No focal deficit present.     Mental Status: He is alert and oriented to person, place, and time.  Psychiatric:        Mood and Affect: Mood normal.        Behavior: Behavior normal.     Procedures  No results found for this or any previous visit (from the past 24 hours).  Assessment and Plan :     Discharge Instructions       1. Postviral syndrome (Primary) - predniSONE  (DELTASONE ) 20 MG tablet; Take 2 tablets (40 mg total) by mouth daily for 5 days.  Dispense: 10 tablet; Refill: 0 - azelastine  (ASTELIN ) 0.1 % nasal spray; Place 1 spray into both nostrils 2 (two) times daily. Use in each nostril as directed  Dispense: 30 mL; Refill: 1 - promethazine -dextromethorphan (PROMETHAZINE -DM) 6.25-15 MG/5ML syrup; Take 5 mLs by mouth 4 (four) times daily as needed for cough.  Dispense: 118 mL; Refill: 0 -Continue to monitor symptoms for any change in severity if there is any escalation of current symptoms or development of new symptoms follow-up in ER for further evaluation and management.      Meliah Appleman B Dayveon Halley   Portia Wisdom, [place], [place] 04/23/24 [phone]

## 2024-04-23 NOTE — Discharge Instructions (Signed)
  1. Postviral syndrome (Primary) - predniSONE  (DELTASONE ) 20 MG tablet; Take 2 tablets (40 mg total) by mouth daily for 5 days.  Dispense: 10 tablet; Refill: 0 - azelastine  (ASTELIN ) 0.1 % nasal spray; Place 1 spray into both nostrils 2 (two) times daily. Use in each nostril as directed  Dispense: 30 mL; Refill: 1 - promethazine -dextromethorphan (PROMETHAZINE -DM) 6.25-15 MG/5ML syrup; Take 5 mLs by mouth 4 (four) times daily as needed for cough.  Dispense: 118 mL; Refill: 0 -Continue to monitor symptoms for any change in severity if there is any escalation of current symptoms or development of new symptoms follow-up in ER for further evaluation and management.

## 2024-04-28 ENCOUNTER — Encounter (INDEPENDENT_AMBULATORY_CARE_PROVIDER_SITE_OTHER): Payer: Self-pay

## 2024-04-30 ENCOUNTER — Ambulatory Visit (INDEPENDENT_AMBULATORY_CARE_PROVIDER_SITE_OTHER): Admitting: Radiology

## 2024-04-30 ENCOUNTER — Other Ambulatory Visit: Payer: Self-pay

## 2024-04-30 ENCOUNTER — Ambulatory Visit
Admission: RE | Admit: 2024-04-30 | Discharge: 2024-04-30 | Disposition: A | Discharge status: Home / Self Care | Attending: Physician Assistant | Admitting: Physician Assistant

## 2024-04-30 VITALS — BP 103/70 | HR 62 | Temp 97.6°F | Resp 17 | Ht 74.0 in | Wt 220.0 lb

## 2024-04-30 DIAGNOSIS — R0989 Other specified symptoms and signs involving the circulatory and respiratory systems: Secondary | ICD-10-CM | POA: Diagnosis not present

## 2024-04-30 DIAGNOSIS — R051 Acute cough: Secondary | ICD-10-CM

## 2024-04-30 MED ORDER — ALBUTEROL SULFATE HFA 108 (90 BASE) MCG/ACT IN AERS: 1.0000 | INHALATION_SPRAY | Freq: Four times a day (QID) | RESPIRATORY_TRACT | 0 refills | Status: DC | PRN

## 2024-04-30 MED ORDER — AMOXICILLIN-POT CLAVULANATE 875-125 MG PO TABS: 1.0000 | ORAL_TABLET | Freq: Two times a day (BID) | ORAL | 0 refills | Status: AC

## 2024-04-30 MED ORDER — AZITHROMYCIN 250 MG PO TABS: ORAL_TABLET | ORAL | 0 refills | Status: AC

## 2024-04-30 NOTE — Discharge Instructions (Addendum)
 You are seen today for concerns of an ongoing, dry cough that does not seem to be responding well to medications that you are previously prescribed. Your x-ray appeared a bit hazy to me but radiology interpretation did not show signs of acute pneumonia or acute cardiopulmonary illness. I have sent in a prescription for 2 antibiotics for you to take.  1 is called Augmentin  for you to take by mouth twice per day for 7 days.  The other is azithromycin , also known as a Z-Pak, for you to take by mouth per the instructions on the container.  I am also sending in an inhaler for you to use as needed up to every 6 hours for shortness of breath and coughing. In addition to these medications you can also take over-the-counter medications such as Robitussin, Mucinex , Tylenol  and ibuprofen as needed for symptom management. If you feel your symptoms are not improving or seeming worsening I recommend follow-up with your primary care provider or going to the ED if you are experiencing significant trouble breathing, chest pain, fever that is not responding to tylenol  or ibuprofen, coughing up blood, confusion, loss of consciousness.

## 2024-04-30 NOTE — ED Triage Notes (Signed)
 Pt presents to urgent care for follow-up. Pt states he is still coughing. Cough has been ongoing for two weeks. Accompanied with wheezing lying down, congestion, and intermittent SOB. Pt states he became concerned Saturday night 8/16 when he could not stop coughing. Felt like he could not catch his breath, debated whether to go to ED. Was prescribed nasal spray, cough syrup, and prednisone  with little improvement.

## 2024-04-30 NOTE — ED Provider Notes (Signed)
 [facility]    CSN: [id number] Arrival date & time: 04/30/24  9073      History   Chief Complaint Chief Complaint  Patient presents with   Cough    follow up to visit 1 week ago - Entered by patient   Follow-up    HPI Vincent Ferrell is a 63 y.o. male.   HPI  Pt presents today with concerns for persistent dry cough He states this became so severe Sat that he considered going to the ED for evaluation He reports having nasal congestion and runny nose  He states the medications he was prescribed on 04/23/24 seemed to be working and providing improvement until Sat night when his symptoms returned He states his breathing seems raspy when he lays down and feels like there is mucus in the back of the throat    Past Medical History:  Diagnosis Date   Chest pain in adult 10/24/2017   Elevated BP without diagnosis of hypertension 10/24/2017   GERD (gastroesophageal reflux disease)    Hypertension    Impaired fasting glucose 10/24/2017   Mixed hyperlipidemia 10/24/2017   Multifocal pneumonia 12/30/2021   Rising PSA level 10/24/2017   Stroke [facility])     Patient Active Problem List   Diagnosis Date Noted   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/2024   Spastic hemiplegia affecting nondominant side (HCC) 07/29/2023   Encephalopathy acute 01/07/2022   Severe protein-calorie malnutrition (HCC) 01/07/2022   Physical deconditioning 01/07/2022   Diarrhea 01/07/2022   Pericardial effusion    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   Prostate abscess 12/30/2021   Acute kidney injury (HCC) 12/30/2021   Anemia due to GI blood loss    Acute respiratory failure with hypoxia (HCC)    Acute ischemic right MCA stroke (HCC) 12/25/2021   Sinus tachycardia    Chest pain in adult 10/24/2017   Mixed hyperlipidemia 10/24/2017   Rising PSA level 10/24/2017   Impaired fasting glucose 10/24/2017   Elevated BP without diagnosis of hypertension 10/24/2017    Past Surgical History:   Procedure Laterality Date   COLONOSCOPY WITH PROPOFOL  N/A 12/28/2021   Procedure: COLONOSCOPY WITH PROPOFOL ;  Surgeon: Eda Iha, MD;  Location: [facility] ENDOSCOPY;  Service: Gastroenterology;  Laterality: N/A;   COLONOSCOPY WITH PROPOFOL  N/A 12/29/2021   Procedure: COLONOSCOPY WITH PROPOFOL ;  Surgeon: Eda Iha, MD;  Location: [facility] ENDOSCOPY;  Service: Gastroenterology;  Laterality: N/A;   ESOPHAGOGASTRODUODENOSCOPY (EGD) WITH PROPOFOL  N/A 12/29/2021   Procedure: ESOPHAGOGASTRODUODENOSCOPY (EGD) WITH PROPOFOL ;  Surgeon: Eda Iha, MD;  Location: [facility] ENDOSCOPY;  Service: Gastroenterology;  Laterality: N/A;   ESOPHAGOGASTRODUODENOSCOPY (EGD) WITH PROPOFOL   01/05/2022   Procedure: ESOPHAGOGASTRODUODENOSCOPY (EGD) WITH PROPOFOL ;  Surgeon: Paola Dreama SAILOR, MD;  Location: [facility] ENDOSCOPY;  Service: General;;   IR CT HEAD LTD  12/25/2021   IR PERCUTANEOUS ART THROMBECTOMY/INFUSION INTRACRANIAL INC DIAG ANGIO  12/25/2021   PEG PLACEMENT N/A 01/05/2022   Procedure: PERCUTANEOUS ENDOSCOPIC GASTROSTOMY (PEG) PLACEMENT;  Surgeon: Paola Dreama SAILOR, MD;  Location: [facility] ENDOSCOPY;  Service: General;  Laterality: N/A;   RADIOLOGY WITH ANESTHESIA N/A 12/25/2021   Procedure: IR WITH ANESTHESIA;  Surgeon: Radiologist, Medication, MD;  Location: [facility] OR;  Service: Radiology;  Laterality: N/A;   TRANSURETHRAL RESECTION OF PROSTATE N/A 12/29/2021   Procedure: TRANSURETHRAL RESECTION OF THE PROSTATE (TURP);  Surgeon: Carolee Sherwood JONETTA DOUGLAS, MD;  Location: [facility] OR;  Service: Urology;  Laterality: N/A;       Home Medications    Prior to Admission medications  Medication Sig Start Date End Date Taking? Authorizing Provider  albuterol  (VENTOLIN  HFA) 108 (90 Base) MCG/ACT inhaler Inhale 1-2 puffs into the lungs every 6 (six) hours as needed for wheezing or shortness of breath. 04/30/24  Yes Curt Oatis E, PA-C  amoxicillin -clavulanate (AUGMENTIN ) 875-125 MG tablet Take 1 tablet by mouth 2 (two) times daily for 7 days.  04/30/24 05/07/24 Yes Kunta Hilleary E, PA-C  azithromycin  (ZITHROMAX ) 250 MG tablet Take 2 tablets (500 mg total) by mouth daily for 1 day, THEN 1 tablet (250 mg total) daily for 4 days. 04/30/24 05/05/24 Yes Aamir Mclinden E, PA-C  ascorbic acid (VITAMIN C) 500 MG tablet Take by mouth.    [provider]  aspirin EC 81 MG tablet Take by mouth.    [provider]  atorvastatin  (LIPITOR) 40 MG tablet Place 1 tablet (40 mg total) into feeding tube daily. 01/15/22   Tobie Yetta HERO, MD  azelastine  (ASTELIN ) 0.1 % nasal spray Place 1 spray into both nostrils 2 (two) times daily. Use in each nostril as directed 04/23/24   Reddick, Johnathan B, NP  Baclofen 5 MG TABS Take 5 mg by mouth 3 (three) times daily. 10/15/22   [provider]  diclofenac Sodium (VOLTAREN ARTHRITIS PAIN) 1 % GEL Apply topically daily.    [provider]  Lactobacillus-Inulin (CULTURELLE DIGESTIVE DAILY PO) Take by mouth daily.    [provider]  lisinopril  (ZESTRIL ) 10 MG tablet Place 1 tablet (10 mg total) into feeding tube daily. 01/15/22   Patel, Pranav M, MD  MELATONIN PO Take by mouth as needed.    [provider]  metoprolol tartrate (LOPRESSOR) 25 MG tablet Take 25 mg by mouth 2 (two) times daily. 02/28/22   [provider]  Multiple Vitamin (MULTI-VITAMIN) tablet Take 1 tablet by mouth daily.    [provider]  pantoprazole  sodium (PROTONIX ) 40 mg Place 40 mg into feeding tube 2 (two) times daily. 01/15/22   Tobie Yetta HERO, MD  promethazine -dextromethorphan (PROMETHAZINE -DM) 6.25-15 MG/5ML syrup Take 5 mLs by mouth 4 (four) times daily as needed for cough. 04/23/24   Reddick, Johnathan B, NP  Sennosides (SENOKOT PO) Take by mouth daily at 2 PM.    [provider]  tamsulosin (FLOMAX) 0.4 MG CAPS capsule Take by mouth. 02/28/22   [provider]    Family History Family History  Problem Relation Age of Onset   Stroke Neg Hx     Social  History Social History   Tobacco Use   Smoking status: Never    Passive exposure: Never   Smokeless tobacco: Never  Vaping Use   Vaping status: Never Used  Substance Use Topics   Alcohol use: Yes    Comment: occ   Drug use: Never     Allergies   Pollen extract and Shellfish allergy   Review of Systems Review of Systems  Constitutional:  Negative for chills and fever.  HENT:  Positive for congestion and rhinorrhea.   Respiratory:  Positive for cough and shortness of breath. Negative for wheezing.      Physical Exam Triage Vital Signs ED Triage Vitals  Encounter Vitals Group     BP 04/30/24 0944 103/70     Girls Systolic BP Percentile --      Girls Diastolic BP Percentile --      Boys Systolic BP Percentile --      Boys Diastolic BP Percentile --      Pulse Rate 04/30/24 0944 62  Resp 04/30/24 0944 17     Temp 04/30/24 0944 97.6 F (36.4 C)     Temp Source 04/30/24 0944 Oral     SpO2 04/30/24 0944 (S) 91 %     Weight 04/30/24 0947 220 lb (99.8 kg)     Height 04/30/24 0947 6' 2 (1.88 m)     Head Circumference --      Peak Flow --      Pain Score 04/30/24 0946 0     Pain Loc --      Pain Education --      Exclude from Growth Chart --    No data found.  Updated Vital Signs BP 103/70 (BP Location: Right Arm)   Pulse 62   Temp 97.6 F (36.4 C) (Oral)   Resp 17   Ht 6' 2 (1.88 m)   Wt 220 lb (99.8 kg)   SpO2 (S) 91%   BMI 28.25 kg/m   Visual Acuity Right Eye Distance:   Left Eye Distance:   Bilateral Distance:    Right Eye Near:   Left Eye Near:    Bilateral Near:     Physical Exam Vitals reviewed.  Constitutional:      General: He is awake. He is not in acute distress.    Appearance: Normal appearance. He is well-developed and well-groomed. He is not ill-appearing or toxic-appearing.  HENT:     Head: Normocephalic and atraumatic.  Eyes:     Extraocular Movements: Extraocular movements intact.     Conjunctiva/sclera: Conjunctivae  normal.  Cardiovascular:     Rate and Rhythm: Normal rate and regular rhythm.     Heart sounds: Normal heart sounds. No murmur heard.    No friction rub. No gallop.  Pulmonary:     Effort: Pulmonary effort is normal.     Breath sounds: Decreased air movement present. Examination of the right-lower field reveals decreased breath sounds. Examination of the left-lower field reveals decreased breath sounds. Decreased breath sounds present. No wheezing, rhonchi or rales.     Comments: Chest sounds tight and there is decreased air movement and decreased breath sounds in bilateral lower lung fields  Musculoskeletal:     Cervical back: Normal range of motion.  Neurological:     Mental Status: He is alert and oriented to person, place, and time.  Psychiatric:        Attention and Perception: Attention normal.        Mood and Affect: Mood normal.        Speech: Speech normal.        Behavior: Behavior normal. Behavior is cooperative.      UC Treatments / Results  Labs (all labs ordered are listed, but only abnormal results are displayed) Labs Reviewed - No data to display  EKG   Radiology DG Chest 2 View Result Date: 04/30/2024 CLINICAL DATA:  Decreased O2 sats with decreased breath sounds. EXAM: CHEST - 2 VIEW COMPARISON:  01/27/2022 FINDINGS: The lungs are clear without focal pneumonia, edema, pneumothorax or pleural effusion. The cardiopericardial silhouette is within normal limits for size. No acute bony abnormality. IMPRESSION: No active cardiopulmonary disease. Electronically Signed   By: Camellia Candle M.D.   On: 04/30/2024 10:22    Procedures Procedures (including critical care time)  Medications Ordered in UC Medications - No data to display  Initial Impression / Assessment and Plan / UC Course  I have reviewed the triage vital signs and the nursing notes.  Pertinent labs & imaging  results that were available during my care of the patient were reviewed by me and considered  in my medical decision making (see chart for details).      Final Clinical Impressions(s) / UC Diagnoses   Final diagnoses:  Decreased breath sounds of both lungs  Acute cough   Patient presents today with concerns for persistent, dry cough as well as nasal congestion and rhinorrhea.  He was previously seen for the same concern on 04/23/2024 and states that the medications he was prescribed at that appointment seem to be working until Saturday night.  He states that he started to have severe coughing to the point where he considered going to the emergency room.  Physical exam is notable for decreased air movement as well as decreased breath sounds in the lower lung fields.  Vitals are notable for low oxygen  saturation at 91% but patient does not appear to be in respiratory distress with no signs of tachypnea or difficulty breathing.  At this time I am concerned for potential community-acquired pneumonia, bronchitis, cough due to bronchospasm.  Chest x-ray was negative for signs of acute cardiopulmonary disease per radiology interpretation.  Given the fact that patient has reduced oxygen  saturation and severe coughing we will start a Z-Pak.  Given persistence of sinus symptoms we will go ahead and start Augmentin  as well.  These medications would also be sufficient for potential pneumonia should this start to develop.  Recommend that he continues to take over-the-counter medications as needed for further symptomatic relief.  Will send patient home with inhaler since he just completed a steroid and I did not wish to add further corticosteroid use.  ED and return precautions reviewed and provided in after visit summary.  Follow-up as needed.    Discharge Instructions      You are seen today for concerns of an ongoing, dry cough that does not seem to be responding well to medications that you are previously prescribed. Your x-ray appeared a bit hazy to me but radiology interpretation did not show signs  of acute pneumonia or acute cardiopulmonary illness. I have sent in a prescription for 2 antibiotics for you to take.  1 is called Augmentin  for you to take by mouth twice per day for 7 days.  The other is azithromycin , also known as a Z-Pak, for you to take by mouth per the instructions on the container.  I am also sending in an inhaler for you to use as needed up to every 6 hours for shortness of breath and coughing. In addition to these medications you can also take over-the-counter medications such as Robitussin, Mucinex , Tylenol  and ibuprofen as needed for symptom management. If you feel your symptoms are not improving or seeming worsening I recommend follow-up with your primary care provider or going to the ED if you are experiencing significant trouble breathing, chest pain, fever that is not responding to tylenol  or ibuprofen, coughing up blood, confusion, loss of consciousness.      ED Prescriptions     Medication Sig Dispense Auth. Provider   azithromycin  (ZITHROMAX ) 250 MG tablet Take 2 tablets (500 mg total) by mouth daily for 1 day, THEN 1 tablet (250 mg total) daily for 4 days. 6 each Romanita Fager E, PA-C   albuterol  (VENTOLIN  HFA) 108 (90 Base) MCG/ACT inhaler Inhale 1-2 puffs into the lungs every 6 (six) hours as needed for wheezing or shortness of breath. 6.7 g Marlise Fahr E, PA-C   amoxicillin -clavulanate (AUGMENTIN ) 875-125 MG tablet Take  1 tablet by mouth 2 (two) times daily for 7 days. 14 tablet Timohty Renbarger E, PA-C      PDMP not reviewed this encounter.   Marylene Rocky BRAVO, PA-C 05/01/24 [id number]

## 2024-05-15 NOTE — Therapy (Signed)
 OUTPATIENT OCCUPATIONAL THERAPY NEURO EVALUATION  Patient Name: Vincent Ferrell MRN: [id number] DOB:1960-12-12, 62 y.o., male Today's Date: 05/17/2024  PCP: Thelbert Eva SAUNDERS, DO  REFERRING PROVIDER: Carilyn Prentice BRAVO, MD  END OF SESSION:  OT End of Session - 05/17/24 1232     Visit Number 1    Number of Visits 8    Date for OT Re-Evaluation 07/17/24    Authorization Type Cigna/Cigna managed, VL: 90 PT/OT/SP    OT Start Time 0930    OT Stop Time 1015    OT Time Calculation (min) 45 min    Activity Tolerance Patient tolerated treatment well          Past Medical History:  Diagnosis Date   Chest pain in adult 10/24/2017   Elevated BP without diagnosis of hypertension 10/24/2017   GERD (gastroesophageal reflux disease)    Hypertension    Impaired fasting glucose 10/24/2017   Mixed hyperlipidemia 10/24/2017   Multifocal pneumonia 12/30/2021   Rising PSA level 10/24/2017   Stroke [facility])    Past Surgical History:  Procedure Laterality Date   COLONOSCOPY WITH PROPOFOL  N/A 12/28/2021   Procedure: COLONOSCOPY WITH PROPOFOL ;  Surgeon: Eda Iha, MD;  Location: [facility] ENDOSCOPY;  Service: Gastroenterology;  Laterality: N/A;   COLONOSCOPY WITH PROPOFOL  N/A 12/29/2021   Procedure: COLONOSCOPY WITH PROPOFOL ;  Surgeon: Eda Iha, MD;  Location: [facility] ENDOSCOPY;  Service: Gastroenterology;  Laterality: N/A;   ESOPHAGOGASTRODUODENOSCOPY (EGD) WITH PROPOFOL  N/A 12/29/2021   Procedure: ESOPHAGOGASTRODUODENOSCOPY (EGD) WITH PROPOFOL ;  Surgeon: Eda Iha, MD;  Location: [facility] ENDOSCOPY;  Service: Gastroenterology;  Laterality: N/A;   ESOPHAGOGASTRODUODENOSCOPY (EGD) WITH PROPOFOL   01/05/2022   Procedure: ESOPHAGOGASTRODUODENOSCOPY (EGD) WITH PROPOFOL ;  Surgeon: Paola Dreama SAILOR, MD;  Location: [facility] ENDOSCOPY;  Service: General;;   IR CT HEAD LTD  12/25/2021   IR PERCUTANEOUS ART THROMBECTOMY/INFUSION INTRACRANIAL INC DIAG ANGIO  12/25/2021   PEG PLACEMENT N/A 01/05/2022   Procedure:  PERCUTANEOUS ENDOSCOPIC GASTROSTOMY (PEG) PLACEMENT;  Surgeon: Paola Dreama SAILOR, MD;  Location: [facility] ENDOSCOPY;  Service: General;  Laterality: N/A;   RADIOLOGY WITH ANESTHESIA N/A 12/25/2021   Procedure: IR WITH ANESTHESIA;  Surgeon: Radiologist, Medication, MD;  Location: [facility] OR;  Service: Radiology;  Laterality: N/A;   TRANSURETHRAL RESECTION OF PROSTATE N/A 12/29/2021   Procedure: TRANSURETHRAL RESECTION OF THE PROSTATE (TURP);  Surgeon: Carolee Sherwood JONETTA DOUGLAS, MD;  Location: [facility] OR;  Service: Urology;  Laterality: N/A;   Patient Active Problem List   Diagnosis Date Noted   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/2024   Spastic hemiplegia affecting nondominant side (HCC) 07/29/2023   Encephalopathy acute 01/07/2022   Severe protein-calorie malnutrition (HCC) 01/07/2022   Physical deconditioning 01/07/2022   Diarrhea 01/07/2022   Pericardial effusion    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   Prostate abscess 12/30/2021   Acute kidney injury (HCC) 12/30/2021   Anemia due to GI blood loss    Acute respiratory failure with hypoxia (HCC)    Acute ischemic right MCA stroke (HCC) 12/25/2021   Sinus tachycardia    Chest pain in adult 10/24/2017   Mixed hyperlipidemia 10/24/2017   Rising PSA level 10/24/2017   Impaired fasting glucose 10/24/2017   Elevated BP without diagnosis of hypertension 10/24/2017    ONSET DATE: 04/20/2024 (referral date)   REFERRING DIAG: G81.10 (ICD-10-CM) - Spastic hemiplegia affecting nondominant side (HCC)  Recent botox  on 04/20/24:  FDS 50 FDP 50 PT 50 FCR 50  THERAPY DIAG:  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 No  FALLS: Has patient fallen in last 6 months? 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  HAND DOMINANCE: Right  ADLs: Overall ADLs: Mod I, slip on shoes Transfers/ambulation related to ADLs: independent  IADLs: Shopping: mod I  Light housekeeping: has cleaning lady 2x/wk for heavy cleaning (changes sheets, vacuum)  Meal Prep: mod I - microwave, some light cooking (may need A/E for safety)  Community mobility: driving Medication management: mod I using pillbox Financial management: mod I  Handwriting: no changes  MOBILITY STATUS: Independent  POSTURE COMMENTS:  No Significant postural limitations   UPPER EXTREMITY ROM:  RUE AROM WNL's LUE: shoulder flexion approx 75% limited end range, 90% elbow, 75% supination, wrist ext approx 50*. Pt remains in PIP flexion and only has extension at MP's.    HAND FUNCTION: Grip strength: Right: 79.5 lbs; Left: 4.4 lbs (assist placing hand around dynamometer)   COORDINATION: Box and Blocks:  Left unable   SENSATION: Can detect stimuli but unable to localize Lt hand.   EDEMA: moderate Lt forearm and minimal Lt hand - pt reports may be due to prednisone  from upper respiratory infection (bronchitis)  MUSCLE TONE: LUE: Mild and Hypertonic  COGNITION: Overall cognitive status: Within functional limits for tasks assessed  VISION: Subjective report: mild Lt visual field from stroke Baseline vision: Wears glasses all the time Visual history: none   PERCEPTION: Not tested  PRAXIS: Not tested  OBSERVATIONS: Pt slightly tighter Lt hand than last  episode of care, Pt also has new edema in forearm most likely due to prednisone  d/t recent URI                                                                                                                             TREATMENT DATE: 05/17/24    Reviewed O.T. findings and goals. Pt in agreement with POC     PATIENT EDUCATION: Education details: SEE ABOVE Person educated: Patient Education method: Explanation Education comprehension: verbalized understanding  HOME EXERCISE PROGRAM: N/A   GOALS: Goals reviewed with patient? Yes  SHORT TERM GOALS: Target date: 06/16/24  Make adjustments to current resting hand splint prn (placing in MP flex, IP ext) Baseline: Goal status: INITIAL  2.  Independent with daytime splint prn (consider thumb splint for greater palmer abd and  either dorsal finger splint or Oval 8 for PIP ext)  Baseline:  Goal status: INITIAL  LONG TERM GOALS: Target date: 07/17/24  Independent with revised/updated HEP prn from last episode of care Baseline:  Goal status: INITIAL  2.  Pt to achieve 2 blocks on Box & Blocks LUE Baseline: Unable, but could perform 2 last episode of care Goal status: INITIAL  3.  Pt to verbalize understanding of A/E to increase safety and ease with cooking tasks  Baseline:  Goal status: INITIAL   ASSESSMENT:  CLINICAL IMPRESSION: Patient is a 63 y.o. male who was seen today for occupational therapy evaluation for Spastic hemiplegia from CVA in 2023.  Patient currently presents below baseline level of functioning demonstrating functional deficits and impairments as noted below. Pt would benefit from skilled OT services in the outpatient setting to work on impairments as noted below to help pt return to PLOF as able.   SABRA   PERFORMANCE DEFICITS: in functional skills including ADLs, IADLs, coordination, proprioception, sensation, edema, ROM, strength, pain, Fine motor control, Gross motor control, body mechanics, decreased  knowledge of use of DME, and UE functional use.   IMPAIRMENTS: are limiting patient from ADLs, IADLs, leisure, and social participation.   CO-MORBIDITIES: may have co-morbidities  that affects occupational performance. Patient will benefit from skilled OT to address above impairments and improve overall function.  MODIFICATION OR ASSISTANCE TO COMPLETE EVALUATION: No modification of tasks or assist necessary to complete an evaluation.  OT OCCUPATIONAL PROFILE AND HISTORY: Problem focused assessment: Including review of records relating to presenting problem.  CLINICAL DECISION MAKING: LOW - limited treatment options, no task modification necessary  REHAB POTENTIAL: Good  EVALUATION COMPLEXITY: Low    PLAN:  OT FREQUENCY: 1x/week  OT DURATION: 8 weeks  PLANNED INTERVENTIONS: 97535 self care/ADL training, 02889 therapeutic exercise, 97530 therapeutic activity, 97112 neuromuscular re-education, 97140 manual therapy, 97018 paraffin, 02960 fluidotherapy, 97010 moist heat, 97032 electrical stimulation (manual), 97014 electrical stimulation unattended, 97760 Orthotic Initial, 97763 Orthotic/Prosthetic subsequent, patient/family education, and DME and/or AE instructions  RECOMMENDED OTHER SERVICES: none at this time  CONSULTED AND AGREED WITH PLAN OF CARE: Patient  PLAN FOR NEXT SESSION: Pt to bring in resting hand splint to see if further adjustments need to be made (? increase MP flex, IP ext position) and if any new straps and hooks need to be applied. Pt also to bring in estim device to see if some PIP extension can be achieved    Burnard JINNY Roads, OT 05/17/2024, 12:34 PM

## 2024-05-17 ENCOUNTER — Ambulatory Visit: Attending: Physical Medicine & Rehabilitation | Admitting: Occupational Therapy

## 2024-05-17 DIAGNOSIS — R278 Other lack of coordination: Secondary | ICD-10-CM | POA: Diagnosis present

## 2024-05-17 DIAGNOSIS — R29818 Other symptoms and signs involving the nervous system: Secondary | ICD-10-CM | POA: Insufficient documentation

## 2024-05-17 DIAGNOSIS — M6281 Muscle weakness (generalized): Secondary | ICD-10-CM | POA: Diagnosis present

## 2024-05-17 DIAGNOSIS — G8110 Spastic hemiplegia affecting unspecified side: Secondary | ICD-10-CM | POA: Diagnosis not present

## 2024-05-17 DIAGNOSIS — I69354 Hemiplegia and hemiparesis following cerebral infarction affecting left non-dominant side: Secondary | ICD-10-CM | POA: Insufficient documentation

## 2024-05-17 DIAGNOSIS — R208 Other disturbances of skin sensation: Secondary | ICD-10-CM | POA: Diagnosis present

## 2024-05-31 ENCOUNTER — Ambulatory Visit: Admitting: Occupational Therapy

## 2024-05-31 ENCOUNTER — Encounter: Payer: Self-pay | Admitting: Occupational Therapy

## 2024-05-31 DIAGNOSIS — I69354 Hemiplegia and hemiparesis following cerebral infarction affecting left non-dominant side: Secondary | ICD-10-CM | POA: Diagnosis not present

## 2024-05-31 DIAGNOSIS — R208 Other disturbances of skin sensation: Secondary | ICD-10-CM

## 2024-05-31 DIAGNOSIS — M6281 Muscle weakness (generalized): Secondary | ICD-10-CM

## 2024-05-31 DIAGNOSIS — R278 Other lack of coordination: Secondary | ICD-10-CM

## 2024-05-31 DIAGNOSIS — R29818 Other symptoms and signs involving the nervous system: Secondary | ICD-10-CM

## 2024-05-31 NOTE — Therapy (Signed)
 OUTPATIENT OCCUPATIONAL THERAPY NEURO TREATMENT  Patient Name: Vincent Ferrell MRN: [id number] DOB:27-May-1961, 63 y.o., male Today's Date: 05/31/2024  PCP: Thelbert Eva SAUNDERS, DO  REFERRING PROVIDER: Carilyn Prentice BRAVO, MD  END OF SESSION:  OT End of Session - 05/31/24 1104     Visit Number 2    Number of Visits 8    Date for Recertification  07/17/24    Authorization Type Cigna/Cigna managed, VL: 90 PT/OT/SP    OT Start Time 1103    OT Stop Time 1145    OT Time Calculation (min) 42 min    Activity Tolerance Patient tolerated treatment well          Past Medical History:  Diagnosis Date   Chest pain in adult 10/24/2017   Elevated BP without diagnosis of hypertension 10/24/2017   GERD (gastroesophageal reflux disease)    Hypertension    Impaired fasting glucose 10/24/2017   Mixed hyperlipidemia 10/24/2017   Multifocal pneumonia 12/30/2021   Rising PSA level 10/24/2017   Stroke [facility])    Past Surgical History:  Procedure Laterality Date   COLONOSCOPY WITH PROPOFOL  N/A 12/28/2021   Procedure: COLONOSCOPY WITH PROPOFOL ;  Surgeon: Eda Iha, MD;  Location: [facility] ENDOSCOPY;  Service: Gastroenterology;  Laterality: N/A;   COLONOSCOPY WITH PROPOFOL  N/A 12/29/2021   Procedure: COLONOSCOPY WITH PROPOFOL ;  Surgeon: Eda Iha, MD;  Location: [facility] ENDOSCOPY;  Service: Gastroenterology;  Laterality: N/A;   ESOPHAGOGASTRODUODENOSCOPY (EGD) WITH PROPOFOL  N/A 12/29/2021   Procedure: ESOPHAGOGASTRODUODENOSCOPY (EGD) WITH PROPOFOL ;  Surgeon: Eda Iha, MD;  Location: [facility] ENDOSCOPY;  Service: Gastroenterology;  Laterality: N/A;   ESOPHAGOGASTRODUODENOSCOPY (EGD) WITH PROPOFOL   01/05/2022   Procedure: ESOPHAGOGASTRODUODENOSCOPY (EGD) WITH PROPOFOL ;  Surgeon: Paola Dreama SAILOR, MD;  Location: [facility] ENDOSCOPY;  Service: General;;   IR CT HEAD LTD  12/25/2021   IR PERCUTANEOUS ART THROMBECTOMY/INFUSION INTRACRANIAL INC DIAG ANGIO  12/25/2021   PEG PLACEMENT N/A 01/05/2022   Procedure:  PERCUTANEOUS ENDOSCOPIC GASTROSTOMY (PEG) PLACEMENT;  Surgeon: Paola Dreama SAILOR, MD;  Location: [facility] ENDOSCOPY;  Service: General;  Laterality: N/A;   RADIOLOGY WITH ANESTHESIA N/A 12/25/2021   Procedure: IR WITH ANESTHESIA;  Surgeon: Radiologist, Medication, MD;  Location: [facility] OR;  Service: Radiology;  Laterality: N/A;   TRANSURETHRAL RESECTION OF PROSTATE N/A 12/29/2021   Procedure: TRANSURETHRAL RESECTION OF THE PROSTATE (TURP);  Surgeon: Carolee Sherwood JONETTA DOUGLAS, MD;  Location: [facility] OR;  Service: Urology;  Laterality: N/A;   Patient Active Problem List   Diagnosis Date Noted   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/2024   Spastic hemiplegia affecting nondominant side (HCC) 07/29/2023   Encephalopathy acute 01/07/2022   Severe protein-calorie malnutrition (HCC) 01/07/2022   Physical deconditioning 01/07/2022   Diarrhea 01/07/2022   Pericardial effusion    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   Prostate abscess 12/30/2021   Acute kidney injury (HCC) 12/30/2021   Anemia due to GI blood loss    Acute respiratory failure with hypoxia (HCC)    Acute ischemic right MCA stroke (HCC) 12/25/2021   Sinus tachycardia    Chest pain in adult 10/24/2017   Mixed hyperlipidemia 10/24/2017   Rising PSA level 10/24/2017   Impaired fasting glucose 10/24/2017   Elevated BP without diagnosis of hypertension 10/24/2017    ONSET DATE: 04/20/2024 (referral date)   REFERRING DIAG: G81.10 (ICD-10-CM) - Spastic hemiplegia affecting nondominant side (HCC)  Recent botox  on 04/20/24:  FDS 50 FDP 50 PT 50 FCR 50  THERAPY DIAG:  Spastic hemiplegia of left nondominant side as late effect of cerebral  infarction [facility])  Muscle weakness (generalized)  Other disturbances of skin sensation  Other symptoms and signs involving the nervous system  Other lack of coordination  Rationale for Evaluation and Treatment: Rehabilitation  SUBJECTIVE:   SUBJECTIVE STATEMENT: I slept funny last night and my neck hurts a  little 1/10. I need to change the 9th and 16th appts Pt accompanied by: self  PERTINENT HISTORY: CVA 12/25/2021, endocarditis, HTN, HLD   PRECAUTIONS: None  WEIGHT BEARING RESTRICTIONS: No  PAIN:  Are you having pain? No  FALLS: Has patient fallen in last 6 months? 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  HAND DOMINANCE: Right  ADLs: Overall ADLs: Mod I, slip on shoes Transfers/ambulation related to ADLs: independent  IADLs: Shopping: mod I  Light housekeeping: has cleaning lady 2x/wk for heavy cleaning (changes sheets, vacuum)  Meal Prep: mod I - microwave, some light cooking (may need A/E for safety)  Community mobility: driving Medication management: mod I using pillbox Financial management: mod I  Handwriting: no changes  MOBILITY STATUS: Independent  POSTURE COMMENTS:  No Significant postural limitations   UPPER EXTREMITY ROM:  RUE AROM WNL's LUE: shoulder flexion approx 75% limited end range, 90% elbow, 75% supination, wrist ext approx 50*. Pt remains in PIP flexion and only has extension at MP's.    HAND FUNCTION: Grip strength: Right: 79.5 lbs; Left: 4.4 lbs (assist placing hand around dynamometer)   COORDINATION: Box and Blocks:  Left unable   SENSATION: Can detect stimuli but unable to localize Lt hand.   EDEMA: moderate Lt forearm and minimal Lt hand - pt reports may be due to prednisone  from upper respiratory infection (bronchitis)  MUSCLE TONE: LUE: Mild and Hypertonic  COGNITION: Overall cognitive status: Within functional limits for tasks assessed  VISION: Subjective report: mild Lt visual field from stroke Baseline vision:  Wears glasses all the time Visual history: none   PERCEPTION: Not tested  PRAXIS: Not tested  OBSERVATIONS: Pt slightly tighter Lt hand than last episode of care, Pt also has new edema in forearm most likely due to prednisone  d/t recent URI                                                                                                                             TREATMENT DATE: 05/31/24    Pt had to cancel 2 appointments d/t conflicts and being out of town. Therapist assisted w/ rescheduling these  Assessed current resting hand splint - splint still fitting well with no adjustments required, however pt did have some straps on incorrectly and reviewed proper strap placement and proper donning/doffing. Also provided 2 extra straps. Hooks remain  intact  Pt also brought in home estim device - therapist adjusted and attempted to achieve more PIP extension w/ adjustments to settings and electrode placement, but was only able to achieve MP extension/hyperextension w/ further PIP flexion, therefore recommended pt d/c using estim device.   Discussed plan going forward including goals and plan for next session     PATIENT EDUCATION: Education details: SEE ABOVE Person educated: Patient Education method: Explanation Education comprehension: verbalized understanding  HOME EXERCISE PROGRAM: N/A   GOALS: Goals reviewed with patient? Yes  SHORT TERM GOALS: Target date: 06/16/24  Make adjustments to current resting hand splint prn (placing in MP flex, IP ext) Baseline: Goal status: MET   2.  Independent with daytime splint prn (consider thumb splint for greater palmer abd and either dorsal finger splint or Oval 8 for PIP ext)  Baseline:  Goal status: INITIAL  LONG TERM GOALS: Target date: 07/17/24  Independent with revised/updated HEP prn from last episode of care Baseline:  Goal status: INITIAL  2.  Pt to achieve 2 blocks on Box & Blocks LUE Baseline: Unable, but could perform 2  last episode of care Goal status: INITIAL  3.  Pt to verbalize understanding of A/E to increase safety and ease with cooking tasks  Baseline:  Goal status: INITIAL   ASSESSMENT:  CLINICAL IMPRESSION: Patient seen today for occupational therapy treatment for Spastic hemiplegia from CVA in 2023.  Current resting hand splint still fits well but pt benefited from review of strap placement, donning/doffing, and wear and care. Patient currently presents below baseline level of functioning demonstrating functional deficits and impairments as noted below. Pt would benefit from skilled OT services in the outpatient setting to work on impairments as noted below to help pt return to PLOF as able.   SABRA   PERFORMANCE DEFICITS: in functional skills including ADLs, IADLs, coordination, proprioception, sensation, edema, ROM, strength, pain, Fine motor control, Gross motor control, body mechanics, decreased knowledge of use of DME, and UE functional use.   IMPAIRMENTS: are limiting patient from ADLs, IADLs, leisure, and social participation.   CO-MORBIDITIES: may have co-morbidities  that affects occupational performance. Patient will benefit from skilled OT to address above impairments and improve overall function.  MODIFICATION OR ASSISTANCE TO COMPLETE EVALUATION: No modification of tasks or assist necessary to complete an evaluation.  OT OCCUPATIONAL PROFILE AND HISTORY: Problem focused assessment: Including review of records relating to presenting problem.  CLINICAL DECISION MAKING: LOW - limited treatment options, no task modification necessary  REHAB POTENTIAL: Good  EVALUATION COMPLEXITY: Low    PLAN:  OT FREQUENCY: 1x/week  OT DURATION: 8 weeks  PLANNED INTERVENTIONS: 97535 self care/ADL training, 02889 therapeutic exercise, 97530 therapeutic activity, 97112 neuromuscular re-education, 97140 manual therapy, 97018 paraffin, 02960 fluidotherapy, 97010 moist heat, 97032 electrical  stimulation (manual), 97014 electrical stimulation unattended, 97760 Orthotic Initial, 97763 Orthotic/Prosthetic subsequent, patient/family education, and DME and/or AE instructions  RECOMMENDED OTHER SERVICES: none at this time  CONSULTED AND AGREED WITH PLAN OF CARE: Patient  PLAN FOR NEXT SESSION: fabricate thumb spica splint for daytime to to see if more functional grasp can be achieved. ? Oval 8's for index, long, and possible ring fingers for PIP extension   Burnard JINNY Roads, OT 05/31/2024, 11:06 AM

## 2024-06-07 ENCOUNTER — Ambulatory Visit: Admitting: Occupational Therapy

## 2024-06-07 ENCOUNTER — Encounter: Payer: Self-pay | Admitting: Occupational Therapy

## 2024-06-07 DIAGNOSIS — R29818 Other symptoms and signs involving the nervous system: Secondary | ICD-10-CM

## 2024-06-07 DIAGNOSIS — R278 Other lack of coordination: Secondary | ICD-10-CM

## 2024-06-07 DIAGNOSIS — I69354 Hemiplegia and hemiparesis following cerebral infarction affecting left non-dominant side: Secondary | ICD-10-CM

## 2024-06-07 DIAGNOSIS — M6281 Muscle weakness (generalized): Secondary | ICD-10-CM

## 2024-06-07 DIAGNOSIS — R208 Other disturbances of skin sensation: Secondary | ICD-10-CM

## 2024-06-07 NOTE — Therapy (Signed)
 OUTPATIENT OCCUPATIONAL THERAPY NEURO TREATMENT  Patient Name: Vincent Ferrell MRN: [id number] DOB:Jul 09, 1961, 62 y.o., male Today's Date: 06/07/2024  PCP: Thelbert Eva SAUNDERS, DO  REFERRING PROVIDER: Carilyn Prentice BRAVO, MD  END OF SESSION:  OT End of Session - 06/07/24 1021     Visit Number 3    Number of Visits 8    Date for Recertification  07/17/24    Authorization Type Cigna/Cigna managed, VL: 90 PT/OT/SP    OT Start Time 1020    OT Stop Time 1100    OT Time Calculation (min) 40 min    Activity Tolerance Patient tolerated treatment well          Past Medical History:  Diagnosis Date   Chest pain in adult 10/24/2017   Elevated BP without diagnosis of hypertension 10/24/2017   GERD (gastroesophageal reflux disease)    Hypertension    Impaired fasting glucose 10/24/2017   Mixed hyperlipidemia 10/24/2017   Multifocal pneumonia 12/30/2021   Rising PSA level 10/24/2017   Stroke [facility])    Past Surgical History:  Procedure Laterality Date   COLONOSCOPY WITH PROPOFOL  N/A 12/28/2021   Procedure: COLONOSCOPY WITH PROPOFOL ;  Surgeon: Eda Iha, MD;  Location: [facility] ENDOSCOPY;  Service: Gastroenterology;  Laterality: N/A;   COLONOSCOPY WITH PROPOFOL  N/A 12/29/2021   Procedure: COLONOSCOPY WITH PROPOFOL ;  Surgeon: Eda Iha, MD;  Location: [facility] ENDOSCOPY;  Service: Gastroenterology;  Laterality: N/A;   ESOPHAGOGASTRODUODENOSCOPY (EGD) WITH PROPOFOL  N/A 12/29/2021   Procedure: ESOPHAGOGASTRODUODENOSCOPY (EGD) WITH PROPOFOL ;  Surgeon: Eda Iha, MD;  Location: [facility] ENDOSCOPY;  Service: Gastroenterology;  Laterality: N/A;   ESOPHAGOGASTRODUODENOSCOPY (EGD) WITH PROPOFOL   01/05/2022   Procedure: ESOPHAGOGASTRODUODENOSCOPY (EGD) WITH PROPOFOL ;  Surgeon: Paola Dreama SAILOR, MD;  Location: [facility] ENDOSCOPY;  Service: General;;   IR CT HEAD LTD  12/25/2021   IR PERCUTANEOUS ART THROMBECTOMY/INFUSION INTRACRANIAL INC DIAG ANGIO  12/25/2021   PEG PLACEMENT N/A 01/05/2022   Procedure:  PERCUTANEOUS ENDOSCOPIC GASTROSTOMY (PEG) PLACEMENT;  Surgeon: Paola Dreama SAILOR, MD;  Location: [facility] ENDOSCOPY;  Service: General;  Laterality: N/A;   RADIOLOGY WITH ANESTHESIA N/A 12/25/2021   Procedure: IR WITH ANESTHESIA;  Surgeon: Radiologist, Medication, MD;  Location: [facility] OR;  Service: Radiology;  Laterality: N/A;   TRANSURETHRAL RESECTION OF PROSTATE N/A 12/29/2021   Procedure: TRANSURETHRAL RESECTION OF THE PROSTATE (TURP);  Surgeon: Carolee Sherwood JONETTA DOUGLAS, MD;  Location: [facility] OR;  Service: Urology;  Laterality: N/A;   Patient Active Problem List   Diagnosis Date Noted   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/2024   Spastic hemiplegia affecting nondominant side (HCC) 07/29/2023   Encephalopathy acute 01/07/2022   Severe protein-calorie malnutrition 01/07/2022   Physical deconditioning 01/07/2022   Diarrhea 01/07/2022   Pericardial effusion    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   Prostate abscess 12/30/2021   Acute kidney injury 12/30/2021   Anemia due to GI blood loss    Acute respiratory failure with hypoxia (HCC)    Acute ischemic right MCA stroke (HCC) 12/25/2021   Sinus tachycardia    Chest pain in adult 10/24/2017   Mixed hyperlipidemia 10/24/2017   Rising PSA level 10/24/2017   Impaired fasting glucose 10/24/2017   Elevated BP without diagnosis of hypertension 10/24/2017    ONSET DATE: 04/20/2024 (referral date)   REFERRING DIAG: G81.10 (ICD-10-CM) - Spastic hemiplegia affecting nondominant side (HCC)  Recent botox  on 04/20/24:  FDS 50 FDP 50 PT 50 FCR 50  THERAPY DIAG:  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 No  FALLS: Has patient fallen in last 6 months? 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  HAND DOMINANCE: Right  ADLs: Overall ADLs: Mod I, slip on shoes Transfers/ambulation related to ADLs: independent  IADLs: Shopping: mod I  Light housekeeping: has cleaning lady 2x/wk for heavy cleaning (changes sheets, vacuum)  Meal Prep: mod I - microwave, some light cooking (may need A/E for safety)  Community mobility: driving Medication management: mod I using pillbox Financial management: mod I  Handwriting: no changes  MOBILITY STATUS: Independent  POSTURE COMMENTS:  No Significant postural limitations   UPPER EXTREMITY ROM:  RUE AROM WNL's LUE: shoulder flexion approx 75% limited end range, 90% elbow, 75% supination, wrist ext approx 50*. Pt remains in PIP flexion and only has extension at MP's.    HAND FUNCTION: Grip strength: Right: 79.5 lbs; Left: 4.4 lbs (assist placing hand around dynamometer)   COORDINATION: Box and Blocks:  Left unable   SENSATION: Can detect stimuli but unable to localize Lt hand.   EDEMA: moderate Lt forearm and minimal Lt hand - pt reports may be due to prednisone  from upper respiratory infection (bronchitis)  MUSCLE TONE: LUE: Mild and Hypertonic  COGNITION: Overall cognitive status: Within functional limits for tasks assessed  VISION: Subjective report: mild Lt visual field from stroke Baseline vision: Wears glasses all the time Visual history: none   PERCEPTION: Not tested  PRAXIS: Not  tested  OBSERVATIONS: Pt slightly tighter Lt hand than last episode of care, Pt also has new edema in forearm most likely due to prednisone  d/t recent URI                                                                                                                             TREATMENT DATE: 06/07/24    Fabricated and fitted thumb spica splint for Lt hand to increase palmer abduction of thumb in prep for simple gross grasping. Pt educated on wear and care including proper donning/doffing. Pt instructed only to wear for some light activities during the day. Pt return demo of donning/doffing. Issued thumb spica splint   Pt also fitted for Oval 8 finger splints for Lt index (size 13) and long finger (size 14)  PIP extension to allow  for grasping cylindrical objects around object (vs top of object) and practiced grasping cones, cup, and 1 block w/ greater success using thumb spica splint and Oval 8's. However, pt needs more practice to correctly don/doff Oval 8's w/ one hand therefore therapist did not issue this session    PATIENT EDUCATION: Education details: SEE ABOVE Person educated: Patient Education method: Explanation Education comprehension: verbalized understanding  HOME EXERCISE PROGRAM: N/A   GOALS: Goals reviewed with patient? Yes  SHORT TERM GOALS: Target date: 06/16/24  Make adjustments to current resting hand splint prn (placing in MP flex, IP ext) Baseline: Goal status: MET   2.  Independent with daytime splint prn (consider thumb splint for greater palmer abd and either dorsal finger splint or Oval 8 for PIP ext)  Baseline:  Goal status: IN PROGRESS  LONG TERM GOALS: Target date: 07/17/24  Independent with revised/updated HEP prn from last episode of care Baseline:  Goal status: INITIAL  2.  Pt to achieve 2 blocks on Box & Blocks LUE Baseline: Unable, but could perform 2 last episode of care Goal status: INITIAL  3.  Pt to verbalize understanding of A/E to  increase safety and ease with cooking tasks  Baseline:  Goal status: INITIAL   ASSESSMENT:  CLINICAL IMPRESSION: Patient seen today for occupational therapy treatment for Spastic hemiplegia from CVA in 2023.  Pt has met STG #1 and progressing to STG #2. Patient currently presents below baseline level of functioning demonstrating functional deficits and impairments as noted below. Pt would benefit from skilled OT services in the outpatient setting to work on impairments as noted below to help pt return to PLOF as able.   SABRA   PERFORMANCE DEFICITS: in functional skills including ADLs, IADLs, coordination, proprioception, sensation, edema, ROM, strength, pain, Fine motor control, Gross motor control, body mechanics, decreased knowledge of use of DME, and UE functional use.   IMPAIRMENTS: are limiting patient from ADLs, IADLs, leisure, and social participation.   CO-MORBIDITIES: may have co-morbidities  that affects occupational performance. Patient will benefit from skilled OT to address above impairments and improve overall function.  MODIFICATION OR ASSISTANCE TO COMPLETE EVALUATION: No modification of tasks or assist necessary to complete an evaluation.  OT OCCUPATIONAL PROFILE AND HISTORY: Problem focused assessment: Including review of records relating to presenting problem.  CLINICAL DECISION MAKING: LOW - limited treatment options, no task modification necessary  REHAB POTENTIAL: Good  EVALUATION COMPLEXITY: Low    PLAN:  OT FREQUENCY: 1x/week  OT DURATION: 8 weeks  PLANNED INTERVENTIONS: 97535 self care/ADL training, 02889 therapeutic exercise, 97530 therapeutic activity, 97112 neuromuscular re-education, 97140 manual therapy, 97018 paraffin, 02960 fluidotherapy, 97010 moist heat, 97032 electrical stimulation (manual), 97014 electrical stimulation unattended, 97760 Orthotic Initial, 97763 Orthotic/Prosthetic subsequent, patient/family education, and DME and/or AE  instructions  RECOMMENDED OTHER SERVICES: none at this time  CONSULTED AND AGREED WITH PLAN OF CARE: Patient  PLAN FOR NEXT SESSION: practice donning/doffing Oval 8's and more practice w/ simple gross grasp/release Lt hand, HEP update/review prn    Burnard JINNY Roads, OT 06/07/2024, 10:22 AM

## 2024-06-14 ENCOUNTER — Ambulatory Visit: Admitting: Occupational Therapy

## 2024-06-18 ENCOUNTER — Encounter: Payer: Self-pay | Admitting: Occupational Therapy

## 2024-06-18 ENCOUNTER — Ambulatory Visit: Payer: Self-pay | Attending: Physical Medicine & Rehabilitation | Admitting: Occupational Therapy

## 2024-06-18 DIAGNOSIS — R278 Other lack of coordination: Secondary | ICD-10-CM | POA: Diagnosis present

## 2024-06-18 DIAGNOSIS — R208 Other disturbances of skin sensation: Secondary | ICD-10-CM | POA: Insufficient documentation

## 2024-06-18 DIAGNOSIS — R29818 Other symptoms and signs involving the nervous system: Secondary | ICD-10-CM | POA: Diagnosis present

## 2024-06-18 DIAGNOSIS — M6281 Muscle weakness (generalized): Secondary | ICD-10-CM | POA: Insufficient documentation

## 2024-06-18 DIAGNOSIS — I69354 Hemiplegia and hemiparesis following cerebral infarction affecting left non-dominant side: Secondary | ICD-10-CM | POA: Insufficient documentation

## 2024-06-18 NOTE — Patient Instructions (Signed)
 SELF ASSISTED: Finger Extension    Use one hand to gently open fingers on other hand and hold 10 sec.. DO NOT HYPEREXTEND BIG KNUCKLES._5__ reps per set, __3_ sets per day

## 2024-06-18 NOTE — Therapy (Signed)
 OUTPATIENT OCCUPATIONAL THERAPY NEURO TREATMENT  Patient Name: Tafari Humiston MRN: [id number] DOB:[birth year], 63 y.o., male Today's Date: 06/18/2024  PCP: Thelbert Eva SAUNDERS, DO  REFERRING PROVIDER: Carilyn Prentice BRAVO, MD  END OF SESSION:  OT End of Session - 06/18/24 1103     Visit Number 4    Number of Visits 8    Date for Recertification  07/17/24    Authorization Type MCR now primary, Cigna/Cigna managed, VL: 90 PT/OT/SP    Progress Note Due on Visit 10    OT Start Time 1102    OT Stop Time 1145    OT Time Calculation (min) 43 min    Activity Tolerance Patient tolerated treatment well          Past Medical History:  Diagnosis Date   Chest pain in adult 10/24/2017   Elevated BP without diagnosis of hypertension 10/24/2017   GERD (gastroesophageal reflux disease)    Hypertension    Impaired fasting glucose 10/24/2017   Mixed hyperlipidemia 10/24/2017   Multifocal pneumonia 12/30/2021   Rising PSA level 10/24/2017   Stroke [facility])    Past Surgical History:  Procedure Laterality Date   COLONOSCOPY WITH PROPOFOL  N/A 12/28/2021   Procedure: COLONOSCOPY WITH PROPOFOL ;  Surgeon: Eda Iha, MD;  Location: [facility] ENDOSCOPY;  Service: Gastroenterology;  Laterality: N/A;   COLONOSCOPY WITH PROPOFOL  N/A 12/29/2021   Procedure: COLONOSCOPY WITH PROPOFOL ;  Surgeon: Eda Iha, MD;  Location: [facility] ENDOSCOPY;  Service: Gastroenterology;  Laterality: N/A;   ESOPHAGOGASTRODUODENOSCOPY (EGD) WITH PROPOFOL  N/A 12/29/2021   Procedure: ESOPHAGOGASTRODUODENOSCOPY (EGD) WITH PROPOFOL ;  Surgeon: Eda Iha, MD;  Location: [facility] ENDOSCOPY;  Service: Gastroenterology;  Laterality: N/A;   ESOPHAGOGASTRODUODENOSCOPY (EGD) WITH PROPOFOL   01/05/2022   Procedure: ESOPHAGOGASTRODUODENOSCOPY (EGD) WITH PROPOFOL ;  Surgeon: Paola Dreama SAILOR, MD;  Location: [facility] ENDOSCOPY;  Service: General;;   IR CT HEAD LTD  12/25/2021   IR PERCUTANEOUS ART THROMBECTOMY/INFUSION INTRACRANIAL INC DIAG ANGIO   12/25/2021   PEG PLACEMENT N/A 01/05/2022   Procedure: PERCUTANEOUS ENDOSCOPIC GASTROSTOMY (PEG) PLACEMENT;  Surgeon: Paola Dreama SAILOR, MD;  Location: [facility] ENDOSCOPY;  Service: General;  Laterality: N/A;   RADIOLOGY WITH ANESTHESIA N/A 12/25/2021   Procedure: IR WITH ANESTHESIA;  Surgeon: Radiologist, Medication, MD;  Location: [facility] OR;  Service: Radiology;  Laterality: N/A;   TRANSURETHRAL RESECTION OF PROSTATE N/A 12/29/2021   Procedure: TRANSURETHRAL RESECTION OF THE PROSTATE (TURP);  Surgeon: Carolee Sherwood JONETTA DOUGLAS, MD;  Location: [facility] OR;  Service: Urology;  Laterality: N/A;   Patient Active Problem List   Diagnosis Date Noted   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/2024   Spastic hemiplegia affecting nondominant side (HCC) 07/29/2023   Encephalopathy acute 01/07/2022   Severe protein-calorie malnutrition 01/07/2022   Physical deconditioning 01/07/2022   Diarrhea 01/07/2022   Pericardial effusion    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   Prostate abscess 12/30/2021   Acute kidney injury 12/30/2021   Anemia due to GI blood loss    Acute respiratory failure with hypoxia (HCC)    Acute ischemic right MCA stroke (HCC) 12/25/2021   Sinus tachycardia    Chest pain in adult 10/24/2017   Mixed hyperlipidemia 10/24/2017   Rising PSA level 10/24/2017   Impaired fasting glucose 10/24/2017   Elevated BP without diagnosis of hypertension 10/24/2017    ONSET DATE: 04/20/2024 (referral date)   REFERRING DIAG: G81.10 (ICD-10-CM) - Spastic hemiplegia affecting nondominant side (HCC)  Recent botox  on 04/20/24:  FDS 50 FDP 50 PT 50 FCR 50  THERAPY DIAG:  Spastic  hemiplegia of left nondominant side as late effect of cerebral infarction (HCC)  Muscle weakness (generalized)  Other disturbances of skin sensation  Other symptoms and signs involving the nervous system  Other lack of coordination  Rationale for Evaluation and Treatment: Rehabilitation  SUBJECTIVE:   SUBJECTIVE STATEMENT: I  forgot my splint because my dog has CA and I have to put her down today. My Lt shoulder hurts about 2/10 from sleeping on the floor with my dog. (Pt became tearful due to dog's condition)  Pt accompanied by: self  PERTINENT HISTORY: CVA 12/25/2021, endocarditis, HTN, HLD   PRECAUTIONS: None  WEIGHT BEARING RESTRICTIONS: No  PAIN:  Are you having pain? No  FALLS: Has patient fallen in last 6 months? 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  HAND DOMINANCE: Right  ADLs: Overall ADLs: Mod I, slip on shoes Transfers/ambulation related to ADLs: independent  IADLs: Shopping: mod I  Light housekeeping: has cleaning lady 2x/wk for heavy cleaning (changes sheets, vacuum)  Meal Prep: mod I - microwave, some light cooking (may need A/E for safety)  Community mobility: driving Medication management: mod I using pillbox Financial management: mod I  Handwriting: no changes  MOBILITY STATUS: Independent  POSTURE COMMENTS:  No Significant postural limitations   UPPER EXTREMITY ROM:  RUE AROM WNL's LUE: shoulder flexion approx 75% limited end range, 90% elbow, 75% supination, wrist ext approx 50*. Pt remains in PIP flexion and only has extension at MP's.    HAND FUNCTION: Grip strength: Right: 79.5 lbs; Left: 4.4 lbs (assist placing hand around dynamometer)   COORDINATION: Box and Blocks:  Left unable   SENSATION: Can detect stimuli but unable to localize Lt hand.   EDEMA: moderate Lt forearm and minimal Lt hand - pt reports may be due to prednisone  from upper respiratory infection (bronchitis)  MUSCLE TONE: LUE: Mild and Hypertonic  COGNITION: Overall cognitive  status: Within functional limits for tasks assessed  VISION: Subjective report: mild Lt visual field from stroke Baseline vision: Wears glasses all the time Visual history: none   PERCEPTION: Not tested  PRAXIS: Not tested  OBSERVATIONS: Pt slightly tighter Lt hand than last episode of care, Pt also has new edema in forearm most likely due to prednisone  d/t recent URI                                                                                                                             TREATMENT DATE: 06/18/24    Pt forgot thumb spica splint today d/t personal reasons (dog sick)  Attempted to practice donning/doffing Oval 8 splints for Lt index and long finger but unable to  don correctly with one hand, therefore did not issue for home, but did practice 3 tip pinch for picking up 1 blocks using Oval 8s - pt still required mod assist/facilitation from therapist to open fingers sufficiently  Looked at previously issued HEP's from previous episodes of care - pt has not been doing them.  Condensed/consolidated HEP's for greater carryover and reviewed including: supine shoulder HEP from 08/16/23, new ex issued today for gross finger extension stretch (instructed to focus more on PIP extension instead of MP extension as pt tends to hyperextend MP's), and CIMT HEP issued on 12/22/23. Re-printed previous HEP's mentioned and high lighted activities pt could do using Lt hand for CIMT and crossed out activities pt cannot do with Lt hand.     PATIENT EDUCATION: Education details: SEE ABOVE Person educated: Patient Education method: Explanation Education comprehension: verbalized understanding  HOME EXERCISE PROGRAM: N/A   GOALS: Goals reviewed with patient? Yes  SHORT TERM GOALS: Target date: 06/16/24  Make adjustments to current resting hand splint prn (placing in MP flex, IP ext) Baseline: Goal status: MET   2.  Independent with daytime splint prn (consider thumb splint for  greater palmer abd and either dorsal finger splint or Oval 8 for PIP ext)  Baseline:  Goal status: IN PROGRESS  LONG TERM GOALS: Target date: 07/17/24  Independent with revised/updated HEP prn from last episode of care Baseline:  Goal status: IN PROGRESS  2.  Pt to achieve 2 blocks on Box & Blocks LUE Baseline: Unable, but could perform 2 last episode of care Goal status: INITIAL  3.  Pt to verbalize understanding of A/E to increase safety and ease with cooking tasks  Baseline:  Goal status: INITIAL   ASSESSMENT:  CLINICAL IMPRESSION: Patient seen today for occupational therapy treatment for Spastic hemiplegia from CVA in 2023.  Pt has met STG #1 and progressing to STG #2 AND LTG #1. Patient currently presents below baseline level of functioning demonstrating functional deficits and impairments as noted below. Pt would benefit from skilled OT services in the outpatient setting to work on impairments as noted below to help pt return to PLOF as able.   SABRA   PERFORMANCE DEFICITS: in functional skills including ADLs, IADLs, coordination, proprioception, sensation, edema, ROM, strength, pain, Fine motor control, Gross motor control, body mechanics, decreased knowledge of use of DME, and UE functional use.   IMPAIRMENTS: are limiting patient from ADLs, IADLs, leisure, and social participation.   CO-MORBIDITIES: may have co-morbidities  that affects occupational performance. Patient will benefit from skilled OT to address above impairments and improve overall function.  MODIFICATION OR ASSISTANCE TO COMPLETE EVALUATION: No modification of tasks or assist necessary to complete an evaluation.  OT OCCUPATIONAL PROFILE AND HISTORY: Problem focused assessment: Including review of records relating to presenting problem.  CLINICAL DECISION MAKING: LOW - limited treatment options, no task modification necessary  REHAB POTENTIAL: Good  EVALUATION COMPLEXITY: Low    PLAN:  OT FREQUENCY:  1x/week  OT DURATION: 8 weeks  PLANNED INTERVENTIONS: 97535 self care/ADL training, 02889 therapeutic exercise, 97530 therapeutic activity, 97112 neuromuscular re-education, 97140 manual therapy, 97018 paraffin, 02960 fluidotherapy, 97010 moist heat, 97032 electrical stimulation (manual), 97014 electrical stimulation unattended, 97760 Orthotic Initial, 97763 Orthotic/Prosthetic subsequent, patient/family education, and DME and/or AE instructions  RECOMMENDED OTHER SERVICES: none at this time  CONSULTED AND AGREED WITH PLAN OF CARE: Patient  PLAN FOR NEXT SESSION: progress towards remaining goals - if pt brings in dowel add built up handle and  coban for better grasp; UBE   Burnard JINNY Roads, OT 06/18/2024, 12:19 PM

## 2024-06-21 ENCOUNTER — Encounter: Admitting: Occupational Therapy

## 2024-06-26 ENCOUNTER — Encounter: Payer: Self-pay | Admitting: Occupational Therapy

## 2024-06-26 ENCOUNTER — Ambulatory Visit: Payer: Self-pay | Admitting: Occupational Therapy

## 2024-06-26 DIAGNOSIS — R29818 Other symptoms and signs involving the nervous system: Secondary | ICD-10-CM

## 2024-06-26 DIAGNOSIS — M6281 Muscle weakness (generalized): Secondary | ICD-10-CM

## 2024-06-26 DIAGNOSIS — R208 Other disturbances of skin sensation: Secondary | ICD-10-CM

## 2024-06-26 DIAGNOSIS — I69354 Hemiplegia and hemiparesis following cerebral infarction affecting left non-dominant side: Secondary | ICD-10-CM

## 2024-06-26 DIAGNOSIS — R278 Other lack of coordination: Secondary | ICD-10-CM

## 2024-06-26 NOTE — Therapy (Signed)
 OUTPATIENT OCCUPATIONAL THERAPY NEURO TREATMENT  Patient Name: Vincent Ferrell MRN: [id number] DOB:1961-03-16, 63 y.o., male Today's Date: 06/26/2024  PCP: Thelbert Eva SAUNDERS, DO  REFERRING PROVIDER: Carilyn Prentice BRAVO, MD  END OF SESSION:  OT End of Session - 06/26/24 0937     Visit Number 5    Number of Visits 8    Date for Recertification  07/17/24    Authorization Type MCR now primary, [employer]/C[employer] managed, VL: 90 PT/OT/SP    Progress Note Due on Visit 10    OT Start Time 0935    OT Stop Time 1015    OT Time Calculation (min) 40 min    Activity Tolerance Patient tolerated treatment well          Past Medical History:  Diagnosis Date   Chest pain in adult 10/24/2017   Elevated BP without diagnosis of hypertension 10/24/2017   GERD (gastroesophageal reflux disease)    Hypertension    Impaired fasting glucose 10/24/2017   Mixed hyperlipidemia 10/24/2017   Multifocal pneumonia 12/30/2021   Rising PSA level 10/24/2017   Stroke [facility])    Past Surgical History:  Procedure Laterality Date   COLONOSCOPY WITH PROPOFOL  N/A 12/28/2021   Procedure: COLONOSCOPY WITH PROPOFOL ;  Surgeon: Eda Iha, MD;  Location: [facility] ENDOSCOPY;  Service: Gastroenterology;  Laterality: N/A;   COLONOSCOPY WITH PROPOFOL  N/A 12/29/2021   Procedure: COLONOSCOPY WITH PROPOFOL ;  Surgeon: Eda Iha, MD;  Location: [facility] ENDOSCOPY;  Service: Gastroenterology;  Laterality: N/A;   ESOPHAGOGASTRODUODENOSCOPY (EGD) WITH PROPOFOL  N/A 12/29/2021   Procedure: ESOPHAGOGASTRODUODENOSCOPY (EGD) WITH PROPOFOL ;  Surgeon: Eda Iha, MD;  Location: [facility] ENDOSCOPY;  Service: Gastroenterology;  Laterality: N/A;   ESOPHAGOGASTRODUODENOSCOPY (EGD) WITH PROPOFOL   01/05/2022   Procedure: ESOPHAGOGASTRODUODENOSCOPY (EGD) WITH PROPOFOL ;  Surgeon: Paola Dreama SAILOR, MD;  Location: [facility] ENDOSCOPY;  Service: General;;   IR CT HEAD LTD  12/25/2021   IR PERCUTANEOUS ART THROMBECTOMY/INFUSION INTRACRANIAL INC DIAG ANGIO   12/25/2021   PEG PLACEMENT N/A 01/05/2022   Procedure: PERCUTANEOUS ENDOSCOPIC GASTROSTOMY (PEG) PLACEMENT;  Surgeon: Paola Dreama SAILOR, MD;  Location: [facility] ENDOSCOPY;  Service: General;  Laterality: N/A;   RADIOLOGY WITH ANESTHESIA N/A 12/25/2021   Procedure: IR WITH ANESTHESIA;  Surgeon: Radiologist, Medication, MD;  Location: [facility] OR;  Service: Radiology;  Laterality: N/A;   TRANSURETHRAL RESECTION OF PROSTATE N/A 12/29/2021   Procedure: TRANSURETHRAL RESECTION OF THE PROSTATE (TURP);  Surgeon: Carolee Sherwood JONETTA DOUGLAS, MD;  Location: [facility] OR;  Service: Urology;  Laterality: N/A;   Patient Active Problem List   Diagnosis Date Noted   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/2024   Spastic hemiplegia affecting nondominant side (HCC) 07/29/2023   Encephalopathy acute 01/07/2022   Severe protein-calorie malnutrition 01/07/2022   Physical deconditioning 01/07/2022   Diarrhea 01/07/2022   Pericardial effusion    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   Prostate abscess 12/30/2021   Acute kidney injury 12/30/2021   Anemia due to GI blood loss    Acute respiratory failure with hypoxia (HCC)    Acute ischemic right MCA stroke (HCC) 12/25/2021   Sinus tachycardia    Chest pain in adult 10/24/2017   Mixed hyperlipidemia 10/24/2017   Rising PSA level 10/24/2017   Impaired fasting glucose 10/24/2017   Elevated BP without diagnosis of hypertension 10/24/2017    ONSET DATE: 04/20/2024 (referral date)   REFERRING DIAG: G81.10 (ICD-10-CM) - Spastic hemiplegia affecting nondominant side (HCC)  Recent botox  on 04/20/24:  FDS 50 FDP 50 PT 50 FCR 50  THERAPY DIAG:  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 I went to [place]  for a high school reunion. Pt accompanied by: self  PERTINENT HISTORY: CVA 12/25/2021, endocarditis, HTN, HLD   PRECAUTIONS: None  WEIGHT BEARING RESTRICTIONS: No  PAIN:  Are you having pain? No  FALLS: Has patient fallen in last 6 months? 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  HAND DOMINANCE: Right  ADLs: Overall ADLs: Mod I, slip on shoes Transfers/ambulation related to ADLs: independent  IADLs: Shopping: mod I  Light housekeeping: has cleaning lady 2x/wk for heavy cleaning (changes sheets, vacuum)  Meal Prep: mod I - microwave, some light cooking (may need A/E for safety)  Community mobility: driving Medication management: mod I using pillbox Financial management: mod I  Handwriting: no changes  MOBILITY STATUS: Independent  POSTURE COMMENTS:  No Significant postural limitations   UPPER EXTREMITY ROM:  RUE AROM WNL's LUE: shoulder flexion approx 75% limited end range, 90% elbow, 75% supination, wrist ext approx 50*. Pt remains in PIP flexion and only has extension at MP's.    HAND FUNCTION: Grip strength: Right: 79.5 lbs; Left: 4.4 lbs (assist placing hand around dynamometer)   COORDINATION: Box and Blocks:  Left unable   SENSATION: Can detect stimuli but unable to localize Lt hand.   EDEMA: moderate Lt forearm and minimal Lt hand - pt reports may be due to prednisone  from upper respiratory infection (bronchitis)  MUSCLE TONE: LUE: Mild and Hypertonic  COGNITION: Overall cognitive status: Within functional limits for tasks assessed  VISION: Subjective report: mild Lt visual field from stroke Baseline  vision: Wears glasses all the time Visual history: none   PERCEPTION: Not tested  PRAXIS: Not tested  OBSERVATIONS: Pt slightly tighter Lt hand than last episode of care, Pt also has new edema in forearm most likely due to prednisone  d/t recent URI                                                                                                                             TREATMENT DATE: 06/26/24   Built up handle of dowel for shoulder HEP w/ coban wrap.  Then reviewed shoulder HEP from 08/2023 in supine w/ cues for correct positioning and to slow down. Pt reports greater ease holding with built up handle.   Practiced picking up 1 blocks for 3 pt pincer grasp and graded control of  grasp w/ thumb spica splint and Oval 8 ring splints on index and long finger PIP joints - pt able to do with only min assist from Rt hand to support wrist and position block. Pt then able to do same activity w/ same assist from Rt hand w/o splints but w/ increased compensations  Pt then practiced grasping and releasing cones and sliding along tabletop for shoulder and elbow involvement. Pt required assist from unaffected side and compensations Lt hand. Pt cued to use compensations into wrist flexion to allow release of hand.      PATIENT EDUCATION: Education details: SEE ABOVE Person educated: Patient Education method: Explanation Education comprehension: verbalized understanding  HOME EXERCISE PROGRAM: N/A   GOALS: Goals reviewed with patient? Yes  SHORT TERM GOALS: Target date: 06/16/24  Make adjustments to current resting hand splint prn (placing in MP flex, IP ext) Baseline: Goal status: MET   2.  Independent with daytime splint prn (consider thumb splint for greater palmer abd and either dorsal finger splint or Oval 8 for PIP ext)  Baseline:  Goal status: MET (However unable to issue Oval 8's as pt cannot don correctly himself)   LONG TERM GOALS: Target date: 07/17/24  Independent with  revised/updated HEP prn from last episode of care Baseline:  Goal status: MET   2.  Pt to achieve 2 blocks on Box & Blocks LUE Baseline: Unable, but could perform 2 last episode of care Goal status: INITIAL  3.  Pt to verbalize understanding of A/E to increase safety and ease with cooking tasks  Baseline:  Goal status: INITIAL   ASSESSMENT:  CLINICAL IMPRESSION: Patient seen today for occupational therapy treatment for Spastic hemiplegia from CVA in 2023.  Pt has met STG's and 1 LTG.  Patient currently presents below baseline level of functioning demonstrating functional deficits and impairments as noted below. Pt would benefit from skilled OT services in the outpatient setting to work on impairments as noted below to help pt return to PLOF as able.   SABRA   PERFORMANCE DEFICITS: in functional skills including ADLs, IADLs, coordination, proprioception, sensation, edema, ROM, strength, pain, Fine motor control, Gross motor control, body mechanics, decreased knowledge of use of DME, and UE functional use.   IMPAIRMENTS: are limiting patient from ADLs, IADLs, leisure, and social participation.   CO-MORBIDITIES: may have co-morbidities  that affects occupational performance. Patient will benefit from skilled OT to address above impairments and improve overall function.  MODIFICATION OR ASSISTANCE TO COMPLETE EVALUATION: No modification of tasks or assist necessary to complete an evaluation.  OT OCCUPATIONAL PROFILE AND HISTORY: Problem focused assessment: Including review of records relating to presenting problem.  CLINICAL DECISION MAKING: LOW - limited treatment options, no task modification necessary  REHAB POTENTIAL: Good  EVALUATION COMPLEXITY: Low    PLAN:  OT FREQUENCY: 1x/week  OT DURATION: 8 weeks  PLANNED INTERVENTIONS: 97535 self care/ADL training, 02889 therapeutic exercise, 97530 therapeutic activity, 97112 neuromuscular re-education, 97140 manual therapy, 97018  paraffin, 02960 fluidotherapy, 97010 moist heat, 97032 electrical stimulation (manual), 97014 electrical stimulation unattended, 97760 Orthotic Initial, 97763 Orthotic/Prosthetic subsequent, patient/family education, and DME and/or AE instructions  RECOMMENDED OTHER SERVICES: none at this time  CONSULTED AND AGREED WITH PLAN OF CARE: Patient  PLAN FOR NEXT SESSION: work on remaining goals, UBE (Pt has 2 appts remaining - anticipate d/c at that time, by 10/30)    Burnard JINNY Roads, OT 06/26/2024, 9:37 AM

## 2024-06-28 ENCOUNTER — Encounter: Admitting: Occupational Therapy

## 2024-07-05 ENCOUNTER — Encounter: Payer: Self-pay | Admitting: Occupational Therapy

## 2024-07-05 ENCOUNTER — Ambulatory Visit: Admitting: Occupational Therapy

## 2024-07-05 DIAGNOSIS — M6281 Muscle weakness (generalized): Secondary | ICD-10-CM

## 2024-07-05 DIAGNOSIS — R29818 Other symptoms and signs involving the nervous system: Secondary | ICD-10-CM

## 2024-07-05 DIAGNOSIS — I69354 Hemiplegia and hemiparesis following cerebral infarction affecting left non-dominant side: Secondary | ICD-10-CM

## 2024-07-05 DIAGNOSIS — R208 Other disturbances of skin sensation: Secondary | ICD-10-CM

## 2024-07-05 DIAGNOSIS — R278 Other lack of coordination: Secondary | ICD-10-CM

## 2024-07-05 NOTE — Therapy (Signed)
 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, VL: 90 PT/OT/SP    Progress Note Due on Visit 10    OT Start Time 1020    OT Stop Time 1100    OT Time Calculation (min) 40 min    Activity Tolerance Patient tolerated treatment well          Past Medical History:  Diagnosis Date   Chest pain in adult 10/24/2017   Elevated BP without diagnosis of hypertension 10/24/2017   GERD (gastroesophageal reflux disease)    Hypertension    Impaired fasting glucose 10/24/2017   Mixed hyperlipidemia 10/24/2017   Multifocal pneumonia 12/30/2021   Rising PSA level 10/24/2017   Stroke [facility])    Past Surgical History:  Procedure Laterality Date   COLONOSCOPY WITH PROPOFOL  N/A 12/28/2021   Procedure: COLONOSCOPY WITH PROPOFOL ;  Surgeon: Eda Iha, MD;  Location: [facility] ENDOSCOPY;  Service: Gastroenterology;  Laterality: N/A;   COLONOSCOPY WITH PROPOFOL  N/A 12/29/2021   Procedure: COLONOSCOPY WITH PROPOFOL ;  Surgeon: Eda Iha, MD;  Location: [facility] ENDOSCOPY;  Service: Gastroenterology;  Laterality: N/A;   ESOPHAGOGASTRODUODENOSCOPY (EGD) WITH PROPOFOL  N/A 12/29/2021   Procedure: ESOPHAGOGASTRODUODENOSCOPY (EGD) WITH PROPOFOL ;  Surgeon: Eda Iha, MD;  Location: [facility] ENDOSCOPY;  Service: Gastroenterology;  Laterality: N/A;   ESOPHAGOGASTRODUODENOSCOPY (EGD) WITH PROPOFOL   01/05/2022   Procedure: ESOPHAGOGASTRODUODENOSCOPY (EGD) WITH PROPOFOL ;  Surgeon: Paola Dreama SAILOR, MD;  Location: [facility] ENDOSCOPY;  Service: General;;   IR CT HEAD LTD  12/25/2021   IR PERCUTANEOUS ART THROMBECTOMY/INFUSION INTRACRANIAL INC DIAG ANGIO   12/25/2021   PEG PLACEMENT N/A 01/05/2022   Procedure: PERCUTANEOUS ENDOSCOPIC GASTROSTOMY (PEG) PLACEMENT;  Surgeon: Paola Dreama SAILOR, MD;  Location: [facility] ENDOSCOPY;  Service: General;  Laterality: N/A;   RADIOLOGY WITH ANESTHESIA N/A 12/25/2021   Procedure: IR WITH ANESTHESIA;  Surgeon: Radiologist, Medication, MD;  Location: [facility] OR;  Service: Radiology;  Laterality: N/A;   TRANSURETHRAL RESECTION OF PROSTATE N/A 12/29/2021   Procedure: TRANSURETHRAL RESECTION OF THE PROSTATE (TURP);  Surgeon: Carolee Sherwood JONETTA DOUGLAS, MD;  Location: [facility] OR;  Service: Urology;  Laterality: N/A;   Patient Active Problem List   Diagnosis Date Noted   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/2024   Spastic hemiplegia affecting nondominant side (HCC) 07/29/2023   Encephalopathy acute 01/07/2022   Severe protein-calorie malnutrition 01/07/2022   Physical deconditioning 01/07/2022   Diarrhea 01/07/2022   Pericardial effusion    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   Prostate abscess 12/30/2021   Acute kidney injury 12/30/2021   Anemia due to GI blood loss    Acute respiratory failure with hypoxia (HCC)    Acute ischemic right MCA stroke (HCC) 12/25/2021   Sinus tachycardia    Chest pain in adult 10/24/2017   Mixed hyperlipidemia 10/24/2017   Rising PSA level 10/24/2017   Impaired fasting glucose 10/24/2017   Elevated BP without diagnosis of hypertension 10/24/2017    ONSET DATE: 04/20/2024 (referral date)   REFERRING DIAG: G81.10 (ICD-10-CM) - Spastic hemiplegia affecting nondominant side (HCC)  Recent botox  on 04/20/24:  FDS 50 FDP 50 PT 50 FCR 50  THERAPY DIAG:  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 My small finger keeps coming out of my splint. The built up handle on my dowel stick is helping Pt accompanied by: self  PERTINENT HISTORY: CVA 12/25/2021, endocarditis, HTN, HLD   PRECAUTIONS: None  WEIGHT BEARING RESTRICTIONS: No  PAIN:  Are you having pain? No  FALLS: Has patient fallen in last 6 months? 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  HAND DOMINANCE: Right  ADLs: Overall ADLs: Mod I, slip on shoes Transfers/ambulation related to ADLs: independent  IADLs: Shopping: mod I  Light housekeeping: has cleaning lady 2x/wk for heavy cleaning (changes sheets, vacuum)  Meal Prep: mod I - microwave, some light cooking (may need A/E for safety)  Community mobility: driving Medication management: mod I using pillbox Financial management: mod I  Handwriting: no changes  MOBILITY STATUS: Independent  POSTURE COMMENTS:  No Significant postural limitations   UPPER EXTREMITY ROM:  RUE AROM WNL's LUE: shoulder flexion approx 75% limited end range, 90% elbow, 75% supination, wrist ext approx 50*. Pt remains in PIP flexion and only has extension at MP's.    HAND FUNCTION: Grip strength: Right: 79.5 lbs; Left: 4.4 lbs (assist placing hand around dynamometer)   COORDINATION: Box and Blocks:  Left unable   SENSATION: Can detect stimuli but unable to localize Lt hand.   EDEMA: moderate Lt forearm and minimal Lt hand - pt reports may be due to prednisone  from upper respiratory infection (bronchitis)  MUSCLE TONE: LUE: Mild and Hypertonic  COGNITION: Overall cognitive status: Within functional limits for tasks assessed  VISION: Subjective  report: mild Lt visual field from stroke Baseline vision: Wears glasses all the time Visual history: none   PERCEPTION: Not tested  PRAXIS: Not tested  OBSERVATIONS: Pt slightly tighter Lt hand than last episode of care, Pt also has new edema in forearm most likely due to prednisone  d/t recent URI                                                                                                                             TREATMENT DATE: 07/05/24   Pt reports small finger popping out between straps - adapted by lowering hooks so straps come down more proximal over fingers. Reviewed proper donning. Also replaced forearm and thumb straps  Pt issued handouts on recommended A/E for safety cooking including: one handed cutting  board, pot stabilizer, and one handed one touch can opener. Recommended first 2 for safety. Pt eager to get pot stabilizer but reports he doesn't cut fruits/veggies as much  UBE x 8 min, level 2.5 for normal reciprocal movement pattern and UB conditioning (Lt hand wrapped)      PATIENT EDUCATION: Education details: SEE ABOVE Person educated: Patient Education method: Explanation Education comprehension: verbalized understanding  HOME EXERCISE PROGRAM: N/A   GOALS: Goals reviewed with patient? Yes  SHORT TERM GOALS: Target date: 06/16/24  Make adjustments to current resting hand splint prn (placing in MP flex, IP ext) Baseline: Goal status: MET   2.  Independent with daytime splint prn (consider thumb splint for greater palmer abd and either dorsal finger splint or Oval 8 for PIP ext)  Baseline:  Goal status: MET (However unable to issue Oval 8's as pt cannot don correctly himself)   LONG TERM GOALS: Target date: 07/17/24  Independent with revised/updated HEP prn from last episode of care Baseline:  Goal status: MET   2.  Pt to achieve 2 blocks on Box & Blocks LUE Baseline: Unable, but could perform 2 last episode of care Goal status: INITIAL  3.   Pt to verbalize understanding of A/E to increase safety and ease with cooking tasks  Baseline:  Goal status: MET    ASSESSMENT:  CLINICAL IMPRESSION: Patient seen today for occupational therapy treatment for Spastic hemiplegia from CVA in 2023.  Pt has met STG's and 2 LTGs.  Anticipate d/c next visit .   PERFORMANCE DEFICITS: in functional skills including ADLs, IADLs, coordination, proprioception, sensation, edema, ROM, strength, pain, Fine motor control, Gross motor control, body mechanics, decreased knowledge of use of DME, and UE functional use.   IMPAIRMENTS: are limiting patient from ADLs, IADLs, leisure, and social participation.   CO-MORBIDITIES: may have co-morbidities  that affects occupational performance. Patient will benefit from skilled OT to address above impairments and improve overall function.  MODIFICATION OR ASSISTANCE TO COMPLETE EVALUATION: No modification of tasks or assist necessary to complete an evaluation.  OT OCCUPATIONAL PROFILE AND HISTORY: Problem focused assessment: Including review of records relating to presenting problem.  CLINICAL DECISION MAKING: LOW - limited treatment options, no task modification necessary  REHAB POTENTIAL: Good  EVALUATION COMPLEXITY: Low    PLAN:  OT FREQUENCY: 1x/week  OT DURATION: 8 weeks  PLANNED INTERVENTIONS: 97535 self care/ADL training, 02889 therapeutic exercise, 97530 therapeutic activity, 97112 neuromuscular re-education, 97140 manual therapy, 97018 paraffin, 02960 fluidotherapy, 97010 moist heat, 97032 electrical stimulation (manual), 97014 electrical stimulation unattended, 97760 Orthotic Initial, 97763 Orthotic/Prosthetic subsequent, patient/family education, and DME and/or AE instructions  RECOMMENDED OTHER SERVICES: none at this time  CONSULTED AND AGREED WITH PLAN OF CARE: Patient  PLAN FOR NEXT SESSION: check remaining goal and d/c next visit, pt may need longer finger straps since adjusting placement  of hooks   Burnard JINNY Roads, OT 07/05/2024, 10:35 AM

## 2024-07-10 ENCOUNTER — Encounter: Payer: Self-pay | Admitting: Occupational Therapy

## 2024-07-10 ENCOUNTER — Ambulatory Visit: Payer: Self-pay | Admitting: Occupational Therapy

## 2024-07-10 DIAGNOSIS — M6281 Muscle weakness (generalized): Secondary | ICD-10-CM

## 2024-07-10 DIAGNOSIS — R278 Other lack of coordination: Secondary | ICD-10-CM

## 2024-07-10 DIAGNOSIS — R29818 Other symptoms and signs involving the nervous system: Secondary | ICD-10-CM

## 2024-07-10 DIAGNOSIS — I69354 Hemiplegia and hemiparesis following cerebral infarction affecting left non-dominant side: Secondary | ICD-10-CM | POA: Diagnosis not present

## 2024-07-10 DIAGNOSIS — R208 Other disturbances of skin sensation: Secondary | ICD-10-CM

## 2024-07-10 NOTE — Therapy (Signed)
 OUTPATIENT OCCUPATIONAL THERAPY NEURO TREATMENT/DISCHARGE  Patient Name: Vincent Ferrell MRN: [id number] DOB:20-Oct-1960, 63 y.o., male Today's Date: 07/10/2024  PCP: Thelbert Eva SAUNDERS, DO  REFERRING PROVIDER: Carilyn Prentice BRAVO, MD   OCCUPATIONAL THERAPY DISCHARGE SUMMARY  Visits from Start of Care: 7  Current functional level related to goals / functional outcomes See below   Remaining deficits: Hemiparesis LUE  Impaired ability for PIP extension and thumb abduction which limits functional use of hand   Education / Equipment: Updates to HEP Modifications to resting hand splint and new thumb splint for daytime use as able A/E recommendations to increase safety with cooking     Patient agrees to discharge. Patient goals were partially met. Patient is being discharged due to maximized rehab potential at this time.     END OF SESSION:  OT End of Session - 07/10/24 1411     Visit Number 7    Number of Visits 8    Date for Recertification  07/17/24    Authorization Type MCR now primary, [employer] managed, VL: 90 PT/OT/SP    Progress Note Due on Visit 10    OT Start Time 1403    OT Stop Time 1445    OT Time Calculation (min) 42 min    Activity Tolerance Patient tolerated treatment well          Past Medical History:  Diagnosis Date   Chest pain in adult 10/24/2017   Elevated BP without diagnosis of hypertension 10/24/2017   GERD (gastroesophageal reflux disease)    Hypertension    Impaired fasting glucose 10/24/2017   Mixed hyperlipidemia 10/24/2017   Multifocal pneumonia 12/30/2021   Rising PSA level 10/24/2017   Stroke [facility])    Past Surgical History:  Procedure Laterality Date   COLONOSCOPY WITH PROPOFOL  N/A 12/28/2021   Procedure: COLONOSCOPY WITH PROPOFOL ;  Surgeon: Eda Iha, MD;  Location: [facility] ENDOSCOPY;  Service: Gastroenterology;  Laterality: N/A;   COLONOSCOPY WITH PROPOFOL  N/A 12/29/2021   Procedure: COLONOSCOPY WITH PROPOFOL ;  Surgeon:  Eda Iha, MD;  Location: [facility] ENDOSCOPY;  Service: Gastroenterology;  Laterality: N/A;   ESOPHAGOGASTRODUODENOSCOPY (EGD) WITH PROPOFOL  N/A 12/29/2021   Procedure: ESOPHAGOGASTRODUODENOSCOPY (EGD) WITH PROPOFOL ;  Surgeon: Eda Iha, MD;  Location: [facility] ENDOSCOPY;  Service: Gastroenterology;  Laterality: N/A;   ESOPHAGOGASTRODUODENOSCOPY (EGD) WITH PROPOFOL   01/05/2022   Procedure: ESOPHAGOGASTRODUODENOSCOPY (EGD) WITH PROPOFOL ;  Surgeon: Paola Dreama SAILOR, MD;  Location: [facility] ENDOSCOPY;  Service: General;;   IR CT HEAD LTD  12/25/2021   IR PERCUTANEOUS ART THROMBECTOMY/INFUSION INTRACRANIAL INC DIAG ANGIO  12/25/2021   PEG PLACEMENT N/A 01/05/2022   Procedure: PERCUTANEOUS ENDOSCOPIC GASTROSTOMY (PEG) PLACEMENT;  Surgeon: Paola Dreama SAILOR, MD;  Location: [facility] ENDOSCOPY;  Service: General;  Laterality: N/A;   RADIOLOGY WITH ANESTHESIA N/A 12/25/2021   Procedure: IR WITH ANESTHESIA;  Surgeon: Radiologist, Medication, MD;  Location: [facility] OR;  Service: Radiology;  Laterality: N/A;   TRANSURETHRAL RESECTION OF PROSTATE N/A 12/29/2021   Procedure: TRANSURETHRAL RESECTION OF THE PROSTATE (TURP);  Surgeon: Carolee Sherwood JONETTA DOUGLAS, MD;  Location: [facility] OR;  Service: Urology;  Laterality: N/A;   Patient Active Problem List   Diagnosis Date Noted   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/2024   Spastic hemiplegia affecting nondominant side (HCC) 07/29/2023   Encephalopathy acute 01/07/2022   Severe protein-calorie malnutrition 01/07/2022   Physical deconditioning 01/07/2022   Diarrhea 01/07/2022   Pericardial effusion    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   Prostate abscess 12/30/2021   Acute kidney injury 12/30/2021  Anemia due to GI blood loss    Acute respiratory failure with hypoxia (HCC)    Acute ischemic right MCA stroke (HCC) 12/25/2021   Sinus tachycardia    Chest pain in adult 10/24/2017   Mixed hyperlipidemia 10/24/2017   Rising PSA level 10/24/2017   Impaired fasting glucose 10/24/2017    Elevated BP without diagnosis of hypertension 10/24/2017    ONSET DATE: 04/20/2024 (referral date)   REFERRING DIAG: G81.10 (ICD-10-CM) - Spastic hemiplegia affecting nondominant side (HCC)  Recent botox  on 04/20/24:  FDS 50 FDP 50 PT 50 FCR 50  THERAPY DIAG:  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 No  FALLS: Has patient fallen in last 6 months? 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  HAND DOMINANCE: Right  ADLs: Overall ADLs: Mod I, slip on shoes Transfers/ambulation related to ADLs: independent  IADLs: Shopping: mod I  Light housekeeping: has cleaning lady 2x/wk for heavy cleaning (changes sheets, vacuum)  Meal Prep: mod I - microwave, some light cooking (may need A/E for safety)  Community mobility: driving Medication management: mod I using pillbox Financial management: mod I  Handwriting: no changes  MOBILITY STATUS: Independent  POSTURE COMMENTS:  No Significant postural limitations   UPPER EXTREMITY ROM:  RUE AROM WNL's LUE: shoulder flexion approx 75% limited end range, 90% elbow, 75% supination, wrist ext approx 50*. Pt  remains in PIP flexion and only has extension at MP's.    HAND FUNCTION: Grip strength: Right: 79.5 lbs; Left: 4.4 lbs (assist placing hand around dynamometer)   COORDINATION: Box and Blocks:  Left unable   SENSATION: Can detect stimuli but unable to localize Lt hand.   EDEMA: moderate Lt forearm and minimal Lt hand - pt reports may be due to prednisone  from upper respiratory infection (bronchitis)  MUSCLE TONE: LUE: Mild and Hypertonic  COGNITION: Overall cognitive status: Within functional limits for tasks assessed  VISION: Subjective report: mild Lt visual field from stroke Baseline vision: Wears glasses all the time Visual history: none   PERCEPTION: Not tested  PRAXIS: Not tested  OBSERVATIONS: Pt slightly tighter Lt hand than last episode of care, Pt also has new edema in forearm most likely due to prednisone  d/t recent URI  TREATMENT DATE: 07/10/24   Added longer straps w/ padding across fingers on resting hand splint, since adjusting hooks lower last session made straps too short.   Assessed remaining LTG - see below. Pt unable to do Box & Blocks LUE w/o assist from Rt hand and max compensations d/t insufficient PIP extension of fingers and decreased thumb palmer abduction  Attempted AA/ROM activity to facilitate PIP extension (using cards and washcloth), however pt still unable   Pt given printout of mirror for mirror therapy if this is something pt would like to practice at home.   Pt to see Dr. Carilyn in November for possibly more botox  - suggested increasing dosage to FDS if able. Recommended pt wait to be referred back to therapy until the following botox  in Feb/March of 2026  Pt also had questions about Vivistim, however don't think pt has sufficient finger extension or thumb movement to be a good candidate.   Reviewed how to  properly stretch PIP joints in extension while maintaining some flexion to MP joints    PATIENT EDUCATION: Education details: SEE ABOVE Person educated: Patient Education method: Explanation Education comprehension: verbalized understanding  HOME EXERCISE PROGRAM: N/A   GOALS: Goals reviewed with patient? Yes  SHORT TERM GOALS: Target date: 06/16/24  Make adjustments to current resting hand splint prn (placing in MP flex, IP ext) Baseline: Goal status: MET   2.  Independent with daytime splint prn (consider thumb splint for greater palmer abd and either dorsal finger splint or Oval 8 for PIP ext)  Baseline:  Goal status: MET (However unable to issue Oval 8's as pt cannot don correctly himself)   LONG TERM GOALS: Target date: 07/17/24  Independent with revised/updated HEP prn from last episode of care Baseline:  Goal status: MET   2.  Pt to achieve 2 blocks on Box & Blocks LUE Baseline: Unable, but could perform 2 last episode of care Goal status: NOT MET (Unable w/o assist from Rt hand)  3.  Pt to verbalize understanding of A/E to increase safety and ease with cooking tasks  Baseline:  Goal status: MET    ASSESSMENT:  CLINICAL IMPRESSION: Patient seen today for occupational therapy treatment for Spastic hemiplegia from CVA in 2023.  Pt has met STG's and 2/3 LTGs.  SABRA   PERFORMANCE DEFICITS: in functional skills including ADLs, IADLs, coordination, proprioception, sensation, edema, ROM, strength, pain, Fine motor control, Gross motor control, body mechanics, decreased knowledge of use of DME, and UE functional use.   IMPAIRMENTS: are limiting patient from ADLs, IADLs, leisure, and social participation.   CO-MORBIDITIES: may have co-morbidities  that affects occupational performance. Patient will benefit from skilled OT to address above impairments and improve overall function.  MODIFICATION OR ASSISTANCE TO COMPLETE EVALUATION: No modification of tasks or assist  necessary to complete an evaluation.  OT OCCUPATIONAL PROFILE AND HISTORY: Problem focused assessment: Including review of records relating to presenting problem.  CLINICAL DECISION MAKING: LOW - limited treatment options, no task modification necessary  REHAB POTENTIAL: Good  EVALUATION COMPLEXITY: Low    PLAN:  OT FREQUENCY: 1x/week  OT DURATION: 8 weeks  PLANNED INTERVENTIONS: 97535 self care/ADL training, 02889 therapeutic exercise, 97530 therapeutic activity, 97112 neuromuscular re-education, 97140 manual therapy, 97018 paraffin, 02960 fluidotherapy, 97010 moist heat, 97032 electrical stimulation (manual), 97014 electrical stimulation unattended, 97760 Orthotic Initial, [id number] Orthotic/Prosthetic subsequent, patient/family education, and DME and/or AE instructions  RECOMMENDED OTHER SERVICES: none at this time  CONSULTED AND AGREED WITH PLAN OF CARE: Patient  PLAN  D/C O.T.    Burnard JINNY Roads, OT 07/10/2024, 2:12 PM

## 2024-07-12 ENCOUNTER — Encounter: Admitting: Occupational Therapy

## 2024-07-19 ENCOUNTER — Encounter: Admitting: Occupational Therapy

## 2024-07-24 ENCOUNTER — Encounter: Payer: Self-pay | Admitting: Physical Medicine & Rehabilitation

## 2024-07-24 ENCOUNTER — Encounter: Attending: Physical Medicine & Rehabilitation | Admitting: Physical Medicine & Rehabilitation

## 2024-07-24 VITALS — BP 115/75 | HR 50 | Ht 74.0 in | Wt 229.0 lb

## 2024-07-24 DIAGNOSIS — I69354 Hemiplegia and hemiparesis following cerebral infarction affecting left non-dominant side: Secondary | ICD-10-CM | POA: Diagnosis not present

## 2024-07-24 DIAGNOSIS — G8110 Spastic hemiplegia affecting unspecified side: Secondary | ICD-10-CM | POA: Insufficient documentation

## 2024-07-24 MED ORDER — SODIUM CHLORIDE (PF) 0.9 % IJ SOLN
4.0000 mL | Freq: Once | INTRAMUSCULAR | Status: AC
  Administered 2024-07-24: 4 mL

## 2024-07-24 MED ORDER — ONABOTULINUMTOXINA 100 UNITS IJ SOLR
200.0000 [IU] | Freq: Once | INTRAMUSCULAR | Status: AC
  Administered 2024-07-24: 200 [IU] via INTRAMUSCULAR

## 2024-07-24 NOTE — Progress Notes (Signed)
 Botox  Injection for spasticity using needle EMG guidance  Dilution: 50 Units/ml Indication: Severe spasticity which interferes with ADL,mobility and/or  hygiene and is unresponsive to medication management and other conservative care Informed consent was obtained after describing risks and benefits of the procedure with the patient. This includes bleeding, bruising, infection, excessive weakness, or medication side effects. A REMS form is on file and signed. Needle: 27g 1" needle electrode Number of units per muscle Left   FDS 50 FDP 50 PT 50 FCR 50 All injections were done after obtaining appropriate EMG activity and after negative drawback for blood. The patient tolerated the procedure well. Post procedure instructions were given. A followup appointment was made.

## 2024-09-16 ENCOUNTER — Ambulatory Visit (HOSPITAL_COMMUNITY)

## 2024-09-16 ENCOUNTER — Ambulatory Visit: Admitting: Radiology

## 2024-09-16 ENCOUNTER — Encounter: Payer: Self-pay | Admitting: Emergency Medicine

## 2024-09-16 ENCOUNTER — Ambulatory Visit
Admission: EM | Admit: 2024-09-16 | Discharge: 2024-09-16 | Disposition: A | Discharge status: Home / Self Care | Attending: Internal Medicine | Admitting: Internal Medicine

## 2024-09-16 DIAGNOSIS — R051 Acute cough: Secondary | ICD-10-CM

## 2024-09-16 DIAGNOSIS — J22 Unspecified acute lower respiratory infection: Secondary | ICD-10-CM

## 2024-09-16 MED ORDER — PROMETHAZINE-DM 6.25-15 MG/5ML PO SYRP: 5.0000 mL | ORAL_SOLUTION | Freq: Three times a day (TID) | ORAL | 0 refills | Status: AC | PRN

## 2024-09-16 MED ORDER — AMOXICILLIN-POT CLAVULANATE 875-125 MG PO TABS: 1.0000 | ORAL_TABLET | Freq: Two times a day (BID) | ORAL | 0 refills | Status: AC

## 2024-09-16 MED ORDER — AZITHROMYCIN 250 MG PO TABS: ORAL_TABLET | ORAL | 0 refills | Status: AC

## 2024-09-16 NOTE — ED Triage Notes (Signed)
 Pt c/o cough, congestion, crackling sound in lungs while lying down, and not feeling well for 7 days.

## 2024-09-16 NOTE — ED Provider Notes (Signed)
 " [facility]    CSN: [id number] Arrival date & time: 09/16/24  1325      History   Chief Complaint Chief Complaint  Patient presents with   Cough    HPI Vincent Ferrell is a 64 y.o. male.   64 year old male who presents of urgent care with complaints of cough, congestion, fatigue.  His symptoms started about 7 days ago.  He has used NyQuil and DayQuil as well as ibuprofen.  He reports that he does not usually take Tylenol  as it does not work for him.  He reports last night he felt a croupy laying sensation in his chest when he was breathing and was concerned.  He denies any shortness of breath or chest pain.     Past Medical History:  Diagnosis Date   Chest pain in adult 10/24/2017   Elevated BP without diagnosis of hypertension 10/24/2017   GERD (gastroesophageal reflux disease)    Hypertension    Impaired fasting glucose 10/24/2017   Mixed hyperlipidemia 10/24/2017   Multifocal pneumonia 12/30/2021   Rising PSA level 10/24/2017   Stroke [facility])     Patient Active Problem List   Diagnosis Date Noted   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/2024   Spastic hemiplegia affecting nondominant side (HCC) 07/29/2023   Encephalopathy acute 01/07/2022   Severe protein-calorie malnutrition 01/07/2022   Physical deconditioning 01/07/2022   Diarrhea 01/07/2022   Pericardial effusion    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   Prostate abscess 12/30/2021   Acute kidney injury 12/30/2021   Anemia due to GI blood loss    Acute respiratory failure with hypoxia (HCC)    Acute ischemic right MCA stroke (HCC) 12/25/2021   Sinus tachycardia    Chest pain in adult 10/24/2017   Mixed hyperlipidemia 10/24/2017   Rising PSA level 10/24/2017   Impaired fasting glucose 10/24/2017   Elevated BP without diagnosis of hypertension 10/24/2017    Past Surgical History:  Procedure Laterality Date   COLONOSCOPY WITH PROPOFOL  N/A 12/28/2021   Procedure: COLONOSCOPY WITH PROPOFOL ;   Surgeon: Eda Iha, MD;  Location: [facility] ENDOSCOPY;  Service: Gastroenterology;  Laterality: N/A;   COLONOSCOPY WITH PROPOFOL  N/A 12/29/2021   Procedure: COLONOSCOPY WITH PROPOFOL ;  Surgeon: Eda Iha, MD;  Location: [facility] ENDOSCOPY;  Service: Gastroenterology;  Laterality: N/A;   ESOPHAGOGASTRODUODENOSCOPY (EGD) WITH PROPOFOL  N/A 12/29/2021   Procedure: ESOPHAGOGASTRODUODENOSCOPY (EGD) WITH PROPOFOL ;  Surgeon: Eda Iha, MD;  Location: [facility] ENDOSCOPY;  Service: Gastroenterology;  Laterality: N/A;   ESOPHAGOGASTRODUODENOSCOPY (EGD) WITH PROPOFOL   01/05/2022   Procedure: ESOPHAGOGASTRODUODENOSCOPY (EGD) WITH PROPOFOL ;  Surgeon: Paola Dreama SAILOR, MD;  Location: [facility] ENDOSCOPY;  Service: General;;   IR CT HEAD LTD  12/25/2021   IR PERCUTANEOUS ART THROMBECTOMY/INFUSION INTRACRANIAL INC DIAG ANGIO  12/25/2021   PEG PLACEMENT N/A 01/05/2022   Procedure: PERCUTANEOUS ENDOSCOPIC GASTROSTOMY (PEG) PLACEMENT;  Surgeon: Paola Dreama SAILOR, MD;  Location: [facility] ENDOSCOPY;  Service: General;  Laterality: N/A;   RADIOLOGY WITH ANESTHESIA N/A 12/25/2021   Procedure: IR WITH ANESTHESIA;  Surgeon: Radiologist, Medication, MD;  Location: [facility] OR;  Service: Radiology;  Laterality: N/A;   TRANSURETHRAL RESECTION OF PROSTATE N/A 12/29/2021   Procedure: TRANSURETHRAL RESECTION OF THE PROSTATE (TURP);  Surgeon: Carolee Sherwood JONETTA DOUGLAS, MD;  Location: [facility] OR;  Service: Urology;  Laterality: N/A;       Home Medications    Prior to Admission medications  Medication Sig Start Date End Date Taking? Authorizing Provider  amoxicillin -clavulanate (AUGMENTIN ) 875-125 MG tablet Take 1 tablet by  mouth every 12 (twelve) hours. 09/16/24  Yes Malaina Mortellaro A, PA-C  ascorbic acid (VITAMIN C) 500 MG tablet Take by mouth.    [provider]  aspirin EC 81 MG tablet Take by mouth.    [provider]  atorvastatin  (LIPITOR) 40 MG tablet Place 1 tablet (40 mg total) into feeding tube daily. 01/15/22   Tobie Yetta HERO, MD   azelastine  (ASTELIN ) 0.1 % nasal spray Place 1 spray into both nostrils 2 (two) times daily. Use in each nostril as directed 04/23/24   Reddick, Johnathan B, NP  azithromycin  (ZITHROMAX ) 250 MG tablet Take first 2 tablets together, then 1 every day until finished. 09/16/24  Yes Daianna Vasques A, PA-C  Baclofen 5 MG TABS Take 5 mg by mouth 3 (three) times daily. 10/15/22   [provider]  diclofenac Sodium (VOLTAREN ARTHRITIS PAIN) 1 % GEL Apply topically daily.    [provider]  Lactobacillus-Inulin (CULTURELLE DIGESTIVE DAILY PO) Take by mouth daily.    [provider]  lisinopril  (ZESTRIL ) 10 MG tablet Place 1 tablet (10 mg total) into feeding tube daily. 01/15/22   Patel, Pranav M, MD  MELATONIN PO Take by mouth as needed.    [provider]  metoprolol tartrate (LOPRESSOR) 25 MG tablet Take 25 mg by mouth 2 (two) times daily. 02/28/22   [provider]  Multiple Vitamin (MULTI-VITAMIN) tablet Take 1 tablet by mouth daily.    [provider]  pantoprazole  sodium (PROTONIX ) 40 mg Place 40 mg into feeding tube 2 (two) times daily. 01/15/22   Tobie Yetta HERO, MD  promethazine -dextromethorphan (PROMETHAZINE -DM) 6.25-15 MG/5ML syrup Take 5 mLs by mouth every 8 (eight) hours as needed for cough. 09/16/24  Yes Mcarthur Ivins A, PA-C  Sennosides (SENOKOT PO) Take by mouth daily at 2 PM.    [provider]  tamsulosin (FLOMAX) 0.4 MG CAPS capsule Take by mouth. 02/28/22   [provider]    Family History Family History  Problem Relation Age of Onset   Stroke Neg Hx     Social History Social History[1]   Allergies   Pollen extract and Shrimp (diagnostic)   Review of Systems Review of Systems  Constitutional:  Positive for fatigue. Negative for chills and fever.  HENT:  Positive for congestion. Negative for ear pain and sore throat.   Eyes:  Negative for pain and visual disturbance.  Respiratory:  Positive for cough.  Negative for shortness of breath.   Cardiovascular:  Negative for chest pain and palpitations.  Gastrointestinal:  Negative for abdominal pain and vomiting.  Genitourinary:  Negative for dysuria and hematuria.  Musculoskeletal:  Negative for arthralgias and back pain.  Skin:  Negative for color change and rash.  Neurological:  Negative for seizures and syncope.  All other systems reviewed and are negative.    Physical Exam Triage Vital Signs ED Triage Vitals [09/16/24 1337]  Encounter Vitals Group     BP 122/79     Girls Systolic BP Percentile      Girls Diastolic BP Percentile      Boys Systolic BP Percentile      Boys Diastolic BP Percentile      Pulse Rate 69     Resp 14     Temp 97.9 F (36.6 C)     Temp Source Oral     SpO2 91 %     Weight      Height      Head Circumference  Peak Flow      Pain Score      Pain Loc      Pain Education      Exclude from Growth Chart    No data found.  Updated Vital Signs BP 122/79 (BP Location: Right Arm)   Pulse 69   Temp 97.9 F (36.6 C) (Oral)   Resp 14   SpO2 91%   Visual Acuity Right Eye Distance:   Left Eye Distance:   Bilateral Distance:    Right Eye Near:   Left Eye Near:    Bilateral Near:     Physical Exam Vitals and nursing note reviewed.  Constitutional:      General: He is not in acute distress.    Appearance: He is well-developed.  HENT:     Head: Normocephalic and atraumatic.  Eyes:     Conjunctiva/sclera: Conjunctivae normal.  Cardiovascular:     Rate and Rhythm: Normal rate and regular rhythm.     Heart sounds: No murmur heard. Pulmonary:     Effort: Pulmonary effort is normal. No respiratory distress.     Breath sounds: Examination of the right-upper field reveals wheezing and rhonchi. Examination of the left-upper field reveals wheezing. Examination of the right-middle field reveals rhonchi. Examination of the right-lower field reveals rhonchi. Wheezing and rhonchi present. No decreased  breath sounds.  Abdominal:     Palpations: Abdomen is soft.     Tenderness: There is no abdominal tenderness.  Musculoskeletal:        General: No swelling.     Cervical back: Neck supple.  Skin:    General: Skin is warm and dry.     Capillary Refill: Capillary refill takes less than 2 seconds.  Neurological:     Mental Status: He is alert.  Psychiatric:        Mood and Affect: Mood normal.      UC Treatments / Results  Labs (all labs ordered are listed, but only abnormal results are displayed) Labs Reviewed - No data to display  EKG   Radiology DG Chest 2 View Result Date: 09/16/2024 EXAM: 2 VIEW(S) XRAY OF THE CHEST 09/16/2024 01:57:58 PM COMPARISON: 04/30/2024 CLINICAL HISTORY: Cough. FINDINGS: LUNGS AND PLEURA: No focal pulmonary opacity. No pleural effusion. No pneumothorax. HEART AND MEDIASTINUM: Calcified aorta. No acute abnormality of the cardiac and mediastinal silhouettes. BONES AND SOFT TISSUES: Thoracic degenerative changes. No acute osseous abnormality. IMPRESSION: 1. No acute process. Electronically signed by: Waddell Calk MD 09/16/2024 02:05 PM EST RP Workstation: [id number]    Procedures Procedures (including critical care time)  Medications Ordered in UC Medications - No data to display  Initial Impression / Assessment and Plan / UC Course  I have reviewed the triage vital signs and the nursing notes.  Pertinent labs & imaging results that were available during my care of the patient were reviewed by me and considered in my medical decision making (see chart for details).     Lower respiratory infection  Acute cough - Plan: DG Chest 2 View, DG Chest 2 View   Chest x-ray done today.  Although final evaluation by the radiologist does not show any acute abnormalites, physical exam findings and symptoms are concerning for possible respiratory infection.  Due to the physical exam findings, duration and symptoms we will treat with the following: Augmentin   875 mg twice daily for 7 days.  This is an antibiotic.  Take this with food. Azithromycin  250mg  Take 2 tablets today and the 1 tablet  daily for 4 more days.  Promethazine  DM 5 mL every 8 hours as needed for cough.  Use caution as this medication can cause drowsiness.   Make sure to stay hydrated by drinking plenty of water . Return to urgent care or PCP if symptoms worsen or fail to resolve.    Final Clinical Impressions(s) / UC Diagnoses   Final diagnoses:  Acute cough  Lower respiratory infection     Discharge Instructions      Chest x-ray done today.  Although final evaluation by the radiologist does not show any acute abnormalites, physical exam findings and symptoms are concerning for possible respiratory infection.  Due to the physical exam findings, duration and symptoms we will treat with the following: Augmentin  875 mg twice daily for 7 days.  This is an antibiotic.  Take this with food. Azithromycin  250mg  Take 2 tablets today and the 1 tablet daily for 4 more days.  Promethazine  DM 5 mL every 8 hours as needed for cough.  Use caution as this medication can cause drowsiness.   Make sure to stay hydrated by drinking plenty of water . Return to urgent care or PCP if symptoms worsen or fail to resolve.       ED Prescriptions     Medication Sig Dispense Auth. Provider   amoxicillin -clavulanate (AUGMENTIN ) 875-125 MG tablet Take 1 tablet by mouth every 12 (twelve) hours. 14 tablet Deatra Mcmahen A, PA-C   azithromycin  (ZITHROMAX ) 250 MG tablet Take first 2 tablets together, then 1 every day until finished. 6 tablet Adeliz Tonkinson A, PA-C   promethazine -dextromethorphan (PROMETHAZINE -DM) 6.25-15 MG/5ML syrup Take 5 mLs by mouth every 8 (eight) hours as needed for cough. 180 mL Teresa Almarie LABOR, [place]      PDMP not reviewed this encounter.    [1]  Social History Tobacco Use   Smoking status: Never    Passive exposure: Never   Smokeless tobacco: Never  Vaping Use    Vaping status: Never Used  Substance Use Topics   Alcohol use: Yes    Comment: occ   Drug use: Never     Teresa Almarie LABOR, PA-C 09/16/24 1417  "

## 2024-09-16 NOTE — Discharge Instructions (Addendum)
 Chest x-ray done today.  Although final evaluation by the radiologist does not show any acute abnormalites, physical exam findings and symptoms are concerning for possible respiratory infection.  Due to the physical exam findings, duration and symptoms we will treat with the following: Augmentin  875 mg twice daily for 7 days.  This is an antibiotic.  Take this with food. Azithromycin  250mg  Take 2 tablets today and the 1 tablet daily for 4 more days.  Promethazine  DM 5 mL every 8 hours as needed for cough.  Use caution as this medication can cause drowsiness.   Make sure to stay hydrated by drinking plenty of water . Return to urgent care or PCP if symptoms worsen or fail to resolve.

## 2024-10-25 ENCOUNTER — Encounter: Admitting: Physical Medicine & Rehabilitation
